# Patient Record
Sex: Female | Born: 1935 | Race: White | Hispanic: No | Smoking: Never smoker
Health system: Southern US, Community
[De-identification: ages and names within clinical notes are randomized; demographics above are authoritative.]

## PROBLEM LIST (undated history)

## (undated) DIAGNOSIS — I5042 Chronic combined systolic (congestive) and diastolic (congestive) heart failure: Secondary | ICD-10-CM

## (undated) DIAGNOSIS — E7800 Pure hypercholesterolemia, unspecified: Secondary | ICD-10-CM

## (undated) DIAGNOSIS — M545 Low back pain, unspecified: Secondary | ICD-10-CM

## (undated) DIAGNOSIS — Z9289 Personal history of other medical treatment: Secondary | ICD-10-CM

## (undated) DIAGNOSIS — Q453 Other congenital malformations of pancreas and pancreatic duct: Secondary | ICD-10-CM

## (undated) DIAGNOSIS — Z8673 Personal history of transient ischemic attack (TIA), and cerebral infarction without residual deficits: Secondary | ICD-10-CM

## (undated) DIAGNOSIS — I2510 Atherosclerotic heart disease of native coronary artery without angina pectoris: Secondary | ICD-10-CM

## (undated) DIAGNOSIS — I10 Essential (primary) hypertension: Secondary | ICD-10-CM

## (undated) DIAGNOSIS — N3946 Mixed incontinence: Secondary | ICD-10-CM

## (undated) DIAGNOSIS — I4891 Unspecified atrial fibrillation: Secondary | ICD-10-CM

## (undated) DIAGNOSIS — I340 Nonrheumatic mitral (valve) insufficiency: Secondary | ICD-10-CM

## (undated) DIAGNOSIS — F411 Generalized anxiety disorder: Secondary | ICD-10-CM

## (undated) DIAGNOSIS — N280 Ischemia and infarction of kidney: Secondary | ICD-10-CM

## (undated) DIAGNOSIS — R040 Epistaxis: Secondary | ICD-10-CM

## (undated) DIAGNOSIS — M810 Age-related osteoporosis without current pathological fracture: Secondary | ICD-10-CM

## (undated) DIAGNOSIS — R413 Other amnesia: Secondary | ICD-10-CM

## (undated) DIAGNOSIS — E119 Type 2 diabetes mellitus without complications: Secondary | ICD-10-CM

## (undated) DIAGNOSIS — K449 Diaphragmatic hernia without obstruction or gangrene: Secondary | ICD-10-CM

## (undated) DIAGNOSIS — D126 Benign neoplasm of colon, unspecified: Secondary | ICD-10-CM

## (undated) DIAGNOSIS — M1990 Unspecified osteoarthritis, unspecified site: Secondary | ICD-10-CM

## (undated) HISTORY — DX: Mixed incontinence: N39.46

## (undated) HISTORY — DX: Other amnesia: R41.3

## (undated) HISTORY — DX: Unspecified osteoarthritis, unspecified site: M19.90

## (undated) HISTORY — DX: Unspecified atrial fibrillation: I48.91

## (undated) HISTORY — DX: Pure hypercholesterolemia, unspecified: E78.00

## (undated) HISTORY — PX: CHOLECYSTECTOMY: SHX55

## (undated) HISTORY — DX: Diaphragmatic hernia without obstruction or gangrene: K44.9

## (undated) HISTORY — DX: Benign neoplasm of colon, unspecified: D12.6

## (undated) HISTORY — DX: Low back pain: M54.5

## (undated) HISTORY — DX: Personal history of other medical treatment: Z92.89

## (undated) HISTORY — PX: OTHER SURGICAL HISTORY: SHX169

## (undated) HISTORY — DX: Type 2 diabetes mellitus without complications: E11.9

## (undated) HISTORY — DX: Ischemia and infarction of kidney: N28.0

## (undated) HISTORY — DX: Generalized anxiety disorder: F41.1

## (undated) HISTORY — DX: Low back pain, unspecified: M54.50

## (undated) HISTORY — DX: Essential (primary) hypertension: I10

## (undated) HISTORY — DX: Age-related osteoporosis without current pathological fracture: M81.0

## (undated) HISTORY — DX: Personal history of transient ischemic attack (TIA), and cerebral infarction without residual deficits: Z86.73

---

## 1998-05-26 ENCOUNTER — Ambulatory Visit (HOSPITAL_COMMUNITY): Admission: RE | Admit: 1998-05-26 | Discharge: 1998-05-26 | Payer: Self-pay | Admitting: Internal Medicine

## 1998-05-26 ENCOUNTER — Encounter: Payer: Self-pay | Admitting: Internal Medicine

## 1999-02-02 ENCOUNTER — Encounter: Payer: Self-pay | Admitting: Internal Medicine

## 1999-02-02 ENCOUNTER — Ambulatory Visit (HOSPITAL_COMMUNITY): Admission: RE | Admit: 1999-02-02 | Discharge: 1999-02-02 | Payer: Self-pay | Admitting: Internal Medicine

## 1999-08-18 ENCOUNTER — Ambulatory Visit: Admission: RE | Admit: 1999-08-18 | Discharge: 1999-08-18 | Payer: Self-pay | Admitting: Internal Medicine

## 2000-02-03 ENCOUNTER — Ambulatory Visit: Admission: RE | Admit: 2000-02-03 | Discharge: 2000-02-03 | Payer: Self-pay | Admitting: Internal Medicine

## 2000-03-17 ENCOUNTER — Ambulatory Visit (HOSPITAL_COMMUNITY): Admission: RE | Admit: 2000-03-17 | Discharge: 2000-03-17 | Payer: Self-pay | Admitting: Internal Medicine

## 2000-04-08 ENCOUNTER — Encounter: Payer: Self-pay | Admitting: Pulmonary Disease

## 2000-04-08 ENCOUNTER — Encounter: Admission: RE | Admit: 2000-04-08 | Discharge: 2000-04-08 | Payer: Self-pay | Admitting: Pulmonary Disease

## 2001-05-11 ENCOUNTER — Encounter: Payer: Self-pay | Admitting: Pulmonary Disease

## 2001-05-11 ENCOUNTER — Ambulatory Visit (HOSPITAL_COMMUNITY): Admission: RE | Admit: 2001-05-11 | Discharge: 2001-05-11 | Payer: Self-pay | Admitting: Pulmonary Disease

## 2002-02-28 ENCOUNTER — Ambulatory Visit (HOSPITAL_COMMUNITY): Admission: RE | Admit: 2002-02-28 | Discharge: 2002-02-28 | Payer: Self-pay | Admitting: Pulmonary Disease

## 2002-02-28 ENCOUNTER — Encounter: Payer: Self-pay | Admitting: Pulmonary Disease

## 2002-03-20 ENCOUNTER — Ambulatory Visit (HOSPITAL_COMMUNITY): Admission: RE | Admit: 2002-03-20 | Discharge: 2002-03-20 | Payer: Self-pay | Admitting: Gastroenterology

## 2002-03-20 ENCOUNTER — Encounter: Payer: Self-pay | Admitting: Gastroenterology

## 2002-11-18 ENCOUNTER — Ambulatory Visit (HOSPITAL_COMMUNITY): Admission: RE | Admit: 2002-11-18 | Discharge: 2002-11-18 | Payer: Self-pay | Admitting: Gastroenterology

## 2003-05-21 ENCOUNTER — Ambulatory Visit (HOSPITAL_COMMUNITY): Admission: RE | Admit: 2003-05-21 | Discharge: 2003-05-21 | Payer: Self-pay | Admitting: Orthopedic Surgery

## 2003-08-11 ENCOUNTER — Inpatient Hospital Stay (HOSPITAL_COMMUNITY): Admission: AD | Admit: 2003-08-11 | Discharge: 2003-08-14 | Payer: Self-pay | Admitting: Internal Medicine

## 2003-10-02 ENCOUNTER — Ambulatory Visit (HOSPITAL_BASED_OUTPATIENT_CLINIC_OR_DEPARTMENT_OTHER): Admission: RE | Admit: 2003-10-02 | Discharge: 2003-10-02 | Payer: Self-pay | Admitting: Family Medicine

## 2003-10-10 ENCOUNTER — Ambulatory Visit: Payer: Self-pay | Admitting: Internal Medicine

## 2003-11-27 ENCOUNTER — Ambulatory Visit: Payer: Self-pay | Admitting: Internal Medicine

## 2003-12-15 ENCOUNTER — Ambulatory Visit: Payer: Self-pay | Admitting: Cardiology

## 2004-01-08 ENCOUNTER — Ambulatory Visit: Payer: Self-pay | Admitting: *Deleted

## 2004-01-23 ENCOUNTER — Ambulatory Visit: Payer: Self-pay | Admitting: Pulmonary Disease

## 2004-02-05 ENCOUNTER — Ambulatory Visit: Payer: Self-pay | Admitting: Cardiology

## 2004-02-20 ENCOUNTER — Ambulatory Visit: Payer: Self-pay | Admitting: Cardiology

## 2004-02-20 ENCOUNTER — Ambulatory Visit: Payer: Self-pay | Admitting: Internal Medicine

## 2004-02-26 ENCOUNTER — Ambulatory Visit: Payer: Self-pay | Admitting: Internal Medicine

## 2004-03-10 ENCOUNTER — Ambulatory Visit: Payer: Self-pay | Admitting: Internal Medicine

## 2004-03-19 ENCOUNTER — Ambulatory Visit: Payer: Self-pay | Admitting: Internal Medicine

## 2004-04-02 ENCOUNTER — Ambulatory Visit: Payer: Self-pay | Admitting: Cardiology

## 2004-04-16 ENCOUNTER — Ambulatory Visit: Payer: Self-pay | Admitting: Internal Medicine

## 2004-05-07 ENCOUNTER — Ambulatory Visit: Payer: Self-pay | Admitting: Internal Medicine

## 2004-06-04 ENCOUNTER — Ambulatory Visit: Payer: Self-pay | Admitting: Cardiology

## 2004-06-25 ENCOUNTER — Ambulatory Visit: Payer: Self-pay | Admitting: Cardiology

## 2004-06-29 ENCOUNTER — Ambulatory Visit: Payer: Self-pay | Admitting: Pulmonary Disease

## 2004-07-09 ENCOUNTER — Ambulatory Visit: Payer: Self-pay | Admitting: Cardiology

## 2004-07-30 ENCOUNTER — Ambulatory Visit: Payer: Self-pay | Admitting: Cardiology

## 2004-08-19 ENCOUNTER — Ambulatory Visit: Payer: Self-pay | Admitting: Internal Medicine

## 2004-08-27 ENCOUNTER — Ambulatory Visit: Payer: Self-pay

## 2004-09-08 ENCOUNTER — Ambulatory Visit: Payer: Self-pay | Admitting: *Deleted

## 2004-09-17 ENCOUNTER — Ambulatory Visit: Payer: Self-pay | Admitting: Internal Medicine

## 2004-09-17 ENCOUNTER — Ambulatory Visit: Payer: Self-pay | Admitting: Cardiology

## 2004-09-30 ENCOUNTER — Ambulatory Visit: Payer: Self-pay | Admitting: Cardiology

## 2004-10-08 ENCOUNTER — Ambulatory Visit: Payer: Self-pay | Admitting: Internal Medicine

## 2004-10-11 ENCOUNTER — Ambulatory Visit: Payer: Self-pay | Admitting: Pulmonary Disease

## 2004-10-21 ENCOUNTER — Ambulatory Visit: Payer: Self-pay | Admitting: Cardiology

## 2004-10-26 ENCOUNTER — Ambulatory Visit: Payer: Self-pay | Admitting: Pulmonary Disease

## 2004-11-04 ENCOUNTER — Ambulatory Visit: Payer: Self-pay | Admitting: Cardiology

## 2004-12-07 ENCOUNTER — Ambulatory Visit: Payer: Self-pay | Admitting: Pulmonary Disease

## 2004-12-22 ENCOUNTER — Ambulatory Visit: Payer: Self-pay | Admitting: Internal Medicine

## 2004-12-30 ENCOUNTER — Ambulatory Visit: Payer: Self-pay | Admitting: Cardiology

## 2005-02-01 ENCOUNTER — Ambulatory Visit: Payer: Self-pay | Admitting: *Deleted

## 2005-03-01 ENCOUNTER — Ambulatory Visit: Payer: Self-pay | Admitting: Cardiovascular Disease

## 2005-03-09 ENCOUNTER — Ambulatory Visit: Payer: Self-pay | Admitting: Pulmonary Disease

## 2005-03-29 ENCOUNTER — Ambulatory Visit: Payer: Self-pay | Admitting: Cardiology

## 2005-04-12 ENCOUNTER — Ambulatory Visit: Payer: Self-pay | Admitting: Cardiology

## 2005-04-26 ENCOUNTER — Ambulatory Visit: Payer: Self-pay | Admitting: Cardiology

## 2005-05-24 ENCOUNTER — Ambulatory Visit: Payer: Self-pay | Admitting: Cardiology

## 2005-06-22 ENCOUNTER — Ambulatory Visit: Payer: Self-pay | Admitting: Pulmonary Disease

## 2005-06-23 ENCOUNTER — Ambulatory Visit: Payer: Self-pay | Admitting: Pulmonary Disease

## 2005-07-21 ENCOUNTER — Ambulatory Visit: Payer: Self-pay | Admitting: Cardiology

## 2005-08-09 ENCOUNTER — Ambulatory Visit: Payer: Self-pay | Admitting: Cardiology

## 2005-08-23 ENCOUNTER — Ambulatory Visit: Payer: Self-pay | Admitting: Cardiology

## 2005-09-29 ENCOUNTER — Ambulatory Visit: Payer: Self-pay | Admitting: Cardiology

## 2005-10-14 ENCOUNTER — Ambulatory Visit: Payer: Self-pay | Admitting: Cardiovascular Disease

## 2005-11-01 ENCOUNTER — Ambulatory Visit: Payer: Self-pay | Admitting: Pulmonary Disease

## 2005-11-21 ENCOUNTER — Ambulatory Visit: Payer: Self-pay | Admitting: Cardiovascular Disease

## 2005-12-12 ENCOUNTER — Ambulatory Visit: Payer: Self-pay | Admitting: Internal Medicine

## 2005-12-28 ENCOUNTER — Ambulatory Visit: Payer: Self-pay | Admitting: *Deleted

## 2006-01-09 ENCOUNTER — Ambulatory Visit: Payer: Self-pay | Admitting: Cardiovascular Disease

## 2006-01-30 ENCOUNTER — Ambulatory Visit: Payer: Self-pay | Admitting: Pulmonary Disease

## 2006-01-30 LAB — CONVERTED CEMR LAB
ALT: 21 units/L (ref 0–40)
AST: 27 units/L (ref 0–37)
Alkaline Phosphatase: 72 units/L (ref 39–117)
BUN: 7 mg/dL (ref 6–23)
Calcium: 9.7 mg/dL (ref 8.4–10.5)
GFR calc non Af Amer: 65 mL/min
Glucose, Bld: 211 mg/dL — ABNORMAL HIGH (ref 70–99)
Hgb A1c MFr Bld: 8.4 % — ABNORMAL HIGH (ref 4.6–6.0)
Potassium: 4.4 meq/L (ref 3.5–5.1)
Total Protein: 6.9 g/dL (ref 6.0–8.3)

## 2006-02-02 ENCOUNTER — Ambulatory Visit: Payer: Self-pay | Admitting: Internal Medicine

## 2006-02-02 ENCOUNTER — Ambulatory Visit: Payer: Self-pay | Admitting: Cardiology

## 2006-02-06 ENCOUNTER — Ambulatory Visit: Payer: Self-pay | Admitting: Internal Medicine

## 2006-02-27 ENCOUNTER — Ambulatory Visit: Payer: Self-pay | Admitting: Internal Medicine

## 2006-03-02 ENCOUNTER — Ambulatory Visit: Payer: Self-pay | Admitting: Pulmonary Disease

## 2006-03-14 ENCOUNTER — Ambulatory Visit: Payer: Self-pay | Admitting: Cardiology

## 2006-03-28 ENCOUNTER — Ambulatory Visit: Payer: Self-pay | Admitting: Cardiology

## 2006-04-25 ENCOUNTER — Ambulatory Visit: Payer: Self-pay | Admitting: *Deleted

## 2006-05-23 ENCOUNTER — Ambulatory Visit: Payer: Self-pay | Admitting: Cardiology

## 2006-06-02 ENCOUNTER — Ambulatory Visit: Payer: Self-pay | Admitting: Internal Medicine

## 2006-06-29 ENCOUNTER — Ambulatory Visit: Payer: Self-pay | Admitting: Cardiology

## 2006-07-19 ENCOUNTER — Ambulatory Visit: Payer: Self-pay | Admitting: Cardiology

## 2006-07-24 ENCOUNTER — Ambulatory Visit: Payer: Self-pay | Admitting: Pulmonary Disease

## 2006-07-24 LAB — CONVERTED CEMR LAB
Creatinine, Ser: 0.6 mg/dL (ref 0.4–1.2)
Hgb A1c MFr Bld: 8.5 % — ABNORMAL HIGH (ref 4.6–6.0)
Potassium: 4.8 meq/L (ref 3.5–5.1)
Sodium: 141 meq/L (ref 135–145)

## 2006-08-14 ENCOUNTER — Ambulatory Visit: Payer: Self-pay | Admitting: Internal Medicine

## 2006-09-04 ENCOUNTER — Ambulatory Visit: Payer: Self-pay | Admitting: Cardiology

## 2006-10-12 ENCOUNTER — Ambulatory Visit: Payer: Self-pay | Admitting: Cardiology

## 2006-10-27 ENCOUNTER — Ambulatory Visit: Payer: Self-pay | Admitting: Cardiology

## 2006-11-14 ENCOUNTER — Ambulatory Visit: Payer: Self-pay | Admitting: Cardiology

## 2006-12-12 ENCOUNTER — Ambulatory Visit: Payer: Self-pay | Admitting: Cardiology

## 2006-12-26 ENCOUNTER — Ambulatory Visit: Payer: Self-pay | Admitting: Internal Medicine

## 2007-01-18 ENCOUNTER — Ambulatory Visit: Payer: Self-pay | Admitting: Internal Medicine

## 2007-01-23 DIAGNOSIS — I10 Essential (primary) hypertension: Secondary | ICD-10-CM

## 2007-01-24 ENCOUNTER — Ambulatory Visit: Payer: Self-pay | Admitting: Pulmonary Disease

## 2007-02-11 LAB — CONVERTED CEMR LAB
AST: 21 units/L (ref 0–37)
Bilirubin, Direct: 0.2 mg/dL (ref 0.0–0.3)
Chloride: 102 meq/L (ref 96–112)
Creatinine, Ser: 0.7 mg/dL (ref 0.4–1.2)
Eosinophils Relative: 1.7 % (ref 0.0–5.0)
Glucose, Bld: 148 mg/dL — ABNORMAL HIGH (ref 70–99)
Hgb A1c MFr Bld: 7.1 % — ABNORMAL HIGH (ref 4.6–6.0)
Lymphocytes Relative: 18.2 % (ref 12.0–46.0)
MCHC: 35.5 g/dL (ref 30.0–36.0)
Monocytes Absolute: 0.6 10*3/uL (ref 0.2–0.7)
Neutrophils Relative %: 69.6 % (ref 43.0–77.0)
Potassium: 3.5 meq/L (ref 3.5–5.1)
Pro B Natriuretic peptide (BNP): 332 pg/mL — ABNORMAL HIGH (ref 0.0–100.0)
RBC: 3.89 M/uL (ref 3.87–5.11)
RDW: 12.8 % (ref 11.5–14.6)
Sodium: 140 meq/L (ref 135–145)
Total Bilirubin: 0.9 mg/dL (ref 0.3–1.2)
Total Protein: 6.8 g/dL (ref 6.0–8.3)

## 2007-02-14 ENCOUNTER — Telehealth: Payer: Self-pay | Admitting: Pulmonary Disease

## 2007-02-20 ENCOUNTER — Telehealth (INDEPENDENT_AMBULATORY_CARE_PROVIDER_SITE_OTHER): Payer: Self-pay | Admitting: *Deleted

## 2007-02-21 ENCOUNTER — Ambulatory Visit: Payer: Self-pay | Admitting: Pulmonary Disease

## 2007-02-21 ENCOUNTER — Encounter: Payer: Self-pay | Admitting: Adult Health

## 2007-02-21 DIAGNOSIS — N3000 Acute cystitis without hematuria: Secondary | ICD-10-CM

## 2007-02-21 LAB — CONVERTED CEMR LAB
Crystals: NEGATIVE
Mucus, UA: NEGATIVE
Nitrite: POSITIVE — AB
Urine Glucose: NEGATIVE mg/dL
Urobilinogen, UA: 0.2 (ref 0.0–1.0)

## 2007-02-27 ENCOUNTER — Ambulatory Visit: Payer: Self-pay | Admitting: Cardiology

## 2007-03-27 ENCOUNTER — Ambulatory Visit: Payer: Self-pay | Admitting: Cardiology

## 2007-04-13 ENCOUNTER — Ambulatory Visit: Payer: Self-pay | Admitting: Cardiology

## 2007-04-19 ENCOUNTER — Ambulatory Visit: Payer: Self-pay | Admitting: Internal Medicine

## 2007-04-23 ENCOUNTER — Telehealth (INDEPENDENT_AMBULATORY_CARE_PROVIDER_SITE_OTHER): Payer: Self-pay | Admitting: *Deleted

## 2007-05-03 ENCOUNTER — Ambulatory Visit: Payer: Self-pay | Admitting: Internal Medicine

## 2007-05-22 ENCOUNTER — Ambulatory Visit: Payer: Self-pay | Admitting: Internal Medicine

## 2007-05-31 ENCOUNTER — Ambulatory Visit: Payer: Self-pay | Admitting: Internal Medicine

## 2007-06-27 ENCOUNTER — Ambulatory Visit: Payer: Self-pay | Admitting: Internal Medicine

## 2007-07-06 ENCOUNTER — Ambulatory Visit: Payer: Self-pay | Admitting: Cardiology

## 2007-07-23 ENCOUNTER — Ambulatory Visit: Payer: Self-pay | Admitting: Cardiology

## 2007-07-24 ENCOUNTER — Ambulatory Visit: Payer: Self-pay | Admitting: Pulmonary Disease

## 2007-07-25 ENCOUNTER — Ambulatory Visit: Payer: Self-pay | Admitting: Pulmonary Disease

## 2007-07-29 DIAGNOSIS — R413 Other amnesia: Secondary | ICD-10-CM

## 2007-07-30 LAB — CONVERTED CEMR LAB
ALT: 20 units/L (ref 0–35)
AST: 24 units/L (ref 0–37)
Basophils Absolute: 0 10*3/uL (ref 0.0–0.1)
Basophils Relative: 0.8 % (ref 0.0–1.0)
Bilirubin, Direct: 0.2 mg/dL (ref 0.0–0.3)
CO2: 31 meq/L (ref 19–32)
Chloride: 107 meq/L (ref 96–112)
Cholesterol: 165 mg/dL (ref 0–200)
LDL Cholesterol: 80 mg/dL (ref 0–99)
Lymphocytes Relative: 27.1 % (ref 12.0–46.0)
MCHC: 34.9 g/dL (ref 30.0–36.0)
Neutrophils Relative %: 58.8 % (ref 43.0–77.0)
RBC: 4.15 M/uL (ref 3.87–5.11)
RDW: 13.6 % (ref 11.5–14.6)
Sodium: 143 meq/L (ref 135–145)
Total Bilirubin: 1.3 mg/dL — ABNORMAL HIGH (ref 0.3–1.2)
Total CHOL/HDL Ratio: 2.8
VLDL: 26 mg/dL (ref 0–40)

## 2007-08-30 ENCOUNTER — Ambulatory Visit: Payer: Self-pay | Admitting: Internal Medicine

## 2007-09-20 ENCOUNTER — Ambulatory Visit: Payer: Self-pay | Admitting: Cardiology

## 2007-10-15 ENCOUNTER — Ambulatory Visit: Payer: Self-pay | Admitting: Internal Medicine

## 2007-11-13 ENCOUNTER — Ambulatory Visit: Payer: Self-pay | Admitting: Internal Medicine

## 2007-12-10 ENCOUNTER — Ambulatory Visit: Payer: Self-pay | Admitting: Pulmonary Disease

## 2007-12-10 ENCOUNTER — Telehealth (INDEPENDENT_AMBULATORY_CARE_PROVIDER_SITE_OTHER): Payer: Self-pay | Admitting: *Deleted

## 2007-12-11 ENCOUNTER — Encounter: Payer: Self-pay | Admitting: Pulmonary Disease

## 2007-12-14 ENCOUNTER — Ambulatory Visit: Payer: Self-pay | Admitting: Internal Medicine

## 2007-12-18 LAB — CONVERTED CEMR LAB
Ketones, ur: NEGATIVE mg/dL
Specific Gravity, Urine: 1.01 (ref 1.000–1.03)
Total Protein, Urine: 100 mg/dL — AB
Urine Glucose: NEGATIVE mg/dL
pH: 6 (ref 5.0–8.0)

## 2008-01-03 ENCOUNTER — Ambulatory Visit: Payer: Self-pay | Admitting: Cardiovascular Disease

## 2008-02-04 ENCOUNTER — Ambulatory Visit: Payer: Self-pay | Admitting: Cardiology

## 2008-02-27 ENCOUNTER — Ambulatory Visit: Payer: Self-pay | Admitting: Cardiology

## 2008-03-18 ENCOUNTER — Ambulatory Visit: Payer: Self-pay | Admitting: Cardiology

## 2008-03-25 ENCOUNTER — Telehealth: Payer: Self-pay | Admitting: Pulmonary Disease

## 2008-04-01 ENCOUNTER — Ambulatory Visit: Payer: Self-pay | Admitting: Cardiovascular Disease

## 2008-04-03 ENCOUNTER — Telehealth (INDEPENDENT_AMBULATORY_CARE_PROVIDER_SITE_OTHER): Payer: Self-pay | Admitting: *Deleted

## 2008-04-03 ENCOUNTER — Ambulatory Visit: Payer: Self-pay | Admitting: Adult Health

## 2008-04-03 DIAGNOSIS — R21 Rash and other nonspecific skin eruption: Secondary | ICD-10-CM

## 2008-04-16 ENCOUNTER — Ambulatory Visit: Payer: Self-pay | Admitting: Pulmonary Disease

## 2008-04-22 ENCOUNTER — Ambulatory Visit: Payer: Self-pay | Admitting: Cardiology

## 2008-04-29 ENCOUNTER — Ambulatory Visit: Payer: Self-pay | Admitting: Cardiology

## 2008-04-29 LAB — CONVERTED CEMR LAB
ALT: 18 units/L (ref 0–35)
Albumin: 3.8 g/dL (ref 3.5–5.2)
BUN: 10 mg/dL (ref 6–23)
Basophils Relative: 0.9 % (ref 0.0–3.0)
Chloride: 107 meq/L (ref 96–112)
Eosinophils Relative: 1.3 % (ref 0.0–5.0)
HCT: 41.7 % (ref 36.0–46.0)
Hgb A1c MFr Bld: 8 % — ABNORMAL HIGH (ref 4.6–6.5)
Lymphs Abs: 1.1 10*3/uL (ref 0.7–4.0)
MCV: 97.1 fL (ref 78.0–100.0)
Monocytes Absolute: 0.5 10*3/uL (ref 0.1–1.0)
Neutro Abs: 3.6 10*3/uL (ref 1.4–7.7)
Platelets: 192 10*3/uL (ref 150.0–400.0)
Potassium: 4.4 meq/L (ref 3.5–5.1)
TSH: 2.4 microintl units/mL (ref 0.35–5.50)
Total Bilirubin: 1.1 mg/dL (ref 0.3–1.2)
Total Protein: 7 g/dL (ref 6.0–8.3)
WBC: 5.3 10*3/uL (ref 4.5–10.5)

## 2008-05-06 ENCOUNTER — Telehealth: Payer: Self-pay | Admitting: Internal Medicine

## 2008-05-09 ENCOUNTER — Encounter: Payer: Self-pay | Admitting: Internal Medicine

## 2008-05-12 ENCOUNTER — Ambulatory Visit: Payer: Self-pay | Admitting: Cardiology

## 2008-05-26 ENCOUNTER — Telehealth (INDEPENDENT_AMBULATORY_CARE_PROVIDER_SITE_OTHER): Payer: Self-pay | Admitting: *Deleted

## 2008-05-27 ENCOUNTER — Ambulatory Visit: Payer: Self-pay | Admitting: Internal Medicine

## 2008-06-10 ENCOUNTER — Encounter: Payer: Self-pay | Admitting: *Deleted

## 2008-06-12 ENCOUNTER — Ambulatory Visit: Payer: Self-pay | Admitting: Internal Medicine

## 2008-07-03 ENCOUNTER — Ambulatory Visit: Payer: Self-pay | Admitting: Internal Medicine

## 2008-07-16 ENCOUNTER — Encounter: Payer: Self-pay | Admitting: *Deleted

## 2008-07-18 ENCOUNTER — Encounter (INDEPENDENT_AMBULATORY_CARE_PROVIDER_SITE_OTHER): Payer: Self-pay | Admitting: Cardiology

## 2008-07-18 ENCOUNTER — Ambulatory Visit: Payer: Self-pay | Admitting: Cardiology

## 2008-07-18 LAB — CONVERTED CEMR LAB
POC INR: 3.8
Prothrombin Time: 23.5 s

## 2008-08-08 ENCOUNTER — Ambulatory Visit: Payer: Self-pay | Admitting: Cardiology

## 2008-08-08 LAB — CONVERTED CEMR LAB: POC INR: 2.2

## 2008-09-05 ENCOUNTER — Telehealth: Payer: Self-pay | Admitting: Adult Health

## 2008-09-05 ENCOUNTER — Ambulatory Visit: Payer: Self-pay | Admitting: Cardiovascular Disease

## 2008-09-05 LAB — CONVERTED CEMR LAB: POC INR: 1.6

## 2008-09-08 ENCOUNTER — Ambulatory Visit: Payer: Self-pay | Admitting: Pulmonary Disease

## 2008-09-08 DIAGNOSIS — K121 Other forms of stomatitis: Secondary | ICD-10-CM | POA: Insufficient documentation

## 2008-09-08 DIAGNOSIS — K1230 Oral mucositis (ulcerative), unspecified: Secondary | ICD-10-CM

## 2008-09-09 ENCOUNTER — Telehealth: Payer: Self-pay | Admitting: Adult Health

## 2008-09-19 ENCOUNTER — Ambulatory Visit: Payer: Self-pay | Admitting: Internal Medicine

## 2008-09-19 LAB — CONVERTED CEMR LAB: POC INR: 2.5

## 2008-10-14 ENCOUNTER — Ambulatory Visit: Payer: Self-pay | Admitting: Pulmonary Disease

## 2008-10-18 LAB — CONVERTED CEMR LAB
AST: 24 units/L (ref 0–37)
Albumin: 4.5 g/dL (ref 3.5–5.2)
Alkaline Phosphatase: 49 units/L (ref 39–117)
Basophils Absolute: 0 10*3/uL (ref 0.0–0.1)
Basophils Relative: 0.6 % (ref 0.0–3.0)
Bilirubin Urine: NEGATIVE
CO2: 30 meq/L (ref 19–32)
Glucose, Bld: 124 mg/dL — ABNORMAL HIGH (ref 70–99)
HCT: 40.7 % (ref 36.0–46.0)
Hemoglobin, Urine: NEGATIVE
Hemoglobin: 14.1 g/dL (ref 12.0–15.0)
Ketones, ur: NEGATIVE mg/dL
Lymphs Abs: 1 10*3/uL (ref 0.7–4.0)
MCHC: 34.5 g/dL (ref 30.0–36.0)
Monocytes Relative: 8.1 % (ref 3.0–12.0)
Neutro Abs: 4 10*3/uL (ref 1.4–7.7)
Potassium: 4.1 meq/L (ref 3.5–5.1)
RBC: 4.24 M/uL (ref 3.87–5.11)
RDW: 13.1 % (ref 11.5–14.6)
Sodium: 138 meq/L (ref 135–145)
TSH: 2.38 microintl units/mL (ref 0.35–5.50)
Total CHOL/HDL Ratio: 2
Total Protein, Urine: NEGATIVE mg/dL
Total Protein: 7.4 g/dL (ref 6.0–8.3)
Urine Glucose: NEGATIVE mg/dL
Urobilinogen, UA: 0.2 (ref 0.0–1.0)
Vit D, 25-Hydroxy: 32 ng/mL (ref 30–89)

## 2008-11-03 ENCOUNTER — Telehealth: Payer: Self-pay | Admitting: Pulmonary Disease

## 2008-11-05 ENCOUNTER — Encounter: Payer: Self-pay | Admitting: *Deleted

## 2008-11-07 ENCOUNTER — Telehealth: Payer: Self-pay | Admitting: Pulmonary Disease

## 2008-11-12 ENCOUNTER — Ambulatory Visit: Payer: Self-pay | Admitting: Cardiology

## 2008-11-12 LAB — CONVERTED CEMR LAB: POC INR: 2

## 2008-12-16 ENCOUNTER — Ambulatory Visit: Payer: Self-pay | Admitting: Cardiovascular Disease

## 2008-12-16 ENCOUNTER — Encounter (INDEPENDENT_AMBULATORY_CARE_PROVIDER_SITE_OTHER): Payer: Self-pay | Admitting: Cardiology

## 2008-12-16 LAB — CONVERTED CEMR LAB: POC INR: 1.6

## 2009-01-05 ENCOUNTER — Telehealth (INDEPENDENT_AMBULATORY_CARE_PROVIDER_SITE_OTHER): Payer: Self-pay | Admitting: *Deleted

## 2009-01-15 ENCOUNTER — Ambulatory Visit: Payer: Self-pay | Admitting: Cardiovascular Disease

## 2009-01-15 LAB — CONVERTED CEMR LAB: POC INR: 2.6

## 2009-01-22 ENCOUNTER — Telehealth: Payer: Self-pay | Admitting: Internal Medicine

## 2009-01-29 ENCOUNTER — Ambulatory Visit: Payer: Self-pay | Admitting: Internal Medicine

## 2009-02-05 ENCOUNTER — Emergency Department (HOSPITAL_COMMUNITY): Admission: EM | Admit: 2009-02-05 | Discharge: 2009-02-05 | Payer: Self-pay | Admitting: Emergency Medicine

## 2009-02-12 ENCOUNTER — Ambulatory Visit: Payer: Self-pay | Admitting: Cardiology

## 2009-02-12 ENCOUNTER — Encounter (INDEPENDENT_AMBULATORY_CARE_PROVIDER_SITE_OTHER): Payer: Self-pay | Admitting: Cardiology

## 2009-03-13 ENCOUNTER — Ambulatory Visit: Payer: Self-pay | Admitting: Cardiology

## 2009-04-13 ENCOUNTER — Ambulatory Visit: Payer: Self-pay | Admitting: Pulmonary Disease

## 2009-04-14 ENCOUNTER — Ambulatory Visit: Payer: Self-pay | Admitting: Internal Medicine

## 2009-04-19 LAB — CONVERTED CEMR LAB
ALT: 16 units/L (ref 0–35)
Alkaline Phosphatase: 66 units/L (ref 39–117)
Basophils Absolute: 0 10*3/uL (ref 0.0–0.1)
Basophils Relative: 0.6 % (ref 0.0–3.0)
Bilirubin, Direct: 0.2 mg/dL (ref 0.0–0.3)
CO2: 30 meq/L (ref 19–32)
Chloride: 102 meq/L (ref 96–112)
Cholesterol: 207 mg/dL — ABNORMAL HIGH (ref 0–200)
Creatinine, Ser: 0.6 mg/dL (ref 0.4–1.2)
Eosinophils Absolute: 0.1 10*3/uL (ref 0.0–0.7)
Hgb A1c MFr Bld: 6.8 % — ABNORMAL HIGH (ref 4.6–6.5)
MCHC: 34.5 g/dL (ref 30.0–36.0)
MCV: 95.8 fL (ref 78.0–100.0)
Monocytes Absolute: 0.4 10*3/uL (ref 0.1–1.0)
Neutro Abs: 4.5 10*3/uL (ref 1.4–7.7)
Neutrophils Relative %: 74.7 % (ref 43.0–77.0)
Potassium: 4.4 meq/L (ref 3.5–5.1)
RBC: 4.1 M/uL (ref 3.87–5.11)
RDW: 14.7 % — ABNORMAL HIGH (ref 11.5–14.6)
Sodium: 141 meq/L (ref 135–145)
Total Bilirubin: 1.2 mg/dL (ref 0.3–1.2)
Total CHOL/HDL Ratio: 2
Total Protein: 6.8 g/dL (ref 6.0–8.3)
VLDL: 20.8 mg/dL (ref 0.0–40.0)

## 2009-04-20 ENCOUNTER — Telehealth (INDEPENDENT_AMBULATORY_CARE_PROVIDER_SITE_OTHER): Payer: Self-pay | Admitting: *Deleted

## 2009-04-24 ENCOUNTER — Ambulatory Visit: Payer: Self-pay | Admitting: Internal Medicine

## 2009-05-13 ENCOUNTER — Ambulatory Visit: Payer: Self-pay | Admitting: Cardiology

## 2009-05-13 LAB — CONVERTED CEMR LAB: POC INR: 2.6

## 2009-05-26 ENCOUNTER — Encounter: Payer: Self-pay | Admitting: Pulmonary Disease

## 2009-06-12 ENCOUNTER — Ambulatory Visit: Payer: Self-pay | Admitting: Cardiovascular Disease

## 2009-06-12 LAB — CONVERTED CEMR LAB: POC INR: 2

## 2009-06-16 ENCOUNTER — Telehealth: Payer: Self-pay | Admitting: Internal Medicine

## 2009-06-18 ENCOUNTER — Telehealth: Payer: Self-pay | Admitting: Internal Medicine

## 2009-06-19 ENCOUNTER — Telehealth: Payer: Self-pay | Admitting: Internal Medicine

## 2009-07-01 ENCOUNTER — Telehealth (INDEPENDENT_AMBULATORY_CARE_PROVIDER_SITE_OTHER): Payer: Self-pay | Admitting: *Deleted

## 2009-07-14 ENCOUNTER — Ambulatory Visit: Payer: Self-pay | Admitting: Internal Medicine

## 2009-07-27 ENCOUNTER — Telehealth: Payer: Self-pay | Admitting: Pulmonary Disease

## 2009-07-28 ENCOUNTER — Ambulatory Visit: Payer: Self-pay | Admitting: Adult Health

## 2009-07-28 LAB — CONVERTED CEMR LAB
Casts: NONE SEEN /lpf
Crystals: NONE SEEN
Squamous Epithelial / LPF: NONE SEEN /lpf

## 2009-08-06 ENCOUNTER — Telehealth (INDEPENDENT_AMBULATORY_CARE_PROVIDER_SITE_OTHER): Payer: Self-pay | Admitting: *Deleted

## 2009-08-06 ENCOUNTER — Ambulatory Visit: Payer: Self-pay | Admitting: Pulmonary Disease

## 2009-08-06 DIAGNOSIS — R630 Anorexia: Secondary | ICD-10-CM

## 2009-08-07 LAB — CONVERTED CEMR LAB
Basophils Absolute: 0 10*3/uL (ref 0.0–0.1)
CO2: 30 meq/L (ref 19–32)
Calcium: 8.6 mg/dL (ref 8.4–10.5)
Creatinine, Ser: 0.7 mg/dL (ref 0.4–1.2)
Eosinophils Absolute: 0.1 10*3/uL (ref 0.0–0.7)
Folate: 17.8 ng/mL
GFR calc non Af Amer: 82.18 mL/min (ref >60)
HCT: 39.3 % (ref 36.0–46.0)
Hemoglobin: 13.4 g/dL (ref 12.0–15.0)
Lymphs Abs: 1.2 10*3/uL (ref 0.7–4.0)
MCHC: 34.2 g/dL (ref 30.0–36.0)
MCV: 97 fL (ref 78.0–100.0)
Monocytes Absolute: 0.5 10*3/uL (ref 0.1–1.0)
Neutro Abs: 3.6 10*3/uL (ref 1.4–7.7)
Platelets: 238 10*3/uL (ref 150.0–400.0)
RDW: 14.5 % (ref 11.5–14.6)
Vitamin B-12: 135 pg/mL — ABNORMAL LOW (ref 211–911)

## 2009-08-13 ENCOUNTER — Ambulatory Visit: Payer: Self-pay | Admitting: Internal Medicine

## 2009-08-13 ENCOUNTER — Ambulatory Visit: Payer: Self-pay | Admitting: Cardiovascular Disease

## 2009-08-13 LAB — CONVERTED CEMR LAB: POC INR: 3.1

## 2009-09-08 ENCOUNTER — Encounter: Payer: Self-pay | Admitting: Pulmonary Disease

## 2009-09-16 ENCOUNTER — Ambulatory Visit: Payer: Self-pay | Admitting: Cardiovascular Disease

## 2009-09-16 LAB — CONVERTED CEMR LAB: POC INR: 3.2

## 2009-09-28 ENCOUNTER — Telehealth (INDEPENDENT_AMBULATORY_CARE_PROVIDER_SITE_OTHER): Payer: Self-pay | Admitting: *Deleted

## 2009-09-30 ENCOUNTER — Ambulatory Visit: Payer: Self-pay | Admitting: Cardiology

## 2009-09-30 LAB — CONVERTED CEMR LAB: POC INR: 3.3

## 2009-10-13 ENCOUNTER — Ambulatory Visit: Payer: Self-pay | Admitting: Pulmonary Disease

## 2009-10-14 ENCOUNTER — Ambulatory Visit: Payer: Self-pay | Admitting: Cardiovascular Disease

## 2009-10-15 LAB — CONVERTED CEMR LAB
BUN: 14 mg/dL (ref 6–23)
CO2: 27 meq/L (ref 19–32)
Chloride: 98 meq/L (ref 96–112)
Hgb A1c MFr Bld: 8.3 % — ABNORMAL HIGH (ref 4.6–6.5)
Potassium: 4.7 meq/L (ref 3.5–5.1)

## 2009-10-23 ENCOUNTER — Telehealth (INDEPENDENT_AMBULATORY_CARE_PROVIDER_SITE_OTHER): Payer: Self-pay | Admitting: *Deleted

## 2009-10-26 ENCOUNTER — Telehealth (INDEPENDENT_AMBULATORY_CARE_PROVIDER_SITE_OTHER): Payer: Self-pay | Admitting: *Deleted

## 2009-11-16 ENCOUNTER — Ambulatory Visit: Payer: Self-pay | Admitting: Internal Medicine

## 2009-11-19 ENCOUNTER — Telehealth (INDEPENDENT_AMBULATORY_CARE_PROVIDER_SITE_OTHER): Payer: Self-pay | Admitting: *Deleted

## 2009-11-27 ENCOUNTER — Ambulatory Visit: Payer: Self-pay | Admitting: Cardiovascular Disease

## 2009-11-27 ENCOUNTER — Inpatient Hospital Stay (HOSPITAL_COMMUNITY): Admission: EM | Admit: 2009-11-27 | Discharge: 2009-11-29 | Payer: Self-pay | Admitting: Emergency Medicine

## 2009-11-28 ENCOUNTER — Encounter (INDEPENDENT_AMBULATORY_CARE_PROVIDER_SITE_OTHER): Payer: Self-pay | Admitting: Neurology

## 2009-11-29 ENCOUNTER — Encounter (INDEPENDENT_AMBULATORY_CARE_PROVIDER_SITE_OTHER): Payer: Self-pay | Admitting: Neurology

## 2009-12-14 ENCOUNTER — Ambulatory Visit: Payer: Self-pay | Admitting: Internal Medicine

## 2009-12-14 LAB — CONVERTED CEMR LAB: POC INR: 3.1

## 2009-12-28 ENCOUNTER — Ambulatory Visit: Payer: Self-pay | Admitting: Cardiology

## 2010-01-12 ENCOUNTER — Telehealth (INDEPENDENT_AMBULATORY_CARE_PROVIDER_SITE_OTHER): Payer: Self-pay | Admitting: *Deleted

## 2010-01-13 LAB — CONVERTED CEMR LAB: POC INR: 2.4

## 2010-02-02 ENCOUNTER — Encounter: Payer: Self-pay | Admitting: Pulmonary Disease

## 2010-02-09 NOTE — Medication Information (Signed)
Summary: ROV.MP  Anticoagulant Therapy  Managed by: Bethena Midget, RN, BSN Referring MD: Dietrich Pates Supervising MD: Excell Seltzer MD, Casimiro Needle Indication 1: Atrial Fibrillation (ICD-427.31) Lab Used: [facility]  Site: [address] INR POC 2.6 INR RANGE 2 - 3  Dietary changes: no    Health status changes: no    Bleeding/hemorrhagic complications: no    Recent/future hospitalizations: no    Any changes in medication regimen? no    Recent/future dental: no  Any missed doses?: no       Is patient compliant with meds? yes       Allergies: 1)  ! Sulfa 2)  ! Lyrica (Pregabalin) 3)  ! Zithromax (Azithromycin) 4)  Sulfamethoxazole (Sulfamethoxazole)  Anticoagulation Management History:      The patient is taking warfarin and comes in today for a routine follow up visit.  Positive risk factors for bleeding include an age of 75 years or older and presence of serious comorbidities.  The bleeding index is 'intermediate risk'.  Positive CHADS2 values include History of CHF, History of HTN, Age > 76 years old, and History of Diabetes.  The start date was 04/01/1998.  Her last INR was 5.9 ratio.  Anticoagulation responsible provider: Excell Seltzer MD, Casimiro Needle.  INR POC: 2.6.  Cuvette Lot#: [id number].  Exp: 02/2010.    Anticoagulation Management Assessment/Plan:      The patient's current anticoagulation dose is Coumadin 5 mg tabs: Take as directed by coumadin clinic..  The target INR is 2.0-3.0.  The next INR is due 02/12/2009.  Anticoagulation instructions were given to patient.  Results were reviewed/authorized by Bethena Midget, RN, BSN.  She was notified by Lew Dawes, PharmD Candidate.         Prior Anticoagulation Instructions: INR 1.6  Take 1.5 tabs today, then increase to 0.5 tab each Monday and Friday and 1 tab on all other days.   Recheck on 01/06/2009 at 12 noon.    Current Anticoagulation Instructions: INR 2.6  Take 1 tablet everyday except 1/2 tablet on Mondays  and Fridays. Recheck in 4 weeks.

## 2010-02-09 NOTE — Medication Information (Signed)
Summary: ccr  Anticoagulant Therapy  Managed by: Weston Brass, PharmD Referring MD: Dietrich Pates PCP: Kriste Basque, MD Supervising MD: Clifton James MD, Cristal Deer Indication 1: Atrial Fibrillation (ICD-427.31) Lab Used: [facility] [place] Site: [address] INR POC 3.2 INR RANGE 2 - 3  Dietary changes: no    Health status changes: no    Bleeding/hemorrhagic complications: no    Recent/future hospitalizations: no    Any changes in medication regimen? no    Recent/future dental: no  Any missed doses?: no       Is patient compliant with meds? yes       Allergies: 1)  ! Sulfa 2)  ! Lyrica (Pregabalin) 3)  ! Zithromax (Azithromycin)  Anticoagulation Management History:      The patient is taking warfarin and comes in today for a routine follow up visit.  Positive risk factors for bleeding include an age of 75 years or older and presence of serious comorbidities.  The bleeding index is 'intermediate risk'.  Positive CHADS2 values include History of CHF, History of HTN, Age > 7 years old, and History of Diabetes.  The start date was 04/01/1998.  Her last INR was 5.9 ratio.  Anticoagulation responsible provider: Clifton James MD, Cristal Deer.  INR POC: 3.2.  Cuvette Lot#: [id number].  Exp: 10/2010.    Anticoagulation Management Assessment/Plan:      The patient's current anticoagulation dose is Coumadin 5 mg tabs: Take as directed by coumadin clinic..  The target INR is 2.0-3.0.  The next INR is due 09/30/2009.  Anticoagulation instructions were given to patient.  Results were reviewed/authorized by Weston Brass, PharmD.  She was notified by Eda Keys.         Prior Anticoagulation Instructions: INR 3.1  Take 1/2 tablet today then resume same dose of 1 tablet every day except 1/2 tablet on Monday.   Current Anticoagulation Instructions: INR 3.2 Hold dose today. Change friday dose 1/2 tablet and keep monday at 1/2 tablet. Continue 1 tablet all other days. We'll check again in 2  weeks.

## 2010-02-09 NOTE — Assessment & Plan Note (Signed)
Summary: 6 months/apc   Primary Care Provider:  Kriste Basque, MD  CC:  6 month ROV & review of mult medical problems....  History of Present Illness: 77 y/o WF here for a follow up visit... she has mult medical problems as noted...    ~  October 14, 2008:  she is c/o LBP and wants XRays... she has seen DrRendall in the past w/ right shoulder surg 3/09... BP has been well controlled, and sugars at home have been in the 80-120 range... no other complaints or concerns...   ~  April 13, 2009:  she reports that she stopped her Lanoxin on her own & doesn't want to restart this med- hx chr AFib on rate control strategy + coumadin & followed by [employer]... denies CP, palpit, change in DOE, edema, etc...  BP is good;  Chol numbers are not as good on Simva40 ?compliance & reminded to take regularly + diet efforts...  DM control looks OK... her CC is her DJD- right shoulder pain w/ eval of back & shoulder as below> she requests second opinion Ortho consult...    Current Problem List:  HYPERTENSION, CHF, & Hx of ATRIAL FIBRILLATION - stable on Rx w/ COUMADIN (followed in clinic), & ATENOLOL 50mg Bid... her BP=128/74 and she feels well- denies HA, fatigue, visual changes, CP, palipit, dizziness, syncope, dyspnea, edema, etc...  ~  4/11:  she reports that she stopped her Lanoxin 0.125 on her own & doesn't want to restart.  HYPERCHOLESTEROLEMIA - on diet + SIMVASTATIN 40mg /d...  ~  FLP 2007 showed TChol 160, TG 143, HDL 55, LDL 76  ~  FLP 7/09 showed TChol 165, TG 129, HDL 60, LDL 80  ~  FLP 10/10 showed TChol 175, TG 68, HDL 80, LDL 82  ~  FLP 4/11 showed TChol 207, TG 104, HDL 84, LDL 111  DM - now on METFORMIN 500mg - 2tabsBid, GLIPZIDE 10mg Bid (not taking Januvia due to $$)... she states doing better on diet and BS at home 80-120 range now...  ~  labs 7/08 showed FBS= 136 and HgA1c=8.5.Marland KitchenMarland Kitchen she never increased the Metformin to 2 Bid...  ~  labs 1/09 showed BS= 148, HgA1c= 7.1.Marland KitchenMarland Kitchen continue meds (1 Bid) + better  diet...  ~  labs 7/09 showed BS= 168, HgA1c= 7.6.Marland KitchenMarland Kitchen incr Metform 2Bid, contin Glip 10Bid & Januv...  ~  she stopped the Januvia due to cost...  ~  labs 4/10 showed BS= 185, Aic= 8.0.Marland KitchenMarland Kitchen reminder to take meds regularly.  ~  labs 10/10 on Metform2Bid+GlipBid showed BS= 124, A1c= 6.7  ~  labs 4/11 showed BS= 162, A1c= 6.8.[name]. rec> better diet, take meds regularly.  HIATAL HERNIA (ICD-553.3) - last EGD by DrStark was 3/03 and showed a HH, reflux...  COLONIC POLYPS (ICD-211.3) - last colonoscopy 3/03 by DrStark showed divertics and 5mm polyp (no path avail).  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 pt reports that there is nothing they can do to help...  ~  she has had several UTI's... then perineal rash & saw GYN= neg PAP & Rx yeast infection- resolved.  DEGENERATIVE JOINT DISEASE (ICD-715.90) BACK PAIN, LUMBAR (ICD-724.2) - XRays showed scoliosis, facet degen arthritis, & osteopenia...  she has used ROBAXIN Prn...  SHOULDER PAIN, RIGHT (ICD-719.41) - prev eval & Rx by DrRendall... s/p right shoulder surg 3/09... XRays showed calcium deposit, rotator cuff prob, & she states not much better w/ TRAMADOL Rx...  OSTEOPOROSIS (ICD-733.00)  ~  labs 10/10 w/ Vit D level = 32... rec> start Vit D OTC 1000 u daily.  MEMORY LOSS (ICD-780.93) - concern for her memory expressed 7/09 OV- tried Aricept but she stopped it- "no different".  ANXIETY (ICD-300.00) - stress in family... 2 y/o granddau w/ seizures...   Allergies: 1)  ! Sulfa 2)  ! Lyrica (Pregabalin) 3)  ! Zithromax (Azithromycin)  Comments:  Nurse/Medical Assistant: The patient's medications and allergies were reviewed with the patient and were updated in the Medication and Allergy Lists.  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 noncontributory  Social History: Reviewed history from 06/11/2008 and no changes required. Tobacco Use - No.  Alcohol Use - no Drug Use - no  Review of Systems      See HPI  The patient denies anorexia, fever, weight loss, weight gain, vision loss, decreased hearing, hoarseness, chest pain, syncope, dyspnea on exertion, peripheral edema, prolonged cough, headaches, hemoptysis, abdominal pain, melena, hematochezia, severe indigestion/heartburn, hematuria, incontinence, muscle weakness, suspicious skin lesions, transient blindness, difficulty walking, depression, unusual weight change, abnormal bleeding, enlarged lymph nodes, and angioedema.    Vital Signs:  Patient profile:   77 year old female Height:      65 inches Weight:      133.50 pounds BMI:     22.30 O2 Sat:      98 % on Room air Temp:     98.1 degrees F oral Pulse rate:   64 / minute BP sitting:   128 / 74  (left arm) Cuff size:   regular  Vitals Entered By: Randell Loop CMA (April 13, 2009 11:01 AM)  O2 Sat at Rest %:  98 O2 Flow:  Room air CC: 6 month ROV & review of mult medical problems... Is Patient Diabetic? Yes Pain Assessment Patient in pain? no      Comments MEDS UPDATED TODAY   Physical Exam  Additional Exam:  WD, WN, 77 y/o WF in NAD...  GENERAL:  Alert & oriented; pleasant & cooperative... HEENT:  [place]/AT, EOM-wnl, PERRLA, EACs-clear, TMs-wnl, NOSE-clear, THROAT-clear &  wnl. NECK:  Supple w/ fairROM; no JVD; normal carotid impulses w/o bruits; no thyromegaly or nodules palpated; no lymphadenopathy. CHEST:  Clear to P & A; without wheezes/ rales/ or rhonchi. HEART:  Iregular Rhythm= AFib; without murmurs/ rubs/ or gallops heard... ABDOMEN:  Soft & nontender; normal bowel sounds; no organomegaly or masses detected. EXT: without deformities, mild arthritic changes; no varicose veins/ venous insuffic/ or edema. NEURO:  CN's intact;  no focal neuro deficits found... DERM:  No lesions noted; no rash etc...    MISC. Report  Procedure date:  04/13/2009  Findings:      DATA REVIEWED:  FLP, CMet, CBC, TSH, A1c... SN   Impression & Recommendations:  Problem # 1:  HYPERTENSION (ICD-401.9) Controlled-  continue Atenolol... Her updated medication list for this problem includes:    Atenolol 50 Mg Tabs (Atenolol) .[name]... Take 1 tablet by mouth twice a day  Orders: TLB-Lipid Panel (80061-LIPID) TLB-BMP (Basic Metabolic Panel-BMET) (80048-METABOL) TLB-Hepatic/Liver Function Pnl (80076-HEPATIC) TLB-CBC Platelet - w/Differential (  85025-CBCD) TLB-TSH (Thyroid Stimulating Hormone) (84443-TSH) TLB-A1C / Hgb A1C (Glycohemoglobin) (83036-A1C)  Problem # 2:  ATRIAL FIBRILLATION, HX OF (ICD-V12.59) Followed by DrTaylor-  continue rate control strategy & Coumadin via CC...  Problem # 3:  HYPERCHOLESTEROLEMIA (ICD-272.0) On diet + Simva40... FLP not as good- reminder to take med regularly & better diet. Her updated medication list for this problem includes:    Simvastatin 40 Mg Tabs (Simvastatin) .[name]... 1 tab daily at bedtime  Problem # 4:  DM (ICD-250.00) Doing satis w/ these meds and diet Rx. Her updated medication list for this problem includes:    Metformin Hcl 500 Mg Tabs (Metformin hcl) .[name]... Take 2  tablets  by mouth two times a day    Glipizide 10 Mg Tabs (Glipizide) .[name]... Take 1 tablet by mouth twice a day  Problem # 5:  DEGENERATIVE JOINT DISEASE  (ICD-715.90) She requests Ortho second opinion... we will refer for eval. Her updated medication list for this problem includes:    Tramadol Hcl 50 Mg Tabs (Tramadol hcl) .[name]... Take 1 tab by mouth up to every 6 h as needed for pain...  Problem # 6:  OTHER MEDICAL PROBLEMS AS NOTED>>>  Complete Medication List: 1)  Coumadin 5 Mg Tabs (Warfarin sodium) .... Take as directed by coumadin clinic. 2)  Atenolol 50 Mg Tabs (Atenolol) .... Take 1 tablet by mouth twice a day 3)  Simvastatin 40 Mg Tabs (Simvastatin) .[name].. 1 tab daily at bedtime 4)  Metformin Hcl 500 Mg Tabs (Metformin hcl) .... Take 2  tablets  by mouth two times a day 5)  Glipizide 10 Mg Tabs (Glipizide) .... Take 1 tablet by mouth twice a day 6)  Tramadol Hcl 50 Mg Tabs (Tramadol hcl) .... Take 1 tab by mouth up to every 6 h as needed for pain.Marland KitchenMarland Kitchen 7)  Robaxin 500 Mg Tabs (Methocarbamol) .... Take 1 tab by mouth up to 4 times daily as needed for muscle spasm... 8)  Caltrate 600+d 600-400 Mg-unit Tabs (Calcium carbonate-vitamin d) .... Take 1 tab by mouth two times a day for bones.Marland KitchenMarland Kitchen 9)  Womens Multivitamin Plus Tabs (Multiple vitamins-minerals) .... T 1 tab by mouth once daily... 10)  Vitamin D 1000 Unit Tabs (Cholecalciferol) .... Take 1 cap by mouth once daily... 11)  Onetouch Test Strp (Glucose blood) .... Use as directed  Other Orders: Orthopedic Referral (Ortho)  Patient Instructions: 1)  Today we updated your med list- see below.... 2)  We also did your follow up FASTING blood work...  please call the "phone tree" in a few days for your lab results.Marland KitchenMarland Kitchen 3)  We will arrange for an orthopedic second opinion regarding your right shoulder... 4)  Call for any problems.Marland KitchenMarland Kitchen 5)  Please schedule a follow-up appointment in 6 months.

## 2010-02-09 NOTE — Medication Information (Signed)
Summary: rov/cb  Anticoagulant Therapy  Managed by: Elaina Pattee, PharmD Referring MD: Dietrich Pates PCP: Kriste Basque, MD Supervising MD: Graciela Husbands MD, Viviann Spare Indication 1: Atrial Fibrillation (ICD-427.31) Lab Used: [facility] [place] Site: [address] INR POC 2.4 INR RANGE 2 - 3  Dietary changes: no    Health status changes: yes       Details: Glucose has improved.  Bleeding/hemorrhagic complications: no    Recent/future hospitalizations: no    Any changes in medication regimen? no    Recent/future dental: no  Any missed doses?: no       Is patient compliant with meds? yes       Allergies: 1)  ! Sulfa 2)  ! Lyrica (Pregabalin) 3)  ! Zithromax (Azithromycin)  Anticoagulation Management History:      The patient is taking warfarin and comes in today for a routine follow up visit.  Positive risk factors for bleeding include an age of 40 years or older and presence of serious comorbidities.  The bleeding index is 'intermediate risk'.  Positive CHADS2 values include History of CHF, History of HTN, Age > 58 years old, and History of Diabetes.  The start date was 04/01/1998.  Her last INR was 5.9 ratio.  Anticoagulation responsible provider: Graciela Husbands MD, Viviann Spare.  INR POC: 2.4.  Cuvette Lot#: [id number].  Exp: 05/2010.    Anticoagulation Management Assessment/Plan:      The patient's current anticoagulation dose is Coumadin 5 mg tabs: Take as directed by coumadin clinic..  The target INR is 2.0-3.0.  The next INR is due 05/13/2009.  Anticoagulation instructions were given to patient.  Results were reviewed/authorized by Elaina Pattee, PharmD.  She was notified by Elaina Pattee, PharmD.         Prior Anticoagulation Instructions: INR 1.4. Take 1.5 tablets today and tomorrow, then take 1 tablet daily except 0.5 tablet on Mon.  Recheck in 10 days.   Current Anticoagulation Instructions: INR 2.4. Take 1 tablet daily except 0.5 tablet on Mondays.

## 2010-02-09 NOTE — Progress Notes (Signed)
Summary: SOB  Phone Note Call from Patient Call back at [facility] Phone [phone]   Caller: Patient Summary of Call: PT BEEN HAVING SOB FOR ABOUT A WEEK Initial call taken by: Judie Grieve,  January 22, 2009 9:25 AM  Follow-up for Phone Call        Spoke with pt.  She heats with wood when she is carrying wood in she gets SOB.  When she is just working around house she is fine.  she states she is not taking lanoxin.  I have asked taht she come in to see Dr Ladona Ridgel on 01/27/09 at 2pm and bring all her medications with her that day. Dennis Bast, RN, BSN  January 22, 2009 11:58 AM

## 2010-02-09 NOTE — Medication Information (Signed)
Summary: ccr  Anticoagulant Therapy  Managed by: Weston Brass, PharmD Referring MD: Dietrich Pates PCP: Kriste Basque, MD Supervising MD: Johney Frame MD, Fayrene Fearing Indication 1: Atrial Fibrillation (ICD-427.31) Lab Used: [facility] [place] Site: [address] INR POC 2.2 INR RANGE 2 - 3  Dietary changes: no    Health status changes: no    Bleeding/hemorrhagic complications: no    Recent/future hospitalizations: no    Any changes in medication regimen? no    Recent/future dental: no  Any missed doses?: no       Is patient compliant with meds? yes       Allergies: 1)  ! Sulfa 2)  ! Lyrica (Pregabalin) 3)  ! Zithromax (Azithromycin)  Anticoagulation Management History:      The patient is taking warfarin and comes in today for a routine follow up visit.  Positive risk factors for bleeding include an age of 23 years or older and presence of serious comorbidities.  The bleeding index is 'intermediate risk'.  Positive CHADS2 values include History of CHF, History of HTN, Age > 14 years old, and History of Diabetes.  The start date was 04/01/1998.  Her last INR was 5.9 ratio.  Anticoagulation responsible provider: Allred MD, Fayrene Fearing.  INR POC: 2.2.  Cuvette Lot#: [id number].  Exp: 11/2010.    Anticoagulation Management Assessment/Plan:      The patient's current anticoagulation dose is Coumadin 5 mg tabs: Take as directed by coumadin clinic..  The target INR is 2.0-3.0.  The next INR is due 12/14/2009.  Anticoagulation instructions were given to patient.  Results were reviewed/authorized by Weston Brass, PharmD.  She was notified by Weston Brass PharmD.         Prior Anticoagulation Instructions: INR 1.9  Today, Wednesday, October 5th, take Coumadin 7.5 mg (1.5 tabs). Then, continue taking Coumadin 5 mg (1 tab) on all days except Coumadin 2.5 mg (0.5 tab) on Mondays and Fridays.  Return to clinic in 3 weeks.   Current Anticoagulation Instructions: INR 2.2  Continue same dose of 1 tablet  every day except 1/2 tablet on Monday and Friday.  Recheck INR in 4 weeks

## 2010-02-09 NOTE — Medication Information (Signed)
Summary: rov.mp  Anticoagulant Therapy  Managed by: Cloyde Reams, RN, BSN Referring MD: Dietrich Pates PCP: Kriste Basque, MD Supervising MD: Juanda Chance MD, Juna Caban Indication 1: Atrial Fibrillation (ICD-427.31) Lab Used: [facility] [place] Site: [address] INR POC 1.4 INR RANGE 2 - 3  Dietary changes: no    Health status changes: no    Bleeding/hemorrhagic complications: no    Recent/future hospitalizations: no    Any changes in medication regimen? no    Recent/future dental: no  Any missed doses?: yes     Details: Missed 1 dosage Sunday.    Is patient compliant with meds? yes       Allergies (verified): 1)  ! Sulfa 2)  ! Lyrica (Pregabalin) 3)  ! Zithromax (Azithromycin) 4)  Sulfamethoxazole (Sulfamethoxazole)  Anticoagulation Management History:      The patient is taking warfarin and comes in today for a routine follow up visit.  Positive risk factors for bleeding include an age of 12 years or older and presence of serious comorbidities.  The bleeding index is 'intermediate risk'.  Positive CHADS2 values include History of CHF, History of HTN, Age > 88 years old, and History of Diabetes.  The start date was 04/01/1998.  Her last INR was 5.9 ratio.  Anticoagulation responsible provider: Juanda Chance MD, Smitty Cords.  INR POC: 1.4.  Cuvette Lot#: [id number].  Exp: 05/2010.    Anticoagulation Management Assessment/Plan:      The patient's current anticoagulation dose is Coumadin 5 mg tabs: Take as directed by coumadin clinic..  The target INR is 2.0-3.0.  The next INR is due 03/27/2009.  Anticoagulation instructions were given to patient.  Results were reviewed/authorized by Cloyde Reams, RN, BSN.  She was notified by Cloyde Reams RN.         Prior Anticoagulation Instructions: INR 2.2  Continue 0.5 tab on each Monday and Friday, 1 tab daily.    Recheck in 4 weeks.    Current Anticoagulation Instructions: INR 1.4  Take 1 tablet today then resume same dosage 1/2 tablet on  Mondays and Fridays and 1 tablet all other days.  Recheck in 2 weeks.

## 2010-02-09 NOTE — Progress Notes (Signed)
Summary: DOES PT NEED TO SEE DR Ladona Ridgel  Phone Note Call from Patient Call back at Home Phone [phone]   Caller: Patient [phone] Reason for Call: Talk to Nurse Summary of Call: DOES PT HAVE TO SEE DR Ladona Ridgel AFTER PROCEDURE-HAS CALLED ABOUT THIS ALREADY NO CALL BACK Initial call taken by: Glynda Jaeger,  June 19, 2009 11:48 AM  Follow-up for Phone Call        pt ok to stop Coumadin 5 days prior to injection per Dr Ladona Ridgel.  Pt aware. Dennis Bast, RN, BSN  June 19, 2009 6:27 PM

## 2010-02-09 NOTE — Progress Notes (Signed)
Summary: kidney referral  Phone Note Call from Patient Call back at Home Phone [phone]   Caller: Patient Call For: nadel Summary of Call: pt requests a referral to kidney specialist.  Initial call taken by: Tivis Ringer, CNA,  September 28, 2009 9:14 AM  Follow-up for Phone Call        Dr. Kriste Basque, pt requesting referral to Dr. Vernie Ammons for left side pain x 2 yrs thats getting "worse everyday."  States pain is 10/10, achy, and feels like "something is going to burst inside."  Each pain episode lasts 3-5 hours and only some relief from laying down.  Pt states Dr. Brynda Greathouse told her she has muscle spasms but she doesn't believe this.  Pls advise if referral ok.  Thanks! Follow-up by: Gweneth Dimitri RN,  September 28, 2009 10:17 AM  Additional Follow-up for Phone Call Additional follow up Details #1::        per SN----ok to refer to urology per pts request----having left flank pain.  order has been placed in emr, Randell Loop CMA  September 28, 2009 3:12 PM   New Problems: FLANK PAIN (ICD-789.09)   Additional Follow-up for Phone Call Additional follow up Details #2::    called and spoke with pt.  pt aware SN ok'd Urology referral and that PCCs will be contacting pt with appt date and time with a Urologist.  Arman Filter LPN  September 28, 2009 3:22 PM   New Problems: FLANK PAIN (ICD-789.09)

## 2010-02-09 NOTE — Medication Information (Signed)
Summary: rov/cb  Anticoagulant Therapy  Managed by: Eda Keys, PharmD Referring MD: Dietrich Pates PCP: Kriste Basque, MD Supervising MD: Jens Som MD, Arlys John Indication 1: Atrial Fibrillation (ICD-427.31) Lab Used: [facility] [place] Site: [address] INR POC 2.6 INR RANGE 2 - 3  Dietary changes: no    Health status changes: no    Bleeding/hemorrhagic complications: no    Recent/future hospitalizations: no    Any changes in medication regimen? no    Recent/future dental: no  Any missed doses?: no       Is patient compliant with meds? yes      Comments: Pending shoulder surgery, Patient will notify clinic when this is scheduled  Allergies: 1)  ! Sulfa 2)  ! Lyrica (Pregabalin) 3)  ! Zithromax (Azithromycin)  Anticoagulation Management History:      The patient is taking warfarin and comes in today for a routine follow up visit.  Positive risk factors for bleeding include an age of 75 years or older and presence of serious comorbidities.  The bleeding index is 'intermediate risk'.  Positive CHADS2 values include History of CHF, History of HTN, Age > 75 years old, and History of Diabetes.  The start date was 04/01/1998.  Her last INR was 5.9 ratio.  Anticoagulation responsible provider: Jens Som MD, Arlys John.  INR POC: 2.6.  Cuvette Lot#: [id number].  Exp: 06/2010.    Anticoagulation Management Assessment/Plan:      The patient's current anticoagulation dose is Coumadin 5 mg tabs: Take as directed by coumadin clinic..  The target INR is 2.0-3.0.  The next INR is due 06/12/2009.  Anticoagulation instructions were given to patient.  Results were reviewed/authorized by Eda Keys, PharmD.  She was notified by [facility].         Prior Anticoagulation Instructions: INR 2.4. Take 1 tablet daily except 0.5 tablet on Mondays.  Current Anticoagulation Instructions: INR- 2.6 Continue taking 1/2 a tablet on Monday and 1 tablet on all other days of each week.Patient  to return in 4 weeks

## 2010-02-09 NOTE — Medication Information (Signed)
Summary: coumadin ck/mt  Anticoagulant Therapy  Managed by: Weston Brass, PharmD Referring MD: Dietrich Pates PCP: Kriste Basque, MD Supervising MD: Clifton James MD, Cristal Deer Indication 1: Atrial Fibrillation (ICD-427.31) Lab Used: [facility] [place] Site: [address] INR POC 3.1 INR RANGE 2 - 3  Dietary changes: no    Health status changes: no    Bleeding/hemorrhagic complications: no    Recent/future hospitalizations: no    Any changes in medication regimen? yes       Details: was on abx for UTI a few weeks ago  Recent/future dental: no  Any missed doses?: yes     Details: may have missed 1 dose a few weeks ago  Is patient compliant with meds? yes       Allergies: 1)  ! Sulfa 2)  ! Lyrica (Pregabalin) 3)  ! Zithromax (Azithromycin)  Anticoagulation Management History:      The patient is taking warfarin and comes in today for a routine follow up visit.  Positive risk factors for bleeding include an age of 75 years or older and presence of serious comorbidities.  The bleeding index is 'intermediate risk'.  Positive CHADS2 values include History of CHF, History of HTN, Age > 24 years old, and History of Diabetes.  The start date was 04/01/1998.  Her last INR was 5.9 ratio.  Anticoagulation responsible provider: Clifton James MD, Cristal Deer.  INR POC: 3.1.  Cuvette Lot#: [id number].  Exp: 10/2010.    Anticoagulation Management Assessment/Plan:      The patient's current anticoagulation dose is Coumadin 5 mg tabs: Take as directed by coumadin clinic..  The target INR is 2.0-3.0.  The next INR is due 09/03/2009.  Anticoagulation instructions were given to patient.  Results were reviewed/authorized by Weston Brass, PharmD.  She was notified by Weston Brass PharmD.         Prior Anticoagulation Instructions: INR 1.5  Take 1.5 tablets today, then resume same dosage 1 tablet daily except 1/2 tablet on Mondays.  Recheck in 2 weeks.    Current Anticoagulation Instructions: INR  3.1  Take 1/2 tablet today then resume same dose of 1 tablet every day except 1/2 tablet on Monday.

## 2010-02-09 NOTE — Medication Information (Signed)
Summary: rov/tm  Anticoagulant Therapy  Managed by: Reina Fuse, PharmD Referring MD: Dietrich Pates PCP: Kriste Basque, MD Supervising MD: Clifton James MD, Cristal Deer Indication 1: Atrial Fibrillation (ICD-427.31) Lab Used: [facility] [place] Site: [address] INR POC 1.9 INR RANGE 2 - 3  Dietary changes: yes       Details: Turnip greens Sunday and Monday.  Health status changes: no    Bleeding/hemorrhagic complications: no    Recent/future hospitalizations: no    Any changes in medication regimen? no    Recent/future dental: no  Any missed doses?: no       Is patient compliant with meds? yes      Comments: Steroid injection in shoulder yesterday. CBGs in 300-400s.  Pt is taking 2.5 mg only two days of the week rather than three.   Current Medications (verified): 1)  Coumadin 5 Mg Tabs (Warfarin Sodium) .... Take As Directed By Coumadin Clinic. 2)  Atenolol 50 Mg  Tabs (Atenolol) .... Take 1 Tablet By Mouth Twice A Day 3)  Simvastatin 40 Mg  Tabs (Simvastatin) .[name].. 1 Tab Daily At Bedtime 4)  Metformin Hcl 500 Mg  Tabs (Metformin Hcl) .... Take 2  Tablets  By Mouth Two Times A Day 5)  Glipizide 10 Mg  Tabs (Glipizide) .... Take 1 Tablet By Mouth Twice A Day 6)  Onetouch Test  Strp (Glucose Blood) .... Use As Directed 7)  Vitamin B-12 1000 Mcg Tabs (Cyanocobalamin) .... Take 1 Tablet By Mouth Once A Day 8)  Flector 1.3 % Ptch (Diclofenac Epolamine) .... Apply Patch To Painful Area Daily As Needed...  Allergies (verified): 1)  ! Sulfa 2)  ! Lyrica (Pregabalin) 3)  ! Zithromax (Azithromycin)  Anticoagulation Management History:      The patient is taking warfarin and comes in today for a routine follow up visit.  Positive risk factors for bleeding include an age of 75 years or older and presence of serious comorbidities.  The bleeding index is 'intermediate risk'.  Positive CHADS2 values include History of CHF, History of HTN, Age > 14 years old, and History of  Diabetes.  The start date was 04/01/1998.  Her last INR was 5.9 ratio.  Anticoagulation responsible provider: Clifton James MD, Cristal Deer.  INR POC: 1.9.  Cuvette Lot#: [id number].  Exp: 11/2010.    Anticoagulation Management Assessment/Plan:      The patient's current anticoagulation dose is Coumadin 5 mg tabs: Take as directed by coumadin clinic..  The target INR is 2.0-3.0.  The next INR is due 11/04/2009.  Anticoagulation instructions were given to patient.  Results were reviewed/authorized by Reina Fuse, PharmD.  She was notified by Reina Fuse PharmD.         Prior Anticoagulation Instructions: INR 3.3 Today take 1/2 pill then change dose to 1 pill everyday except 1/2 pill on Mondays, Wednesdays and Fridays. Recheck in 2 weeks.   Current Anticoagulation Instructions: INR 1.9  Today, Wednesday, October 5th, take Coumadin 7.5 mg (1.5 tabs). Then, continue taking Coumadin 5 mg (1 tab) on all days except Coumadin 2.5 mg (0.5 tab) on Mondays and Fridays.  Return to clinic in 3 weeks.

## 2010-02-09 NOTE — Assessment & Plan Note (Signed)
Summary: 7m reck/klw   Primary Care Provider:  Kriste Basque, MD  CC:  6 month ROV & review of mult medical problems....  History of Present Illness: 77 y/o WF here for a follow up visit... she has mult medical problems as noted...    ~  Oct10:  she is c/o LBP and wants XRays... she has seen DrRendall in the past w/ right shoulder surg 3/09... BP has been well controlled, and sugars at home have been in the 80-120 range... no other complaints or concerns...   ~  April 13, 2009:  she reports that she stopped her Lanoxin on her own & doesn't want to restart this med- hx chr AFib on rate control strategy + coumadin & followed by [employer]... denies CP, palpit, change in DOE, edema, etc...  BP is good;  Chol numbers are not as good on Simva40 ?compliance & reminded to take regularly + diet efforts...  DM control looks OK... her CC is her DJD- right shoulder pain w/ eval of back & shoulder as below> she requests second opinion Ortho consult...   ~  October 13, 2009:  she saw DrNorris for Ortho w/ back & shoulder XRays & rec for PT & shot in shoulder (min help, refuses PT)... notes incr BS w/ shoulder injection & under incr stress (son dx w/ throat cancer)... now c/o pain in left chest wall w/ tender 11th & 12th ribs & costal margin- intol Tramadol, just using Tylenol & we discussed rest/ heat/ rib binder & try Flector patch... she saw DrTaylor for f/u AFib 8/11- stable rate control strategy, no changes made... f/u DM labs today showed BS=322, A1c=8.3 & call to Pharm confirms not taking Metformin or Glipizide regularly!    Current Problem List:  HYPERTENSION, CHF, & Hx of ATRIAL FIBRILLATION - stable on Rx w/ COUMADIN (followed in clinic), & ATENOLOL 50mg Bid... her BP=130/82 and she feels OK- denies HA, fatigue, visual changes, CP, palipit, dizziness, syncope, dyspnea, edema, etc...  ~  4/11:  she reports that she stopped her Lanoxin 0.125 on her own & doesn't want to restart.  ~  8/11:  f/u DrTaylor & stable  on rate control strategy, no changes made.  HYPERCHOLESTEROLEMIA - on diet + SIMVASTATIN 40mg /d...  ~  FLP 2007 showed TChol 160, TG 143, HDL 55, LDL 76  ~  FLP 7/09 showed TChol 165, TG 129, HDL 60, LDL 80  ~  FLP 10/10 showed TChol 175, TG 68, HDL 80, LDL 82  ~  FLP 4/11 showed TChol 207, TG 104, HDL 84, LDL 111  DM - now on METFORMIN 500mg - 2tabsBid, GLIPZIDE 10mg Bid (not taking Januvia due to $$)... she states doing better on diet but BS are up & down...  ~  labs 7/08 showed FBS= 136 and HgA1c=8.5.Marland KitchenMarland Kitchen she never increased the Metformin to 2 Bid...  ~  labs 1/09 showed BS= 148, HgA1c= 7.1.Marland KitchenMarland Kitchen continue meds (1 Bid) + better diet...  ~  labs 7/09 showed BS= 168, HgA1c= 7.6.Marland KitchenMarland Kitchen incr Metform 2Bid, contin Glip 10Bid & Januv...  ~  she stopped the Januvia due to cost...  ~  labs 4/10 showed BS= 185, Aic= 8.0.Marland KitchenMarland Kitchen reminder to take meds regularly.  ~  labs 10/10 on Metform2Bid+GlipBid showed BS= 124, A1c= 6.7  ~  labs 4/11 showed BS= 162, A1c= 6.8.[name]. rec> better diet, take meds regularly.  ~  labs 10/11 showed BS= 322, A1c= 8.3.Marland KitchenMarland Kitchen Pharm confirms not taking meds regularly!  HIATAL HERNIA (ICD-553.3) - last EGD by DrStark  was 3/03 and showed a HH, reflux...  COLONIC POLYPS (ICD-211.3) - last colonoscopy 3/03 by DrStark showed divertics and 5mm polyp (no path avail).  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 pt reports that there is nothing they can do to help...  ~  she has had several UTI's... then perineal rash & saw GYN= neg PAP & Rx yeast infection- resolved.  DEGENERATIVE JOINT DISEASE (ICD-715.90) BACK PAIN, LUMBAR (ICD-724.2) - XRays showed scoliosis, facet degen arthritis, & osteopenia...  she has used ROBAXIN Prn...  SHOULDER PAIN, RIGHT (ICD-719.41) - prev eval & Rx by DrRendall... s/p right shoulder surg 3/09... XRays showed calcium deposit, rotator cuff prob, & intol to Tramadol... try  Flector patch.  OSTEOPOROSIS (ICD-733.00)  ~  labs 10/10 w/ Vit D level = 32... rec> start Vit D OTC 1000 u daily.  MEMORY LOSS (ICD-780.93) - concern for her memory expressed 7/09 OV- tried Aricept but she stopped it- "no different".  ANXIETY (ICD-300.00) - stress in family... 2 y/o granddau w/ seizures...   Allergies (verified): 1)  ! Sulfa 2)  ! Lyrica (Pregabalin) 3)  ! Zithromax (Azithromycin)  Comments:  Nurse/Medical Assistant: The patient's medications and allergies were reviewed with the patient and were updated in the Medication and Allergy Lists.  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 noncontributory  Social History: Reviewed history from 06/11/2008 and no changes required. Tobacco Use - No.  Alcohol Use - no Drug Use - no  Review of Systems      See HPI       The patient complains of weight loss, chest pain, dyspnea on exertion, and muscle weakness.  The patient denies anorexia, fever, weight gain, vision loss, decreased hearing, hoarseness, syncope, peripheral edema, prolonged cough, headaches, hemoptysis, abdominal pain, melena, hematochezia, severe indigestion/heartburn, hematuria, incontinence, suspicious skin lesions, transient blindness, difficulty walking, depression, unusual weight change, abnormal bleeding, enlarged lymph nodes,  and angioedema.    Vital Signs:  Patient profile:   77 year old female Height:      65 inches Weight:      126.13 pounds BMI:     21.06 O2 Sat:      99 % on Room air Temp:     97.7 degrees F oral Pulse rate:   94 / minute BP sitting:   130 / 82  (left arm) Cuff size:   regular  Vitals Entered By: Abigail Miyamoto RN (October 13, 2009 9:55 AM)  O2 Flow:  Room air  Physical Exam  Additional Exam:  WD, WN, 77 y/o WF in NAD...  GENERAL:  Alert & oriented; pleasant & cooperative... HEENT:  [place]/AT, EOM-wnl, PERRLA, EACs-clear, TMs-wnl, NOSE-clear, THROAT-clear & wnl. NECK:  Supple w/ fairROM; no JVD; normal carotid impulses w/o bruits; no thyromegaly or nodules palpated; no lymphadenopathy. CHEST:  Clear to P & A; without wheezes/ rales/ or rhonchi; +tender left 11th-12th ribs & costal margin. HEART:  Iregular Rhythm= AFib; without murmurs/ rubs/ or gallops heard... ABDOMEN:  Soft & nontender; normal bowel sounds; no organomegaly or  masses detected. EXT: without deformities, mild arthritic changes; no varicose veins/ venous insuffic/ or edema. NEURO:  CN's intact;  no focal neuro deficits found... DERM:  No lesions noted; no rash etc...    MISC. Report  Procedure date:  10/13/2009  Findings:      BMP (METABOL)   Sodium               [L]  134 mEq/L                   135-145   Potassium                 4.7 mEq/L                   3.5-5.1   Chloride                  98 mEq/L                    96-112   Carbon Dioxide            27 mEq/L                    19-32   Glucose              [H]  322 mg/dL                   16-10   BUN                       14 mg/dL                    9-60   Creatinine                0.6 mg/dL                   4.5-4.0   Calcium                   10.1 mg/dL                  9.8-11.9   GFR                       97.36 mL/min                >60  Hemoglobin A1C (A1C)   Hemoglobin A1C       [H]  8.3 %                       4.6-6.5   Impression &  Recommendations:  Problem # 1:  CHEST WALL PAIN, ACUTE (ICD-786.52) She is tender on left lat ribs & costal margin... taking Tylenol but intol to Tramadol & refuses Hydrocodone... we discussed topical Rx w/ Rest/ Heat/ Rib binder/ try Flector patch...  Problem # 2:  LOSS OF APPETITE (ICD-783.0) Advised Ensure, sustacal, boost etc...  Problem # 3:  HYPERTENSION (ICD-401.9) Controlled>  same med. Her updated medication list for this problem includes:    Atenolol 50 Mg Tabs (Atenolol) .[name]... Take 1 tablet by mouth twice a day  Orders: TLB-BMP (Basic Metabolic Panel-BMET) (80048-METABOL) TLB-A1C / Hgb A1C (Glycohemoglobin) (83036-A1C)  Problem # 4:  ATRIAL FIBRILLATION, HX OF (ICD-V12.59) Followed by DrTaylor & stable...  Problem # 5:  HYPERCHOLESTEROLEMIA (ICD-272.0) Continue Simva40 & take regularly... Her updated  medication list for this problem includes:    Simvastatin 40 Mg Tabs (Simvastatin) .[name]... 1 tab daily at bedtime  Problem # 6:  DM (ICD-250.00) F/u labs showed BS= 322, A1c=8.3 but not taking her 2 meds regularly>> given "ultimatum"= take meds daily or start insulin. Her updated medication list for this problem includes:    Metformin Hcl 500 Mg Tabs (Metformin hcl) .[name]... Take 2  tablets  by mouth two times a day    Glipizide 10 Mg Tabs (Glipizide) .[name]... Take 1 tablet by mouth twice a day  Problem # 7:  ANXIETY (ICD-300.00) She is under incr stress w/ illnesses in family but she declines Rx...  Problem # 8:  OTHER MEDICAL PROBLEMS AS NOTED>>>  Complete Medication List: 1)  Coumadin 5 Mg Tabs (Warfarin sodium) .... Take as directed by coumadin clinic. 2)  Atenolol 50 Mg Tabs (Atenolol) .... Take 1 tablet by mouth twice a day 3)  Simvastatin 40 Mg Tabs (Simvastatin) .[name].. 1 tab daily at bedtime 4)  Metformin Hcl 500 Mg Tabs (Metformin hcl) .... Take 2  tablets  by mouth two times a day 5)  Glipizide 10 Mg Tabs (Glipizide) .... Take 1 tablet by mouth twice a day 6)   Onetouch Test Strp (Glucose blood) .... Use as directed 7)  Vitamin B-12 1000 Mcg Tabs (Cyanocobalamin) .... Take 1 tablet by mouth once a day 8)  Flector 1.3 % Ptch (Diclofenac epolamine) .... Apply patch to painful area daily as needed...  Other Orders: Flu Vaccine 75yrs + MEDICARE PATIENTS (Z3086) Administration Flu vaccine - MCR (V7846)  Patient Instructions: 1)  Today we updated your med list- see below.... 2)  We decided to try the pain patch for the pain in your side... apply 1 Flector patch daily as needed... 3)  Today we did your follow up blood work... please call the "phone tree" in a few days for your lab results.Marland KitchenMarland Kitchen 4)  We gave you the Flu vaccine today... 5)  Call for any problems.Marland KitchenMarland Kitchen 6)  Please schedule a follow-up appointment in 6 months, sooner as needed... Prescriptions: FLECTOR 1.3 % PTCH (DICLOFENAC EPOLAMINE) apply patch to painful area daily as needed...  #30 x 5   Entered and Authorized by:   Michele Mcalpine MD   Signed by:   Michele Mcalpine MD on 10/13/2009   Method used:   Print then Give to Patient   RxID:   [id number]      Flu Vaccine Consent Questions     Do you have a history of severe allergic reactions to this vaccine? no    Any prior history of allergic reactions to egg and/or gelatin? no    Do you have a sensitivity to the preservative Thimersol? no    Do you have a past history of Guillan-Barre Syndrome? no    Do you currently have an acute febrile illness? no    Have you ever had a severe reaction to latex? no    Vaccine information given and explained to patient? yes    Are you currently pregnant? no    Lot Number:[id number]   Exp Date:07/10/2010   Site Given  Left Deltoid IMlu Abigail Miyamoto RN  October 13, 2009 10:44 AM

## 2010-02-09 NOTE — Progress Notes (Signed)
Summary: Need to stop coumadin  Phone Note Call from Patient Call back at Home Phone [phone]   Caller: Patient Summary of Call: Pt need to stop coumadin due to surgery 06/26/09 want to know if thats okay Initial call taken by: Judie Grieve,  June 16, 2009 2:22 PM  Follow-up for Phone Call        Has Pinched nerve in Rt shoulder. [facility] Spoke with Lawson Fiscal at Dr Ethelene Hal office and she states she needs to be off for 5 days prior to Cervical epidural injection. Flag sent to Dr Tenny Craw and nurse concerning clearance.  Follow-up by: Bethena Midget, RN, BSN,  June 16, 2009 3:55 PM

## 2010-02-09 NOTE — Progress Notes (Signed)
  Phone Note Call from Patient   Caller: Patient Summary of Call: Pt called again today to the coumadin clinic to see about coming off the coumadin before going to the surgical center next Friday 06/26/09 to get a shot in her spine for a pinched nerve.  She was also wondering about whether or not she needs to see her cardiologist prior to this procedure.  I noticed that the patient had already called about this yesterday and Bethena Midget, RN had forwarded it to Dr. Tenny Craw but I don't see a response yet, so I will forward it again. Initial call taken by: Minerva Areola, RN, BSN,  June 18, 2009 8:45 AM

## 2010-02-09 NOTE — Letter (Signed)
Summary: [facility]  [facility]   Imported By: Lester Hampstead 07/08/2009 07:57:01  _____________________________________________________________________  External Attachment:    Type:   Image     Comment:   External Document

## 2010-02-09 NOTE — Letter (Signed)
Summary: [facility]  [facility]   Imported By: Sherian Rein 09/15/2009 14:17:43  _____________________________________________________________________  External Attachment:    Type:   Image     Comment:   External Document

## 2010-02-09 NOTE — Assessment & Plan Note (Signed)
Summary: Acute NP follow up - weakness/fatigue   Primary Provider/Referring Provider:  Kriste Basque, MD  CC:  still tired/no appetite and feels like sleeping all the time.  finished cipro this past Tuesday.  History of Present Illness: 77 yo WF with known history of HTN, Hyperlipdiemia and DM   April 03, 2008--Presents for a work in visit. She has had previous UTI in past. Complains of burning and stinging w/ urination for 1 week. . She has noticed redness and irritation in vaginal area. Used several OTC creams and powder this week without help. She is concerned that she might have poison ivy b/c she worked in [employer] last week. But she has no rash anywhere else. Vaginal area very pruritic, burns and stings esp w/ urination. Does wear femine pad daily b/c of chronic urinary incontinence.   September 08, 2008 --Presents for an acute office visit. Complains of swollen lips , burning when eating or drinking x 7 days , Denies trouble swallowing, dyspnea, fever, new meds. chest pain or rash. Noticed mouth was sore with eating, burning with certain meds over last week. Noticed lips puffy, worse few days ago. Certain foods burn/irritate mouth.     ~  October 14, 2008:  she is c/o LBP and wants XRays... she has seen DrRendall in the past w/ right shoulder surg 3/09... BP has been well controlled, and sugars at home have been in the 80-120 range... no other complaints or concerns...   ~  April 13, 2009:  she reports that she stopped her Lanoxin on her own & doesn't want to restart this med- hx chr AFib on rate control strategy + coumadin & followed by [facility]... denies CP, palpit, change in DOE, edema, etc...  BP is good;  Chol numbers are not as good on Simva40 ?compliance & reminded to take regularly + diet efforts...  DM control looks OK... her CC is her DJD- right shoulder pain w/ eval of back & shoulder as below> she requests second opinion Ortho consult...  July 28, 2009 --Presents for an acute  office visit. Pt c/o urinary frequency, urgency, and lower back pain x 2 weeks. Denies chest pain, dyspnea, orthopnea, hemoptysis, fever, n/v/d, edema, headache, abx, hematuria. Good appetitie. no fevers. Last UTI >1 yr ago.    August 06, 2009 --Returns for follow up and feelings of weakness. She complains of still tired/no appetite, feels like sleeping all the time.  finished cipro this past Tuesday. Seen 10 days ago, for UTI, tx w/ Cipro. UTI symptoms resolved but she still feels tired, low appetitie and fatigued w/ no energy. Her house has no airconditioning, temp outside are >90 for last month. Denies chest pain, dyspnea, orthopnea, hemoptysis, fever, n/v/d, edema, headache,recent travel , hematuria, abd pain, bloody stools,  weight loss. Her weight is up 5 lbs since last visit.   Medications Prior to Update: 1)  Coumadin 5 Mg Tabs (Warfarin Sodium) .... Take As Directed By Coumadin Clinic. 2)  Atenolol 50 Mg  Tabs (Atenolol) .... Take 1 Tablet By Mouth Twice A Day 3)  Simvastatin 40 Mg  Tabs (Simvastatin) .[name].. 1 Tab Daily At Bedtime 4)  Metformin Hcl 500 Mg  Tabs (Metformin Hcl) .... Take 2  Tablets  By Mouth Two Times A Day 5)  Glipizide 10 Mg  Tabs (Glipizide) .... Take 1 Tablet By Mouth Twice A Day 6)  Tramadol Hcl 50 Mg Tabs (Tramadol Hcl) .... Take 1 Tab By Mouth Up To Every 6 H As Needed  For Pain... 7)  Robaxin 500 Mg Tabs (Methocarbamol) .... Take 1 Tab By Mouth Up To 4 Times Daily As Needed For Muscle Spasm... 8)  Onetouch Test  Strp (Glucose Blood) .... Use As Directed 9)  Cipro 250 Mg Tabs (Ciprofloxacin Hcl) .[name].. 1 By Mouth Two Times A Day  Current Medications (verified): 1)  Coumadin 5 Mg Tabs (Warfarin Sodium) .... Take As Directed By Coumadin Clinic. 2)  Atenolol 50 Mg  Tabs (Atenolol) .... Take 1 Tablet By Mouth Twice A Day 3)  Simvastatin 40 Mg  Tabs (Simvastatin) .[name].. 1 Tab Daily At Bedtime 4)  Metformin Hcl 500 Mg  Tabs (Metformin Hcl) .... Take 2  Tablets  By Mouth Two Times  A Day 5)  Glipizide 10 Mg  Tabs (Glipizide) .... Take 1 Tablet By Mouth Twice A Day 6)  Tramadol Hcl 50 Mg Tabs (Tramadol Hcl) .... Take 1 Tab By Mouth Up To Every 6 H As Needed For Pain... 7)  Robaxin 500 Mg Tabs (Methocarbamol) .... Take 1 Tab By Mouth Up To 4 Times Daily As Needed For Muscle Spasm... 8)  Onetouch Test  Strp (Glucose Blood) .... Use As Directed  Allergies (verified): 1)  ! Sulfa 2)  ! Lyrica (Pregabalin) 3)  ! Zithromax (Azithromycin)  Past History:  Past Medical History: Last updated: 04/13/2009  HYPERTENSION (ICD-401.9) CHF (ICD-428.0) ATRIAL FIBRILLATION, HX OF (ICD-V12.59) HYPERCHOLESTEROLEMIA (ICD-272.0) DM (ICD-250.00) ANXIETY (ICD-300.00) DYSURIA (ICD-788.1) SKIN RASH (ICD-782.1) ACUTE CYSTITIS (ICD-595.0) HIATAL HERNIA (ICD-553.3) COLONIC POLYPS (ICD-211.3) CHOLECYSTECTOMY, HX OF (ICD-V45.79) URINARY INCONTINENCE, MIXED (ICD-788.33) DEGENERATIVE JOINT DISEASE (ICD-715.90) BACK PAIN, LUMBAR (ICD-724.2) SHOULDER PAIN, RIGHT (ICD-719.41) OSTEOPOROSIS (ICD-733.00) MEMORY LOSS (ICD-780.93)  Past Surgical History: Last updated: 04/13/2009 Cholecystectomy  Family History: Last updated: 06/11/2008 noncontributory  Social History: Last updated: 06/11/2008 Tobacco Use - No.  Alcohol Use - no Drug Use - no  Risk Factors: Smoking Status: never (06/11/2008)  Review of Systems      See HPI  Vital Signs:  Patient profile:   77 year old female Height:      65 inches Weight:      131 pounds BMI:     21.88 O2 Sat:      98 % on Room air Temp:     98.2 degrees F oral Pulse rate:   82 / minute BP sitting:   122 / 70  (left arm) Cuff size:   regular  Vitals Entered By: Boone Master CNA/MA (August 06, 2009 4:23 PM)  O2 Flow:  Room air CC: still tired/no appetite, feels like sleeping all the time.  finished cipro this past Tuesday Is Patient Diabetic? Yes Comments Medications reviewed with patient Daytime contact number verified with  patient. Boone Master CNA/MA  August 06, 2009 4:22 PM    Physical Exam  Additional Exam:  WD, WN, 77 y/o WF in NAD...  GENERAL:  Alert & oriented; pleasant & cooperative... HEENT:  [place]/AT,    , EACs-clear, TMs-wnl, NOSE-clear, THROAT-clear & wnl., no exudate,   NECK:  Supple w/ fair ROM; no JVD; normal carotid impulses w/o bruits; no thyromegaly or nodules palpated; no lymphadenopathy. CHEST:  Clear to P & A; without wheezes/ rales/ or rhonchi. HEART:  Regular Rhythm; without murmurs/ rubs/ or gallops. ABDOMEN:  Soft & nontender; normal bowel sounds; no organomegaly or masses detected, neg CVA tenderness  Skin clear with no lesions.  EXT: without deformities, mild arthritic changes; no varicose veins/ venous insuffic/ or edema.      Impression & Recommendations:  Problem # 1:  CYSTITIS, ACUTE (ICD-595.0) resolved w/ abx.  The following medications were removed from the medication list:    Cipro 250 Mg Tabs (Ciprofloxacin hcl) .[name]... 1 by mouth two times a day  Orders: Est. Patient Level IV (16109)  Problem # 2:  LOSS OF APPETITE (ICD-783.0) Multiple somatic complaints w/ decreased appetitie, low energy, fatigue, malasie will check labs and follow up accordingly.  hold MVI for now b/c it can cause nausea add dexilant once daily for possible gerd for 10 days-samples given.  Orders: TLB-CBC Platelet - w/Differential (85025-CBCD) TLB-B12 + Folate Pnl ([id number]-B14/NWG) TLB-TSH (Thyroid Stimulating Hormone) (84443-TSH) Est. Patient Level IV (95621)  Complete Medication List: 1)  Coumadin 5 Mg Tabs (Warfarin sodium) .... Take as directed by coumadin clinic. 2)  Atenolol 50 Mg Tabs (Atenolol) .... Take 1 tablet by mouth twice a day 3)  Simvastatin 40 Mg Tabs (Simvastatin) .[name].. 1 tab daily at bedtime 4)  Metformin Hcl 500 Mg Tabs (Metformin hcl) .... Take 2  tablets  by mouth two times a day 5)  Glipizide 10 Mg Tabs (Glipizide) .... Take 1 tablet by mouth twice a day 6)   Tramadol Hcl 50 Mg Tabs (Tramadol hcl) .... Take 1 tab by mouth up to every 6 h as needed for pain.Marland KitchenMarland Kitchen 7)  Robaxin 500 Mg Tabs (Methocarbamol) .... Take 1 tab by mouth up to 4 times daily as needed for muscle spasm... 8)  Onetouch Test Strp (Glucose blood) .... Use as directed  Other Orders: TLB-BMP (Basic Metabolic Panel-BMET) (80048-METABOL) TLB-A1C / Hgb A1C (Glycohemoglobin) (83036-A1C)  Patient Instructions: 1)  Hold multivitamin  2)  Dexilant 60mg  once daily for 10 days 3)  Eat small frequent meals  4)  I will call with lab results.  5)  Please contact office for sooner follow up if symptoms do not improve or worsen  6)  follow up Dr. Kriste Basque as scheduled.

## 2010-02-09 NOTE — Progress Notes (Signed)
Summary: appt  Phone Note Call from Patient Call back at Home Phone [phone]   Caller: Patient Call For: nadel Reason for Call: Talk to Nurse Summary of Call: pt had records from Dr. Sable Feil office sent last Tuesday 04/14/09.  Still hasn't heard from Dr. Ranell Patrick office re: appt.  Can you check on this and call pt back. Initial call taken by: Eugene Gavia,  April 20, 2009 9:16 AM  Follow-up for Phone Call        spoke to [email or web address] office records will be faxed to me today so i can forward them to do norris for review before appt will be made pt aware  Follow-up by: Oneita Jolly,  April 20, 2009 10:10 AM

## 2010-02-09 NOTE — Letter (Signed)
Summary: Handout Printed  Printed Handout:  - Coumadin Instructions-w/out Meds 

## 2010-02-09 NOTE — Progress Notes (Signed)
Summary: elevated sugar  Phone Note Call from Patient Call back at [phone]   Caller: Patient Call For: nadel Summary of Call: pt's sugar elevated (300) she said that she got an injection in her shoulder and her sugar have been elevated every sense. Initial call taken by: Rickard Patience,  July 01, 2009 10:29 AM  Follow-up for Phone Call        pt states she got a cortisone shot in her shoulder on friday 6/17 and blood sugar has been elevated ever since they have been running between 220-300----pls advise  Philipp Deputy [facility]  July 01, 2009 10:44 AM   Additional Follow-up for Phone Call Additional follow up Details #1::        per SN---this will make her sugar go up---per SN  recs  let if wear off---increase water/fluid intake,  diet with no sugars no sweets and low carbs and cont the current meds she has been on.  thanks Randell Loop CMA  July 01, 2009 11:50 AM     Additional Follow-up for Phone Call Additional follow up Details #2::    Spoke with pt's sister and advised her of above recs per SN.  She verbalized understanding and states that she will let the pt know.   Follow-up by: Vernie Murders,  July 01, 2009 11:54 AM

## 2010-02-09 NOTE — Assessment & Plan Note (Signed)
Summary: increase urination/ov in HP/la   Visit Type:  NP Acute visit Primary Provider/Referring Provider:  Kriste Basque, MD  CC:  Pt c/o urinary frequency, urgency, and and lower back pain x 2 weeks.  History of Present Illness: 77 yo WF with known history of HTN, Hyperlipdiemia and DM   April 03, 2008--Presents for a work in visit. She has had previous UTI in past. Complains of burning and stinging w/ urination for 1 week. . She has noticed redness and irritation in vaginal area. Used several OTC creams and powder this week without help. She is concerned that she might have poison ivy b/c she worked in [employer] last week. But she has no rash anywhere else. Vaginal area very pruritic, burns and stings esp w/ urination. Does wear femine pad daily b/c of chronic urinary incontinence.   September 08, 2008 --Presents for an acute office visit. Complains of swollen lips , burning when eating or drinking x 7 days , Denies trouble swallowing, dyspnea, fever, new meds. chest pain or rash. Noticed mouth was sore with eating, burning with certain meds over last week. Noticed lips puffy, worse few days ago. Certain foods burn/irritate mouth.     ~  October 14, 2008:  she is c/o LBP and wants XRays... she has seen DrRendall in the past w/ right shoulder surg 3/09... BP has been well controlled, and sugars at home have been in the 80-120 range... no other complaints or concerns...   ~  April 13, 2009:  she reports that she stopped her Lanoxin on her own & doesn't want to restart this med- hx chr AFib on rate control strategy + coumadin & followed by [facility]... denies CP, palpit, change in DOE, edema, etc...  BP is good;  Chol numbers are not as good on Simva40 ?compliance & reminded to take regularly + diet efforts...  DM control looks OK... her CC is her DJD- right shoulder pain w/ eval of back & shoulder as below> she requests second opinion Ortho consult...  July 28, 2009 --Presents for an acute office  visit. Pt c/o urinary frequency, urgency, and lower back pain x 2 weeks. Denies chest pain, dyspnea, orthopnea, hemoptysis, fever, n/v/d, edema, headache, abx, hematuria. Good appetitie. no fevers. Last UTI >1 yr ago.     Medications Prior to Update: 1)  Coumadin 5 Mg Tabs (Warfarin Sodium) .... Take As Directed By Coumadin Clinic. 2)  Atenolol 50 Mg  Tabs (Atenolol) .... Take 1 Tablet By Mouth Twice A Day 3)  Simvastatin 40 Mg  Tabs (Simvastatin) .[name].. 1 Tab Daily At Bedtime 4)  Metformin Hcl 500 Mg  Tabs (Metformin Hcl) .... Take 2  Tablets  By Mouth Two Times A Day 5)  Glipizide 10 Mg  Tabs (Glipizide) .... Take 1 Tablet By Mouth Twice A Day 6)  Tramadol Hcl 50 Mg Tabs (Tramadol Hcl) .... Take 1 Tab By Mouth Up To Every 6 H As Needed For Pain... 7)  Robaxin 500 Mg Tabs (Methocarbamol) .... Take 1 Tab By Mouth Up To 4 Times Daily As Needed For Muscle Spasm... 8)  Caltrate 600+d 600-400 Mg-Unit Tabs (Calcium Carbonate-Vitamin D) .... Take 1 Tab By Mouth Two Times A Day For Bones.Marland KitchenMarland Kitchen 9)  Womens Multivitamin Plus  Tabs (Multiple Vitamins-Minerals) .... T 1 Tab By Mouth Once Daily... 10)  Vitamin D 1000 Unit Tabs (Cholecalciferol) .... Take 1 Cap By Mouth Once Daily... 11)  Onetouch Test  Strp (Glucose Blood) .... Use As Directed  Current  Medications (verified): 1)  Coumadin 5 Mg Tabs (Warfarin Sodium) .... Take As Directed By Coumadin Clinic. 2)  Atenolol 50 Mg  Tabs (Atenolol) .... Take 1 Tablet By Mouth Twice A Day 3)  Simvastatin 40 Mg  Tabs (Simvastatin) .[name].. 1 Tab Daily At Bedtime 4)  Metformin Hcl 500 Mg  Tabs (Metformin Hcl) .... Take 2  Tablets  By Mouth Two Times A Day 5)  Glipizide 10 Mg  Tabs (Glipizide) .... Take 1 Tablet By Mouth Twice A Day 6)  Tramadol Hcl 50 Mg Tabs (Tramadol Hcl) .... Take 1 Tab By Mouth Up To Every 6 H As Needed For Pain... 7)  Robaxin 500 Mg Tabs (Methocarbamol) .... Take 1 Tab By Mouth Up To 4 Times Daily As Needed For Muscle Spasm... 8)  Onetouch Test  Strp  (Glucose Blood) .... Use As Directed  Allergies (verified): 1)  ! Sulfa 2)  ! Lyrica (Pregabalin) 3)  ! Zithromax (Azithromycin)  Past History:  Past Medical History: Last updated: 04/13/2009  HYPERTENSION (ICD-401.9) CHF (ICD-428.0) ATRIAL FIBRILLATION, HX OF (ICD-V12.59) HYPERCHOLESTEROLEMIA (ICD-272.0) DM (ICD-250.00) ANXIETY (ICD-300.00) DYSURIA (ICD-788.1) SKIN RASH (ICD-782.1) ACUTE CYSTITIS (ICD-595.0) HIATAL HERNIA (ICD-553.3) COLONIC POLYPS (ICD-211.3) CHOLECYSTECTOMY, HX OF (ICD-V45.79) URINARY INCONTINENCE, MIXED (ICD-788.33) DEGENERATIVE JOINT DISEASE (ICD-715.90) BACK PAIN, LUMBAR (ICD-724.2) SHOULDER PAIN, RIGHT (ICD-719.41) OSTEOPOROSIS (ICD-733.00) MEMORY LOSS (ICD-780.93)  Past Surgical History: Last updated: 04/13/2009 Cholecystectomy  Family History: Last updated: 06/11/2008 noncontributory  Social History: Last updated: 06/11/2008 Tobacco Use - No.  Alcohol Use - no Drug Use - no  Risk Factors: Smoking Status: never (06/11/2008)  Review of Systems      See HPI  Vital Signs:  Patient profile:   77 year old female Height:      65 inches Weight:      126.75 pounds BMI:     21.17 O2 Sat:      100 % on Room air Temp:     98.7 degrees F oral Pulse rate:   90 / minute BP sitting:   126 / 88  (left arm) Cuff size:   regular  Vitals Entered By: Zackery Barefoot CMA (July 28, 2009 2:35 PM)  O2 Flow:  Room air CC: Pt c/o urinary frequency, urgency, and lower back pain x 2 weeks Comments Medications reviewed with patient Verified contact number and pharmacy with patient Zackery Barefoot CMA  July 28, 2009 2:37 PM    Physical Exam  Additional Exam:  WD, WN, 77 y/o WF in NAD...  GENERAL:  Alert & oriented; pleasant & cooperative... HEENT:  [place]/AT,    , EACs-clear, TMs-wnl, NOSE-clear, THROAT-clear & wnl., no exudate,   NECK:  Supple w/ fair ROM; no JVD; normal carotid impulses w/o bruits; no thyromegaly or nodules palpated; no  lymphadenopathy. CHEST:  Clear to P & A; without wheezes/ rales/ or rhonchi. HEART:  Regular Rhythm; without murmurs/ rubs/ or gallops. ABDOMEN:  Soft & nontender; normal bowel sounds; no organomegaly or masses detected, neg CVA tenderness  Skin clear with no lesions.  EXT: without deformities, mild arthritic changes; no varicose veins/ venous insuffic/ or edema.  UDIP Large Leukocytes, neg nitrate, neg glucose and bld.      Impression & Recommendations:  Problem # 1:  CYSTITIS, ACUTE (ICD-595.0)  cipro x 7 days urinary hygiene meassures.  Her updated medication list for this problem includes:    Cipro 250 Mg Tabs (Ciprofloxacin hcl) .[name]... 1 by mouth two times a day  Orders: TLB-Udip w/ Micro (81001-URINE) T-Urine Culture (Spectrum Order) (  [id number]) Est. Patient Level III (13086)  Medications Added to Medication List This Visit: 1)  Cipro 250 Mg Tabs (Ciprofloxacin hcl) .[name].. 1 by mouth two times a day  Patient Instructions: 1)  Cipro 250mg  two times a day for 7days 2)  Increase fluids.  3)  Wear  cotton underwear, empty bladder frequently .  4)  Please contact office for sooner follow up if symptoms do not improve or worsen  Prescriptions: CIPRO 250 MG TABS (CIPROFLOXACIN HCL) 1 by mouth two times a day  #14 x 0   Entered and Authorized by:   Rubye Oaks NP   Signed by:   Rubye Oaks NP on 07/28/2009   Method used:   Electronically to        [facility]  [phone]* (retail)       [address]       [place], [place]  [address]       Ph: [phone] or [phone]       Fax: [phone]   RxID:   [id number]

## 2010-02-09 NOTE — Medication Information (Signed)
Summary: rov/ewj  Anticoagulant Therapy  Managed by: Shelby Dubin, PharmD, BCPS, CPP Referring MD: Dietrich Pates PCP: Kriste Basque, MD Supervising MD: Juanda Chance MD, Riannon Mukherjee Indication 1: Atrial Fibrillation (ICD-427.31) Lab Used: [facility] [place] Site: [address] INR POC 2.2 INR RANGE 2 - 3  Dietary changes: no    Health status changes: no    Bleeding/hemorrhagic complications: yes       Details: UTI required hospitalization  Recent/future hospitalizations: yes       Details: see above  Any changes in medication regimen? yes       Details: oxycodone, ondansetron, cefpodoxime for UTI  Recent/future dental: no  Any missed doses?: no       Is patient compliant with meds? yes       Current Medications (verified): 1)  Coumadin 5 Mg Tabs (Warfarin Sodium) .... Take As Directed By Coumadin Clinic. 2)  Atenolol 50 Mg  Tabs (Atenolol) .... Take 1 Tablet By Mouth Twice A Day 3)  Lanoxin 0.125 Mg  Tabs (Digoxin) .[name].. 1 Tab Daily 4)  Simvastatin 40 Mg  Tabs (Simvastatin) .[name].. 1 Tab Daily At Bedtime 5)  Metformin Hcl 500 Mg  Tabs (Metformin Hcl) .... Take 2  Tablets  By Mouth Two Times A Day 6)  Glipizide 10 Mg  Tabs (Glipizide) .... Take 1 Tablet By Mouth Twice A Day 7)  Onetouch Test  Strp (Glucose Blood) .... Use As Directed 8)  Robaxin 500 Mg Tabs (Methocarbamol) .... Take 1 Tab By Mouth Up To 4 Times Daily As Needed For Muscle Spasm... 9)  Cefpodoxime Proxetil 200 Mg Tabs (Cefpodoxime Proxetil) .[name].. 1 By Mouth Two Times A Day For Infection.  Allergies (verified): 1)  ! Sulfa 2)  ! Lyrica (Pregabalin) 3)  ! Zithromax (Azithromycin) 4)  Sulfamethoxazole (Sulfamethoxazole)  Anticoagulation Management History:      The patient is taking warfarin and comes in today for a routine follow up visit.  Positive risk factors for bleeding include an age of 75 years or older and presence of serious comorbidities.  The bleeding index is 'intermediate risk'.  Positive CHADS2 values  include History of CHF, History of HTN, Age > 75 years old, and History of Diabetes.  The start date was 04/01/1998.  Her last INR was 5.9 ratio.  Anticoagulation responsible provider: Juanda Chance MD, Smitty Cords.  INR POC: 2.2.  Cuvette Lot#: [id number].  Exp: 04/2010.    Anticoagulation Management Assessment/Plan:      The patient's current anticoagulation dose is Coumadin 5 mg tabs: Take as directed by coumadin clinic..  The target INR is 2.0-3.0.  The next INR is due 03/12/2009.  Anticoagulation instructions were given to patient.  Results were reviewed/authorized by Shelby Dubin, PharmD, BCPS, CPP.  She was notified by Shelby Dubin PharmD, BCPS, CPP.         Prior Anticoagulation Instructions: INR 2.6  Take 1 tablet everyday except 1/2 tablet on Mondays and Fridays. Recheck in 4 weeks.  Current Anticoagulation Instructions: INR 2.2  Continue 0.5 tab on each Monday and Friday, 1 tab daily.    Recheck in 4 weeks.

## 2010-02-09 NOTE — Progress Notes (Signed)
Summary: prescript for Gabapentin never sent to pharmacy  Phone Note Call from Patient   Caller: Patient Call For: nadel Summary of Call: prescript for gabapentin 100mg   [facility] [address]  Pt states pharmacy still hasn't received rx for gabapentin, she can be reached at [phone].Darletta Moll  October 26, 2009 3:03 PM  Initial call taken by: Rickard Patience,  October 26, 2009 2:04 PM  Follow-up for Phone Call        per phone note, rx was sent on Friday 10/23/2009.   Per pt, pharmacy never received rx.. therefore called rx into pharmacy.  pt aware.  Aundra Millet Reynolds LPN  October 26, 2009 3:14 PM

## 2010-02-09 NOTE — Medication Information (Signed)
Summary: rov/sp  Anticoagulant Therapy  Managed by: Bethena Midget, RN, BSN Referring MD: Dietrich Pates PCP: Kriste Basque, MD Supervising MD: Tenny Craw MD, Gunnar Fusi Indication 1: Atrial Fibrillation (ICD-427.31) Lab Used: [facility] [place] Site: [address] INR POC 3.1 INR RANGE 2 - 3  Dietary changes: no    Health status changes: no    Bleeding/hemorrhagic complications: no    Recent/future hospitalizations: yes       Details: 11/18-11/20 Admitted for questionable TIA/CVA  Any changes in medication regimen? no    Recent/future dental: no  Any missed doses?: no       Is patient compliant with meds? yes       Allergies: 1)  ! Sulfa 2)  ! Lyrica (Pregabalin) 3)  ! Zithromax (Azithromycin)  Anticoagulation Management History:      The patient is taking warfarin and comes in today for a routine follow up visit.  Positive risk factors for bleeding include an age of 75 years or older and presence of serious comorbidities.  The bleeding index is 'intermediate risk'.  Positive CHADS2 values include History of CHF, History of HTN, Age > 29 years old, and History of Diabetes.  The start date was 04/01/1998.  Her last INR was 5.9 ratio.  Anticoagulation responsible Roderic Lammert: Tenny Craw MD, Gunnar Fusi.  INR POC: 3.1.  Cuvette Lot#: [id number].  Exp: 12/2010.    Anticoagulation Management Assessment/Plan:      The patient's current anticoagulation dose is Coumadin 5 mg tabs: Take as directed by coumadin clinic..  The target INR is 2.0-3.0.  The next INR is due 12/28/2009.  Anticoagulation instructions were given to patient.  Results were reviewed/authorized by Bethena Midget, RN, BSN.  She was notified by Bethena Midget, RN, BSN.         Prior Anticoagulation Instructions: INR 2.2  Continue same dose of 1 tablet every day except 1/2 tablet on Monday and Friday.  Recheck INR in 4 weeks   Current Anticoagulation Instructions: INR 3.1 Continue 1pill everyday except 1/2 pill on Mondays and Fridays.  Recheck in 2 weeks.

## 2010-02-09 NOTE — Medication Information (Signed)
Summary: ccr  Anticoagulant Therapy  Managed by: Elaina Pattee, PharmD Referring MD: Dietrich Pates PCP: Kriste Basque, MD Supervising MD: Graciela Husbands MD, Viviann Spare Indication 1: Atrial Fibrillation (ICD-427.31) Lab Used: [facility] [place] Site: [address] INR POC 1.4 INR RANGE 2 - 3  Dietary changes: yes       Details: Ate some greens yesterday.  Health status changes: yes       Details: Has had blood sugars in 60's. Pt is aware of how to treat appropriately.  MD aware.  Bleeding/hemorrhagic complications: no    Recent/future hospitalizations: no    Any changes in medication regimen? no    Recent/future dental: no  Any missed doses?: yes     Details: Missed 1 dose but does not remember when.  Is patient compliant with meds? yes      Comments: Patient does not want to come back in 10 days due to gas prices. Advised of risk, and patient will try to come in.  Allergies: 1)  ! Sulfa 2)  ! Lyrica (Pregabalin) 3)  ! Zithromax (Azithromycin)  Anticoagulation Management History:      The patient is taking warfarin and comes in today for a routine follow up visit.  Positive risk factors for bleeding include an age of 30 years or older and presence of serious comorbidities.  The bleeding index is 'intermediate risk'.  Positive CHADS2 values include History of CHF, History of HTN, Age > 51 years old, and History of Diabetes.  The start date was 04/01/1998.  Her last INR was 5.9 ratio.  Anticoagulation responsible provider: Graciela Husbands MD, Viviann Spare.  INR POC: 1.4.  Cuvette Lot#: [id number].  Exp: 05/2010.    Anticoagulation Management Assessment/Plan:      The patient's current anticoagulation dose is Coumadin 5 mg tabs: Take as directed by coumadin clinic..  The target INR is 2.0-3.0.  The next INR is due 04/24/2009.  Anticoagulation instructions were given to patient.  Results were reviewed/authorized by Elaina Pattee, PharmD.  She was notified by Elaina Pattee, PharmD.         Prior  Anticoagulation Instructions: INR 1.4  Take 1 tablet today then resume same dosage 1/2 tablet on Mondays and Fridays and 1 tablet all other days.  Recheck in 2 weeks.    Current Anticoagulation Instructions: INR 1.4. Take 1.5 tablets today and tomorrow, then take 1 tablet daily except 0.5 tablet on Mon.  Recheck in 10 days.

## 2010-02-09 NOTE — Assessment & Plan Note (Signed)
Summary: per check out/sf   Visit Type:  Follow-up Primary Provider:  Kriste Basque, MD   History of Present Illness: Michelle Herrera returns today for followup of her atrial fibrillation.  She is a 77 yo woman with a longstanding h/o atrial fibrillation, initially with a difficult to control ventricular rate.  We had initially treated her with amiodarone as well as tikosyn but they were ineffective.  She has been relegated to a strategy of rate control but has done fairly well.  Her CHF symptoms are class 2.  No c/p or peripheral edema.  She remains on coumadin. She notes that she has lost weight but this seems to have stabilized.  She has lost her appetite as she lives without air conditioning and does not feel like eating in the heat.  Current Medications (verified): 1)  Coumadin 5 Mg Tabs (Warfarin Sodium) .... Take As Directed By Coumadin Clinic. 2)  Atenolol 50 Mg  Tabs (Atenolol) .... Take 1 Tablet By Mouth Twice A Day 3)  Simvastatin 40 Mg  Tabs (Simvastatin) .[name].. 1 Tab Daily At Bedtime 4)  Metformin Hcl 500 Mg  Tabs (Metformin Hcl) .... Take 2  Tablets  By Mouth Two Times A Day 5)  Glipizide 10 Mg  Tabs (Glipizide) .... Take 1 Tablet By Mouth Twice A Day 6)  Tramadol Hcl 50 Mg Tabs (Tramadol Hcl) .... Take 1 Tab By Mouth Up To Every 6 H As Needed For Pain... 7)  Onetouch Test  Strp (Glucose Blood) .... Use As Directed 8)  Vitamin B-12 1000 Mcg Tabs (Cyanocobalamin) .... Take 1 Tablet By Mouth Once A Day  Allergies: 1)  ! Sulfa 2)  ! Lyrica (Pregabalin) 3)  ! 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    Vital Signs:  Patient profile:   77 year old female Height:      65 inches Weight:      128 pounds BMI:     21.38 Pulse rate:   87 / minute BP sitting:   130 / 80  (left arm)  Vitals Entered By: Laurance Flatten CMA (August 13, 2009 3:17 PM)  Physical Exam  General:  Well developed, well nourished, in no acute distress. Head:  normocephalic and atraumatic Eyes:  PERRLA/EOM intact; conjunctiva and lids normal. Mouth:  Teeth, gums and palate normal. Oral mucosa normal. Neck:  Neck supple, no JVD. No masses, thyromegaly or abnormal cervical nodes. Lungs:  Clear bilaterally to auscultation. No wheezes, rales, or rhonchi. Heart:  IRIR with no murmur.  PMI is not enlarged or laterally displaced. Abdomen:  Bowel sounds positive; abdomen soft and non-tender without masses, organomegaly, or hernias noted. No hepatosplenomegaly. Msk:  Back normal, normal gait. Muscle strength and tone normal. Pulses:  pulses normal in all 4 extremities Extremities:  No clubbing or cyanosis. Neurologic:  Alert and oriented x 3.   EKG  Procedure date:  08/13/2009  Findings:      Atrial fibrillation with a controlled ventricular response rate of: 87.  Impression & Recommendations:  Problem # 1:  ATRIAL FIBRILLATION, HX OF (ICD-V12.59) Her symptoms appear to be well controlled.  She will continue her current meds. Her updated medication list for this problem includes:    Coumadin 5 Mg Tabs (Warfarin sodium) .[name]... Take as directed by coumadin clinic.    Atenolol 50 Mg Tabs (Atenolol) .[name]... Take 1 tablet by mouth twice a day  Problem # 2:  HYPERTENSION  (ICD-401.9) Her blood pressure is well controlled.  I have recommended a low sodium diet. Her updated medication list for this problem includes:    Atenolol 50 Mg Tabs (Atenolol) .[name]... Take 1 tablet by mouth twice a day  Problem # 3:  HYPERCHOLESTEROLEMIA (ICD-272.0) She will continue on with her current meds.  A low fat diet is requested. Her updated medication list for this problem includes:    Simvastatin 40 Mg Tabs (Simvastatin) .[name]... 1 tab daily at bedtime  Patient Instructions: 1)  Your physician recommends that you schedule a follow-up appointment in: 1 year. 2)  Your physician recommends that you continue on your current medications as directed. Please refer to the Current Medication list given to you today.

## 2010-02-09 NOTE — Medication Information (Signed)
Summary: Michelle Herrera  Anticoagulant Therapy  Managed by: Bethena Midget, RN, BSN Referring MD: Dietrich Pates PCP: Kriste Basque, MD Supervising MD: Juanda Chance MD, Bruce Indication 1: Atrial Fibrillation (ICD-427.31) Lab Used: [facility] [place] Site: [address] INR POC 3.3 INR RANGE 2 - 3  Dietary changes: yes       Details: Eating less green veggies  Health status changes: no    Bleeding/hemorrhagic complications: no    Recent/future hospitalizations: no    Any changes in medication regimen? no    Recent/future dental: no  Any missed doses?: no       Is patient compliant with meds? yes       Allergies: 1)  ! Sulfa 2)  ! Lyrica (Pregabalin) 3)  ! Zithromax (Azithromycin)  Anticoagulation Management History:      The patient is taking warfarin and comes in today for a routine follow up visit.  Positive risk factors for bleeding include an age of 75 years or older and presence of serious comorbidities.  The bleeding index is 'intermediate risk'.  Positive CHADS2 values include History of CHF, History of HTN, Age > 11 years old, and History of Diabetes.  The start date was 04/01/1998.  Her last INR was 5.9 ratio.  Anticoagulation responsible provider: Juanda Chance MD, Smitty Cords.  INR POC: 3.3.  Cuvette Lot#: [id number].  Exp: 11/2010.    Anticoagulation Management Assessment/Plan:      The patient's current anticoagulation dose is Coumadin 5 mg tabs: Take as directed by coumadin clinic..  The target INR is 2.0-3.0.  The next INR is due 10/14/2009.  Anticoagulation instructions were given to patient.  Results were reviewed/authorized by Bethena Midget, RN, BSN.  She was notified by Bethena Midget, RN, BSN.         Prior Anticoagulation Instructions: INR 3.2 Hold dose today. Change friday dose 1/2 tablet and keep monday at 1/2 tablet. Continue 1 tablet all other days. We'll check again in 2 weeks.  Current Anticoagulation Instructions: INR 3.3 Today take 1/2 pill then change dose to 1 pill  everyday except 1/2 pill on Mondays, Wednesdays and Fridays. Recheck in 2 weeks.

## 2010-02-09 NOTE — Progress Notes (Signed)
Summary: urinating frequently  Phone Note Call from Patient   Caller: Patient Call For: Axl Rodino Summary of Call: urinating frequently no burning or pain Initial call taken by: Rickard Patience,  July 27, 2009 1:51 PM  Follow-up for Phone Call        called and spoke with pt.  pt c/o urinary frequency.  Pt states this started approx 2 weeks ago.  Pt states she is urinating at least 8 times during the day and an additional 2 to 3 times at night.  Pt states "it feels warm" when she urinates but no actual burning.  Pt denies any pain while urinating, no odor and no blood in urine.  Pt states urine is pale yellow in color and is not cloudy.  Pt states she noticed a "slight back ache" yesterday while at [place] but nothing since then.  Pt denies fever.  Pt's recent BS today was 160.  Will forward message to TP to address.  Aundra Millet Reynolds LPN  July 27, 2009 2:50 PM   Additional Follow-up for Phone Call Additional follow up Details #1::        have ov in [place] tomorrow  will be glad to see  Additional Follow-up by: Rubye Oaks NP,  July 27, 2009 2:51 PM    Additional Follow-up for Phone Call Additional follow up Details #2::    called and spoke with pt and she is agreed to appt in [place] with TP tomorrow at 2:45. Randell Loop [facility]  July 27, 2009 2:57 PM

## 2010-02-09 NOTE — Progress Notes (Signed)
Summary: SLEEP/ DIET ISSUES  Phone Note Call from Patient Call back at Home Phone [phone]   Caller: Patient Call For: [facility]RETT Summary of Call: PT WAS SEEN 7/19. TODAY C/O "FEELING SLEEPY ALL THE TIME X 1 MONTH" SAY SHE JUST WANTS TO SLEEP AFTER BREAKFAST/ LUNCH AND IN THE EARLY EVENING AS WELL. ALSO LOSING WEIGHT/ NO APPETITE. DENIES FEVER. NO N OR V.  Initial call taken by: Tivis Ringer, CNA,  August 06, 2009 1:02 PM  Follow-up for Phone Call        Spoke with pt.  She states that ever since last seen on 7/19 she has had no appetite and extreme fatigue "tired all of the time".  She states that she feels like her UTI has cleared up.  Appt sched with TP for this afternoon at 4:30 pm. Follow-up by: Vernie Murders,  August 06, 2009 2:24 PM

## 2010-02-09 NOTE — Medication Information (Signed)
Summary: rov/kb  Anticoagulant Therapy  Managed by: Weston Brass, PharmD Referring MD: Dietrich Pates PCP: Kriste Basque, MD Supervising MD: Eden Emms MD, Theron Arista Indication 1: Atrial Fibrillation (ICD-427.31) Lab Used: [facility] [place] Site: [address] INR POC 2.0 INR RANGE 2 - 3  Dietary changes: no    Health status changes: no    Bleeding/hemorrhagic complications: no    Recent/future hospitalizations: no    Any changes in medication regimen? no    Recent/future dental: no  Any missed doses?: yes     Details: patient not sure  Is patient compliant with meds? yes       Allergies: 1)  ! Sulfa 2)  ! Lyrica (Pregabalin) 3)  ! Zithromax (Azithromycin)  Anticoagulation Management History:      The patient is taking warfarin and comes in today for a routine follow up visit.  Positive risk factors for bleeding include an age of 75 years or older and presence of serious comorbidities.  The bleeding index is 'intermediate risk'.  Positive CHADS2 values include History of CHF, History of HTN, Age > 37 years old, and History of Diabetes.  The start date was 04/01/1998.  Her last INR was 5.9 ratio.  Anticoagulation responsible provider: Eden Emms MD, Theron Arista.  INR POC: 2.0.  Cuvette Lot#: [id number].  Exp: 08/2010.    Anticoagulation Management Assessment/Plan:      The patient's current anticoagulation dose is Coumadin 5 mg tabs: Take as directed by coumadin clinic..  The target INR is 2.0-3.0.  The next INR is due 07/03/2009.  Anticoagulation instructions were given to patient.  Results were reviewed/authorized by Weston Brass, PharmD.  She was notified by Alcus Dad B Pharm.         Prior Anticoagulation Instructions: INR- 2.6 Continue taking 1/2 a tablet on Monday and 1 tablet on all other days of each week.Patient to return in 4 weeks  Current Anticoagulation Instructions: INR-2.0  Take an extra half a tablet today and resume normal dosing schedule. Return in 3 weeks.

## 2010-02-09 NOTE — Progress Notes (Signed)
Summary: Gabapentin Rx  Phone Note Call from Patient Call back at Home Phone [phone]   Caller: Patient Call For: nadel Summary of Call: wants something called in for her neuropthy to [facility] wants generic Initial call taken by: Lacinda Axon,  October 23, 2009 1:08 PM  Follow-up for Phone Call        called and spoke with pt.  pt states she has noticed her legs "getting weak and giving out on her."  Pt states this just started "all of a sudden" this week.  Pt denies any pain or trauma to her legs.  Pt believes this is d/t neuropathy and requests an rx for this.  Please advise.  Thank you.  Aundra Millet Reynolds LPN  October 23, 2009 1:35 PM  Allergies (verified):  1)  ! Sulfa 2)  ! Lyrica (Pregabalin) 3)  ! Zithromax (Azithromycin)   Additional Follow-up for Phone Call Additional follow up Details #1::        per sn ok to call in gabapentin 100mg  #90 1 by mouth three times a day with 5 refills Additional Follow-up by: Philipp Deputy CMA,  October 23, 2009 4:05 PM    Additional Follow-up for Phone Call Additional follow up Details #2::    Rx sent to [facility].  Pt aware of above recs per SN and aware rx sent.  She verbalized understanding. Follow-up by: Gweneth Dimitri RN,  October 23, 2009 4:10 PM  New/Updated Medications: GABAPENTIN 100 MG CAPS (GABAPENTIN) Take 1 capsule by mouth three times a day Prescriptions: GABAPENTIN 100 MG CAPS (GABAPENTIN) Take 1 capsule by mouth three times a day  #90 x 5   Entered by:   Gweneth Dimitri RN   Authorized by:   Michele Mcalpine MD   Signed by:   Gweneth Dimitri RN on 10/23/2009   Method used:   Electronically to        [facility]  [phone]* (retail)       [address]       [place], [place]  [address]       Ph: [phone] or [phone]       Fax: [phone]   RxID:   [id number]

## 2010-02-09 NOTE — Progress Notes (Signed)
Summary: ear infection  Phone Note Call from Patient   Caller: Patient Call For: nadel Summary of Call: pt think she may have inner ear infection because her balance is off. Initial call taken by: Rickard Patience,  November 19, 2009 1:55 PM  Follow-up for Phone Call        Pt c/o being dizzy x 2 months. She denies ear pain or headache, just dizziness. Please advise. Carron Curie CMA  November 19, 2009 2:12 PM allergies: sulfa, lyrica, zpak  [facility]   SN out of the office until monday---TP please advise. thanks Randell Loop CMA  November 19, 2009 3:00 PM   Additional Follow-up for Phone Call Additional follow up Details #1::        will need ov  could have some sinus congestion could use claritin as needed to see if this helps  could also use antivert otc as needed for dizziness if not improivng will need ov.  Please contact office for sooner follow up if symptoms do not improve or worsen  Additional Follow-up by: Rubye Oaks NP,  November 19, 2009 3:14 PM    Additional Follow-up for Phone Call Additional follow up Details #2::    Spoke with pt and notified of recs per TP.  Pt verbalized understanding.  She states that she will call if not gettin better for ov. Follow-up by: Vernie Murders,  November 19, 2009 3:20 PM

## 2010-02-09 NOTE — Assessment & Plan Note (Signed)
Summary: ROV PER PT CALL C/O SOB   Visit Type:  Follow-up Primary Provider:  Kriste Basque, MD   History of Present Illness: Michelle Herrera returns today for followup of her atrial fibrillation.  She is a 77 yo woman with a longstanding h/o atrial fibrillation, initially with a difficult to control ventricular rate.  We had initially treated her with amiodarone as well as tikosyn but they were ineffective.  She has been relegated to a strategy of rate control but has done fairly well.  Her CHF symptoms are class 2.  No c/p or peripheral edema.  She remains on coumadin.  Current Medications (verified): 1)  Coumadin 5 Mg Tabs (Warfarin Sodium) .... Take As Directed By Coumadin Clinic. 2)  Atenolol 50 Mg  Tabs (Atenolol) .... Take 1 Tablet By Mouth Twice A Day 3)  Lanoxin 0.125 Mg  Tabs (Digoxin) .[name].. 1 Tab Daily 4)  Simvastatin 40 Mg  Tabs (Simvastatin) .[name].. 1 Tab Daily At Bedtime 5)  Metformin Hcl 500 Mg  Tabs (Metformin Hcl) .... Take 2  Tablets  By Mouth Two Times A Day 6)  Glipizide 10 Mg  Tabs (Glipizide) .... Take 1 Tablet By Mouth Twice A Day 7)  Onetouch Test  Strp (Glucose Blood) .... Use As Directed 8)  Robaxin 500 Mg Tabs (Methocarbamol) .... Take 1 Tab By Mouth Up To 4 Times Daily As Needed For Muscle Spasm...  Allergies: 1)  ! Sulfa 2)  ! Lyrica (Pregabalin) 3)  ! Zithromax (Azithromycin) 4)  Sulfamethoxazole (Sulfamethoxazole)  Past History:  Past Medical History: Last updated: 10/14/2008 ATRIAL FIBRILLATION, HX OF (ICD-V12.59) CHF (ICD-428.0) HYPERTENSION (ICD-401.9) HYPERCHOLESTEROLEMIA (ICD-272.0) DM (ICD-250.00) ANXIETY (ICD-300.00) DYSURIA (ICD-788.1) SKIN RASH (ICD-782.1) ACUTE CYSTITIS (ICD-595.0) HIATAL HERNIA (ICD-553.3) COLONIC POLYPS (ICD-211.3) CHOLECYSTECTOMY, HX OF (ICD-V45.79) URINARY INCONTINENCE, MIXED (ICD-788.33) DEGENERATIVE JOINT DISEASE (ICD-715.90) OSTEOPOROSIS (ICD-733.00) MEMORY LOSS (ICD-780.93)    Past Surgical History: Last updated:  10/14/2008 Cholecystectomy  Review of Systems  The patient denies chest pain, syncope, dyspnea on exertion, and peripheral edema.    Vital Signs:  Patient profile:   77 year old female Height:      65 inches Weight:      139 pounds BMI:     23.21 Pulse rate:   81 / minute BP sitting:   130 / 100  (left arm)  Vitals Entered By: Laurance Flatten CMA (January 29, 2009 3:11 PM)  Physical Exam  General:  Well developed, well nourished, in no acute distress. Head:  normocephalic and atraumatic Eyes:  PERRLA/EOM intact; conjunctiva and lids normal. Mouth:  Teeth, gums and palate normal. Oral mucosa normal. Neck:  Neck supple, no JVD. No masses, thyromegaly or abnormal cervical nodes. Lungs:  Clear bilaterally to auscultation. No wheezes, rales, or rhonchi. Heart:  IRIR with no murmur.  PMI is not enlarged or laterally displaced. Abdomen:  Bowel sounds positive; abdomen soft and non-tender without masses, organomegaly, or hernias noted. No hepatosplenomegaly. Msk:  Back normal, normal gait. Muscle strength and tone normal. Pulses:  pulses normal in all 4 extremities Extremities:  No clubbing or cyanosis. Neurologic:  Alert and oriented x 3.   EKG  Procedure date:  01/29/2009  Findings:      Atrial fibrillation with a controlled ventricular response rate of: 81.  Impression & Recommendations:  Problem # 1:  ATRIAL FIBRILLATION, HX OF (ICD-V12.59) Her ventricular rates appear to be well controlled.  Continue current meds. Her updated medication list for this problem includes:    Coumadin 5 Mg Tabs (  Warfarin sodium) .[name]... Take as directed by coumadin clinic.    Atenolol 50 Mg Tabs (Atenolol) .[name]... Take 1 tablet by mouth twice a day  Orders: EKG w/ Interpretation (93000)  Problem # 2:  HYPERTENSION (ICD-401.9) Continue current meds and maintain a low sodium diet. Her updated medication list for this problem includes:    Atenolol 50 Mg Tabs (Atenolol) .[name]... Take 1 tablet by mouth  twice a day  Orders: EKG w/ Interpretation (93000)  Problem # 3:  HYPERCHOLESTEROLEMIA (ICD-272.0) A low fat diet and simvastatin are recommended. Her updated medication list for this problem includes:    Simvastatin 40 Mg Tabs (Simvastatin) .[name]... 1 tab daily at bedtime  Patient Instructions: 1)  Your physician recommends that you schedule a follow-up appointment in: 12 months with Dr Ladona Ridgel

## 2010-02-09 NOTE — Medication Information (Signed)
Summary: rov/kb  Anticoagulant Therapy  Managed by: Cloyde Reams, RN, BSN Referring MD: Dietrich Pates PCP: Kriste Basque, MD Supervising MD: Graciela Husbands MD, Viviann Spare Indication 1: Atrial Fibrillation (ICD-427.31) Lab Used: [facility] [place] Site: [address] INR POC 1.5 INR RANGE 2 - 3  Dietary changes: no    Health status changes: no    Bleeding/hemorrhagic complications: no    Recent/future hospitalizations: no    Any changes in medication regimen? no    Recent/future dental: no  Any missed doses?: yes     Details: Off coumadin 5 days prior to epidural on 07/03/09.  Resumed coumadin on 07/03/09.  Is patient compliant with meds? yes       Allergies: 1)  ! Sulfa 2)  ! Lyrica (Pregabalin) 3)  ! Zithromax (Azithromycin)  Anticoagulation Management History:      The patient is taking warfarin and comes in today for a routine follow up visit.  Positive risk factors for bleeding include an age of 75 years or older and presence of serious comorbidities.  The bleeding index is 'intermediate risk'.  Positive CHADS2 values include History of CHF, History of HTN, Age > 5 years old, and History of Diabetes.  The start date was 04/01/1998.  Her last INR was 5.9 ratio.  Anticoagulation responsible provider: Graciela Husbands MD, Viviann Spare.  INR POC: 1.5.  Cuvette Lot#: [id number].  Exp: 09/2010.    Anticoagulation Management Assessment/Plan:      The patient's current anticoagulation dose is Coumadin 5 mg tabs: Take as directed by coumadin clinic..  The target INR is 2.0-3.0.  The next INR is due 07/28/2009.  Anticoagulation instructions were given to patient.  Results were reviewed/authorized by Cloyde Reams, RN, BSN.  She was notified by Cloyde Reams RN.         Prior Anticoagulation Instructions: INR-2.0  Take an extra half a tablet today and resume normal dosing schedule. Return in 3 weeks.   Current Anticoagulation Instructions: INR 1.5  Take 1.5 tablets today, then resume same dosage 1  tablet daily except 1/2 tablet on Mondays.  Recheck in 2 weeks.

## 2010-02-10 ENCOUNTER — Ambulatory Visit: Admit: 2010-02-10 | Payer: Self-pay

## 2010-02-11 NOTE — Medication Information (Signed)
Summary: rov/tm  Anticoagulant Therapy  Managed by: Louann Sjogren, PharmD Referring MD: Dietrich Pates PCP: Kriste Basque, MD Supervising MD: Patty Sermons MD, Maisie Fus Indication 1: Atrial Fibrillation (ICD-427.31) Lab Used: [facility] [place] Site: [address] INR POC 2.4 INR RANGE 2 - 3  Dietary changes: no    Health status changes: no    Bleeding/hemorrhagic complications: no    Recent/future hospitalizations: yes       Details: Went to hospital 3 weeks ago wtih light stroke- does not know what INR was at the time  Any changes in medication regimen? no    Recent/future dental: no  Any missed doses?: no       Is patient compliant with meds? yes       Allergies: 1)  ! Sulfa 2)  ! Lyrica (Pregabalin) 3)  ! Zithromax (Azithromycin)  Anticoagulation Management History:      The patient is taking warfarin and comes in today for a routine follow up visit.  Positive risk factors for bleeding include an age of 22 years or older and presence of serious comorbidities.  The bleeding index is 'intermediate risk'.  Positive CHADS2 values include History of CHF, History of HTN, Age > 52 years old, and History of Diabetes.  The start date was 04/01/1998.  Her last INR was 5.9 ratio.  Anticoagulation responsible provider: Patty Sermons MD, Maisie Fus.  INR POC: 2.4.  Cuvette Lot#: [id number].  Exp: 12/2010.    Anticoagulation Management Assessment/Plan:      The patient's current anticoagulation dose is Coumadin 5 mg tabs: Take as directed by coumadin clinic..  The target INR is 2.0-3.0.  The next INR is due 02/10/2010.  Anticoagulation instructions were given to patient.  Results were reviewed/authorized by Louann Sjogren, PharmD.  She was notified by Louann Sjogren PharmD.         Prior Anticoagulation Instructions: INR 3.1 Continue 1pill everyday except 1/2 pill on Mondays and Fridays. Recheck in 2 weeks.   Current Anticoagulation Instructions: INR (goal 2-3)  Continue taking 1  tablet everyday except take 1/2 tablet on Mondays and Fridays.  Return to clinic in 4 weeks on Wednesday, February 1st at 10:30AM.

## 2010-02-11 NOTE — Progress Notes (Signed)
Summary: refill-rx sent.Marland KitchenATCx1  Phone Note Call from Patient Call back at Home Phone [phone]   Caller: Patient Call For: nadel Summary of Call: pt needs her glipizide called into [facility] pt she has been out a week Initial call taken by: Lacinda Axon,  January 12, 2010 8:10 AM  Follow-up for Phone Call        refill was sent electronically on 01-05-10. I called [facility] and they did nt receive this refill so I gave a verbal refill authorization x 6 refills.   ATC pt to advise. NA and no voicemail. WCB. Carron Curie CMA  January 12, 2010 9:09 AM  Spoke with pt and advised that her rx was sent to pharm. Follow-up by: Vernie Murders,  January 12, 2010 11:37 AM

## 2010-02-12 ENCOUNTER — Encounter (INDEPENDENT_AMBULATORY_CARE_PROVIDER_SITE_OTHER): Payer: Medicare Other

## 2010-02-12 ENCOUNTER — Encounter: Payer: Self-pay | Admitting: Cardiology

## 2010-02-12 DIAGNOSIS — Z7901 Long term (current) use of anticoagulants: Secondary | ICD-10-CM

## 2010-02-12 DIAGNOSIS — I4891 Unspecified atrial fibrillation: Secondary | ICD-10-CM

## 2010-02-17 NOTE — Medication Information (Signed)
Summary: rov/ewj  Anticoagulant Therapy  Managed by: Bethena Midget, RN, BSN Referring MD: Dietrich Pates PCP: Kriste Basque, MD Supervising MD: Jens Som MD, Arlys John Indication 1: Atrial Fibrillation (ICD-427.31) Lab Used: [facility] [place] Site: [address] INR POC 2.7 INR RANGE 2 - 3  Dietary changes: no    Health status changes: no    Bleeding/hemorrhagic complications: no    Recent/future hospitalizations: no    Any changes in medication regimen? no    Recent/future dental: no  Any missed doses?: no       Is patient compliant with meds? yes       Allergies: 1)  ! Sulfa 2)  ! Lyrica (Pregabalin) 3)  ! Zithromax (Azithromycin)  Anticoagulation Management History:      The patient is taking warfarin and comes in today for a routine follow up visit.  Positive risk factors for bleeding include an age of 75 years or older and presence of serious comorbidities.  The bleeding index is 'intermediate risk'.  Positive CHADS2 values include History of CHF, History of HTN, Age > 27 years old, and History of Diabetes.  The start date was 04/01/1998.  Her last INR was 5.9 ratio.  Anticoagulation responsible provider: Jens Som MD, Arlys John.  INR POC: 2.7.  Cuvette Lot#: [id number].  Exp: 01/2011.    Anticoagulation Management Assessment/Plan:      The patient's current anticoagulation dose is Coumadin 5 mg tabs: Take as directed by coumadin clinic..  The target INR is 2.0-3.0.  The next INR is due 03/12/2010.  Anticoagulation instructions were given to patient.  Results were reviewed/authorized by Bethena Midget, RN, BSN.  She was notified by Bethena Midget, RN, BSN.         Prior Anticoagulation Instructions: INR (goal 2-3)  Continue taking 1 tablet everyday except take 1/2 tablet on Mondays and Fridays.  Return to clinic in 4 weeks on Wednesday, February 1st at 10:30AM.    Current Anticoagulation Instructions: INR 2.7 Continue 1 pill everyday except 1/2 pill on Mondays and Fridays.  Recheck in 4 weeks.

## 2010-03-03 NOTE — Letter (Signed)
Summary: [facility]   Imported By: Sherian Rein 02/22/2010 14:33:28  _____________________________________________________________________  External Attachment:    Type:   Image     Comment:   External Document

## 2010-03-10 ENCOUNTER — Encounter: Payer: Self-pay | Admitting: Internal Medicine

## 2010-03-10 DIAGNOSIS — I4821 Permanent atrial fibrillation: Secondary | ICD-10-CM | POA: Insufficient documentation

## 2010-03-10 DIAGNOSIS — Z7901 Long term (current) use of anticoagulants: Secondary | ICD-10-CM

## 2010-03-10 DIAGNOSIS — I4891 Unspecified atrial fibrillation: Secondary | ICD-10-CM

## 2010-03-15 ENCOUNTER — Encounter (INDEPENDENT_AMBULATORY_CARE_PROVIDER_SITE_OTHER): Payer: Medicare Other

## 2010-03-15 ENCOUNTER — Encounter: Payer: Self-pay | Admitting: Cardiology

## 2010-03-15 DIAGNOSIS — I4891 Unspecified atrial fibrillation: Secondary | ICD-10-CM

## 2010-03-15 DIAGNOSIS — Z79899 Other long term (current) drug therapy: Secondary | ICD-10-CM

## 2010-03-15 LAB — CONVERTED CEMR LAB: POC INR: 2.6

## 2010-03-23 NOTE — Medication Information (Signed)
Summary: rov/ewj  Anticoagulant Therapy  Managed by: Bayard Hugger, PharmD Referring MD: Dietrich Pates PCP: Kriste Basque, MD Supervising MD: Daleen Squibb MD, Maisie Fus Indication 1: Atrial Fibrillation (ICD-427.31) Lab Used: [facility] [place] Site: [address] INR POC 2.6 INR RANGE 2 - 3  Dietary changes: no    Health status changes: no    Bleeding/hemorrhagic complications: no    Recent/future hospitalizations: no    Any changes in medication regimen? no    Recent/future dental: no  Any missed doses?: no       Is patient compliant with meds? yes       Allergies: 1)  ! Sulfa 2)  ! Lyrica (Pregabalin) 3)  ! Zithromax (Azithromycin)  Anticoagulation Management History:      Positive risk factors for bleeding include an age of 75 years or older and presence of serious comorbidities.  The bleeding index is 'intermediate risk'.  Positive CHADS2 values include History of CHF, History of HTN, Age > 60 years old, and History of Diabetes.  The start date was 04/01/1998.  Her last INR was 5.9 ratio.  Anticoagulation responsible provider: Daleen Squibb MD, Maisie Fus.  INR POC: 2.6.  Cuvette Lot#: [id number].  Exp: 01/2011.    Anticoagulation Management Assessment/Plan:      The patient's current anticoagulation dose is Coumadin 5 mg tabs: Take as directed by coumadin clinic..  The target INR is 2.0-3.0.  The next INR is due 04/15/2010.  Anticoagulation instructions were given to patient.  Results were reviewed/authorized by Bayard Hugger, PharmD.         Prior Anticoagulation Instructions: INR 2.7 Continue 1 pill everyday except 1/2 pill on Mondays and Fridays. Recheck in 4 weeks.   Current Anticoagulation Instructions: INR 2.6 The patient is to continue with the same dose of coumadin.  This dosage includes:  1/2 tablet (2.5mg ) on Mon and Fri 1 tablet (5mg ) on the rest of the days Recheck INR on 04/15/10

## 2010-03-24 LAB — GLUCOSE, CAPILLARY
Glucose-Capillary: 101 mg/dL — ABNORMAL HIGH (ref 70–99)
Glucose-Capillary: 129 mg/dL — ABNORMAL HIGH (ref 70–99)
Glucose-Capillary: 131 mg/dL — ABNORMAL HIGH (ref 70–99)
Glucose-Capillary: 132 mg/dL — ABNORMAL HIGH (ref 70–99)
Glucose-Capillary: 132 mg/dL — ABNORMAL HIGH (ref 70–99)
Glucose-Capillary: 133 mg/dL — ABNORMAL HIGH (ref 70–99)
Glucose-Capillary: 143 mg/dL — ABNORMAL HIGH (ref 70–99)
Glucose-Capillary: 153 mg/dL — ABNORMAL HIGH (ref 70–99)
Glucose-Capillary: 175 mg/dL — ABNORMAL HIGH (ref 70–99)

## 2010-03-24 LAB — URINALYSIS, ROUTINE W REFLEX MICROSCOPIC
Bilirubin Urine: NEGATIVE
Hgb urine dipstick: NEGATIVE
Protein, ur: NEGATIVE mg/dL
Urobilinogen, UA: 0.2 mg/dL (ref 0.0–1.0)

## 2010-03-24 LAB — CBC
Hemoglobin: 12.4 g/dL (ref 12.0–15.0)
MCH: 33.2 pg (ref 26.0–34.0)
MCHC: 33.9 g/dL (ref 30.0–36.0)
Platelets: 215 10*3/uL (ref 150–400)

## 2010-03-24 LAB — COMPREHENSIVE METABOLIC PANEL
AST: 22 U/L (ref 0–37)
CO2: 28 mEq/L (ref 19–32)
Calcium: 9.3 mg/dL (ref 8.4–10.5)
Creatinine, Ser: 0.66 mg/dL (ref 0.4–1.2)
GFR calc Af Amer: >60 mL/min (ref >60)
GFR calc non Af Amer: >60 mL/min (ref >60)

## 2010-03-24 LAB — PROTIME-INR
INR: 1.57 — ABNORMAL HIGH (ref 0.00–1.49)
INR: 1.63 — ABNORMAL HIGH (ref 0.00–1.49)
Prothrombin Time: 25.3 seconds — ABNORMAL HIGH (ref 11.6–15.2)

## 2010-03-24 LAB — DIFFERENTIAL
Basophils Relative: 0 % (ref 0–1)
Eosinophils Absolute: 0.1 10*3/uL (ref 0.0–0.7)
Neutrophils Relative %: 76 % (ref 43–77)

## 2010-03-24 LAB — HEMOGLOBIN A1C
Hgb A1c MFr Bld: 7.7 % — ABNORMAL HIGH (ref <5.7)
Mean Plasma Glucose: 174 mg/dL — ABNORMAL HIGH (ref <117)

## 2010-03-24 LAB — CK TOTAL AND CKMB (NOT AT ARMC)
CK, MB: 1.5 ng/mL (ref 0.3–4.0)
Total CK: 54 U/L (ref 7–177)

## 2010-03-24 LAB — LIPID PANEL
HDL: 67 mg/dL (ref >39)
Triglycerides: 71 mg/dL (ref <150)
VLDL: 14 mg/dL (ref 0–40)

## 2010-03-28 LAB — URINE CULTURE: Colony Count: 25000

## 2010-03-28 LAB — CBC
HCT: 37.8 % (ref 36.0–46.0)
Hemoglobin: 12.4 g/dL (ref 12.0–15.0)
MCHC: 33 g/dL (ref 30.0–36.0)
MCV: 98.3 fL (ref 78.0–100.0)
Platelets: 203 10*3/uL (ref 150–400)
RBC: 3.84 MIL/uL — ABNORMAL LOW (ref 3.87–5.11)
RDW: 14.2 % (ref 11.5–15.5)
WBC: 6.6 K/uL (ref 4.0–10.5)

## 2010-03-28 LAB — COMPREHENSIVE METABOLIC PANEL WITH GFR
ALT: 19 U/L (ref 0–35)
Alkaline Phosphatase: 66 U/L (ref 39–117)
CO2: 25 meq/L (ref 19–32)
Chloride: 101 meq/L (ref 96–112)
GFR calc non Af Amer: >60 mL/min (ref >60)
Glucose, Bld: 258 mg/dL — ABNORMAL HIGH (ref 70–99)
Potassium: 3.7 meq/L (ref 3.5–5.1)
Sodium: 136 meq/L (ref 135–145)
Total Bilirubin: 0.7 mg/dL (ref 0.3–1.2)
Total Protein: 6.5 g/dL (ref 6.0–8.3)

## 2010-03-28 LAB — DIFFERENTIAL
Basophils Absolute: 0 K/uL (ref 0.0–0.1)
Basophils Relative: 0 % (ref 0–1)
Eosinophils Absolute: 0.1 10*3/uL (ref 0.0–0.7)
Eosinophils Relative: 1 % (ref 0–5)
Lymphocytes Relative: 11 % — ABNORMAL LOW (ref 12–46)
Lymphs Abs: 0.7 10*3/uL (ref 0.7–4.0)
Monocytes Absolute: 0.5 10*3/uL (ref 0.1–1.0)
Monocytes Relative: 8 % (ref 3–12)
Neutro Abs: 5.3 10*3/uL (ref 1.7–7.7)
Neutrophils Relative %: 80 % — ABNORMAL HIGH (ref 43–77)

## 2010-03-28 LAB — COMPREHENSIVE METABOLIC PANEL
AST: 28 U/L (ref 0–37)
Albumin: 4 g/dL (ref 3.5–5.2)
BUN: 9 mg/dL (ref 6–23)
Calcium: 9.2 mg/dL (ref 8.4–10.5)
Creatinine, Ser: 0.77 mg/dL (ref 0.4–1.2)
GFR calc Af Amer: >60 mL/min (ref >60)

## 2010-03-28 LAB — LIPASE, BLOOD: Lipase: 53 U/L (ref 11–59)

## 2010-03-28 LAB — URINALYSIS, ROUTINE W REFLEX MICROSCOPIC
Bilirubin Urine: NEGATIVE
Glucose, UA: 500 mg/dL — AB
Ketones, ur: NEGATIVE mg/dL
Protein, ur: 100 mg/dL — AB

## 2010-03-28 LAB — URINE MICROSCOPIC-ADD ON

## 2010-03-28 LAB — GLUCOSE, CAPILLARY: Glucose-Capillary: 210 mg/dL — ABNORMAL HIGH (ref 70–99)

## 2010-03-30 ENCOUNTER — Telehealth: Payer: Self-pay | Admitting: Pulmonary Disease

## 2010-03-30 DIAGNOSIS — N3945 Continuous leakage: Secondary | ICD-10-CM

## 2010-03-30 NOTE — Telephone Encounter (Signed)
Called and spoke with pt and she states her bladder has been leaking excessively. Pt went to a gyno several years ago and they told her their was nothing they could do to prevent this and no surgery would help. Pt states she soaks up 6 pads a day and she has to get up 3-4 times a night to use the restroom. Pt states she was prescribed a medication by the gyno years ago but it cost $100 and she does not remember the name of the medication. Pt would like Dr. Jodelle Green recommendations. Please advise thanks Carver Fila, MA

## 2010-03-30 NOTE — Telephone Encounter (Signed)
Called and informed pt we where referring her to urology and order was sent to [facility]. Pt verbalized understanding.  Carver Fila, [place]

## 2010-03-30 NOTE — Telephone Encounter (Signed)
Appt scheduled for pt with Dr. Aldean Ast at [facility] for Monday 04/19/10 at 1:15. Pt is to arrive at 1:00. [address] 2nd floor. Called and spoke with pt and she is aware of appt date, time and location as well as arrival time. Rhonda Cobb

## 2010-03-30 NOTE — Telephone Encounter (Signed)
Refer to urology  If not able to get in soon will need ov to check for uti

## 2010-04-09 ENCOUNTER — Encounter: Payer: Self-pay | Admitting: *Deleted

## 2010-04-14 ENCOUNTER — Ambulatory Visit (INDEPENDENT_AMBULATORY_CARE_PROVIDER_SITE_OTHER): Payer: Medicare Other | Admitting: Pulmonary Disease

## 2010-04-14 ENCOUNTER — Encounter: Payer: Self-pay | Admitting: Pulmonary Disease

## 2010-04-14 ENCOUNTER — Other Ambulatory Visit (INDEPENDENT_AMBULATORY_CARE_PROVIDER_SITE_OTHER): Payer: Medicare Other

## 2010-04-14 DIAGNOSIS — I10 Essential (primary) hypertension: Secondary | ICD-10-CM

## 2010-04-14 DIAGNOSIS — R531 Weakness: Secondary | ICD-10-CM

## 2010-04-14 DIAGNOSIS — E7800 Pure hypercholesterolemia, unspecified: Secondary | ICD-10-CM

## 2010-04-14 DIAGNOSIS — D649 Anemia, unspecified: Secondary | ICD-10-CM

## 2010-04-14 DIAGNOSIS — R5383 Other fatigue: Secondary | ICD-10-CM

## 2010-04-14 DIAGNOSIS — E119 Type 2 diabetes mellitus without complications: Secondary | ICD-10-CM

## 2010-04-14 DIAGNOSIS — R5381 Other malaise: Secondary | ICD-10-CM

## 2010-04-14 LAB — CBC WITH DIFFERENTIAL/PLATELET
Eosinophils Absolute: 0.1 10*3/uL (ref 0.0–0.7)
Eosinophils Relative: 1.6 % (ref 0.0–5.0)
MCHC: 34.4 g/dL (ref 30.0–36.0)
MCV: 95.9 fl (ref 78.0–100.0)
Monocytes Absolute: 0.5 10*3/uL (ref 0.1–1.0)
Neutrophils Relative %: 70.1 % (ref 43.0–77.0)
Platelets: 193 10*3/uL (ref 150.0–400.0)
WBC: 5.3 10*3/uL (ref 4.5–10.5)

## 2010-04-14 LAB — HEPATIC FUNCTION PANEL
ALT: 15 U/L (ref 0–35)
AST: 19 U/L (ref 0–37)
Total Bilirubin: 0.8 mg/dL (ref 0.3–1.2)
Total Protein: 6.5 g/dL (ref 6.0–8.3)

## 2010-04-14 LAB — BASIC METABOLIC PANEL
BUN: 10 mg/dL (ref 6–23)
CO2: 30 mEq/L (ref 19–32)
Chloride: 104 mEq/L (ref 96–112)
Glucose, Bld: 91 mg/dL (ref 70–99)
Potassium: 4.7 mEq/L (ref 3.5–5.1)

## 2010-04-14 LAB — IBC PANEL: Iron: 83 ug/dL (ref 42–145)

## 2010-04-14 MED ORDER — METHOCARBAMOL 500 MG PO TABS: 500.0000 mg | ORAL_TABLET | Freq: Three times a day (TID) | ORAL | Status: AC

## 2010-04-14 MED ORDER — TRAMADOL HCL 50 MG PO TABS: 50.0000 mg | ORAL_TABLET | Freq: Three times a day (TID) | ORAL | Status: DC | PRN

## 2010-04-14 MED ORDER — METFORMIN HCL 500 MG PO TABS: 1000.0000 mg | ORAL_TABLET | Freq: Two times a day (BID) | ORAL | Status: DC

## 2010-04-14 NOTE — Patient Instructions (Signed)
Today we updated your med list...    Continue your current meds the same for now...    We wrote new prescriptions for ROBAXIN for muscle spasm, and TRAMADOL for pain (you may take up to 3 per day of each if necessary).  Today we did your follow up blood work...    We will call you w/ the results when available...  Continue the ENSURE for your weight... Call for any questions... Let's plan a follow up appt in 4 months.Marland KitchenMarland Kitchen

## 2010-04-14 NOTE — Progress Notes (Signed)
Subjective:    Patient ID: Michelle Herrera, female    DOB: 1930-08-18, 77 y.o.   MRN: [id number]  HPI 77 y/o WF here for a follow up visit... she has mult medical problems including:    ~  October 13, 2009:  she saw DrNorris for Ortho w/ back & shoulder XRays & rec for PT & shot in shoulder (min help, refuses PT)... notes incr BS w/ shoulder injection & under incr stress (son dx w/ throat cancer)... now c/o pain in left chest wall w/ tender 11th & 12th ribs & costal margin- intol Tramadol, just using Tylenol & we discussed rest/ heat/ rib binder & try Flector patch... she saw DrTaylor for f/u AFib 8/11- stable rate control strategy, no changes made... f/u DM labs today showed BS=322, A1c=8.3 & call to Pharm confirms not taking Metformin or Glipizide regularly!  ~  April 14, 2010:  2mo ROV & she reports [facility] 11/11 by Neuro for "light stroke" w/ full recovery (SEE BELOW) & continued on her Coumadin by DrWillis & no other changes made; requests refill prescriptions today, & we will check non-fasting blood work...  HYPERTENSION, CHF, & Hx of ATRIAL FIBRILLATION - stable on Rx w/ COUMADIN (followed in clinic), & ATENOLOL 50mg Bid... her BP=136/82 and she feels OK- denies HA, fatigue, visual changes, CP, palipit, dizziness, syncope, dyspnea, edema, etc... ~  4/11:  she reports that she stopped her Lanoxin 0.125 on her own & doesn't want to restart. ~  8/11:  f/u DrTaylor & stable on Coumadin w/ rate control strategy, no changes made. ~  11/11:  Hosp by Neuro w/ TIA> 2DEcho showed mild LVH, diffuse HK w/ EF= 45%, thick pos MV leaflet w/ restrict motion & modMR, dilated LA&RA, severeTR & PAsys=49, can't r/o PFO...  HYPERCHOLESTEROLEMIA - on diet + SIMVASTATIN 40mg /d... ~  FLP 2007 showed TChol 160, TG 143, HDL 55, LDL 76 ~  FLP 7/09 showed TChol 165, TG 129, HDL 60, LDL 80 ~  FLP 10/10 showed TChol 175, TG 68, HDL 80, LDL 82 ~  FLP 4/11 showed TChol 207, TG 104, HDL 84, LDL 111  DM - on METFORMIN  500mg - 2tabsBid, GLIPZIDE 10mg Bid (not taking Januvia due to $$)... Wt up 8# to 134# today...  ~  labs 1/09 showed BS= 148, HgA1c= 7.1.Marland KitchenMarland Kitchen continue meds + better diet... ~  labs 7/09 showed BS= 168, HgA1c= 7.6.Marland KitchenMarland Kitchen incr Metform 2Bid, contin Glip 10Bid & Januv... ~  she stopped the Januvia due to cost... ~  labs 4/10 showed BS= 185, Aic= 8.0.Marland KitchenMarland Kitchen reminder to take meds regularly. ~  labs 10/10 on Metform2Bid+GlipBid showed BS= 124, A1c= 6.7 ~  labs 4/11 showed BS= 162, A1c= 6.8.[name]. rec> better diet, take meds regularly. ~  labs 10/11 showed BS= 322, A1c= 8.3.Marland KitchenMarland Kitchen Pharm confirms not taking meds regularly!!! Discussed w/ pt... ~  Labs 4/12 showed BS= 91, A1c= 7.9.Marland KitchenMarland Kitchen Rec> keep same meds, take regularly, better low carb diet...  HIATAL HERNIA (ICD-553.3) - last EGD by DrStark was 3/03 and showed a HH, reflux...  COLONIC POLYPS (ICD-211.3) - last colonoscopy 3/03 by DrStark showed divertics and 5mm polyp (no path avail).  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 pt reports that there is nothing they can do to help... ~  she has had several UTI's... then perineal rash & saw GYN= neg PAP & Rx yeast infection- resolved.  DEGENERATIVE JOINT DISEASE (ICD-715.90) BACK PAIN, LUMBAR (ICD-724.2) -  XRays showed scoliosis, facet degen arthritis, & osteopenia...  she has used ROBAXIN Prn... ~  4/12:  She reports eval from DrNorris w/ "arthritis all down my backbone" & he rec epid steroid injection, but she reports improved on NEURONTIN 100mg  Tid now...  SHOULDER PAIN, RIGHT (ICD-719.41) - prev eval & Rx by DrRendall... s/p right shoulder surg 3/09... XRays showed calcium deposit, rotator cuff prob, & intol to Tramadol... ~  4/12:  She reports that Ortho wants to try "Rooster comb in my knee"...  OSTEOPOROSIS (ICD-733.00) ~  labs 10/10 w/ Vit D level = 32... rec> start Vit D OTC 1000 u daily.  MEMORY LOSS (ICD-780.93)  - concern for her memory expressed 7/09 OV- tried Aricept but she stopped it- "no different". TIA/ INFARCT - she was [facility] 11/11 by DrWillis w/ left sided weakness, ?left facial droop, & MRI showed 2 sm infarcts on right (frontal & ?pontine);  MRA was neg;  CDopplers were neg;  Deficits all cleared rapidly;  2DEcho was abn- see above;  She was continued on her Coumadin Rx- no changes made...  ANXIETY (ICD-300.00) - stress in family... 2 y/o granddau w/ seizures...   Past Surgical History  Procedure Date  . Cholecystectomy     Outpatient Encounter Prescriptions as of 04/14/2010  Medication Sig Dispense Refill  . atenolol (TENORMIN) 50 MG tablet Take 50 mg by mouth 2 (two) times daily.        [name] glipiZIDE (GLUCOTROL) 10 MG tablet Take 10 mg by mouth 2 (two) times daily before a meal.        . metFORMIN (GLUCOPHAGE) 500 MG tablet Take 500 mg by mouth 2 (two) times daily with a meal.        . simvastatin (ZOCOR) 40 MG tablet Take 40 mg by mouth at bedtime.        . vitamin B-12 (CYANOCOBALAMIN) 1000 MCG tablet Take 1,000 mcg by mouth daily.    NOTE: she will change this to 1000u VitD daily    . warfarin (COUMADIN) 5 MG tablet Take 5 mg by mouth as directed.        . Diclofenac Epolamine (FLECTOR) 1.3 % PTCH Place 1 patch onto the skin daily as needed.        . gabapentin (NEURONTIN) 100 MG capsule Take 100 mg by mouth 3 (three) times daily.                Allergies  Allergen Reactions  . Azithromycin     REACTION: pt states INTOL to ZPak  . Pregabalin     REACTION: pt states INTOL to Lyrica  . Sulfonamide Derivatives     REACTION: SWELLING    Review of Systems        See HPI - all other systems neg except as noted...       The patient complains of weight loss, chest pain, dyspnea on exertion, and muscle weakness.  The patient denies anorexia, fever, weight gain, vision loss, decreased hearing, hoarseness, syncope, peripheral edema, prolonged cough, headaches, hemoptysis, abdominal pain,  melena, hematochezia, severe indigestion/heartburn, hematuria, incontinence, suspicious skin lesions, transient blindness, difficulty walking, depression, unusual weight change, abnormal bleeding, enlarged lymph nodes, and angioedema.     Objective:   Physical Exam      WD, WN, 77 y/o WF in NAD...  GENERAL:  Alert & oriented; pleasant & cooperative... HEENT:  [place]/AT, EOM-wnl, PERRLA, EACs-clear, TMs-wnl, NOSE-clear, THROAT-clear & wnl. NECK:  Supple w/ fairROM; no JVD; normal carotid impulses  w/o bruits; no thyromegaly or nodules palpated; no lymphadenopathy. CHEST:  Clear to P & A; without wheezes/ rales/ or rhonchi; +tender left 11th-12th ribs & costal margin. HEART:  Iregular Rhythm= AFib; without murmurs/ rubs/ or gallops heard... ABDOMEN:  Soft & nontender; normal bowel sounds; no organomegaly or masses detected. EXT: without deformities, mild arthritic changes; no varicose veins/ venous insuffic/ or edema. NEURO:  CN's intact;  no focal neuro deficits found... DERM:  No lesions noted; no rash etc...   Assessment & Plan:   STROKE>  She has had an interval CVA w/ hosp by Neuro 11/11 [facility] records all reviewed);  She was continued on her Coumadin per DrWillis & followed in [facility];  Stable w/o recurrent cerebral ischemic symptoms...  HBP>  Stable on BBlocker Rx, continue same...  AFIB>  She had abn 2DEcho in [facility] 11/11 & needs review by Cards- she sees DrTaylor & will call for follow up appt...  CHOL>  She wasn't fasting for her yearly FLP & reminded about low fat diet & taking med daily...  DM>  Control has been difficult due to lack of compliance w/ med rx- we reviewed again the importance of regular dosing for her control issues/ dose adjustments...  ORTHO>  Followed by DrNorris & she says improved on the Neurontin Rx.Marland KitchenMarland Kitchen

## 2010-04-15 ENCOUNTER — Encounter: Payer: Medicare Other | Admitting: *Deleted

## 2010-04-15 LAB — TSH: TSH: 2.89 u[IU]/mL (ref 0.35–5.50)

## 2010-04-20 ENCOUNTER — Ambulatory Visit (INDEPENDENT_AMBULATORY_CARE_PROVIDER_SITE_OTHER): Payer: Medicare Other | Admitting: *Deleted

## 2010-04-20 DIAGNOSIS — I4891 Unspecified atrial fibrillation: Secondary | ICD-10-CM

## 2010-04-20 DIAGNOSIS — Z7901 Long term (current) use of anticoagulants: Secondary | ICD-10-CM

## 2010-04-20 NOTE — Patient Instructions (Signed)
INR 2.2 Continue taking 1 tablet (5mg ) daily, except take 1/2 tablet (2.5mg ) on Mondays and Fridays. Recheck in 4 weeks.

## 2010-04-21 ENCOUNTER — Encounter: Payer: Self-pay | Admitting: Pulmonary Disease

## 2010-05-04 ENCOUNTER — Other Ambulatory Visit: Payer: Self-pay | Admitting: Internal Medicine

## 2010-05-17 ENCOUNTER — Telehealth: Payer: Self-pay | Admitting: Pulmonary Disease

## 2010-05-17 NOTE — Telephone Encounter (Signed)
ATC NA unable to leave VM WCB 

## 2010-05-18 ENCOUNTER — Telehealth: Payer: Self-pay | Admitting: Pulmonary Disease

## 2010-05-18 ENCOUNTER — Ambulatory Visit (INDEPENDENT_AMBULATORY_CARE_PROVIDER_SITE_OTHER): Payer: Medicare Other | Admitting: *Deleted

## 2010-05-18 DIAGNOSIS — Z7901 Long term (current) use of anticoagulants: Secondary | ICD-10-CM

## 2010-05-18 DIAGNOSIS — I4891 Unspecified atrial fibrillation: Secondary | ICD-10-CM

## 2010-05-18 MED ORDER — PREDNISONE (PAK) 5 MG PO TABS: ORAL_TABLET | ORAL | Status: DC

## 2010-05-18 NOTE — Telephone Encounter (Signed)
Per documentation in the phone note from yesterday, pred dose pak should be for 6 days.

## 2010-05-18 NOTE — Telephone Encounter (Signed)
Called, spoke with Danford Bad with [facility].  She is aware pred dose pak should be for 6 day pack.  She verbalized understanding of this.

## 2010-05-18 NOTE — Telephone Encounter (Signed)
Called and spoke with pt and she is aware per SN to take the pred dosepak.  Pt is aware that this med has been sent to her pharmacy

## 2010-05-18 NOTE — Telephone Encounter (Signed)
Spoke w/ pt and she states Dr. Kriste Basque advised her that she has inflammation in her left hip. Pt states the pain is getting worse day by day. Pt is wanting to know what she can do for the pain or if their is something she can take to help w/ the inflammation. Please advise Dr. Kriste Basque. Thanks  Allergies  Allergen Reactions  . Azithromycin     REACTION: pt states INTOL to ZPak  . Pregabalin     REACTION: pt states INTOL to Lyrica  . Sulfonamide Derivatives     REACTION: SWELLING    Carver Fila, CMA

## 2010-05-18 NOTE — Telephone Encounter (Signed)
Per SN---pred dosepak   5 mg    6 day pack.  This is the best to use.  thanks

## 2010-05-25 NOTE — Assessment & Plan Note (Signed)
[facility]                               LIPID CLINIC NOTE   NAME:Michelle Herrera, Michelle Herrera                    MRN:          [id number]  DATE:10/27/2006                            DOB:          09-08-1930    PHONE CALL:  Was at 4:40 in the afternoon.   Michelle Herrera was seen in the anticoagulation clinic earlier today at  which time she requested medication review for nausea and lack of  appetite.  Her chart was received this afternoon.  I have contacted her  via the telephone.  It appears that there are no really significant  findings that could be associated with lack of appetite, other than  possible elevated digoxin concentration or some component of her  diabetic medication regimen.  I have asked her to continue all of her  medications as-is for now.  To let Dr. Kriste Basque and Nurse Practitioner  Parrett make them aware of her lack of appetite when they see her back  and I will order a digoxin concentration for the same day and time as  her upcoming anticoagulation clinic visit on Tuesday, November 4th.  The  patient understands that if she develops nausea or vomiting, if she sees  halos around lights , or other issues, she will contact us immediately  and seek further followup.  And, she will let us know if there are  questions in the meantime.  The questions that she had currently were  answered during our phone call.      Shelby Dubin, PharmD, BCPS, CPP  Electronically Signed      Rollene Rotunda, MD, [facility]  Electronically Signed   MP/MedQ  DD: 10/27/2006  DT: 10/29/2006  Job #: [id number]   cc:   Rubye Oaks, NP  Lonzo Cloud. Kriste Basque, MD

## 2010-05-25 NOTE — Assessment & Plan Note (Signed)
[facility]                         ELECTROPHYSIOLOGY OFFICE NOTE   NAME:Wynns, JAQUELYNE FIRKUS                    MRN:          [id number]  DATE:05/22/2007                            DOB:          01-Oct-1930    Ms. Rotundo  returns today for follow-up.  She is a very pleasant  woman with a history of atrial fibrillation, hypertension and diabetes  who is on Coumadin for thromboembolic prevention who returns today for  follow-up.  In the last year, she has done well. When I saw her a year  ago she was having problems with dizziness and shortness of breath.  She  notes only one episode of shortness of breath which occurred while she  was walking to her son's house.  Since then she has been stable.  She  denies other symptoms and had no specific complaints today.   PHYSICAL EXAM:  She is a pleasant, 77 year old woman in no acute  distress.  The blood pressure today was 112/76, the pulse 62 and  irregularly irregular, respirations were 18, the weight was 137 pounds.  NECK:  Revealed no jugular venous distention.  LUNGS:  Clear bilaterally to auscultation.  No wheezes, rales or rhonchi  are present.  CARDIOVASCULAR:  Irregular regular rhythm with normal S1 and S2.  EXTREMITIES:  Demonstrated no edema.  ABDOMEN:  Soft, nontender, nondistended.  There was no organomegaly.   MEDICATIONS:  1. Coumadin as directed.  2. Glipizide 10 mg daily.  3. Atenolol 50 a day.  4. Metformin 500 twice a day.  5. Digoxin 0.125 daily.  6. Januvia 100 mg daily.   IMPRESSION:  1. Chronic atrial fibrillation.  2. Hypertension.  3. Diabetes.  4. Dyslipidemia.   DISCUSSION:  Overall Ms. Bergeson is stable and her A fib is well-  controlled.  I will see her back in the office for follow-up of her A  fib in 1-2 years.     Doylene Canning. Ladona Ridgel, MD  Electronically Signed    GWT/MedQ  DD: 05/22/2007  DT: 05/22/2007  Job #: [id number]

## 2010-05-25 NOTE — Assessment & Plan Note (Signed)
[facility]                         ELECTROPHYSIOLOGY OFFICE NOTE   NAME:Michelle Herrera, Michelle Herrera                    MRN:          [id number]  DATE:06/02/2006                            DOB:          02-09-1930    Michelle Herrera returns today for followup of her atrial fibrillation.  She is a very pleasant, elderly woman with chronic atrial fibrillation,  whom I saw most recently back in January.  At that time, she was having  symptomatic palpitations with her atrial fibrillation, particularly with  exertion.  It was my sense at that time that she was having fairly rapid  ventricular response and A-fib, based on my walking her in our office  and watching her heart rate go up to 110 beats per minute quite quickly.  At that time, we recommended that she increase her Atenolol from 50 mg a  day to 50 mg in the morning and 25 mg in the evening.  She returns today  for followup.   The patient notes, in the interim, that she has fewer palpitations,  although they do occasionally occur.  She denies chest pain.  She denies  shortness of breath.  She denies peripheral edema.  She has had no  syncope.   PHYSICAL EXAMINATION:  She is a pleasant, well-appearing, elderly woman,  in no acute distress.  The blood pressure today was 120/74, the pulse  was 75 and irregularly irregular, respirations were 18 and the weight  was 140 pounds.  NECK:  Revealed no jugular venous distention.  There was no thyromegaly.  LUNGS:  Clear bilaterally to auscultation.  There were no wheezes, rales  or rhonchi.  CARDIOVASCULAR EXAM:  Revealed an irregularly irregular rhythm with  normal S1 and S2.  EXTREMITIES:  Demonstrated no cyanosis, clubbing or edema.   The EKG demonstrates atrial fibrillation with nonspecific T-wave  abnormality.   MEDICATIONS INCLUDE:  1. Atenolol 50 mg in the morning and 25 in the evening.  2. Digoxin 0.125 mg daily.  3. Metformin 500 mg twice daily.  4.  Glipizide 10 mg daily.  5. Coumadin as directed.   IMPRESSION:  1. Symptomatic atrial fibrillation.  2. Hypertension.  3. Dyslipidemia.  4. Diabetes.   DISCUSSION:  Michelle Herrera palpitations are improved.  However, she  continues to have them and I have recommended that we up-titrate her  dose of Atenolol to 100 mg in two divided doses daily.  Ultimately, this  may be more than she will tolerate, so we will cut her dose back.  However, I suspect that her palpitations will be much reduced.  I plan  to see her back in the  office in one year, unless she has worsening symptoms, palpitations, or  fatigue or weakness on the extra Atenolol.     Doylene Canning. Ladona Ridgel, MD  Electronically Signed    GWT/MedQ  DD: 06/02/2006  DT: 06/02/2006  Job #: [id number]

## 2010-05-28 NOTE — Discharge Summary (Signed)
NAME:  Michelle Herrera, Michelle Herrera                       ACCOUNT NO.:  [id number]   MEDICAL RECORD NO.:  [id number]                   PATIENT TYPE:  INP   LOCATION:  3707                                 FACILITY:  [facility]   PHYSICIAN:  Pricilla Riffle, M.D.                 DATE OF BIRTH:  09/02/1930   DATE OF ADMISSION:  08/11/2003  DATE OF DISCHARGE:  08/14/2003                                 DISCHARGE SUMMARY   DISCHARGE DIAGNOSIS:  Atrial fibrillation.   PAST MEDICAL HISTORY:  1.  Atrial fibrillation with amiodarone therapy.  2.  Diabetes.  3.  Anticoagulation therapy.  4.  Hyperlipidemia.   HISTORY OF PRESENT ILLNESS:  This is a 77 year old female who is followed by  Dr. Dietrich Pates in our office.  She has a history of atrial fibrillation and  abnormal thyroid levels.  Dr. Tenny Craw had seen the patient and recommended  stopping the amiodarone.  Her atrial fibrillation in the past has run from  83-155 with an average rate of 105 with occasional PVCs.  She had recently  started on Atenolol 25 mg p.o. q.d. to slow her heart rate.  The patient  complained of not having any energy.  No acute distress from the atrial  fibrillation.  It was decided after speaking with the patient by Dr. Tenny Craw,  to discontinue the amiodarone, get a level checked and bring the patient to  the hospital and initiate Tikosyn therapy.  She continued the Atenolol at  37.5 mg p.o. q.d., but she began to feel dizzy with that and dose was  decreased to 25 mg again.   HOSPITAL COURSE:  On August 1, she was admitted to Dr. Dietrich Pates' services  with diagnosis of atrial fibrillation.  She was placed on a diabetic, low  fat diet with a Hep-lock initiated.  EKG was done showing atrial  fibrillation at controlled rate of 82.  A BNP, CBC, magnesium level, PT and  PTT were done.  Coumadin was also continued per pharmacy protocol.  Lipitor  20 mg, calcium 600 mg b.i.d., multivitamin, Atenolol 50 mg, Glipizide 10 mg  and [place] p.r.n.  orders were also done.  An accurate height and weight  were recorded and EKG was also obtained for an accurate QTC interval.  Pharmacy saw the patient and continued 2.5 mg of Coumadin 1/2 tablets every  Monday and Wednesday and 2.5 mg all other days.  PT was ordered for Monday,  Wednesday and Friday mornings.  The patient's vital signs with a temperature  of 98.3, blood pressure consistent between 104-123 systolic and a diastolic  in the 11B.  Heart rate ran from the high 70s to the low 80s.  Respirations  were 16-20 and oxygen levels 96-100% on room air.   The patient's lab work with WBC 5.1, hemoglobin 12.6, hematocrit 39.7 and  platelets 268.  Her PT initially was 23.2 and INR 2.7.  At discharge, PT was  23.9 with an INR of 2.9.  Her sodium ran 139, potassium 4.2, glucose  slightly elevated at 94-139, BUN 11, creatinine 0.7 and magnesium of 2.2.  Tikosyn orders were initiated by Dr. Tenny Craw.  A QT interval was measured by  Dr. Andee Lineman from [phone].  Initially, nursing staff had reported QTC at 0.509  and Tikosyn was held.  After morning dose was reevaluated by Dr. Andee Lineman with  a 12-lead and calculated at these numbers stated above, it was decided to  continue the Tikosyn therapy.  An amiodarone level was obtained prior to  this with a 0.3 level in July 2005, prior to initiating Tikosyn.  The  patient continued to stay in atrial fibrillation and tolerated the Tikosyn.  QTC was in acceptable range.  It was decided to discharge the patient home.  She was seen on August 4, by Dr. Tenny Craw and denied any chest pain with no  shortness of breath.  Heart rate was 70-90s with lungs clear to  auscultation.  Cardiac exam was irregularly, irregular with S1, S2.  Her QTC  at that time averaged 0.51.  It was decided to discharge the patient and  continue rate controlled medications.   FOLLOW UP:  She would follow up with Dr. Tenny Craw in the clinic on September 9,  at 11:15 a.m.   DISCHARGE MEDICATIONS:   Discharged with previous medications along with  Atenolol and Tylenol as tolerated for pain.   ACTIVITY:  As tolerated.   DIET:  Low fat, diabetic diet.   SPECIAL INSTRUCTIONS:  Call for questions or concerns to our office.  She  was scheduled for Coumadin Clinic visit on September 9, at 11 a.m.      Dorian Pod, NP                       Pricilla Riffle, M.D.    MB/MEDQ  D:  09/11/2003  T:  09/13/2003  Job:  [id number]

## 2010-05-28 NOTE — Assessment & Plan Note (Signed)
[facility]                            CARDIOLOGY OFFICE NOTE   NAME:Michelle Herrera, Michelle Herrera                    MRN:          [id number]  DATE:02/02/2006                            DOB:          [birth year]    CARDIOLOGIST:  Dr. Dietrich Pates.   ELECTROPHYSIOLOGIST:  Dr. Lewayne Bunting.   PRIMARY CARE PHYSICIAN:  Dr. Alroy Dust   HISTORY OF PRESENT ILLNESS:  Michelle Herrera is a very pleasant 77-year-  old female patient, followed by Dr. Tenny Craw and Dr. Ladona Ridgel with a history  of permanent atrial fibrillation with variable ventricular response,  status post failed Tikosyn and amiodarone therapy who presents to the  office today for followup on palpitations.  She recently saw Dr. Kriste Basque  who referred her over.  She is noting palpitations with exertion that  she has had for years.  There really has been no change there.  It is  just becoming more and more frustrating for her and she is unable to do  her regular activities of daily living.  Anytime she tries to do  something exertional, she becomes very short of breath with her  palpitations.  She denies any chest pain.  She denies any syncope,  orthopnea or paroxysmal dyspnea.  Denies any lower extremity edema.   MEDICATIONS:  1. Coumadin as directed.  2. Glipizide 10 mg daily.  3. Atenolol 50 mg daily.  4. Simvastatin 40 mg nightly.  5. Metformin 500 mg b.i.d.  6. Digitek 0.125 mg daily.   PHYSICAL EXAMINATION:  She is a well-nourished, well-developed female in  no distress.  Blood pressure is 130/80, pulse 64, weight 148 pounds.  HEENT:  Is unremarkable.  NECK:  No JVD.  CARDIAC:  Normal S1, S2.  Irregularly irregular rhythm.  LUNGS:  Are clear to auscultation bilaterally without wheezing, rhonchi  or rales.  ABDOMEN:  Is soft, nontender.  EXTREMITIES:  Without edema.   Electrocardiogram reveals atrial fibrillation with a ventricular rate of  64.  No significant change since previous tracings.   IMPRESSION:  1. Permanent atrial fibrillation with variable ventricular response.      a.     Symptomatic exertional palpitations.      b.     Failed Tikosyn and amiodarone therapy.  2. Stress Myoview study September 2006 negative for ischemia.  3. Ejection fraction 54% by nuclear study in 2006.  4. Diabetes mellitus.  5. Dyslipidemia.  6. Hypertension.  7. Mitral valve prolapse with moderate mitral regurgitation by      echocardiogram September 2002.   PLAN:  The patient presents to the office today for followup on  exertional palpitations.  These are the same palpitations she has had in  the past with her atrial fibrillation.  As noted above, she has failed  Tikosyn and amiodarone therapy.  At rest her rate is very well  controlled and she actually had a Holter monitor performed by Dr. Ladona Ridgel  back late last year.  This demonstrated  2-1/2 second pauses with the lowest heart rate in the 50s, an average  heart rate of 60 per minute, and a  maximal heart rate of 104.  She is  quite symptomatic with this and it is interfering with her activities of  daily living.  Dr. Ladona Ridgel is in the office this afternoon and I have  recommended that she see him.  Dr. Ladona Ridgel has mentioned the possibility  of AVN ablation and pacemaker in the past.  She is agreeable to see Dr.  Ladona Ridgel this afternoon for further recommendations regarding treatment of  her atrial fibrillation.      Tereso Newcomer, PA-C  Electronically Signed      Luis Abed, MD, [facility]  Electronically Signed   SW/MedQ  DD: 02/02/2006  DT: 02/02/2006  Job #: [id number]   cc:   Lonzo Cloud. Kriste Basque, MD

## 2010-05-28 NOTE — Procedures (Signed)
NAME:  Michelle Herrera, Michelle Herrera             ACCOUNT NO.:  [id number]   MEDICAL RECORD NO.:  [id number]          PATIENT TYPE:  OUT   LOCATION:  SLEEP CENTER                 FACILITY:  [facility]   PHYSICIAN:  Marcelyn Bruins, M.D. [facility] DATE OF BIRTH:  [birth year]   DATE OF ADMISSION:  10/02/2003                              NOCTURNAL POLYSOMNOGRAM   REFERRING PHYSICIAN:  Dr. Dietrich Pates   INDICATION FOR STUDY:  Hypersomnia with sleep apnea.   SLEEP ARCHITECTURE:  The patient had a total sleep time of 363 minutes with  decreased REM and no slow wave sleep.  Sleep onset latency was normal and  REM latency was quite prolonged.  Sleep efficiency was 75%.   IMPRESSION:  1.  Very mild obstructive sleep apnea/hypopnea syndrome with a respiratory      disturbance index of five events per hour and O2 desaturations as low at      87%.  Events were clearly worse in the supine position.  2.  Moderate intermittent snoring noted throughout the study.  3.  Atrial fibrillation noted with a controlled ventricular response.  There      were times when the patient had premature ventricular contractions often      in the form of bigemini.  4.  Very large numbers of leg jerks with significant sleep disruption.      Clinical correlation is suggested.      KC/MEDQ  D:  10/23/2003 15:52:11  T:  10/23/2003 16:25:37  Job:  [id number]

## 2010-05-28 NOTE — Assessment & Plan Note (Signed)
[facility]                         ELECTROPHYSIOLOGY OFFICE NOTE   NAME:Herrera, Michelle ROLLAND                    MRN:          [id number]  DATE:02/02/2006                            DOB:          1930-05-19    Michelle Herrera returns today after a long absence from the EP clinic.  She is a very pleasant 77 year old woman with a history of symptomatic  tachybrady syndrome and chronic atrial fibrillation, having failed  multiple antiarrhythmic drug therapies including amiodarone and Tikosyn.  The patient has hypertension and dyspnea on exertion which is thought to  be multifactorial.  She also has chronic diastolic heart failure.  The  patient states that when she exerts herself she still has problems with  dyspnea.  She is, however, limited as much as anything by problems with  pain in her leg when she walks.  She has chronic arthritis, particularly  in her hip.  She otherwise has no specific complaints.  Associated with  her dyspnea is the sensation that her heart is beating hard and fast.   EXAMINATION:  GENERAL:  She is a pleasant 77 year old woman in no acute  distress.  VITAL SIGNS:  The blood pressure today was 130/80, the pulse was 64 and  irregularly irregular, respirations were 18, the weight was 148 pounds.  NECK:  Revealed 5 cm jugular venous distention.  There was no  thyromegaly.  Trachea was midline.  The carotids were 2+ and symmetric.  LUNGS:  Clear bilaterally to auscultation.  There were no wheezes, rales  or rhonchi.  CARDIOVASCULAR:  Revealed an irregularly irregular rhythm with normal S1  and S2.  EXTREMITIES:  Demonstrated no cyanosis, clubbing or edema.   The EKG demonstrates atrial fibrillation with a controlled ventricular  response.   The patient was walked in our office for approximately 100 yards.  During that time her resting heart rate went from 65 beats per minute up  to 110 beats per minute.  She noted palpitations and  some fatigue with  this.  Of note, this was a pulse rate and her electrical rate may have  been somewhat higher as she has atrial fibrillation and could have had a  significant pulse deficit.  Oxygen saturation did not go below 95%.   IMPRESSION:  1. Chronic atrial fibrillation.  2. Symptomatic tachypalpitations.  3. Dyspnea with exertion.  4. Arthritis.  5. Chronic Coumadin therapy.   DISCUSSION:  [name] symptoms are fairly difficult to treat but  I think we should try to increase her dose of beta blocker slightly and  in this way I have asked that she increase her dose of atenolol from 50  mg daily to 50 mg in the morning and 25 mg in the evening.  Additional  increased doses of drugs may be required.  I will plan to see her back  in the office in  approximately 3 months.  She is instructed to call us if she has a bad  reaction to up-titration of her atenolol.     Doylene Canning. Ladona Ridgel, MD  Electronically Signed    GWT/MedQ  DD: 02/02/2006  DT: 02/02/2006  Job #: [id number]

## 2010-05-28 NOTE — H&P (Signed)
NAME:  Michelle Herrera, Michelle Herrera                       ACCOUNT NO.:  [id number]   MEDICAL RECORD NO.:  [id number]                   PATIENT TYPE:  INP   LOCATION:  3707                                 FACILITY:  [facility]   PHYSICIAN:  Pricilla Riffle, M.D.                 DATE OF BIRTH:  1930/06/02   DATE OF ADMISSION:  08/11/2003  DATE OF DISCHARGE:                                HISTORY & PHYSICAL   IDENTIFYING INFORMATION/JUSTIFICATION FOR ADMISSION AND CARE:  Michelle Herrera  is a 77 year old woman with a history of atrial fibrillation who is being  admitted for Tikosyn loading.   HISTORY OF PRESENT ILLNESS:  The patient has a history of atrial  fibrillation for several years. She has been maintained on Amiodarone  therapy but reverted to atrial fibrillation back in January of this year.  She has not tolerated it well. She has discontinued Amiodarone therapy and  plan is for cardioversion. Note, Amiodarone was less than 0.3.   CURRENT MEDICATIONS:  Coumadin as directed, Lipitor 20 mg q.d., calcium  q.d., multivitamin q.d., Atenolol 25 q.d., and Glipizide 10 q.d.   PAST MEDICAL HISTORY:  1. Atrial fibrillation.  2. Dyslipidemia.  3. Hypertension.  4. Diabetes.   SOCIAL HISTORY:  The patient does not smoke and does not drink.   FAMILY HISTORY:  Noncontributory.   PHYSICAL EXAMINATION:  As per physicians assistant.   IMPRESSION:  Michelle Herrera presents on admission to [facility] for  Tikosyn loading and cardioversion, hopefully. The plan has already been  discussed with the patient prior to admission. Amiodarone was discontinued  and has sufficiently washed out of her system.                                                Pricilla Riffle, M.D.    PVR/MEDQ  D:  08/12/2003  T:  08/13/2003  Job:  [id number]

## 2010-06-15 ENCOUNTER — Ambulatory Visit (INDEPENDENT_AMBULATORY_CARE_PROVIDER_SITE_OTHER): Payer: Medicare Other | Admitting: *Deleted

## 2010-06-15 DIAGNOSIS — I4891 Unspecified atrial fibrillation: Secondary | ICD-10-CM

## 2010-06-15 DIAGNOSIS — Z7901 Long term (current) use of anticoagulants: Secondary | ICD-10-CM

## 2010-06-15 LAB — POCT INR: INR: 3

## 2010-06-30 ENCOUNTER — Encounter: Payer: Self-pay | Admitting: Internal Medicine

## 2010-07-12 ENCOUNTER — Other Ambulatory Visit: Payer: Self-pay | Admitting: Internal Medicine

## 2010-07-12 ENCOUNTER — Telehealth: Payer: Self-pay | Admitting: Pulmonary Disease

## 2010-07-12 MED ORDER — CIPROFLOXACIN HCL 250 MG PO TABS: ORAL_TABLET | ORAL | Status: AC

## 2010-07-12 NOTE — Telephone Encounter (Signed)
Called, spoke with pt.  She c/o burning sensation when urinating x 1 wk.  Denies difficulty urinating, odor, or pain in sides/lower back.  Has increased water intake but no relief.  Requesting SN's recs.  [name].  Allergies verified.  Dr. Kriste Basque, pls advise. Thanks!  Allergies  Allergen Reactions  . Azithromycin     REACTION: pt states INTOL to ZPak  . Pregabalin     REACTION: pt states INTOL to Lyrica  . Sulfonamide Derivatives     REACTION: SWELLING

## 2010-07-12 NOTE — Telephone Encounter (Signed)
Addended by: Gweneth Dimitri D on: 07/12/2010 12:13 PM   Modules accepted: Orders

## 2010-07-12 NOTE — Telephone Encounter (Signed)
ATC # provided to inform pt cipro rx sent but NA and unable to leave message

## 2010-07-12 NOTE — Telephone Encounter (Signed)
Per SN----please call in cipro 250mg    #14   1 po bid until gone.  thanks

## 2010-07-13 ENCOUNTER — Ambulatory Visit (INDEPENDENT_AMBULATORY_CARE_PROVIDER_SITE_OTHER): Payer: Medicare Other | Admitting: *Deleted

## 2010-07-13 DIAGNOSIS — I4891 Unspecified atrial fibrillation: Secondary | ICD-10-CM

## 2010-07-13 DIAGNOSIS — Z7901 Long term (current) use of anticoagulants: Secondary | ICD-10-CM

## 2010-07-13 LAB — POCT INR: INR: 3.7

## 2010-07-30 ENCOUNTER — Ambulatory Visit (INDEPENDENT_AMBULATORY_CARE_PROVIDER_SITE_OTHER): Payer: Medicare Other | Admitting: *Deleted

## 2010-07-30 DIAGNOSIS — I4891 Unspecified atrial fibrillation: Secondary | ICD-10-CM

## 2010-07-30 DIAGNOSIS — Z7901 Long term (current) use of anticoagulants: Secondary | ICD-10-CM

## 2010-07-30 LAB — POCT INR: INR: 2.5

## 2010-08-11 ENCOUNTER — Encounter: Payer: Self-pay | Admitting: Pulmonary Disease

## 2010-08-16 ENCOUNTER — Other Ambulatory Visit (INDEPENDENT_AMBULATORY_CARE_PROVIDER_SITE_OTHER): Payer: Medicare Other

## 2010-08-16 ENCOUNTER — Ambulatory Visit: Payer: Medicare Other | Admitting: Internal Medicine

## 2010-08-16 ENCOUNTER — Encounter: Payer: Self-pay | Admitting: Pulmonary Disease

## 2010-08-16 ENCOUNTER — Ambulatory Visit (INDEPENDENT_AMBULATORY_CARE_PROVIDER_SITE_OTHER): Payer: Medicare Other | Admitting: Pulmonary Disease

## 2010-08-16 DIAGNOSIS — E7800 Pure hypercholesterolemia, unspecified: Secondary | ICD-10-CM

## 2010-08-16 DIAGNOSIS — Z8673 Personal history of transient ischemic attack (TIA), and cerebral infarction without residual deficits: Secondary | ICD-10-CM

## 2010-08-16 DIAGNOSIS — I509 Heart failure, unspecified: Secondary | ICD-10-CM

## 2010-08-16 DIAGNOSIS — K449 Diaphragmatic hernia without obstruction or gangrene: Secondary | ICD-10-CM

## 2010-08-16 DIAGNOSIS — M1990 Unspecified osteoarthritis, unspecified site: Secondary | ICD-10-CM

## 2010-08-16 DIAGNOSIS — N3946 Mixed incontinence: Secondary | ICD-10-CM

## 2010-08-16 DIAGNOSIS — E119 Type 2 diabetes mellitus without complications: Secondary | ICD-10-CM

## 2010-08-16 DIAGNOSIS — D126 Benign neoplasm of colon, unspecified: Secondary | ICD-10-CM

## 2010-08-16 DIAGNOSIS — I10 Essential (primary) hypertension: Secondary | ICD-10-CM

## 2010-08-16 DIAGNOSIS — M810 Age-related osteoporosis without current pathological fracture: Secondary | ICD-10-CM

## 2010-08-16 DIAGNOSIS — I4891 Unspecified atrial fibrillation: Secondary | ICD-10-CM

## 2010-08-16 LAB — HEPATIC FUNCTION PANEL
Albumin: 4.6 g/dL (ref 3.5–5.2)
Total Protein: 7.3 g/dL (ref 6.0–8.3)

## 2010-08-16 LAB — LIPID PANEL
HDL: 89 mg/dL (ref >39.00)
Triglycerides: 96 mg/dL (ref 0.0–149.0)

## 2010-08-16 LAB — BASIC METABOLIC PANEL
CO2: 27 mEq/L (ref 19–32)
Calcium: 9.5 mg/dL (ref 8.4–10.5)
Creatinine, Ser: 0.7 mg/dL (ref 0.4–1.2)
Glucose, Bld: 114 mg/dL — ABNORMAL HIGH (ref 70–99)

## 2010-08-16 NOTE — Progress Notes (Signed)
Subjective:    Patient ID: Michelle Herrera, female    DOB: 22-Dec-1930, 77 y.o.   MRN: [id number]  HPI 77 y/o WF here for a follow up visit... she has mult medical problems including:    ~  October 13, 2009:  she saw DrNorris for Ortho w/ back & shoulder XRays & rec for PT & shot in shoulder (min help, refuses PT)... notes incr BS w/ shoulder injection & under incr stress (son dx w/ throat cancer)... now c/o pain in left chest wall w/ tender 11th & 12th ribs & costal margin- intol Tramadol, just using Tylenol & we discussed rest/ heat/ rib binder & try Flector patch... she saw DrTaylor for f/u AFib 8/11- stable rate control strategy, no changes made... f/u DM labs today showed BS=322, A1c=8.3 & call to Pharm confirms not taking Metformin or Glipizide regularly!  ~  April 14, 2010:  30mo ROV & she reports [facility] 11/11 by Neuro for "light stroke" w/ full recovery (SEE BELOW) & continued on her Coumadin by DrWillis & no other changes made; requests refill prescriptions today, & we will check non-fasting blood work...  ~  August 16, 2010:  13mo ROV & she reports stable overall> BP controlled & she denies CP, palpit, dizzy, etc; Fasting labs today showed FLP looks good on Simva40, & A1c improved to 7.3 on her ...     She had Urology f/u by [facility] 4/12> for eval urinary incont, prev eval showed sm capacity unstable bladder, tried Detrol w/ some improvement; recently tried Enablex w/o much improvement; she states "I've got an itty-bitty bladder & there's not much he can do"...    She has been followed by DrNorris for Ortho> 3/12 note reviewed, right knee arthritis w/ persist pain s/p prev arthroscopies; she has received cortisone shots & recent series of ?synvisc, but it looks like she may need to consider TKR- she wants to hold off as long as possible...          Problem List:   HYPERTENSION, VALVULAR HEART DISEASE, & Hx of ATRIAL FIBRILLATION - stable on Rx w/ COUMADIN (followed in clinic), &  ATENOLOL 50mg Bid... her BP=116/84 and she feels OK- denies HA, fatigue, visual changes, CP, palipit, dizziness, syncope, dyspnea, edema, etc... ~  4/11:  she reports that she stopped her Lanoxin 0.125 on her own & doesn't want to restart. ~  8/11:  f/u DrTaylor & stable on Coumadin w/ rate control strategy, no changes made. ~  11/11:  Hosp by Neuro w/ TIA> 2DEcho showed mild LVH, diffuse HK w/ EF= 45%, thick pos MV leaflet w/ restrict motion & modMR, dilated LA&RA, severeTR & PAsys=49, can't r/o PFO...  HYPERCHOLESTEROLEMIA - on diet + SIMVASTATIN 40mg /d... ~  FLP 2007 showed TChol 160, TG 143, HDL 55, LDL 76 ~  FLP 7/09 showed TChol 165, TG 129, HDL 60, LDL 80 ~  FLP 10/10 showed TChol 175, TG 68, HDL 80, LDL 82 ~  FLP 4/11 showed TChol 207, TG 104, HDL 84, LDL 111 ~  FLP 8/11 on Simva40 showed TChol 196, TG 96, HDL 89, LDL 88  DM - on METFORMIN 500mg - 2tabsBid, GLIPZIDE 10mg Bid (not taking Januvia due to $$)... ~  labs 1/09 showed BS= 148, HgA1c= 7.1.Marland KitchenMarland Kitchen continue meds + better diet... ~  labs 7/09 showed BS= 168, HgA1c= 7.6.Marland KitchenMarland Kitchen incr Metform 2Bid, contin Glip 10Bid & Januv... ~  she stopped the Januvia due to cost... ~  labs 4/10 showed BS= 185, Aic= 8.0.Marland KitchenMarland Kitchen reminder  to take meds regularly. ~  labs 10/10 on Metform2Bid+Glip10Bid showed BS= 124, A1c= 6.7 ~  labs 4/11 showed BS= 162, A1c= 6.8.[name]. rec> better diet, take meds regularly. ~  labs 10/11 showed BS= 322, A1c= 8.3.Marland KitchenMarland Kitchen Pharm confirms not taking meds regularly!!! Discussed w/ pt... ~  Labs 4/12 showed BS= 91, A1c= 7.9.Marland KitchenMarland Kitchen Rec> keep same meds, take regularly, better low carb diet... ~  Labs 8/12 on Metform2Bid+Glip10Bid showed BS= 114, A1c= 7.3.Marland KitchenMarland Kitchen Continue same...  HIATAL HERNIA (ICD-553.3) - last EGD by DrStark was 3/03 and showed a HH, reflux...  COLONIC POLYPS (ICD-211.3) - last colonoscopy 3/03 by DrStark showed divertics and 5mm polyp (no path avail).  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 pt reports that there is nothing they can do to help... ~  she has had several UTI's... then perineal rash & saw GYN= neg PAP & Rx yeast infection- resolved.  DEGENERATIVE JOINT DISEASE (ICD-715.90) - she needs right TKR but wants to put this off as long as poss... ~  4/12:  Ortho eval by DrNorris, hx prev knee arthroscopies, given Cortisone shot, & he plans ?Synvisc series...  BACK PAIN, LUMBAR (ICD-724.2) - XRays showed scoliosis, facet degen arthritis, & osteopenia...  she has used ROBAXIN Prn... ~  4/12:  She reports eval from DrNorris w/ "arthritis all down my backbone" & he rec epid steroid injection, but she reports improved on NEURONTIN 100mg  Tid...  SHOULDER PAIN, RIGHT (ICD-719.41) - prev eval & Rx by DrRendall... s/p right shoulder surg 3/09... XRays showed calcium deposit, rotator cuff prob, & intol to Tramadol...  OSTEOPOROSIS (ICD-733.00) ~  labs 10/10 w/ Vit D level = 32... rec> start Vit D OTC 1000 u daily.  MEMORY LOSS (ICD-780.93) - concern for her memory expressed 7/09 OV- tried Aricept but she stopped it- "no different". TIA/ INFARCT - she was [facility] 11/11 by DrWillis w/ left sided weakness, ?left facial droop, & MRI showed 2 sm infarcts on right (frontal & ?pontine);  MRA was neg;  CDopplers were neg;  Deficits all cleared rapidly;  2DEcho was abn- see above;  She was continued on her Coumadin Rx- no changes made...  ANXIETY (ICD-300.00) - stress in family... 2 y/o granddau w/ seizures...  VITAMIN B12 DEFICIENCY >> on OTC Vit B-12 tabs orally> 1033mcg/d... ~  Labs 7/11 showed Vit V12 level = 135... Started on Vit B12 supplement w/ 1073mcg/d...   Past Surgical History  Procedure Date  . Cholecystectomy     Outpatient Encounter Prescriptions as of 08/16/2010  Medication Sig Dispense Refill  . atenolol (TENORMIN) 50 MG tablet Take 50 mg by mouth 2 (two) times daily.        . Diclofenac Epolamine (FLECTOR) 1.3 %  PTCH Place 1 patch onto the skin daily as needed.        . gabapentin (NEURONTIN) 100 MG capsule Take 100 mg by mouth 3 (three) times daily.        [name] glipiZIDE (GLUCOTROL) 10 MG tablet Take 10 mg by mouth 2 (two) times daily before a meal.        . glucose blood test strip Use as directed       . metFORMIN (GLUCOPHAGE) 500 MG tablet Take 2 tablets (1,000 mg total) by mouth 2 (two) times daily with a meal.  120 tablet  6  . simvastatin (ZOCOR) 40 MG tablet Take 40 mg by mouth at bedtime.        [name]  traMADol (ULTRAM) 50 MG tablet Take 1 tablet (50 mg total) by mouth 3 (three) times daily as needed for pain.  90 tablet  5  . vitamin B-12 (CYANOCOBALAMIN) 1000 MCG tablet Take 1,000 mcg by mouth daily.        [name] warfarin (COUMADIN) 5 MG tablet TAKE AS DIRECTED  30 tablet  3  . DISCONTD: predniSONE, Pak, (STERAPRED) 5 MG TABS Take as directed  1 each  0     Allergies  Allergen Reactions  . Azithromycin     REACTION: pt states INTOL to ZPak  . Pregabalin     REACTION: pt states INTOL to Lyrica  . Sulfonamide Derivatives     REACTION: SWELLING    Review of Systems        See HPI - all other systems neg except as noted... The patient complains of weight loss, chest pain, dyspnea on exertion, and muscle weakness.  The patient denies anorexia, fever, weight gain, vision loss, decreased hearing, hoarseness, syncope, peripheral edema, prolonged cough, headaches, hemoptysis, abdominal pain, melena, hematochezia, severe indigestion/heartburn, hematuria, incontinence, suspicious skin lesions, transient blindness, difficulty walking, depression, unusual weight change, abnormal bleeding, enlarged lymph nodes, and angioedema.     Objective:   Physical Exam      WD, WN, 77 y/o WF in NAD...  GENERAL:  Alert & oriented; pleasant & cooperative... HEENT:  [place]/AT, EOM-wnl, PERRLA, EACs-clear, TMs-wnl, NOSE-clear, THROAT-clear & wnl. NECK:  Supple w/ fairROM; no JVD; normal carotid impulses w/o bruits; no  thyromegaly or nodules palpated; no lymphadenopathy. CHEST:  Clear to P & A; without wheezes/ rales/ or rhonchi; +tender left 11th-12th ribs & costal margin. HEART:  Iregular Rhythm= AFib; without murmurs/ rubs/ or gallops heard... ABDOMEN:  Soft & nontender; normal bowel sounds; no organomegaly or masses detected. EXT: without deformities, mild arthritic changes; no varicose veins/ venous insuffic/ or edema. NEURO:  CN's intact;  no focal neuro deficits found... DERM:  No lesions noted; no rash etc...   Assessment & Plan:   STROKE>  She has had a CVA w/ hosp by Neuro 11/11 [facility] records all reviewed);  She was continued on her Coumadin per DrWillis & followed in [facility];  Stable w/o recurrent cerebral ischemic symptoms...  HBP>  Stable on BBlocker Rx, continue same...  AFIB>  She had abn 2DEcho in [facility] 11/11 & needs review by Cards- she sees DrTaylor & will call for follow up appt...  CHOL>  FLP looks good on the Simva40 + diet rx;  reminded about low fat diet & taking med daily...  DM>  A1c is 7.3 now on same meds (improved)- previously control has been difficult due to lack of compliance w/ med rx- we reviewed again the importance of regular dosing for her control issues/ dose adjustments...  URINARY INCONTINENCE>  Followed by Lysbeth Penner, Urology & she feels that there is nothing they can do to help her; wears pads; offered second opinion & she will consider it...  ORTHO>  Followed by DrNorris & she is s/p cortisone shots in right knee & ?Synvisc series; she says improved on the Neurontin Rx & doesn't want TKR.  Other medical problems as noted... She will continue the Calcium, MVI, Vit D, & Vit B12 regimen.Marland KitchenMarland Kitchen

## 2010-08-16 NOTE — Patient Instructions (Signed)
Today we updated your med list in EPIC...  Continue your current meds the same...  Today we did your follow up fasting blood work...    Please call the PHONE TREE in a few days for your results...    Dial [id number] & when prompted enter your patient number followed by the # symbol...    Your patient number is:  [id number]#  Stay on your low carb diet & increase your exercise program...  Call for any questions...  Let's plan another follow up visit in 4-6 months.Marland KitchenMarland Kitchen

## 2010-08-21 ENCOUNTER — Encounter: Payer: Self-pay | Admitting: Pulmonary Disease

## 2010-08-27 ENCOUNTER — Encounter: Payer: Medicare Other | Admitting: *Deleted

## 2010-09-01 ENCOUNTER — Ambulatory Visit (INDEPENDENT_AMBULATORY_CARE_PROVIDER_SITE_OTHER): Payer: Medicare Other | Admitting: *Deleted

## 2010-09-01 DIAGNOSIS — I4891 Unspecified atrial fibrillation: Secondary | ICD-10-CM

## 2010-09-01 DIAGNOSIS — Z7901 Long term (current) use of anticoagulants: Secondary | ICD-10-CM

## 2010-09-01 LAB — POCT INR: INR: 2.7

## 2010-09-15 ENCOUNTER — Other Ambulatory Visit: Payer: Self-pay | Admitting: Pulmonary Disease

## 2010-09-27 ENCOUNTER — Other Ambulatory Visit: Payer: Self-pay | Admitting: Internal Medicine

## 2010-09-29 ENCOUNTER — Encounter: Payer: Self-pay | Admitting: Internal Medicine

## 2010-09-29 ENCOUNTER — Ambulatory Visit (INDEPENDENT_AMBULATORY_CARE_PROVIDER_SITE_OTHER): Payer: Medicare Other | Admitting: Internal Medicine

## 2010-09-29 ENCOUNTER — Ambulatory Visit (INDEPENDENT_AMBULATORY_CARE_PROVIDER_SITE_OTHER): Payer: Medicare Other | Admitting: *Deleted

## 2010-09-29 VITALS — BP 122/80 | HR 78 | Ht 65.0 in | Wt 136.1 lb

## 2010-09-29 DIAGNOSIS — Z01818 Encounter for other preprocedural examination: Secondary | ICD-10-CM

## 2010-09-29 DIAGNOSIS — Z7901 Long term (current) use of anticoagulants: Secondary | ICD-10-CM

## 2010-09-29 DIAGNOSIS — I10 Essential (primary) hypertension: Secondary | ICD-10-CM

## 2010-09-29 DIAGNOSIS — Z8679 Personal history of other diseases of the circulatory system: Secondary | ICD-10-CM

## 2010-09-29 DIAGNOSIS — I4891 Unspecified atrial fibrillation: Secondary | ICD-10-CM

## 2010-09-29 LAB — POCT INR: INR: 3.2

## 2010-09-29 NOTE — Assessment & Plan Note (Signed)
The patient will need to have her Coumadin stopped 5 days prior to knee surgery and undergo bridging with Lovenox. Our Coumadin clinic we'll arrange this.

## 2010-09-29 NOTE — Assessment & Plan Note (Signed)
Her blood pressure is well controlled. I have encouraged her maintain a low-sodium diet.

## 2010-09-29 NOTE — Assessment & Plan Note (Signed)
Her chronic atrial fibrillation is well controlled. She is minimally at all symptomatic. She will continue with her anti-coagulation. Prior to surgery we'll plan to bridge her with Lovenox.

## 2010-09-29 NOTE — Progress Notes (Signed)
HPI [name] returns today for followup. She is a very pleasant 77 year old woman with a history of chronic atrial fibrillation status post rate control, hypertension, diabetes, and history of TIAs. The patient has fairly severe arthritis and is scheduled for knee surgery. The actual date has not been determined. She denies chest pain, shortness of breath, or peripheral edema. She tries to remain active though she is unable to perform strenuous activity secondary to her painful knee. She has not had syncope. Allergies  Allergen Reactions  . Azithromycin     REACTION: pt states INTOL to ZPak  . Pregabalin     REACTION: pt states INTOL to Lyrica  . Sulfonamide Derivatives     REACTION: SWELLING     Current Outpatient Prescriptions  Medication Sig Dispense Refill  . atenolol (TENORMIN) 50 MG tablet TAKE ONE TABLET BY MOUTH TWICE DAILY  60 tablet  11  . glipiZIDE (GLUCOTROL) 10 MG tablet Take 10 mg by mouth 2 (two) times daily before a meal.        . glucose blood test strip Use as directed       . metFORMIN (GLUCOPHAGE) 500 MG tablet Take 2 tablets (1,000 mg total) by mouth 2 (two) times daily with a meal.  120 tablet  6  . simvastatin (ZOCOR) 40 MG tablet Take 40 mg by mouth at bedtime.        . traMADol (ULTRAM) 50 MG tablet Take 1 tablet (50 mg total) by mouth 3 (three) times daily as needed for pain.  90 tablet  5  . vitamin B-12 (CYANOCOBALAMIN) 1000 MCG tablet Take 1,000 mcg by mouth daily.        [name] warfarin (COUMADIN) 5 MG tablet TAKE AS DIRECTED  30 tablet  3     Past Medical History  Diagnosis Date  . Unspecified essential hypertension   . Congestive heart failure, unspecified   . Personal history of other diseases of circulatory system   . Pure hypercholesterolemia   . Type II or unspecified type diabetes mellitus without mention of complication, not stated as uncontrolled   . Anxiety state, unspecified   . Dysuria   . Rash and other nonspecific skin eruption   .  Acute cystitis   . Diaphragmatic hernia without mention of obstruction or gangrene   . Benign neoplasm of colon   . Other acquired absence of organ   . Mixed incontinence urge and stress (female)(female)   . Osteoarthrosis, unspecified whether generalized or localized, unspecified site   . Lumbago   . Pain in joint, shoulder region   . Osteoporosis, unspecified   . Memory loss     ROS:   All systems reviewed and negative except as noted in the HPI.   Past Surgical History  Procedure Date  . Cholecystectomy      No family history on file.   History   Social History  . Marital Status: Widowed    Spouse Name: N/A    Number of Children: N/A  . Years of Education: N/A   Occupational History  . Not on file.   Social History Main Topics  . Smoking status: Never Smoker   . Smokeless tobacco: Never Used  . Alcohol Use: No  . Drug Use: No  . Sexually Active: Not on file   Other Topics Concern  . Not on file   Social History Narrative   1 sister--in poor health1 brother hx of kidney stones and heart problems  BP 122/80  Pulse 78  Ht 5\' 5"  (1.651 m)  Wt 136 lb 1.9 oz (61.744 kg)  BMI 22.65 kg/m2  Physical Exam:  Well appearing elderly woman, NAD HEENT: Unremarkable Neck:  No JVD, no thyromegally Lymphatics:  No adenopathy Back:  No CVA tenderness Lungs:  Clear with no wheezes, rales, or rhonchi. HEART:  Iregular rate rhythm, no murmurs, no rubs, no clicks Abd:  soft, positive bowel sounds, no organomegally, no rebound, no guarding Ext:  2 plus pulses, no edema, no cyanosis, no clubbing Skin:  No rashes no nodules Neuro:  CN II through XII intact, motor grossly intact  EKG Atrial fibrillation with a controlled ventricular response.  Assess/Plan:

## 2010-09-29 NOTE — Assessment & Plan Note (Signed)
The patient is a satisfactory surgical candidate. She is low risk for major peri-operative palpitations.

## 2010-09-29 NOTE — Patient Instructions (Signed)
Your physician wants you to follow-up in: 12 months with Dr Court Joy will receive a reminder letter in the mail two months in advance. If you don't receive a letter, please call our office to schedule the follow-up appointment.   Okay to proceed with surgery and will need a Lovenox bridge

## 2010-10-27 ENCOUNTER — Encounter: Payer: Medicare Other | Admitting: *Deleted

## 2010-11-02 ENCOUNTER — Telehealth: Payer: Self-pay | Admitting: Pulmonary Disease

## 2010-11-02 MED ORDER — AMOXICILLIN-POT CLAVULANATE 875-125 MG PO TABS: 1.0000 | ORAL_TABLET | Freq: Two times a day (BID) | ORAL | Status: AC

## 2010-11-02 MED ORDER — DIPHENHYD-HYDROCORT-NYSTATIN MT SUSP: OROMUCOSAL | Status: DC

## 2010-11-02 NOTE — Telephone Encounter (Signed)
I spoke with pt and she is aware of SN recs. Pt verbalized understanding and is aware rx's was sent to pharmacy. Nothing further was needed

## 2010-11-02 NOTE — Telephone Encounter (Signed)
I spoke with pt and she c/o sore throat, hoarseness, cough w/ brown mucus, and runny nose x 2 days. Pt states she has just been coughing her head off. Pt states she has tried some otc cough medicine but has had no relief. Pt denies any fever, nausea, vomiting, wheezing, chest tightness, sob. Pt is requesting something be called in for her. Please advise Dr. Kriste Basque, thanks  Allergies  Allergen Reactions  . Azithromycin     REACTION: pt states INTOL to ZPak  . Pregabalin     REACTION: pt states INTOL to Lyrica  . Sulfonamide Derivatives     REACTION: SWELLING     Carver Fila, CMA

## 2010-11-02 NOTE — Telephone Encounter (Signed)
Per SN---ok for augmentin 875mg    #14   1 po bid, mmw   #4oz   1 tsp gargle and swallow four times daily as needed, mucinex otc 2 po bid with plenty of fluids.  thanks

## 2010-11-04 ENCOUNTER — Ambulatory Visit (INDEPENDENT_AMBULATORY_CARE_PROVIDER_SITE_OTHER): Payer: Medicare Other | Admitting: *Deleted

## 2010-11-04 DIAGNOSIS — Z7901 Long term (current) use of anticoagulants: Secondary | ICD-10-CM

## 2010-11-04 DIAGNOSIS — I4891 Unspecified atrial fibrillation: Secondary | ICD-10-CM

## 2010-11-04 LAB — POCT INR: INR: 2.3

## 2010-11-11 HISTORY — PX: OTHER SURGICAL HISTORY: SHX169

## 2010-11-26 ENCOUNTER — Telehealth: Payer: Self-pay | Admitting: Pulmonary Disease

## 2010-11-26 NOTE — Telephone Encounter (Signed)
I spoke with pt and she states Dr. Ranell Patrick gave her oxycodone after her knee surgery. Pt states she is needing a refill on this. I advised pt that SN is not in and to call Dr. Ranell Patrick office for a refill. Pt states she didn't even think to call him. Pt states she will do so and needed nothing further

## 2010-12-03 ENCOUNTER — Ambulatory Visit (INDEPENDENT_AMBULATORY_CARE_PROVIDER_SITE_OTHER): Payer: Medicare Other | Admitting: *Deleted

## 2010-12-03 DIAGNOSIS — I4891 Unspecified atrial fibrillation: Secondary | ICD-10-CM

## 2010-12-03 DIAGNOSIS — Z7901 Long term (current) use of anticoagulants: Secondary | ICD-10-CM

## 2010-12-10 ENCOUNTER — Telehealth: Payer: Self-pay | Admitting: Pulmonary Disease

## 2010-12-10 MED ORDER — CIPROFLOXACIN HCL 250 MG PO TABS: 250.0000 mg | ORAL_TABLET | Freq: Two times a day (BID) | ORAL | Status: DC

## 2010-12-10 NOTE — Telephone Encounter (Signed)
I spoke with pt and is aware of NS recs. Pt also aware rx for cipro was sent to pharmacy

## 2010-12-10 NOTE — Telephone Encounter (Signed)
Per SN---ok for pt to have cipro 250mg   #14  1 po bid until gone, increase fluids.  Will need ov if not better.  cipro has already been sent to the pharmacy.  thanks

## 2010-12-10 NOTE — Telephone Encounter (Signed)
ATC x 2. NA. Unable to leave a msg. WCB.

## 2010-12-10 NOTE — Telephone Encounter (Signed)
Spoke with pt. She states that she has had increased, and painful urination x 1 wk. Denies any back or pelvic pain, fever. She states that at first thought this was caused by methocarbamol she was taking, but now thinks that this is a UTI. Would like something called in. Please advise, thanks! Allergies  Allergen Reactions  . Azithromycin     REACTION: pt states INTOL to ZPak  . Pregabalin     REACTION: pt states INTOL to Lyrica  . Sulfonamide Derivatives     REACTION: SWELLING

## 2010-12-14 ENCOUNTER — Other Ambulatory Visit: Payer: Self-pay | Admitting: Internal Medicine

## 2010-12-17 ENCOUNTER — Ambulatory Visit (INDEPENDENT_AMBULATORY_CARE_PROVIDER_SITE_OTHER): Payer: Medicare Other | Admitting: Pulmonary Disease

## 2010-12-17 ENCOUNTER — Other Ambulatory Visit: Payer: Self-pay | Admitting: Pulmonary Disease

## 2010-12-17 DIAGNOSIS — M810 Age-related osteoporosis without current pathological fracture: Secondary | ICD-10-CM

## 2010-12-17 DIAGNOSIS — E7800 Pure hypercholesterolemia, unspecified: Secondary | ICD-10-CM

## 2010-12-17 DIAGNOSIS — I509 Heart failure, unspecified: Secondary | ICD-10-CM

## 2010-12-17 DIAGNOSIS — E119 Type 2 diabetes mellitus without complications: Secondary | ICD-10-CM

## 2010-12-17 DIAGNOSIS — F411 Generalized anxiety disorder: Secondary | ICD-10-CM

## 2010-12-17 DIAGNOSIS — Z23 Encounter for immunization: Secondary | ICD-10-CM

## 2010-12-17 DIAGNOSIS — R531 Weakness: Secondary | ICD-10-CM

## 2010-12-17 DIAGNOSIS — D126 Benign neoplasm of colon, unspecified: Secondary | ICD-10-CM

## 2010-12-17 DIAGNOSIS — D649 Anemia, unspecified: Secondary | ICD-10-CM

## 2010-12-17 DIAGNOSIS — I10 Essential (primary) hypertension: Secondary | ICD-10-CM

## 2010-12-17 DIAGNOSIS — R300 Dysuria: Secondary | ICD-10-CM

## 2010-12-17 DIAGNOSIS — I4891 Unspecified atrial fibrillation: Secondary | ICD-10-CM

## 2010-12-17 DIAGNOSIS — K449 Diaphragmatic hernia without obstruction or gangrene: Secondary | ICD-10-CM

## 2010-12-17 DIAGNOSIS — Z8673 Personal history of transient ischemic attack (TIA), and cerebral infarction without residual deficits: Secondary | ICD-10-CM

## 2010-12-17 DIAGNOSIS — M1990 Unspecified osteoarthritis, unspecified site: Secondary | ICD-10-CM

## 2010-12-17 NOTE — Patient Instructions (Signed)
Today we updated your med list in our EPIC system...    Continue your current medications the same...  We gave you the combination Tetanus/ Whooping cough vaccine today> it should be good for 10 yrs...  We will arrange for a Urology follow up appt due to your burning & incontinence...  Let's plan a follow up visit in 4 motns time w/ CXR & FASTING blood work around that time.Marland KitchenMarland Kitchen

## 2010-12-17 NOTE — Progress Notes (Signed)
Subjective:    Patient ID: Michelle Herrera, female    DOB: 11-Jun-1930, 77 y.o.   MRN: [id number]  HPI 77 y/o WF here for a follow up visit... she has mult medical problems including:    ~  October 13, 2009:  she saw DrNorris for Ortho w/ back & shoulder XRays & rec for PT & shot in shoulder (min help, refuses PT)... notes incr BS w/ shoulder injection & under incr stress (son dx w/ throat cancer)... now c/o pain in left chest wall w/ tender 11th & 12th ribs & costal margin- intol Tramadol, just using Tylenol & we discussed rest/ heat/ rib binder & try Flector patch... she saw DrTaylor for f/u AFib 8/11- stable rate control strategy, no changes made... f/u DM labs today showed BS=322, A1c=8.3 & call to Pharm confirms not taking Metformin or Glipizide regularly!  ~  April 14, 2010:  70mo ROV & she reports [facility] 11/11 by Neuro for "light stroke" w/ full recovery (SEE BELOW) & continued on her Coumadin by DrWillis & no other changes made; requests refill prescriptions today, & we will check non-fasting blood work...  ~  August 16, 2010:  30mo ROV & she reports stable overall> BP controlled & she denies CP, palpit, dizzy, etc; Fasting labs today showed FLP looks good on Simva40, & A1c improved to 7.3 on her ...     She had Urology f/u by [facility] 4/12> for eval urinary incont, prev eval showed sm capacity unstable bladder, tried Detrol w/ some improvement; recently tried Enablex w/o much improvement; she states "I've got an itty-bitty bladder & there's not much he can do"...    She has been followed by DrNorris for Ortho> 3/12 note reviewed, right knee arthritis w/ persist pain s/p prev arthroscopies; she has received cortisone shots & recent series of ?synvisc, but it looks like she may need to consider TKR- she wants to hold off as long as possible...  ~  December 17, 2010:  30mo ROV & she reports a good interval- had a nice Thanksgiving & planning a great Christmas;  No change in meds> BP controlled  on Aten & she denies CP, palpit, dizzy, syncope, SOB, edema, etc;  Hx PAF & she continues on Coumadin followed by DrTaylor w/ rate control strategy;  FLP remains good on Simva40- tol well;  DM control improved on Metform/ Glipiz/ Diet;  She is c/o persist urinary symptoms w/ burning & leaking- followed by DrKimbrough et al;  She has DJD, LBP, Shoulder pain- treated by DrRendall/ DrNorris w/ knee surg 9/12 as outpt for torn cartilage (sl better post-op but requiring Percocet/ Tramadol)...          Problem List:   HYPERTENSION - on ATENOLOL 50mg  Bid... VALVULAR HEART DISEASE   ATRIAL FIBRILLATION - stable on Rx w/ COUMADIN (followed in clinic) & rate control strategy. ~  4/11:  she reports that she stopped her Lanoxin 0.125 on her own & doesn't want to restart. ~  8/11:  f/u DrTaylor & stable on Coumadin w/ rate control strategy, no changes made. ~  11/11:  Hosp by Neuro w/ TIA> 2DEcho showed mild LVH, diffuse HK w/ EF= 45%, thick pos MV leaflet w/ restrict motion & modMR, dilated LA&RA, severeTR & PAsys=49, can't r/o PFO... ~  8/12:  her BP=116/84 and she feels OK- denies HA, fatigue, visual changes, CP, palipit, dizziness, syncope, dyspnea, edema, etc... ~  12/12:  BP= 126/88 and she remains asymptomatic as above...  HYPERCHOLESTEROLEMIA -  on diet + SIMVASTATIN 40mg /d... ~  FLP 2007 showed TChol 160, TG 143, HDL 55, LDL 76 ~  FLP 7/09 showed TChol 165, TG 129, HDL 60, LDL 80 ~  FLP 10/10 showed TChol 175, TG 68, HDL 80, LDL 82 ~  FLP 4/11 showed TChol 207, TG 104, HDL 84, LDL 111 ~  FLP 11/11 on Simva40 showed TChol 180, TG 71, HDL 67, LDL 99 ~  FLP 8/12 on Simva40 showed TChol 196, TG 96, HDL 89, LDL 88  DM - on METFORMIN 500mg - 2tabsBid, GLIPZIDE 10mg Bid (not taking Januvia due to $$)... ~  labs 1/09 showed BS= 148, HgA1c= 7.1.Marland KitchenMarland Kitchen continue meds + better diet... ~  labs 7/09 showed BS= 168, HgA1c= 7.6.Marland KitchenMarland Kitchen incr Metform 2Bid, contin Glip 10Bid & Januv... ~  she stopped the Januvia due to  cost... ~  labs 4/10 showed BS= 185, Aic= 8.0.Marland KitchenMarland Kitchen reminder to take meds regularly. ~  labs 10/10 on Metform2Bid+Glip10Bid showed BS= 124, A1c= 6.7 ~  labs 4/11 showed BS= 162, A1c= 6.8.[name]. rec> better diet, take meds regularly. ~  labs 10/11 showed BS= 322, A1c= 8.3.Marland KitchenMarland Kitchen Pharm confirms not taking meds regularly!!! Discussed w/ pt... ~  Labs 4/12 showed BS= 91, A1c= 7.9.Marland KitchenMarland Kitchen Rec> keep same meds, take regularly, better low carb diet... ~  Labs 8/12 on Metform2Bid+Glip10Bid showed BS= 114, A1c= 7.3.Marland KitchenMarland Kitchen Continue same...  HIATAL HERNIA (ICD-553.3) - last EGD by DrStark was 3/03 and showed a HH, reflux...  COLONIC POLYPS (ICD-211.3) - last colonoscopy 3/03 by DrStark showed divertics and 5mm polyp (no path avail).  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 pt reports that there is nothing they can do to help... ~  she has had several UTI's... then perineal rash & saw GYN= neg PAP & Rx yeast infection- resolved. ~  She will f/u w/ Urology for persistant symptoms...  DEGENERATIVE JOINT DISEASE (ICD-715.90) - she needs right TKR but wants to put this off as long as poss... ~  4/12:  Ortho eval by DrNorris, hx prev knee arthroscopies, given Cortisone shot, & he plans ?Synvisc series... ~  9/12:  outpt knee surg to repair torn cartilage per DrNorris; preop clearance by DrTaylor & Cou[facility]in [facility]...  BACK PAIN, LUMBAR (ICD-724.2) - XRays showed scoliosis, facet degen arthritis, & osteopenia...  she has used ROBAXIN Prn... ~  4/12:  She reports eval from DrNorris w/ "arthritis all down my backbone" & he rec epid steroid injection, but she reports improved on NEURONTIN 100mg  Tid...  SHOULDER PAIN, RIGHT (ICD-719.41) - prev eval & Rx by DrRendall... s/p right shoulder surg 3/09... XRays showed calcium deposit, rotator cuff prob, & intol to Tramadol...  OSTEOPOROSIS (ICD-733.00) ~  labs 10/10 w/ Vit D level = 32...  rec> start Vit D OTC 1000 u daily.  MEMORY LOSS (ICD-780.93) - concern for her memory expressed 7/09 OV- tried Aricept but she stopped it- "no different". TIA/ INFARCT - she was [facility] 11/11 by DrWillis w/ left sided weakness, ?left facial droop, & MRI showed 2 sm infarcts on right (frontal & ?pontine);  MRA was neg;  CDopplers were neg;  Deficits all cleared rapidly;  2DEcho was abn- see above;  She was continued on her Coumadin Rx- no changes made...  ANXIETY (ICD-300.00) - stress in family... 2 y/o granddau w/ seizures...  VITAMIN B12 DEFICIENCY >> on OTC Vit B-12 tabs orally> 1017mcg/d... ~  Labs 7/11 showed Vit V12 level = 135... Started on Vit  B12 supplement w/ 1068mcg/d...   Past Surgical History  Procedure Date  . Cholecystectomy     Outpatient Encounter Prescriptions as of 12/17/2010  Medication Sig Dispense Refill  . atenolol (TENORMIN) 50 MG tablet TAKE ONE TABLET BY MOUTH TWICE DAILY  60 tablet  11  . ciprofloxacin (CIPRO) 250 MG tablet Take 1 tablet (250 mg total) by mouth 2 (two) times daily.  14 tablet  0  . Diphenhyd-Hydrocort-Nystatin SUSP 1 tsp swish and swallow 4 times a day as needed  4 oz  0  . glipiZIDE (GLUCOTROL) 10 MG tablet Take 10 mg by mouth 2 (two) times daily before a meal.        . glucose blood test strip Use as directed       . metFORMIN (GLUCOPHAGE) 500 MG tablet Take 2 tablets (1,000 mg total) by mouth 2 (two) times daily with a meal.  120 tablet  6  . simvastatin (ZOCOR) 40 MG tablet Take 40 mg by mouth at bedtime.        . traMADol (ULTRAM) 50 MG tablet Take 1 tablet (50 mg total) by mouth 3 (three) times daily as needed for pain.  90 tablet  5  . vitamin B-12 (CYANOCOBALAMIN) 1000 MCG tablet Take 1,000 mcg by mouth daily.        [name] warfarin (COUMADIN) 5 MG tablet TAKE AS DIRECTED  30 tablet  3     Allergies  Allergen Reactions  . Azithromycin     REACTION: pt states INTOL to ZPak  . Pregabalin     REACTION: pt states INTOL to Lyrica  . Sulfonamide  Derivatives     REACTION: SWELLING    Review of Systems        See HPI - all other systems neg except as noted... The patient complains of weight loss, chest pain, dyspnea on exertion, and muscle weakness.  The patient denies anorexia, fever, weight gain, vision loss, decreased hearing, hoarseness, syncope, peripheral edema, prolonged cough, headaches, hemoptysis, abdominal pain, melena, hematochezia, severe indigestion/heartburn, hematuria, incontinence, suspicious skin lesions, transient blindness, difficulty walking, depression, unusual weight change, abnormal bleeding, enlarged lymph nodes, and angioedema.     Objective:   Physical Exam      WD, WN, 77 y/o WF in NAD...  GENERAL:  Alert & oriented; pleasant & cooperative... HEENT:  [place]/AT, EOM-wnl, PERRLA, EACs-clear, TMs-wnl, NOSE-clear, THROAT-clear & wnl. NECK:  Supple w/ fairROM; no JVD; normal carotid impulses w/o bruits; no thyromegaly or nodules palpated; no lymphadenopathy. CHEST:  Clear to P & A; without wheezes/ rales/ or rhonchi; +tender left 11th-12th ribs & costal margin. HEART:  Iregular Rhythm= AFib; without murmurs/ rubs/ or gallops heard... ABDOMEN:  Soft & nontender; normal bowel sounds; no organomegaly or masses detected. EXT: without deformities, mild arthritic changes; no varicose veins/ venous insuffic/ or edema. NEURO:  CN's intact;  no focal neuro deficits found... DERM:  No lesions noted; no rash etc...  RADIOLOGY DATA:  Reviewed in the EPIC EMR & discussed w/ the patient...  LABORATORY DATA:  Reviewed in the EPIC EMR & discussed w/ the patient...   Assessment & Plan:   HBP>  Stable on BBlocker Rx, continue same...  AFIB>  She had abn 2[facility]cho in [facility] 11/11 & needs review by Cards- she sees DrTaylor & he saw her 9/12 w/ surg clearance...  CHOL>  FLP looks good on the Simva40 + diet rx;  reminded about low fat diet & taking med daily...  DM>  A1c is  7.3 now on same meds (improved)- previously control has  been difficult due to lack of compliance w/ med rx- we reviewed again the importance of regular dosing for her control issues/ dose adjustments...  URINARY INCONTINENCE>  Followed by Lysbeth Penner, Urology & she feels that there is nothing they can do to help her; wears pads; offered second opinion & she will consider it...  ORTHO>  Followed by DrNorris & she is s/p cortisone shots in right knee & ?Synvisc series; she had arthroscopic surg for torn cartilage 9/12 she says & she doesn't want TKR.  STROKE>  She has had a CVA w/ hosp by Neuro 11/11 [facility] records all reviewed);  She was continued on her Coumadin per DrWillis & followed in [facility];  Stable w/o recurrent cerebral ischemic symptoms...  Other medical problems as noted... She will continue the Calcium, MVI, Vit D, & Vit B12 regimen...   Patient's Medications  New Prescriptions   No medications on file  Previous Medications   ATENOLOL (TENORMIN) 50 MG TABLET    TAKE ONE TABLET BY MOUTH TWICE DAILY   GLUCOSE BLOOD TEST STRIP    Use as directed    METFORMIN (GLUCOPHAGE) 500 MG TABLET    Take 2 tablets (1,000 mg total) by mouth 2 (two) times daily with a meal.   SIMVASTATIN (ZOCOR) 40 MG TABLET    Take 40 mg by mouth at bedtime.     TRAMADOL (ULTRAM) 50 MG TABLET    Take 1 tablet (50 mg total) by mouth 3 (three) times daily as needed for pain.   VITAMIN B-12 (CYANOCOBALAMIN) 1000 MCG TABLET    Take 1,000 mcg by mouth daily.     WARFARIN (COUMADIN) 5 MG TABLET    TAKE AS DIRECTED  Modified Medications   Modified Medication Previous Medication   GLIPIZIDE (GLUCOTROL) 10 MG TABLET glipiZIDE (GLUCOTROL) 10 MG tablet      TAKE ONE TABLET BY MOUTH TWICE DAILY    Take 10 mg by mouth 2 (two) times daily before a meal.    Discontinued Medications   CIPROFLOXACIN (CIPRO) 250 MG TABLET    Take 1 tablet (250 mg total) by mouth 2 (two) times daily.   DIPHENHYD-HYDROCORT-NYSTATIN SUSP    1 tsp swish and swallow 4 times a day as needed

## 2010-12-22 ENCOUNTER — Other Ambulatory Visit: Payer: Self-pay | Admitting: Pulmonary Disease

## 2010-12-31 ENCOUNTER — Encounter: Payer: Medicare Other | Admitting: *Deleted

## 2011-01-06 ENCOUNTER — Encounter: Payer: Self-pay | Admitting: Pulmonary Disease

## 2011-01-10 ENCOUNTER — Ambulatory Visit (INDEPENDENT_AMBULATORY_CARE_PROVIDER_SITE_OTHER): Payer: Medicare Other | Admitting: *Deleted

## 2011-01-10 DIAGNOSIS — Z7901 Long term (current) use of anticoagulants: Secondary | ICD-10-CM

## 2011-01-10 DIAGNOSIS — I4891 Unspecified atrial fibrillation: Secondary | ICD-10-CM

## 2011-01-31 ENCOUNTER — Encounter (HOSPITAL_BASED_OUTPATIENT_CLINIC_OR_DEPARTMENT_OTHER): Payer: Self-pay | Admitting: Family Medicine

## 2011-01-31 ENCOUNTER — Inpatient Hospital Stay (HOSPITAL_BASED_OUTPATIENT_CLINIC_OR_DEPARTMENT_OTHER)
Admission: EM | Admit: 2011-01-31 | Discharge: 2011-02-02 | DRG: 699 | Disposition: A | Discharge status: Home / Self Care | Payer: Medicare Other | Attending: Internal Medicine | Admitting: Internal Medicine

## 2011-01-31 ENCOUNTER — Emergency Department (INDEPENDENT_AMBULATORY_CARE_PROVIDER_SITE_OTHER): Payer: Medicare Other

## 2011-01-31 ENCOUNTER — Other Ambulatory Visit: Payer: Self-pay

## 2011-01-31 DIAGNOSIS — I4821 Permanent atrial fibrillation: Secondary | ICD-10-CM | POA: Diagnosis present

## 2011-01-31 DIAGNOSIS — D126 Benign neoplasm of colon, unspecified: Secondary | ICD-10-CM

## 2011-01-31 DIAGNOSIS — M810 Age-related osteoporosis without current pathological fracture: Secondary | ICD-10-CM

## 2011-01-31 DIAGNOSIS — M25519 Pain in unspecified shoulder: Secondary | ICD-10-CM

## 2011-01-31 DIAGNOSIS — I4891 Unspecified atrial fibrillation: Secondary | ICD-10-CM

## 2011-01-31 DIAGNOSIS — R413 Other amnesia: Secondary | ICD-10-CM

## 2011-01-31 DIAGNOSIS — E119 Type 2 diabetes mellitus without complications: Secondary | ICD-10-CM

## 2011-01-31 DIAGNOSIS — M545 Low back pain, unspecified: Secondary | ICD-10-CM

## 2011-01-31 DIAGNOSIS — N280 Ischemia and infarction of kidney: Secondary | ICD-10-CM

## 2011-01-31 DIAGNOSIS — Z79899 Other long term (current) drug therapy: Secondary | ICD-10-CM

## 2011-01-31 DIAGNOSIS — Z8673 Personal history of transient ischemic attack (TIA), and cerebral infarction without residual deficits: Secondary | ICD-10-CM

## 2011-01-31 DIAGNOSIS — R531 Weakness: Secondary | ICD-10-CM

## 2011-01-31 DIAGNOSIS — R269 Unspecified abnormalities of gait and mobility: Secondary | ICD-10-CM

## 2011-01-31 DIAGNOSIS — R1012 Left upper quadrant pain: Secondary | ICD-10-CM

## 2011-01-31 DIAGNOSIS — R935 Abnormal findings on diagnostic imaging of other abdominal regions, including retroperitoneum: Secondary | ICD-10-CM

## 2011-01-31 DIAGNOSIS — D649 Anemia, unspecified: Secondary | ICD-10-CM

## 2011-01-31 DIAGNOSIS — R791 Abnormal coagulation profile: Secondary | ICD-10-CM

## 2011-01-31 DIAGNOSIS — K449 Diaphragmatic hernia without obstruction or gangrene: Secondary | ICD-10-CM

## 2011-01-31 DIAGNOSIS — I059 Rheumatic mitral valve disease, unspecified: Secondary | ICD-10-CM | POA: Diagnosis present

## 2011-01-31 DIAGNOSIS — Z7982 Long term (current) use of aspirin: Secondary | ICD-10-CM

## 2011-01-31 DIAGNOSIS — R300 Dysuria: Secondary | ICD-10-CM

## 2011-01-31 DIAGNOSIS — I079 Rheumatic tricuspid valve disease, unspecified: Secondary | ICD-10-CM | POA: Diagnosis present

## 2011-01-31 DIAGNOSIS — M1990 Unspecified osteoarthritis, unspecified site: Secondary | ICD-10-CM

## 2011-01-31 DIAGNOSIS — I428 Other cardiomyopathies: Secondary | ICD-10-CM | POA: Diagnosis present

## 2011-01-31 DIAGNOSIS — N2889 Other specified disorders of kidney and ureter: Principal | ICD-10-CM | POA: Diagnosis present

## 2011-01-31 DIAGNOSIS — F411 Generalized anxiety disorder: Secondary | ICD-10-CM

## 2011-01-31 DIAGNOSIS — Z7901 Long term (current) use of anticoagulants: Secondary | ICD-10-CM

## 2011-01-31 DIAGNOSIS — I509 Heart failure, unspecified: Secondary | ICD-10-CM

## 2011-01-31 DIAGNOSIS — I10 Essential (primary) hypertension: Secondary | ICD-10-CM

## 2011-01-31 DIAGNOSIS — E7800 Pure hypercholesterolemia, unspecified: Secondary | ICD-10-CM

## 2011-01-31 DIAGNOSIS — R109 Unspecified abdominal pain: Secondary | ICD-10-CM | POA: Diagnosis present

## 2011-01-31 DIAGNOSIS — G8929 Other chronic pain: Secondary | ICD-10-CM | POA: Diagnosis present

## 2011-01-31 DIAGNOSIS — N3946 Mixed incontinence: Secondary | ICD-10-CM

## 2011-01-31 LAB — COMPREHENSIVE METABOLIC PANEL
AST: 17 U/L (ref 0–37)
CO2: 27 mEq/L (ref 19–32)
Chloride: 98 mEq/L (ref 96–112)
Creatinine, Ser: 0.6 mg/dL (ref 0.50–1.10)
GFR calc Af Amer: >90 mL/min (ref >90)
GFR calc non Af Amer: 85 mL/min — ABNORMAL LOW (ref >90)
Glucose, Bld: 276 mg/dL — ABNORMAL HIGH (ref 70–99)
Total Bilirubin: 0.4 mg/dL (ref 0.3–1.2)

## 2011-01-31 LAB — DIFFERENTIAL
Basophils Relative: 1 % (ref 0–1)
Eosinophils Absolute: 0.1 10*3/uL (ref 0.0–0.7)
Lymphs Abs: 0.9 10*3/uL (ref 0.7–4.0)
Neutrophils Relative %: 77 % (ref 43–77)

## 2011-01-31 LAB — URINALYSIS, ROUTINE W REFLEX MICROSCOPIC
Glucose, UA: >1000 mg/dL — AB
Hgb urine dipstick: NEGATIVE
Ketones, ur: NEGATIVE mg/dL
Protein, ur: NEGATIVE mg/dL
Urobilinogen, UA: 0.2 mg/dL (ref 0.0–1.0)

## 2011-01-31 LAB — CBC
MCH: 31.4 pg (ref 26.0–34.0)
MCHC: 33.5 g/dL (ref 30.0–36.0)
Platelets: 236 10*3/uL (ref 150–400)
RBC: 3.92 MIL/uL (ref 3.87–5.11)

## 2011-01-31 LAB — LACTIC ACID, PLASMA
Lactic Acid, Venous: 3 mmol/L — ABNORMAL HIGH (ref 0.5–2.2)
Lactic Acid, Venous: 1.9 mmol/L (ref 0.5–2.2)

## 2011-01-31 LAB — URINE MICROSCOPIC-ADD ON

## 2011-01-31 LAB — GLUCOSE, CAPILLARY: Glucose-Capillary: 209 mg/dL — ABNORMAL HIGH (ref 70–99)

## 2011-01-31 LAB — URINE CULTURE
Colony Count: NO GROWTH
Culture: NO GROWTH

## 2011-01-31 MED ORDER — SODIUM CHLORIDE 0.9 % IV SOLN
INTRAVENOUS | Status: AC
  Administered 2011-01-31: 23:00:00 via INTRAVENOUS

## 2011-01-31 MED ORDER — INSULIN ASPART 100 UNIT/ML ~~LOC~~ SOLN
0.0000 [IU] | Freq: Three times a day (TID) | SUBCUTANEOUS | Status: DC
  Administered 2011-02-01 – 2011-02-02 (×5): 3 [IU] via SUBCUTANEOUS
  Filled 2011-01-31: qty 3

## 2011-01-31 MED ORDER — ENOXAPARIN SODIUM 60 MG/0.6ML ~~LOC~~ SOLN
60.0000 mg | Freq: Once | SUBCUTANEOUS | Status: AC
  Administered 2011-01-31: 60 mg via SUBCUTANEOUS
  Filled 2011-01-31: qty 0.6

## 2011-01-31 MED ORDER — IOHEXOL 300 MG/ML  SOLN
100.0000 mL | Freq: Once | INTRAMUSCULAR | Status: AC | PRN
  Administered 2011-01-31: 100 mL via INTRAVENOUS

## 2011-01-31 MED ORDER — ATENOLOL 50 MG PO TABS
50.0000 mg | ORAL_TABLET | Freq: Two times a day (BID) | ORAL | Status: DC
  Administered 2011-01-31 – 2011-02-02 (×4): 50 mg via ORAL
  Filled 2011-01-31 (×5): qty 1

## 2011-01-31 MED ORDER — IOHEXOL 300 MG/ML  SOLN
20.0000 mL | INTRAMUSCULAR | Status: DC | PRN
  Administered 2011-01-31: 20 mL via ORAL

## 2011-01-31 MED ORDER — ACETAMINOPHEN 650 MG RE SUPP: 650.0000 mg | Freq: Four times a day (QID) | RECTAL | Status: DC | PRN

## 2011-01-31 MED ORDER — WARFARIN SODIUM 5 MG PO TABS
5.0000 mg | ORAL_TABLET | ORAL | Status: AC
  Administered 2011-01-31: 5 mg via ORAL
  Filled 2011-01-31: qty 1

## 2011-01-31 MED ORDER — ONDANSETRON HCL 4 MG PO TABS: 4.0000 mg | ORAL_TABLET | Freq: Four times a day (QID) | ORAL | Status: DC | PRN

## 2011-01-31 MED ORDER — ONDANSETRON HCL 4 MG/2ML IJ SOLN
4.0000 mg | Freq: Once | INTRAMUSCULAR | Status: AC
  Administered 2011-01-31: 4 mg via INTRAVENOUS
  Filled 2011-01-31: qty 2

## 2011-01-31 MED ORDER — SODIUM CHLORIDE 0.9 % IV BOLUS (SEPSIS)
1000.0000 mL | Freq: Once | INTRAVENOUS | Status: AC
  Administered 2011-01-31: 1000 mL via INTRAVENOUS

## 2011-01-31 MED ORDER — VITAMIN B-12 1000 MCG PO TABS
1000.0000 ug | ORAL_TABLET | Freq: Every day | ORAL | Status: DC
  Administered 2011-02-01 – 2011-02-02 (×2): 1000 ug via ORAL
  Filled 2011-01-31 (×2): qty 1

## 2011-01-31 MED ORDER — ALUM & MAG HYDROXIDE-SIMETH 200-200-20 MG/5ML PO SUSP
30.0000 mL | Freq: Four times a day (QID) | ORAL | Status: DC | PRN
Start: 2011-01-31 — End: 2011-02-02

## 2011-01-31 MED ORDER — MORPHINE SULFATE 4 MG/ML IJ SOLN
4.0000 mg | Freq: Once | INTRAMUSCULAR | Status: AC
  Administered 2011-01-31: 4 mg via INTRAVENOUS
  Filled 2011-01-31: qty 1

## 2011-01-31 MED ORDER — ZOLPIDEM TARTRATE 5 MG PO TABS: 5.0000 mg | ORAL_TABLET | Freq: Every evening | ORAL | Status: DC | PRN

## 2011-01-31 MED ORDER — SIMVASTATIN 40 MG PO TABS
40.0000 mg | ORAL_TABLET | Freq: Every day | ORAL | Status: DC
  Administered 2011-01-31 – 2011-02-01 (×2): 40 mg via ORAL
  Filled 2011-01-31 (×3): qty 1

## 2011-01-31 MED ORDER — INSULIN ASPART 100 UNIT/ML ~~LOC~~ SOLN
0.0000 [IU] | Freq: Every day | SUBCUTANEOUS | Status: DC
  Administered 2011-01-31: 2 [IU] via SUBCUTANEOUS

## 2011-01-31 MED ORDER — ONDANSETRON HCL 4 MG/2ML IJ SOLN: 4.0000 mg | Freq: Four times a day (QID) | INTRAMUSCULAR | Status: DC | PRN

## 2011-01-31 MED ORDER — DOCUSATE SODIUM 100 MG PO CAPS
100.0000 mg | ORAL_CAPSULE | Freq: Two times a day (BID) | ORAL | Status: DC
  Administered 2011-01-31 – 2011-02-02 (×4): 100 mg via ORAL
  Filled 2011-01-31 (×5): qty 1

## 2011-01-31 MED ORDER — HYDROCODONE-ACETAMINOPHEN 5-325 MG PO TABS
1.0000 | ORAL_TABLET | ORAL | Status: DC | PRN
  Administered 2011-01-31: 1 via ORAL
  Filled 2011-01-31: qty 1

## 2011-01-31 MED ORDER — TRAMADOL HCL 50 MG PO TABS
50.0000 mg | ORAL_TABLET | Freq: Four times a day (QID) | ORAL | Status: DC | PRN
  Filled 2011-01-31: qty 1

## 2011-01-31 MED ORDER — ASPIRIN EC 81 MG PO TBEC
81.0000 mg | DELAYED_RELEASE_TABLET | Freq: Every day | ORAL | Status: DC
  Administered 2011-02-01 – 2011-02-02 (×2): 81 mg via ORAL
  Filled 2011-01-31 (×2): qty 1

## 2011-01-31 MED ORDER — ACETAMINOPHEN 325 MG PO TABS: 650.0000 mg | ORAL_TABLET | Freq: Four times a day (QID) | ORAL | Status: DC | PRN

## 2011-01-31 MED ORDER — GABAPENTIN 100 MG PO CAPS
100.0000 mg | ORAL_CAPSULE | Freq: Three times a day (TID) | ORAL | Status: DC
  Administered 2011-01-31 – 2011-02-02 (×5): 100 mg via ORAL
  Filled 2011-01-31 (×7): qty 1

## 2011-01-31 NOTE — ED Provider Notes (Signed)
  Physical Exam  BP 146/86  Pulse 98  Temp(Src) 98.4 F (36.9 C) (Oral)  Resp 16  SpO2 100%  Physical Exam  ED Course  Procedures  MDM I assumed care of this patient from Dr. Ethelda Chick at 4 PM.  Her right flank pain has resolved. She has no evidence of infection or hematuria in her urine sample. I was called by the radiologist regarding her CT results. There is abnormal perfusion to her  bilateral kidneys consistent with probable embolic disease and infarction.  CT findings discussed with Dr. early. He reviewed the patient's images and states there is no stenosis or occlusion of the renal arteries. He states he has nothing to offer this patient from a surgical standpoint.  It would be unusual for a renal infarction to have a cardiac embolic source.  Will admit to medicine for further workup and provide Lovenox bridging.    Date: 01/31/2011  Rate: 80  Rhythm: atrial fibrillation  QRS Axis: normal  Intervals: normal  ST/T Wave abnormalities: normal  Conduction Disutrbances:none  Narrative Interpretation: Stable T wave inversions inferior leads  Old EKG Reviewed: unchanged  CRITICAL CARE Performed by: Glynn Octave   Total critical care time: 30  Critical care time was exclusive of separately billable procedures and treating other patients.  Critical care was necessary to treat or prevent imminent or life-threatening deterioration.  Critical care was time spent personally by me on the following activities: development of treatment plan with patient and/or surrogate as well as nursing, discussions with consultants, evaluation of patient's response to treatment, examination of patient, obtaining history from patient or surrogate, ordering and performing treatments and interventions, ordering and review of laboratory studies, ordering and review of radiographic studies, pulse oximetry and re-evaluation of patient's condition.   Glynn Octave, MD 01/31/11 [phone]

## 2011-01-31 NOTE — H&P (Signed)
Hospital Admission Note Date: 01/31/2011  Patient name: Michelle Herrera Medical record number: [id number] Date of birth: [birth year] Age: 78 y.o. Gender: female PCP: Michele Mcalpine, MD, MD  Medical Service: [facility] Team 3  Chief Complaint: Flank pain  History of Present Illness: The patient is a 78 year old woman who presented to med center [place] complaining of severe acute onset right flank pain. She has a past medical history significant for atrial fibrillation, complicated by a small stroke/TIA for which she had full recovery and no residual deficits. She has multiple other chronic diseases include diabetes hypercholesterolemia and osteoarthritis with chronic pain. In the emergency department she had a complete workup for flank pain. Her UA was negative and does not suggest a pyelonephritis or urinary tract infection. She had a abdominal CT scan done which suggests that she may have had a renal infarct secondary to probably microemboli. Both kidneys appear to be affected her pain is predominantly on the right this pain is not associated with any other symptoms. She denies any chest pain shortness of breath or recent illness. She has no nausea vomiting or diarrhea. She denies any focal neurological deficits such as extremity weakness slurred speech or facial droop. In general before that she has been doing very well at home she has a good appetite and a high-level functional status. Was also noted in the emergency department she had a subtherapeutic INR. She reports taking her Coumadin regularly and she is followed by lobe our Coumadin clinic.   Meds: Medications Prior to Admission  Medication Dose Route Frequency Provider Last Rate Last Dose  . enoxaparin (LOVENOX) injection 60 mg  60 mg Subcutaneous Once Glynn Octave, MD   60 mg at 01/31/11 1744  . iohexol (OMNIPAQUE) 300 MG/ML solution 100 mL  100 mL Intravenous Once PRN Medication Radiologist, MD   100 mL at 01/31/11 1605  . iohexol  (OMNIPAQUE) 300 MG/ML solution 20 mL  20 mL Oral PRN Medication Radiologist, MD   20 mL at 01/31/11 1500  . morphine 4 MG/ML injection 4 mg  4 mg Intravenous Once Glynn Octave, MD   4 mg at 01/31/11 1622  . ondansetron (ZOFRAN) injection 4 mg  4 mg Intravenous Once Glynn Octave, MD   4 mg at 01/31/11 1622  . sodium chloride 0.9 % bolus 1,000 mL  1,000 mL Intravenous Once Glynn Octave, MD   1,000 mL at 01/31/11 1621   Medications Prior to Admission  Medication Sig Dispense Refill  . gabapentin (NEURONTIN) 100 MG capsule Take 1 tablet by mouth 3 (three) times daily.       . metFORMIN (GLUCOPHAGE) 500 MG tablet Take 1,000 mg by mouth 2 (two) times daily with a meal.      . simvastatin (ZOCOR) 40 MG tablet Take 40 mg by mouth at bedtime.        . traMADol (ULTRAM) 50 MG tablet Take 50 mg by mouth 3 (three) times daily as needed. For pain.      . vitamin B-12 (CYANOCOBALAMIN) 1000 MCG tablet Take 1,000 mcg by mouth daily.          Allergies: Azithromycin; Pregabalin; and Sulfonamide derivatives Past Medical History  Diagnosis Date  . Unspecified essential hypertension   . Congestive heart failure, unspecified   . Personal history of other diseases of circulatory system   . Pure hypercholesterolemia   . Type II or unspecified type diabetes mellitus without mention of complication, not stated as uncontrolled   . Anxiety state, unspecified   .  Dysuria   . Rash and other nonspecific skin eruption   . Acute cystitis   . Diaphragmatic hernia without mention of obstruction or gangrene   . Benign neoplasm of colon   . Other acquired absence of organ   . Mixed incontinence urge and stress (female)(female)   . Osteoarthrosis, unspecified whether generalized or localized, unspecified site   . Lumbago   . Pain in joint, shoulder region   . Osteoporosis, unspecified   . Memory loss    Past Surgical History  Procedure Date  . Cholecystectomy    No family history on file. History    Social History  . Marital Status: Widowed    Spouse Name: N/A    Number of Children: N/A  . Years of Education: N/A   Occupational History  . Not on file.   Social History Main Topics  . Smoking status: Never Smoker   . Smokeless tobacco: Never Used  . Alcohol Use: No  . Drug Use: No  . Sexually Active: Not on file   Other Topics Concern  . Not on file   Social History Narrative   1 sister--in poor health1 brother hx of kidney stones and heart problems    Review of Systems: Pertinent items are noted in HPI.  Physical Exam: Blood pressure 131/90, pulse 83, temperature 98.1 F (36.7 C), temperature source Oral, resp. rate 18, height 5\' 5"  (1.651 m), weight 61.3 kg (135 lb 2.3 oz), SpO2 93.00%. General appearance: pale, elderly woman in NAD, conversant  Eyes: anicteric sclerae, moist conjunctivae; no lid-lag; PERRLA HENT: Atraumatic; oropharynx clear with moist mucous membranes and no mucosal ulcerations; normal hard and soft palate Neck: Trachea midline; FROM, supple, no thyromegaly or lymphadenopathy Lungs: CTA, with normal respiratory effort and no intercostal retractions CV: RRR, no MRGs  Abdomen: Soft, mildly tender; no masses or HSM Extremities: Trace LE peripheral edema or no lymphadenopathy Skin: Normal temperature, turgor and texture; no rash, ulcers or subcutaneous nodules Psych: Appropriate affect, alert and oriented to person, place and time Neuro: No deficits in strength, motion or sensation. CN intact.    Lab results: Basic Metabolic Panel:  Basename 01/31/11 1512  NA 135  K 4.0  CL 98  CO2 27  GLUCOSE 276*  BUN 15  CREATININE 0.60  CALCIUM 9.5  MG --  PHOS --   Liver Function Tests:  Basename 01/31/11 1512  AST 17  ALT 10  ALKPHOS 83  BILITOT 0.4  PROT 6.7  ALBUMIN 3.7   No results found for this basename: LIPASE:2,AMYLASE:2 in the last 72 hours No results found for this basename: AMMONIA:2 in the last 72 hours CBC:  Basename  01/31/11 1512  WBC 7.0  NEUTROABS 5.4  HGB 12.3  HCT 36.7  MCV 93.6  PLT 236   Coagulation:  Basename 01/31/11 1515  LABPROT 21.2*  INR 1.80*    Imaging results:  Ct Abdomen Pelvis W Contrast  01/31/2011  *RADIOLOGY REPORT*  Clinical Data: Left upper quadrant pain.  CT ABDOMEN AND PELVIS WITH CONTRAST  Technique:  Multidetector CT imaging of the abdomen and pelvis was performed following the standard protocol during bolus administration of intravenous contrast.  Contrast: 20mL OMNIPAQUE IOHEXOL 300 MG/ML IV SOLN, OMNIPAQUE IOHEXOL 300 MG/ML IV SOLN  Comparison: 02/05/2009  Findings: No focal abnormalities seen in the liver or spleen. The liver does have a somewhat nodular contour raising the question of cirrhosis.  The stomach, duodenum, pancreas, and adrenal glands are unremarkable.  The gallbladder  is surgically absent.  There is abnormal perfusion to the cortex of both kidneys.  Areas of decreased perfusion on early phase imaging (see image 20 of series 2) in the left kidney persist into the delayed phase. Similar changes are seen in the right kidney with some areas of cortical thinning in the right kidney. There is some fullness of the intrarenal collecting systems bilaterally, right greater than left with evidence of right peripelvic and periureteric edema/inflammation.  No abdominal aortic aneurysm.  No free fluid or lymphadenopathy in the abdomen.  No evidence for bowel obstruction.  Imaging through the pelvis shows no free intraperitoneal fluid. Bladder is unremarkable.  Uterus is unremarkable.  There is no adnexal mass.  Diverticular changes are seen in the left colon without diverticulitis.  The terminal ileum is normal. The appendix is not visualized, but there is no edema or inflammation in the region of the cecum.  Bone windows reveal no worrisome lytic or sclerotic osseous lesions.  IMPRESSION: Segmental areas of abnormal perfusion identified in both kidneys. On the right, the  changes appear more suggestive of pyelonephritis, on the left on the early phase, there is fairly abrupt transition from perfused and perfused cortex, suggesting that these changes related to embolic disease.  Nodular hepatic contour raises a question of cirrhosis.  I personally discussed these results by telephone with Dr. Manus Gunning at 1632 hours on 01/31/2011.  Original Report Authenticated By: ERIC A. MANSELL, M.D.    Other results: EKG: Afib, rate controlled  Assessment & Plan by Problem: Patient Active Hospital Problem List:  Renal infarction (01/31/2011)    POA: Yes   Assessment: Patient likely has renal infarction based on the CT findings although not entirely clear based on imaging. Dr.Early with vascular surgery was called from the ED and he has reviewed the case in detail there are no surgical/endovascular options currently as it seems that her renal arteries are patent. This is likely from microemboli disease. Renal function is normal. She is in atrial fibrillation with subtherapeutic INR likely cardioembolic source.   Plan:  Will expand workup to include ultrasound of her bilateral kidneys. I've also ordered a 2-D echo with contrast to determine if there is a thrombus source.   DM (01/23/2007)    POA: Yes   Assessment: Controlled with oral meds at baseline.    Plan: Adult glycemic control orders placed   HYPERTENSION (01/23/2007)    POA: Yes   Assessment/Plan: I anticipate if this was truly renal infarction the she will have a dramatic increase in her blood pressure in the next 24 hours the effects of renin on the kidneys. We'll monitor this closely and add additional blood pressure meds as needed   Atrial fibrillation (03/10/2010)    POA: Yes   Assessment: Currently she has rate controlled atrial fibrillation.    Plan: Will bridge with Lovenox to get her Coumadin level therapeutic. pharmacy consult.   Subtherapeutic international normalized ratio (INR) (01/31/2011)    POA: Unknown    Assessment: INR 1.8 on admission goal would be 2-3.    Plan: Pharmacy consult for med management   Signed: Anderson Malta 01/31/2011, 9:22 PM

## 2011-01-31 NOTE — Progress Notes (Signed)
ANTICOAGULATION CONSULT NOTE - Initial Consult  Pharmacy Consult for Coumadin Indication: atrial fibrillation/renal infarct/microemboli  Allergies  Allergen Reactions  . Azithromycin     REACTION: pt states INTOL to ZPak  . Pregabalin     REACTION: pt states INTOL to Lyrica  . Sulfonamide Derivatives     REACTION: SWELLING    Patient Measurements: Height: 5\' 5"  (165.1 cm) Weight: 135 lb 2.3 oz (61.3 kg) IBW/kg (Calculated) : 57   Vital Signs: Temp: 98.1 F (36.7 C) (01/21 2025) Temp src: Oral (01/21 2025) BP: 131/90 mmHg (01/21 2025) Pulse Rate: 83  (01/21 2025)  Labs:  Basename 01/31/11 1515 01/31/11 1512  HGB -- 12.3  HCT -- 36.7  PLT -- 236  APTT -- --  LABPROT 21.2* --  INR 1.80* --  HEPARINUNFRC -- --  CREATININE -- 0.60  CKTOTAL -- --  CKMB -- --  TROPONINI -- --   Estimated Creatinine Clearance: 52.2 ml/min (by C-G formula based on Cr of 0.6).  Medical History: Past Medical History  Diagnosis Date  . Unspecified essential hypertension   . Congestive heart failure, unspecified   . Personal history of other diseases of circulatory system   . Pure hypercholesterolemia   . Type II or unspecified type diabetes mellitus without mention of complication, not stated as uncontrolled   . Anxiety state, unspecified   . Dysuria   . Rash and other nonspecific skin eruption   . Acute cystitis   . Diaphragmatic hernia without mention of obstruction or gangrene   . Benign neoplasm of colon   . Other acquired absence of organ   . Mixed incontinence urge and stress (female)(female)   . Osteoarthrosis, unspecified whether generalized or localized, unspecified site   . Lumbago   . Pain in joint, shoulder region   . Osteoporosis, unspecified   . Memory loss     Medications:  Prescriptions prior to admission  Medication Sig Dispense Refill  . atenolol (TENORMIN) 50 MG tablet Take 50 mg by mouth 2 (two) times daily.      [name] gabapentin (NEURONTIN) 100 MG capsule Take  1 tablet by mouth 3 (three) times daily.       [name] glipiZIDE (GLUCOTROL) 10 MG tablet Take 10 mg by mouth 2 (two) times daily before a meal.      . metFORMIN (GLUCOPHAGE) 500 MG tablet Take 1,000 mg by mouth 2 (two) times daily with a meal.      . simvastatin (ZOCOR) 40 MG tablet Take 40 mg by mouth at bedtime.        . traMADol (ULTRAM) 50 MG tablet Take 50 mg by mouth 3 (three) times daily as needed. For pain.      . vitamin B-12 (CYANOCOBALAMIN) 1000 MCG tablet Take 1,000 mcg by mouth daily.        [name] warfarin (COUMADIN) 5 MG tablet Take 2.5-5 mg by mouth daily. Rotates. Takes 1 tablet one day, then 1/2 the next.      [name] DISCONTD: metFORMIN (GLUCOPHAGE) 500 MG tablet Take 2 tablets (1,000 mg total) by mouth 2 (two) times daily with a meal.  120 tablet  6  . DISCONTD: traMADol (ULTRAM) 50 MG tablet Take 1 tablet (50 mg total) by mouth 3 (three) times daily as needed for pain.  90 tablet  5   Scheduled:    . aspirin EC  81 mg Oral Daily  . atenolol  50 mg Oral BID  . docusate sodium  100 mg Oral BID  .  enoxaparin  60 mg Subcutaneous Once  . gabapentin  100 mg Oral TID  . insulin aspart  0-15 Units Subcutaneous TID WC  . insulin aspart  0-5 Units Subcutaneous QHS  .  morphine injection  4 mg Intravenous Once  . ondansetron (ZOFRAN) IV  4 mg Intravenous Once  . simvastatin  40 mg Oral QHS  . sodium chloride  1,000 mL Intravenous Once  . vitamin B-12  1,000 mcg Oral Daily  . warfarin  5 mg Oral NOW    Assessment: 78yo female on Coumadin PTA for Afib, slightly subtherapeutic INR, admitted with likely renal infarct thought to be cardioembolic source, received one dose of Lovenox earlier today, to resume Coumadin.  Home regimen: 5mg  alternating with 2.5mg .  Goal of Therapy:  INR 2-3   Plan:  Will give boosted Coumadin dose of 5mg  now (had been due to take 2.5mg  today per home schedule) and adjust per INR.  Colleen Can PharmD BCPS 01/31/2011,10:53 PM

## 2011-01-31 NOTE — ED Provider Notes (Signed)
History     CSN: [id number]  Arrival date & time 01/31/11  1421   First MD Initiated Contact with Patient 01/31/11 1445      Chief Complaint  Patient presents with  . Flank Pain    (Consider location/radiation/quality/duration/timing/severity/associated sxs/prior treatment) HPI Complains of right-sided abdominal pain onset last night approximately 2 hours after eating. Had recurrence of pain this morning. Pain accompanied by nausea no fever last bowel movement last night normal no urinary symptoms. Nothing makes pain better or worse. No treatment prior to coming here. Pain was moderate to severe this morning. No other associated symptom. No chest pain no shortness of breath. Patient pain-free as I examine her Past Medical History  Diagnosis Date  . Unspecified essential hypertension   . Congestive heart failure, unspecified   . Personal history of other diseases of circulatory system   . Pure hypercholesterolemia   . Type II or unspecified type diabetes mellitus without mention of complication, not stated as uncontrolled   . Anxiety state, unspecified   . Dysuria   . Rash and other nonspecific skin eruption   . Acute cystitis   . Diaphragmatic hernia without mention of obstruction or gangrene   . Benign neoplasm of colon   . Other acquired absence of organ   . Mixed incontinence urge and stress (female)(female)   . Osteoarthrosis, unspecified whether generalized or localized, unspecified site   . Lumbago   . Pain in joint, shoulder region   . Osteoporosis, unspecified   . Memory loss     Past Surgical History  Procedure Date  . Cholecystectomy     No family history on file.  History  Substance Use Topics  . Smoking status: Never Smoker   . Smokeless tobacco: Never Used  . Alcohol Use: No    OB History    Grav Para Term Preterm Abortions TAB SAB Ect Mult Living                  Review of Systems  Constitutional: Negative.   HENT: Negative.   Respiratory:  Negative.   Cardiovascular: Negative.   Gastrointestinal: Positive for abdominal pain.  Musculoskeletal: Negative.   Skin: Negative.   Neurological: Negative.   Hematological: Negative.   Psychiatric/Behavioral: Negative.   All other systems reviewed and are negative.    Allergies  Azithromycin; Pregabalin; and Sulfonamide derivatives  Home Medications   Current Outpatient Rx  Name Route Sig Dispense Refill  . ATENOLOL 50 MG PO TABS  TAKE ONE TABLET BY MOUTH TWICE DAILY 60 tablet 11  . GLIPIZIDE 10 MG PO TABS  TAKE ONE TABLET BY MOUTH TWICE DAILY 60 tablet 5  . GLUCOSE BLOOD VI STRP  Use as directed     . METFORMIN HCL 500 MG PO TABS Oral Take 2 tablets (1,000 mg total) by mouth 2 (two) times daily with a meal. 120 tablet 6  . SIMVASTATIN 40 MG PO TABS Oral Take 40 mg by mouth at bedtime.      . TRAMADOL HCL 50 MG PO TABS Oral Take 1 tablet (50 mg total) by mouth 3 (three) times daily as needed for pain. 90 tablet 5  . VITAMIN B-12 1000 MCG PO TABS Oral Take 1,000 mcg by mouth daily.      . WARFARIN SODIUM 5 MG PO TABS  TAKE AS DIRECTED 30 tablet 3    BP 146/86  Pulse 98  Temp(Src) 98.4 F (36.9 C) (Oral)  Resp 16  SpO2 100%  Physical Exam  Nursing note and vitals reviewed. Constitutional: She appears well-developed and well-nourished.  HENT:  Head: Normocephalic and atraumatic.  Eyes: Conjunctivae are normal. Pupils are equal, round, and reactive to light.  Neck: Neck supple. No tracheal deviation present. No thyromegaly present.  Cardiovascular: Normal rate.   No murmur heard.      Irregularly irregular  Pulmonary/Chest: Effort normal and breath sounds normal.  Abdominal: Soft. Bowel sounds are normal. She exhibits no distension and no mass. There is tenderness. There is no rebound and no guarding.       Tender at right lower quadrant  Musculoskeletal: Normal range of motion. She exhibits no edema and no tenderness.  Neurological: She is alert. Coordination normal.   Skin: Skin is warm and dry. No rash noted.  Psychiatric: She has a normal mood and affect.   Results for orders placed during the hospital encounter of 01/31/11  URINALYSIS, ROUTINE W REFLEX MICROSCOPIC      Component Value Range   Color, Urine YELLOW  YELLOW    APPearance CLEAR  CLEAR    Specific Gravity, Urine 1.024  1.005 - 1.030    pH 5.5  5.0 - 8.0    Glucose, UA >1000 (*) NEGATIVE (mg/dL)   Hgb urine dipstick NEGATIVE  NEGATIVE    Bilirubin Urine NEGATIVE  NEGATIVE    Ketones, ur NEGATIVE  NEGATIVE (mg/dL)   Protein, ur NEGATIVE  NEGATIVE (mg/dL)   Urobilinogen, UA 0.2  0.0 - 1.0 (mg/dL)   Nitrite NEGATIVE  NEGATIVE    Leukocytes, UA SMALL (*) NEGATIVE   LACTIC ACID, PLASMA      Component Value Range   Lactic Acid, Venous 3.0 (*) 0.5 - 2.2 (mmol/L)  COMPREHENSIVE METABOLIC PANEL      Component Value Range   Sodium 135  135 - 145 (mEq/L)   Potassium 4.0  3.5 - 5.1 (mEq/L)   Chloride 98  96 - 112 (mEq/L)   CO2 27  19 - 32 (mEq/L)   Glucose, Bld 276 (*) 70 - 99 (mg/dL)   BUN 15  6 - 23 (mg/dL)   Creatinine, Ser 1.61  0.50 - 1.10 (mg/dL)   Calcium 9.5  8.4 - 09.6 (mg/dL)   Total Protein 6.7  6.0 - 8.3 (g/dL)   Albumin 3.7  3.5 - 5.2 (g/dL)   AST 17  0 - 37 (U/L)   ALT 10  0 - 35 (U/L)   Alkaline Phosphatase 83  39 - 117 (U/L)   Total Bilirubin 0.4  0.3 - 1.2 (mg/dL)   GFR calc non Af Amer 85 (*) >90 (mL/min)   GFR calc Af Amer >90  >90 (mL/min)  CBC      Component Value Range   WBC 7.0  4.0 - 10.5 (K/uL)   RBC 3.92  3.87 - 5.11 (MIL/uL)   Hemoglobin 12.3  12.0 - 15.0 (g/dL)   HCT 04.5  40.9 - 81.1 (%)   MCV 93.6  78.0 - 100.0 (fL)   MCH 31.4  26.0 - 34.0 (pg)   MCHC 33.5  30.0 - 36.0 (g/dL)   RDW 91.4  78.2 - 95.6 (%)   Platelets 236  150 - 400 (K/uL)  DIFFERENTIAL      Component Value Range   Neutrophils Relative 77  43 - 77 (%)   Neutro Abs 5.4  1.7 - 7.7 (K/uL)   Lymphocytes Relative 13  12 - 46 (%)   Lymphs Abs 0.9  0.7 - 4.0 (K/uL)  Monocytes Relative  8  3 - 12 (%)   Monocytes Absolute 0.6  0.1 - 1.0 (K/uL)   Eosinophils Relative 1  0 - 5 (%)   Eosinophils Absolute 0.1  0.0 - 0.7 (K/uL)   Basophils Relative 1  0 - 1 (%)   Basophils Absolute 0.1  0.0 - 0.1 (K/uL)  URINE MICROSCOPIC-ADD ON      Component Value Range   Squamous Epithelial / LPF RARE  RARE    WBC, UA 3-6  <3 (WBC/hpf)   Bacteria, UA RARE  RARE    No results found.   ED Course  Procedures (including critical care time) Patient signed out to Dr. Manus Gunning 350 pm  Labs Reviewed  URINALYSIS, ROUTINE W REFLEX MICROSCOPIC   No results found.   No diagnosis found.    MDM  Dr. Manus Gunning to check on CT scan, lab work and re-evaluate patient Diagnosis #1 abdominal pain #2 hyperglycemia        Doug Sou, MD 01/31/11 [phone]

## 2011-01-31 NOTE — ED Notes (Addendum)
Pt c/o right RLQ pain since last night radiating to flank and right low back pain. Pt denies urinary symptoms. Pt sts she took an enema about 3 hrs ago but "it didn't help".

## 2011-02-01 ENCOUNTER — Inpatient Hospital Stay (HOSPITAL_COMMUNITY): Payer: Medicare Other

## 2011-02-01 DIAGNOSIS — I369 Nonrheumatic tricuspid valve disorder, unspecified: Secondary | ICD-10-CM

## 2011-02-01 LAB — BASIC METABOLIC PANEL
BUN: 10 mg/dL (ref 6–23)
Chloride: 99 mEq/L (ref 96–112)
GFR calc Af Amer: >90 mL/min (ref >90)
Glucose, Bld: 161 mg/dL — ABNORMAL HIGH (ref 70–99)
Potassium: 3.9 mEq/L (ref 3.5–5.1)

## 2011-02-01 LAB — PROTIME-INR: INR: 2.03 — ABNORMAL HIGH (ref 0.00–1.49)

## 2011-02-01 LAB — CBC
HCT: 32.9 % — ABNORMAL LOW (ref 36.0–46.0)
Hemoglobin: 11.2 g/dL — ABNORMAL LOW (ref 12.0–15.0)
MCHC: 34 g/dL (ref 30.0–36.0)
RBC: 3.56 MIL/uL — ABNORMAL LOW (ref 3.87–5.11)

## 2011-02-01 LAB — GLUCOSE, CAPILLARY: Glucose-Capillary: 168 mg/dL — ABNORMAL HIGH (ref 70–99)

## 2011-02-01 MED ORDER — WARFARIN SODIUM 5 MG PO TABS
5.0000 mg | ORAL_TABLET | Freq: Once | ORAL | Status: AC
  Administered 2011-02-01: 5 mg via ORAL
  Filled 2011-02-01: qty 1

## 2011-02-01 NOTE — Progress Notes (Signed)
  Echocardiogram 2D Echocardiogram has been performed.  Mercy Moore 02/01/2011, 12:10 PM

## 2011-02-01 NOTE — Progress Notes (Signed)
Inpatient Diabetes Program Recommendations  AACE/ADA: New Consensus Statement on Inpatient Glycemic Control (2009)  Target Ranges:  Prepandial:   less than 140 mg/dL      Peak postprandial:   less than 180 mg/dL (1-2 hours)      Critically ill patients:  140 - 180 mg/dL   Reason for Visit: History of Diabetes     Inpatient Diabetes Program Recommendations Diet: Add CHO modified medium to diet    

## 2011-02-01 NOTE — Progress Notes (Signed)
ANTICOAGULATION CONSULT NOTE - Follow Up Consult  Pharmacy Consult for Coumadin  Indication: atrial fibrillation/renal infarct/microemboli   Allergies  Allergen Reactions  . Azithromycin     REACTION: pt states INTOL to ZPak  . Pregabalin     REACTION: pt states INTOL to Lyrica  . Sulfonamide Derivatives     REACTION: SWELLING    Patient Measurements: Height: 5\' 5"  (165.1 cm) Weight: 135 lb 2.3 oz (61.3 kg) IBW/kg (Calculated) : 57   Vital Signs: Temp: 97.7 F (36.5 C) (01/22 0523) Temp src: Oral (01/22 0523) BP: 115/76 mmHg (01/22 0702) Pulse Rate: 79  (01/22 0702)  Labs:  Basename 02/01/11 0543 01/31/11 1515 01/31/11 1512  HGB 11.2* -- 12.3  HCT 32.9* -- 36.7  PLT 212 -- 236  APTT -- -- --  LABPROT 23.3* 21.2* --  INR 2.03* 1.80* --  HEPARINUNFRC -- -- --  CREATININE 0.58 -- 0.60  CKTOTAL -- -- --  CKMB -- -- --  TROPONINI -- -- --   Estimated Creatinine Clearance: 52.2 ml/min (by C-G formula based on Cr of 0.58).   Medications:  Scheduled:    . aspirin EC  81 mg Oral Daily  . atenolol  50 mg Oral BID  . docusate sodium  100 mg Oral BID  . enoxaparin  60 mg Subcutaneous Once  . gabapentin  100 mg Oral TID  . insulin aspart  0-15 Units Subcutaneous TID WC  . insulin aspart  0-5 Units Subcutaneous QHS  .  morphine injection  4 mg Intravenous Once  . ondansetron (ZOFRAN) IV  4 mg Intravenous Once  . simvastatin  40 mg Oral QHS  . sodium chloride  1,000 mL Intravenous Once  . vitamin B-12  1,000 mcg Oral Daily  . warfarin  5 mg Oral NOW    Assessment: 78yo female on Coumadin PTA for Afib, slightly subtherapeutic INR, admitted with likely renal infarct thought to be cardioembolic source. INR is 2.03 and up from 01/31/11. Home regimen: 5mg  alternating with 2.5mg .  Goal of Therapy:  INR 2-3   Plan:  -Coumadin 5mg  po today.  Benny Lennert 02/01/2011,10:46 AM

## 2011-02-01 NOTE — Progress Notes (Signed)
Patient ID: Michelle Herrera, female   DOB: September 19, 1930, 78 y.o.   MRN: [id number]  PCP:  Dr. Alroy Dust  The patient is a 78 year old woman who presented to med center [facility] complaining of severe acute onset right flank pain. She has a past medical history significant for atrial fibrillation, complicated by a small stroke/TIA for which she had full recovery and no residual deficits. She has multiple other chronic diseases include diabetes hypercholesterolemia and osteoarthritis with chronic pain. In the emergency department she had a complete workup for flank pain.  Patient likely has renal infarction based on the CT findings although not entirely clear based on imaging. Dr.Early with vascular surgery was called from the ED and he has reviewed the case in detail there are no surgical/endovascular options currently as it seems that her renal arteries are patent. This is likely from microemboli disease. Renal function is normal. She is in atrial fibrillation with subtherapeutic INR likely cardioembolic source.   Subjective: I'm no longer having any pain.  I feel fine.  I used to see Dr. Aldean Ast, but he retired and I have another urologist.  Objective: Weight change:   Intake/Output Summary (Last 24 hours) at 02/01/11 1058 Last data filed at 02/01/11 1015  Gross per 24 hour  Intake    940 ml  Output      3 ml  Net    937 ml   Blood pressure 115/76, pulse 79, temperature 97.7 F (36.5 C), temperature source Oral, resp. rate 18, height 5\' 5"  (1.651 m), weight 61.3 kg (135 lb 2.3 oz), SpO2 95.00%. Filed Vitals:   01/31/11 1745 01/31/11 2025 02/01/11 0523 02/01/11 0702  BP: 142/92 131/90 112/71 115/76  Pulse: 82 83 70 79  Temp:  98.1 F (36.7 C) 97.7 F (36.5 C)   TempSrc:  Oral Oral   Resp:  18 20 18   Height:  5\' 5"  (1.651 m)    Weight:  61.3 kg (135 lb 2.3 oz)    SpO2: 95% 93% 95%     Physical Exam: General: No acute distress Lungs: Clear to auscultation bilaterally without  wheezes or crackles Cardiovascular: Regular rate and rhythm without murmur gallop or rub normal S1 and S2 Abdomen: Nontender, nondistended, soft, bowel sounds positive, no rebound, no ascites, no appreciable mass Extremities: No significant cyanosis, clubbing, or edema bilateral lower extremities  Basic Metabolic Panel:  Lab 02/01/11 1308 01/31/11 1512  NA 134* 135  K 3.9 4.0  CL 99 98  CO2 26 27  GLUCOSE 161* 276*  BUN 10 15  CREATININE 0.58 0.60  CALCIUM 8.6 9.5  MG -- --  PHOS -- --   Liver Function Tests:  Lab 01/31/11 1512  AST 17  ALT 10  ALKPHOS 83  BILITOT 0.4  PROT 6.7  ALBUMIN 3.7   CBC:  Lab 02/01/11 0543 01/31/11 1512  WBC 5.5 7.0  NEUTROABS -- 5.4  HGB 11.2* 12.3  HCT 32.9* 36.7  MCV 92.4 93.6  PLT 212 236   CBG:  Lab 02/01/11 0749 01/31/11 2211  GLUCAP 156* 209*   Hemoglobin A1C:  Lab 01/31/11 2237  HGBA1C 7.4*   Coagulation:  Lab 02/01/11 0543 01/31/11 1515  LABPROT 23.3* 21.2*  INR 2.03* 1.80*   Urinalysis: No infection.  Studies/Results: CT Abd/Pelvis 1/21.   Segmental areas of abnormal perfusion identified in both kidneys. On the right, the changes appear more suggestive of pyelonephritis, on the left on the early phase, there is fairly abrupt transition from perfused  and perfused cortex, suggesting that these changes related to embolic disease.  Nodular hepatic contour raises a question of cirrhosis.  Scheduled Meds:   . aspirin EC  81 mg Oral Daily  . atenolol  50 mg Oral BID  . docusate sodium  100 mg Oral BID  . enoxaparin  60 mg Subcutaneous Once  . gabapentin  100 mg Oral TID  . insulin aspart  0-15 Units Subcutaneous TID WC  . insulin aspart  0-5 Units Subcutaneous QHS  .  morphine injection  4 mg Intravenous Once  . ondansetron (ZOFRAN) IV  4 mg Intravenous Once  . simvastatin  40 mg Oral QHS  . sodium chloride  1,000 mL Intravenous Once  . vitamin B-12  1,000 mcg Oral Daily  . warfarin  5 mg Oral NOW  . warfarin   5 mg Oral ONCE-1800   Continuous Infusions:   . sodium chloride 75 mL/hr at 01/31/11 2236   PRN Meds:.acetaminophen, acetaminophen, alum & mag hydroxide-simeth, HYDROcodone-acetaminophen, iohexol, ondansetron (ZOFRAN) IV, ondansetron, traMADol, zolpidem, DISCONTD: iohexol  Anti-infectives:  Anti-infectives    None      Assessment/Plan: Principal Problem:  *Renal infarction Active Problems:  DM  HYPERTENSION  Atrial fibrillation  Abdominal pain  Subtherapeutic international normalized ratio (INR)   1.  Renal Infarction:  Ultrasound of bilateral kidneys pending.  Patient denies pain and has no difficulty urinating.  She will need close follow up with Urology at D/C    2.  Atrial Fib:  Coumadin was subtheraputic (1.8) now 2.03.  2D Echo pending for source of emboli.  3.  DM:  Not well controlled outpatient.  On sliding scale in house.  4.  Subtheraputic INR:  Resolved with dosing per pharmacy.  5.  Dispo to home today or tomorrow pending results of 2d echo and renal u/s.   LOS: 1 day   Stephani Police 02/01/2011, 10:58 AM [phone]

## 2011-02-01 NOTE — Progress Notes (Signed)
Chart reviewed. Patient interviewed and examined. Discussed with Ms. Michelle Herrera and agree with her notes.  Patient indicates that she was well until Sunday night when she ate a fruit cup which was followed by initially diffuse abdominal pain with some nausea but no vomiting or diarrhea. She has not had a BM for 2 days. Subsequently she started having pain mostly localized in the right flank. This was not associated with dysuria or urinary frequency. Since being admitted to the hospital, her pain had almost resolved until she had the renal ultrasound and now she complains of mild pain in the right flank. This is however much better than on admission. There is no hematuria.  Abdominal pain: Possibly secondary to renal infarction from embolic disease from her A. fib in the context of mildly subtherapeutic INR. INR is now therapeutic. No surgical intervention per discussion with vascular surgery. No clinical features or lab data supporting urinary tract infection. Await renal ultrasound results.  Poorly controlled type 2 diabetes mellitus: We'll need tighter outpatient control.  Atrial fibrillation: With controlled ventricular rate and currently therapeutically anticoagulated. Discontinue Lovenox.  Cardiomyopathy with left ventricle ejection fraction of 40-45 with diffuse hypokinesis of left ventricle. These findings are not new compared to echo from 2011. Outpatient followup with her primary cardiologist.  Hypertension: Controlled  Disposition: Possible discharge home tomorrow.   Travonte Byard 2:53 PM

## 2011-02-02 LAB — BASIC METABOLIC PANEL
CO2: 26 mEq/L (ref 19–32)
Chloride: 103 mEq/L (ref 96–112)
Creatinine, Ser: 0.53 mg/dL (ref 0.50–1.10)
GFR calc Af Amer: >90 mL/min (ref >90)
Potassium: 4.1 mEq/L (ref 3.5–5.1)

## 2011-02-02 LAB — CBC
HCT: 35.1 % — ABNORMAL LOW (ref 36.0–46.0)
MCV: 94.1 fL (ref 78.0–100.0)
Platelets: 215 10*3/uL (ref 150–400)
RBC: 3.73 MIL/uL — ABNORMAL LOW (ref 3.87–5.11)
RDW: 13.6 % (ref 11.5–15.5)
WBC: 6.1 10*3/uL (ref 4.0–10.5)

## 2011-02-02 LAB — GLUCOSE, CAPILLARY
Glucose-Capillary: 157 mg/dL — ABNORMAL HIGH (ref 70–99)
Glucose-Capillary: 182 mg/dL — ABNORMAL HIGH (ref 70–99)

## 2011-02-02 LAB — PROTIME-INR: INR: 2.44 — ABNORMAL HIGH (ref 0.00–1.49)

## 2011-02-02 MED ORDER — ASPIRIN 81 MG PO TBEC: 81.0000 mg | DELAYED_RELEASE_TABLET | Freq: Every day | ORAL | Status: AC

## 2011-02-02 MED ORDER — WARFARIN SODIUM 2.5 MG PO TABS
2.5000 mg | ORAL_TABLET | Freq: Once | ORAL | Status: DC
  Filled 2011-02-02: qty 1

## 2011-02-02 NOTE — Progress Notes (Signed)
Michelle Herrera to be D/C'd Home per MD order.  Discussed with the patient and all questions fully answered.   Michelle Herrera, Michelle Herrera  Home Medication Instructions [id number]   Printed on:02/02/11 1554  Medication Information                    simvastatin (ZOCOR) 40 MG tablet Take 40 mg by mouth at bedtime.             vitamin B-12 (CYANOCOBALAMIN) 1000 MCG tablet Take 1,000 mcg by mouth daily.             gabapentin (NEURONTIN) 100 MG capsule Take 1 tablet by mouth 3 (three) times daily.            atenolol (TENORMIN) 50 MG tablet Take 50 mg by mouth 2 (two) times daily.           glipiZIDE (GLUCOTROL) 10 MG tablet Take 10 mg by mouth 2 (two) times daily before a meal.           warfarin (COUMADIN) 5 MG tablet Take 2.5-5 mg by mouth daily. Rotates. Takes 1 tablet one day, then 1/2 the next.           traMADol (ULTRAM) 50 MG tablet Take 50 mg by mouth 3 (three) times daily as needed. For pain.           metFORMIN (GLUCOPHAGE) 500 MG tablet Take 1,000 mg by mouth 2 (two) times daily with a meal.           aspirin EC 81 MG EC tablet Take 1 tablet (81 mg total) by mouth daily.             VVS, Skin clean, dry and intact without evidence of skin break down, no evidence of skin tears noted. IV catheter discontinued intact. Site without signs and symptoms of complications. Dressing and pressure applied.  An After Visit Summary was printed and given to the patient. Patient escorted via WC, and D/C home via private auto.  Driggers, Rae Roam 02/02/2011 3:54 PM

## 2011-02-02 NOTE — Progress Notes (Signed)
ANTICOAGULATION CONSULT NOTE - Follow Up Consult  Pharmacy Consult for Coumadin  Indication: atrial fibrillation/renal infarct/microemboli   Allergies  Allergen Reactions  . Azithromycin     REACTION: pt states INTOL to ZPak  . Pregabalin     REACTION: pt states INTOL to Lyrica  . Sulfonamide Derivatives     REACTION: SWELLING    Patient Measurements: Height: 5\' 5"  (165.1 cm) Weight: 135 lb 2.3 oz (61.3 kg) IBW/kg (Calculated) : 57   Vital Signs: Temp: 98 F (36.7 C) (01/23 0530) Temp src: Oral (01/22 2141) BP: 154/81 mmHg (01/23 0530) Pulse Rate: 96  (01/23 0530)  Labs:  Basename 02/02/11 0624 02/01/11 0543 01/31/11 1515 01/31/11 1512  HGB 11.9* 11.2* -- --  HCT 35.1* 32.9* -- 36.7  PLT 215 212 -- 236  APTT -- -- -- --  LABPROT 26.9* 23.3* 21.2* --  INR 2.44* 2.03* 1.80* --  HEPARINUNFRC -- -- -- --  CREATININE 0.53 0.58 -- 0.60  CKTOTAL -- -- -- --  CKMB -- -- -- --  TROPONINI -- -- -- --   Estimated Creatinine Clearance: 52.2 ml/min (by C-G formula based on Cr of 0.53).   Medications:  Scheduled:     . aspirin EC  81 mg Oral Daily  . atenolol  50 mg Oral BID  . docusate sodium  100 mg Oral BID  . gabapentin  100 mg Oral TID  . insulin aspart  0-15 Units Subcutaneous TID WC  . insulin aspart  0-5 Units Subcutaneous QHS  . simvastatin  40 mg Oral QHS  . vitamin B-12  1,000 mcg Oral Daily  . warfarin  5 mg Oral ONCE-1800    Assessment: 78yo female on Coumadin PTA for Afib, slightly subtherapeutic INR, admitted with likely renal infarct thought to be cardioembolic source. INR is 2.44 and up from 01/31/11. Home regimen: 5mg  alternating with 2.5mg .  Goal of Therapy:  INR 2-3   Plan:  -Coumadin 2.5mg  po today.  Benny Lennert 02/02/2011,9:33 AM

## 2011-02-02 NOTE — Progress Notes (Signed)
Utilization Review Completed.Michelle Herrera T12/03/2011   

## 2011-02-02 NOTE — Discharge Summary (Signed)
Patient interviewed and examined. Patient indicates that her abdominal pain has resolved. She has had history of intermittent left side pain for a long time which she attributes to "gas". She has used muscle  relaxants in the past. She's not had a BM since admission but is passing flatus. Telemetry shows atrial fibrillation with controlled ventricular rate in the 80s. 2.06 second pause has been recorded on the telemetry records but unable to find this on the monitor at this time. Patient has a cardiomyopathy on repeat echo which does not seem to be any different than prior echo in 2011. Patient is advised to followup with her primary cardiologist regarding further workup or evaluation of the same. Patient lives with her son and is fairly independent although she uses a cane at times. She has been ambulating in the room steadily.    Patient is discharged home in stable condition.   Breeona Waid 1:58 PM

## 2011-02-02 NOTE — Discharge Summary (Signed)
Patient ID: Michelle Herrera MRN: [id number] DOB/AGE: 1930-07-16 78 y.o.  Admit date: 01/31/2011 Discharge date: 02/02/2011  Primary Care Physician:  Michelle Mcalpine, MD, MD Discharge Diagnoses:   Present on Admission:  Principal Problem:  *Bilateral Renal infarction  Active Problems: Cardiomyopathy with an EF of 45%  DM  Hypertension  Atrial fibrillation on chronic coumadin  Abdominal pain  Subtherapeutic international normalized ratio (INR)   Medication List  As of 02/02/2011  1:26 PM   TAKE these medications         aspirin 81 MG EC tablet   Take 1 tablet (81 mg total) by mouth daily.      atenolol 50 MG tablet   Commonly known as: TENORMIN   Take 50 mg by mouth 2 (two) times daily.      gabapentin 100 MG capsule   Commonly known as: NEURONTIN   Take 1 tablet by mouth 3 (three) times daily.      glipiZIDE 10 MG tablet   Commonly known as: GLUCOTROL   Take 10 mg by mouth 2 (two) times daily before a meal.      metFORMIN 500 MG tablet   Commonly known as: GLUCOPHAGE   Take 1,000 mg by mouth 2 (two) times daily with a meal.      simvastatin 40 MG tablet   Commonly known as: ZOCOR   Take 40 mg by mouth at bedtime.      traMADol 50 MG tablet   Commonly known as: ULTRAM   Take 50 mg by mouth 3 (three) times daily as needed. For pain.      vitamin B-12 1000 MCG tablet   Commonly known as: CYANOCOBALAMIN   Take 1,000 mcg by mouth daily.      warfarin 5 MG tablet   Commonly known as: COUMADIN   Take 2.5-5 mg by mouth daily. Rotates. Takes 1 tablet one day, then 1/2 the next.            Brief H and P: From the admission note:  The patient is a 78 year old woman who presented to [facility] complaining of severe acute onset right flank pain. She has a past medical history significant for atrial fibrillation, complicated by a small stroke/TIA for which she had full recovery and no residual deficits. She has multiple other chronic diseases include diabetes  hypercholesterolemia and osteoarthritis with chronic pain. In the emergency department she had a complete Herrera for flank pain. Her UA was negative and does not suggest a pyelonephritis or urinary tract infection. She had a abdominal CT scan done which suggests that she may have had a renal infarct secondary to probably microemboli. Both kidneys appear to be affected her pain is predominantly on the right this pain is not associated with any other symptoms. She denies any chest pain shortness of breath or recent illness. She has no nausea vomiting or diarrhea. She denies any focal neurological deficits such as extremity weakness slurred speech or facial droop. In general before that she has been doing very well at home she has a good appetite and a high-level functional status. Was also noted in the emergency department she had a subtherapeutic INR. She reports taking her Coumadin regularly and she is followed by [facility] our Coumadin clinic.  Assessment at the time of admission:    Patient likely has renal infarction based on the CT findings although not entirely clear based on imaging. Dr.Early with vascular surgery was called from the ED and he has  reviewed the case in detail there are no surgical/endovascular options currently as it seems that her renal arteries are patent. This is likely from microemboli disease. Renal function is normal. She is in atrial fibrillation with subtherapeutic INR likely cardioembolic source.   Plan: Will expand Herrera to include ultrasound of her bilateral kidneys. I've also ordered a 2-D echo with contrast to determine if there is a thrombus source.   Michelle Herrera pain improved overnight.  Her Coumadin was managed by pharmacy and is now therapeutic at 2.44 today.  As the CT done in the emergency department raised the question of bilateral renal infarcts, a followup renal ultrasound was done as well.  Results are detailed below but were consistent with the CT results in that  they represented pyelonephritis versus infarcts. The patient had no increase in there her white blood cell count or temperature, and she did not have evidence of infection in her urine consequently pyelonephritis was ruled out. Evaluation with a 2-D echocardiogram was performed in the search for an embolic source. No emboli were found on 2-D echo however results demonstrated mild LVH, an EF of 45% with hypokinesis, valvular abnormalities, and dilated atria. In reviewing her previous 2-D echo it did not appear to be substantially different consequently we did not call cardiology consult but rather scheduled a followup appointment with her cardiologist as an outpatient.  With regards to her diabetes, Michelle Herrera glucose control over the last 3 months has been less than adequate.  Her hemoglobin A1c is 7.4. We will discharge her on her previous medications including glipizide and metformin and we will request that she see her primary care physician for improved diabetic control.  Today Michelle Herrera is complete. She complains of mild pain in the left lower quadrant of her back that improves with movement.  The pain she was admitted for is now resolved. She is in stable condition and ready for discharge to home.  A followup Coumadin clinic appointment has been scheduled, again, her INR is 2.44 today. Followup with Dr. Alroy Dust and Dr. Sharrell Ku have been scheduled as well.   Physical Exam on Discharge: General: Alert, awake, oriented x3, in no acute distress. HEENT: No bruits, no goiter. Heart: Irregular rate and rhythm, without murmurs, rubs, gallops. Lungs: Clear to auscultation bilaterally. Abdomen: Soft, nontender, nondistended, positive bowel sounds, no CVA tenderness Extremities: No clubbing cyanosis or edema with positive pedal pulses. Neuro: Grossly intact, nonfocal.  Filed Vitals:   02/01/11 0702 02/01/11 1355 02/01/11 2141 02/02/11 0530  BP: 115/76 134/74 126/82 154/81    Pulse: 79 78 77 96  Temp:  98.4 F (36.9 C) 98.1 F (36.7 C) 98 F (36.7 C)  TempSrc:  Oral Oral   Resp: 18 18 18 18   Height:      Weight:      SpO2:  97% 96% 96%     Intake/Output Summary (Last 24 hours) at 02/02/11 1326 Last data filed at 02/02/11 0900  Gross per 24 hour  Intake    958 ml  Output      2 ml  Net    956 ml    Basic Metabolic Panel:  Lab 02/02/11 2841 02/01/11 0543  NA 140 134*  K 4.1 3.9  CL 103 99  CO2 26 26  GLUCOSE 177* 161*  BUN 9 10  CREATININE 0.53 0.58  CALCIUM 9.4 8.6  MG -- --  PHOS -- --   Liver Function Tests:  Lab 01/31/11 1512  AST 17  ALT 10  ALKPHOS 83  BILITOT 0.4  PROT 6.7  ALBUMIN 3.7   CBC:  Lab 02/02/11 0624 02/01/11 0543 01/31/11 1512  WBC 6.1 5.5 --  NEUTROABS -- -- 5.4  HGB 11.9* 11.2* --  HCT 35.1* 32.9* --  MCV 94.1 92.4 --  PLT 215 212 --   CBG:  Lab 02/02/11 1120 02/02/11 0744 02/01/11 2140 02/01/11 1656 02/01/11 1159 02/01/11 0749  GLUCAP 182* 157* 175* 168* 153* 156*   Hemoglobin A1C:  Lab 01/31/11 2237  HGBA1C 7.4*   Coagulation:  Lab 02/02/11 0624 02/01/11 0543 01/31/11 1515  LABPROT 26.9* 23.3* 21.2*  INR 2.44* 2.03* 1.80*   Urinalysis: Results for [facility] (MRN [id number]) as of 02/02/2011 13:38  Ref. Range 01/31/2011 14:41  Color, Urine Latest Range: YELLOW  YELLOW  APPearance Latest Range: CLEAR  CLEAR  Specific Gravity, Urine Latest Range: 1.005-1.030  1.024  pH Latest Range: 5.0-8.0  5.5  Glucose, UA Latest Range: NEGATIVE mg/dL >9629 (A)  Bilirubin Urine Latest Range: NEGATIVE  NEGATIVE  Ketones, ur Latest Range: NEGATIVE mg/dL NEGATIVE  Protein Latest Range: NEGATIVE mg/dL NEGATIVE  Urobilinogen, UA Latest Range: 0.0-1.0 mg/dL 0.2  Nitrite Latest Range: NEGATIVE  NEGATIVE  Leukocytes, UA Latest Range: NEGATIVE  SMALL (A)  WBC, UA Latest Range: <3 WBC/hpf 3-6  Squamous Epithelial / LPF Latest Range: RARE  RARE  Bacteria, UA Latest Range: RARE  RARE   Significant  Diagnostic Studies:   2D Echo Cardiogram:  Study Conclusions  - Left ventricle: Hypokinesis of the septum. The cavity size was normal. Wall thickness was increased in a pattern of mild LVH. Systolic function was mildly to moderately reduced. The estimated ejection fraction was in the range of 40% to 45%. Diffuse hypokinesis. Findings consistent with left ventricular diastolic dysfunction. - Mitral valve: Mild to moderate regurgitation. - Left atrium: The atrium was moderately to severely dilated. - Right ventricle: The cavity size was moderately to severely dilated. Systolic function was moderately to severely reduced. - Right atrium: The atrium was moderately to severely dilated. - Tricuspid valve: Moderate regurgitation. - Pulmonary arteries: PA peak pressure: 45mm Hg (S).   X-ray Chest Pa And Lateral   02/01/2011  *RADIOLOGY REPORT*  Clinical Data: , pain.  CHEST - 2 VIEW  Comparison: None.  Findings: There is marked cardiomegaly.  Pulmonary vascular congestion without frank edema is noted.  No consolidative process, pneumothorax or effusion.  IMPRESSION: Marked cardiomegaly.  No acute finding.   Ct Abdomen Pelvis W Contrast  01/31/2011  *RADIOLOGY REPORT*   IMPRESSION: Segmental areas of abnormal perfusion identified in both kidneys. On the right, the changes appear more suggestive of pyelonephritis, on the left on the early phase, there is fairly abrupt transition from perfused and perfused cortex, suggesting that these changes related to embolic disease.  Nodular hepatic contour raises a question of cirrhosis.    US Renal  02/01/2011  *RADIOLOGY REPORT*  Clinical Data: Right flank pain.  Question of renal infarct. History of hypertension, diabetes.  RENAL/URINARY TRACT ULTRASOUND COMPLETE  Comparison:  CT 01/31/2011  F IMPRESSION:  1.  Small amount of perinephric fluid on the right. 2.  No hydronephrosis. 3.  No focal mass identified.  The lack of focal mass lesions is consistent with  the CT findings of focal perfusion abnormalities, consistent with pyelonephritis verses infarcts.     Disposition and Follow-up:   Discharge Orders    Future Appointments: Provider: Department: Dept Phone: Center:  02/07/2011 9:00 AM Raul Del, RN [facility] [phone] None   02/09/2011 12:00 PM Beatrice Lecher, PA [facility] [phone] [facility]   02/22/2011 11:30 AM Michelle Mcalpine, MD [facility] [phone] None     Future Orders Please Complete By Expires   Diet - low sodium heart healthy      Increase activity slowly      Discharge instructions      Scheduling Instructions:   After Seen by Dr. Waymon Amato     Follow-up Information    Follow up with Lewayne Bunting, MD on 02/09/2011. (Please see Tereso Newcomer, PA at 12:00 Herrera)    Contact information:   [address] [place] [address] [phone]       Follow up with Michelle Mcalpine, MD on 02/22/2011. (See Dr. Kriste Basque at 11:30 am)    Contact information:   [facility] [employer] [address] [place] [address] [place] [address] [phone]       Follow up on 02/07/2011. (Coumadin Clinic on [address] - Blood draw at 9:00 am)           Time spent on Discharge: 40 min.  SignedStephani Police 02/02/2011, 1:26 PM [phone]

## 2011-02-04 ENCOUNTER — Telehealth: Payer: Self-pay | Admitting: Pulmonary Disease

## 2011-02-04 NOTE — Telephone Encounter (Signed)
I spoke with pt and advised her of SN recs. She voiced her understanding and wrote all the directions down. Pt had no questions and needed nothing further

## 2011-02-04 NOTE — Telephone Encounter (Signed)
I spoke with Michelle Herrera and she states she went to the ED on 01/31/11 and was advised she had a blood clot in her kidney. Michelle Herrera was released on wed. Michelle Herrera states she has not had a bowel movement since Sunday. Michelle Herrera states her side is hurting and having a lot gas. I advised Michelle Herrera she could take an OTC medication to help with the constipation but asked I get this to SN for his recs. Please advise Dr. Kriste Basque, thanks  Allergies  Allergen Reactions  . Azithromycin     REACTION: Michelle Herrera states INTOL to ZPak  . Pregabalin     REACTION: Michelle Herrera states INTOL to Lyrica  . Sulfonamide Derivatives     REACTION: SWELLING

## 2011-02-04 NOTE — Telephone Encounter (Signed)
Per SN--  Right now use 1 bottle of mag citrate followed by water, then if no results,   4 dulcolax tablets at bedtime, then if no results use dulocolax supp in the morning.   Use miralax 1 capful in water daily and senokot s 2 at bedtime.  thanks

## 2011-02-05 ENCOUNTER — Emergency Department (HOSPITAL_BASED_OUTPATIENT_CLINIC_OR_DEPARTMENT_OTHER)
Admission: EM | Admit: 2011-02-05 | Discharge: 2011-02-05 | Disposition: A | Discharge status: Home / Self Care | Payer: Medicare Other | Attending: Emergency Medicine | Admitting: Emergency Medicine

## 2011-02-05 ENCOUNTER — Encounter (HOSPITAL_BASED_OUTPATIENT_CLINIC_OR_DEPARTMENT_OTHER): Payer: Self-pay | Admitting: Emergency Medicine

## 2011-02-05 DIAGNOSIS — E119 Type 2 diabetes mellitus without complications: Secondary | ICD-10-CM | POA: Insufficient documentation

## 2011-02-05 DIAGNOSIS — I10 Essential (primary) hypertension: Secondary | ICD-10-CM | POA: Insufficient documentation

## 2011-02-05 DIAGNOSIS — R110 Nausea: Secondary | ICD-10-CM | POA: Insufficient documentation

## 2011-02-05 DIAGNOSIS — I509 Heart failure, unspecified: Secondary | ICD-10-CM | POA: Insufficient documentation

## 2011-02-05 DIAGNOSIS — F411 Generalized anxiety disorder: Secondary | ICD-10-CM | POA: Insufficient documentation

## 2011-02-05 DIAGNOSIS — R109 Unspecified abdominal pain: Secondary | ICD-10-CM

## 2011-02-05 DIAGNOSIS — N2889 Other specified disorders of kidney and ureter: Secondary | ICD-10-CM | POA: Insufficient documentation

## 2011-02-05 DIAGNOSIS — M810 Age-related osteoporosis without current pathological fracture: Secondary | ICD-10-CM | POA: Insufficient documentation

## 2011-02-05 DIAGNOSIS — Z79899 Other long term (current) drug therapy: Secondary | ICD-10-CM | POA: Insufficient documentation

## 2011-02-05 DIAGNOSIS — E7800 Pure hypercholesterolemia, unspecified: Secondary | ICD-10-CM | POA: Insufficient documentation

## 2011-02-05 DIAGNOSIS — N280 Ischemia and infarction of kidney: Secondary | ICD-10-CM

## 2011-02-05 LAB — COMPREHENSIVE METABOLIC PANEL
ALT: 11 U/L (ref 0–35)
AST: 15 U/L (ref 0–37)
Albumin: 3.7 g/dL (ref 3.5–5.2)
Alkaline Phosphatase: 82 U/L (ref 39–117)
BUN: 8 mg/dL (ref 6–23)
Chloride: 94 mEq/L — ABNORMAL LOW (ref 96–112)
Potassium: 3.3 mEq/L — ABNORMAL LOW (ref 3.5–5.1)
Sodium: 133 mEq/L — ABNORMAL LOW (ref 135–145)
Total Bilirubin: 0.9 mg/dL (ref 0.3–1.2)

## 2011-02-05 LAB — DIFFERENTIAL
Basophils Relative: 0 % (ref 0–1)
Eosinophils Absolute: 0 10*3/uL (ref 0.0–0.7)
Eosinophils Relative: 0 % (ref 0–5)
Monocytes Absolute: 0.6 10*3/uL (ref 0.1–1.0)
Monocytes Relative: 7 % (ref 3–12)
Neutrophils Relative %: 87 % — ABNORMAL HIGH (ref 43–77)

## 2011-02-05 LAB — PROTIME-INR: INR: 2.38 — ABNORMAL HIGH (ref 0.00–1.49)

## 2011-02-05 LAB — URINALYSIS, ROUTINE W REFLEX MICROSCOPIC
Bilirubin Urine: NEGATIVE
Ketones, ur: 15 mg/dL — AB
Leukocytes, UA: NEGATIVE
Nitrite: NEGATIVE
Protein, ur: NEGATIVE mg/dL
Urobilinogen, UA: 0.2 mg/dL (ref 0.0–1.0)
pH: 7 (ref 5.0–8.0)

## 2011-02-05 LAB — CBC
HCT: 33.6 % — ABNORMAL LOW (ref 36.0–46.0)
RDW: 12.4 % (ref 11.5–15.5)
WBC: 8.7 10*3/uL (ref 4.0–10.5)

## 2011-02-05 MED ORDER — ONDANSETRON HCL 4 MG/2ML IJ SOLN
INTRAMUSCULAR | Status: AC
  Administered 2011-02-05: 4 mg via INTRAVENOUS
  Filled 2011-02-05: qty 2

## 2011-02-05 MED ORDER — OXYCODONE-ACETAMINOPHEN 5-325 MG PO TABS: 1.0000 | ORAL_TABLET | Freq: Four times a day (QID) | ORAL | Status: AC | PRN

## 2011-02-05 MED ORDER — MORPHINE SULFATE 4 MG/ML IJ SOLN
4.0000 mg | Freq: Once | INTRAMUSCULAR | Status: AC
  Administered 2011-02-05: 4 mg via INTRAVENOUS
  Filled 2011-02-05: qty 1

## 2011-02-05 MED ORDER — SODIUM CHLORIDE 0.9 % IV BOLUS (SEPSIS)
1000.0000 mL | Freq: Once | INTRAVENOUS | Status: AC
  Administered 2011-02-05: 1000 mL via INTRAVENOUS

## 2011-02-05 MED ORDER — ONDANSETRON HCL 4 MG/2ML IJ SOLN
4.0000 mg | Freq: Once | INTRAMUSCULAR | Status: AC
  Administered 2011-02-05: 4 mg via INTRAVENOUS
  Filled 2011-02-05: qty 2

## 2011-02-05 NOTE — ED Notes (Signed)
Pt having right flank pain with right lower abdominal pain x 1 week.  Pt states she is having some nausea but no vomiting.  No diarrhea.  Pt states she was told she has a blood clot in her right kidney and was admitted to [facility] for same last week.

## 2011-02-05 NOTE — ED Notes (Signed)
Labs redrawn at request of lab

## 2011-02-05 NOTE — ED Provider Notes (Signed)
History     CSN: [id number]  Arrival date & time 02/05/11  1102   First MD Initiated Contact with Patient 02/05/11 1121      Chief Complaint  Patient presents with  . Abdominal Pain  . Flank Pain  . Nausea    (Consider location/radiation/quality/duration/timing/severity/associated sxs/prior treatment) HPI Comments: Patient is a 78 year old female who was discharged from the hospital 3 days ago after having bilateral renal infarcts from right throat emboli due to subtherapeutic Coumadin level and A. fib. She was evaluated at the time of her admission by vascular who saw no renal artery occlusion or thrombosis so there was no surgical procedure that can be done for the patient. It was felt that she needed better anticoagulations at that time. She states since going home she has had intermittent episodes of pain but it recurred again last night. It is in the same area that it was prior to her admission which is mostly in her right flank and then mildly in her abdomen. She states she burps a lot but no vomiting. Also for the last one week she has been constipated and has not been able to have a bowel movement. She spoke with her doctor yesterday and was prescribed medication for constipation which she took yesterday. She states that she had a moderate bowel movement this morning which did not seem to affect her pain. When she eats it does not make the pain any worse. She denies any dysuria or change in the urine color. She states the pain in her back is currently an 8/10 it does not radiate. Despite telling the nurse that she had right lower corporate abdominal pain when speaking with her on exam she only complains of right flank pain. She states that since leaving the hospital they did not give her any medication for pain and she feels that she needs something.  Patient is a 78 y.o. female presenting with flank pain. The history is provided by the patient.  Flank Pain This is a recurrent problem.  The current episode started yesterday. The problem occurs constantly. The problem has not changed since onset.Associated symptoms include abdominal pain. Pertinent negatives include no chest pain and no shortness of breath. Exacerbated by: Worsened by laying on her back and pushing on it. The symptoms are relieved by medications. She has tried nothing for the symptoms. The treatment provided no relief.    Past Medical History  Diagnosis Date  . Unspecified essential hypertension   . Congestive heart failure, unspecified   . Personal history of other diseases of circulatory system   . Pure hypercholesterolemia   . Type II or unspecified type diabetes mellitus without mention of complication, not stated as uncontrolled   . Anxiety state, unspecified   . Dysuria   . Rash and other nonspecific skin eruption   . Acute cystitis   . Diaphragmatic hernia without mention of obstruction or gangrene   . Benign neoplasm of colon   . Other acquired absence of organ   . Mixed incontinence urge and stress (female)(female)   . Osteoarthrosis, unspecified whether generalized or localized, unspecified site   . Lumbago   . Pain in joint, shoulder region   . Osteoporosis, unspecified   . Memory loss   . Headache     Past Surgical History  Procedure Date  . Cholecystectomy   . Rt.leg surgery 11/2010    History reviewed. No pertinent family history.  History  Substance Use Topics  . Smoking status: Never  Smoker   . Smokeless tobacco: Never Used  . Alcohol Use: No    OB History    Grav Para Term Preterm Abortions TAB SAB Ect Mult Living                  Review of Systems  Respiratory: Negative for cough, shortness of breath and wheezing.   Cardiovascular: Negative for chest pain, palpitations and leg swelling.  Gastrointestinal: Positive for abdominal pain and constipation. Negative for vomiting and diarrhea.       A lot of burping and mild nausea  Genitourinary: Positive for flank pain.  Negative for dysuria and hematuria.  Neurological: Negative for weakness.  All other systems reviewed and are negative.    Allergies  Azithromycin; Pregabalin; and Sulfonamide derivatives  Home Medications   Current Outpatient Rx  Name Route Sig Dispense Refill  . ASPIRIN 81 MG PO TBEC Oral Take 1 tablet (81 mg total) by mouth daily.    . ATENOLOL 50 MG PO TABS Oral Take 50 mg by mouth 2 (two) times daily.    [name] GABAPENTIN 100 MG PO CAPS Oral Take 1 tablet by mouth 3 (three) times daily.     [name] GLIPIZIDE 10 MG PO TABS Oral Take 10 mg by mouth 2 (two) times daily before a meal.    . METFORMIN HCL 500 MG PO TABS Oral Take 1,000 mg by mouth 2 (two) times daily with a meal.    . SIMVASTATIN 40 MG PO TABS Oral Take 40 mg by mouth at bedtime.      . TAMSULOSIN HCL 0.4 MG PO CAPS Oral Take 0.4 mg by mouth daily.    [name] VITAMIN B-12 1000 MCG PO TABS Oral Take 1,000 mcg by mouth daily.      . WARFARIN SODIUM 5 MG PO TABS Oral Take 2.5-5 mg by mouth daily. Rotates. Takes 1 tablet one day, then 1/2 the next.    [name] TRAMADOL HCL 50 MG PO TABS Oral Take 50 mg by mouth 3 (three) times daily as needed. For pain.      BP 172/95  Pulse 104  Temp(Src) 98.3 F (36.8 C) (Oral)  Resp 20  Ht 5\' 5"  (1.651 m)  Wt 123 lb (55.792 kg)  BMI 20.47 kg/m2  SpO2 98%  Physical Exam  Nursing note and vitals reviewed. Constitutional: She is oriented to person, place, and time. She appears well-developed and well-nourished. No distress.  HENT:  Head: Normocephalic and atraumatic.  Mouth/Throat: Oropharynx is clear and moist.  Eyes: Conjunctivae and EOM are normal. Pupils are equal, round, and reactive to light.  Cardiovascular: Normal rate, normal heart sounds and intact distal pulses.  An irregularly irregular rhythm present. Exam reveals no friction rub.   No murmur heard. Pulmonary/Chest: Effort normal and breath sounds normal. She has no wheezes. She has no rales.  Abdominal: Soft. Bowel sounds are normal.  She exhibits no distension. There is tenderness in the left lower quadrant. There is CVA tenderness. There is no rebound and no guarding. No hernia.       Mild tenderness over the left side of the abdomen. No tenderness to light or deep palpation in the right lower or right upper quadrant. Moderate pain in the right CVA area with no tenderness over the left CVA  Musculoskeletal: Normal range of motion. She exhibits no tenderness.       No edema  Neurological: She is alert and oriented to person, place, and time. No cranial nerve deficit.  Skin: Skin is warm and dry. No rash noted.  Psychiatric: She has a normal mood and affect. Her behavior is normal.    ED Course  Procedures (including critical care time)  Labs Reviewed  URINALYSIS, ROUTINE W REFLEX MICROSCOPIC - Abnormal; Notable for the following:    Glucose, UA >1000 (*)    Hgb urine dipstick SMALL (*)    Ketones, ur 15 (*)    All other components within normal limits  URINE MICROSCOPIC-ADD ON - Abnormal; Notable for the following:    Squamous Epithelial / LPF FEW (*)    Bacteria, UA FEW (*)    All other components within normal limits  PROTIME-INR - Abnormal; Notable for the following:    Prothrombin Time 26.4 (*)    INR 2.38 (*)    All other components within normal limits  CBC - Abnormal; Notable for the following:    RBC 3.73 (*)    Hemoglobin 11.8 (*)    HCT 33.6 (*)    All other components within normal limits  COMPREHENSIVE METABOLIC PANEL - Abnormal; Notable for the following:    Sodium 133 (*)    Potassium 3.3 (*)    Chloride 94 (*)    Glucose, Bld 266 (*)    Creatinine, Ser 0.40 (*)    All other components within normal limits  DIFFERENTIAL - Abnormal; Notable for the following:    Neutrophils Relative 87 (*)    Lymphocytes Relative 6 (*)    Lymphs Abs 0.5 (*)    All other components within normal limits  CBC  DIFFERENTIAL  COMPREHENSIVE METABOLIC PANEL   No results found.   1. Flank pain   2. Renal  infarct       MDM   Patient with recent hospitalization and discharge 3 days ago after having bilateral renal infarcts. This was thought to be from microemboli do to her A. fib and subtherapeutic Coumadin level. She had both CT and ultrasound done of the kidneys which confirmed the infarct but showed no other signs of abnormalities. At the time of hospital discharge she had a normal creatinine and urine without any signs of infection. She states she is doing okay at home with intermittent episodes of pain until last night the pain returned and made it hard for her to sleep because the pain was worse when she laid on her back. She has continued taking her Coumadin and denies any fever, vomiting, diarrhea. She states that over the last week she has been constipated and unable to have a bowel movement most likely from the pain medication she received in the hospital. She used an enema and also took magnesium citrate and states she had a small bowel movement this morning but it did not relieve her pain. Patient is mildly uncomfortable on exam with tachycardia of 104 and blood pressure of 170/95.  There is no point tenderness in her abdomen and just mildly tender on the left side. Feel most likely this is persistent pain after the known infarct. And she is not taking any pain medication. Her Coumadin today is therapeutic with an INR of 2.38. CBC with a stable hemoglobin and normal white blood cell count.  UA is within normal limits. CMP is pending.  CMP is within normal limits. On reevaluation patient is feeling better after pain medication. All of her pain is in the right flank area on her exam without any peritoneal or abdominal findings.  Feel patient's symptoms are most likely related to the  known renal infarcts. Due to no peritoneal findings, low suspicion for mesenteric ischemia and normal urine and creatinine do not feel that patient needs to be reimaged at this time due to the risk of IV contrast.  Discussed with patient that the pain is expected after renal infarcts. She has not been taking any medication for pain despite the tramadol that is on her meds she denies taking it. Will have her continue Dulcolax suppositories to prevent constipation will give her pain medication to use as needed.     2:41 PM Patient is feeling better wants to go home ambulating back and forth to the bathroom. She will return if symptoms worsen and follow up with her doctor on Monday.  Gwyneth Sprout, MD 02/06/11 [phone]

## 2011-02-05 NOTE — ED Notes (Signed)
Patient states that nausea relieved

## 2011-02-07 ENCOUNTER — Encounter: Payer: Medicare Other | Admitting: *Deleted

## 2011-02-09 ENCOUNTER — Encounter: Payer: Self-pay | Admitting: Physician Assistant

## 2011-02-09 ENCOUNTER — Ambulatory Visit (INDEPENDENT_AMBULATORY_CARE_PROVIDER_SITE_OTHER): Payer: Medicare Other | Admitting: Physician Assistant

## 2011-02-09 DIAGNOSIS — I4891 Unspecified atrial fibrillation: Secondary | ICD-10-CM

## 2011-02-09 DIAGNOSIS — I509 Heart failure, unspecified: Secondary | ICD-10-CM

## 2011-02-09 DIAGNOSIS — I10 Essential (primary) hypertension: Secondary | ICD-10-CM

## 2011-02-09 DIAGNOSIS — N2889 Other specified disorders of kidney and ureter: Secondary | ICD-10-CM

## 2011-02-09 DIAGNOSIS — N280 Ischemia and infarction of kidney: Secondary | ICD-10-CM

## 2011-02-09 LAB — PROTIME-INR: Prothrombin Time: 14.8 s — ABNORMAL HIGH (ref 10.2–12.4)

## 2011-02-09 LAB — BASIC METABOLIC PANEL
BUN: 10 mg/dL (ref 6–23)
CO2: 31 mEq/L (ref 19–32)
Calcium: 9.1 mg/dL (ref 8.4–10.5)
Chloride: 99 mEq/L (ref 96–112)
Creatinine, Ser: 0.7 mg/dL (ref 0.4–1.2)
GFR: 87.36 mL/min (ref >60.00)
Glucose, Bld: 133 mg/dL — ABNORMAL HIGH (ref 70–99)
Potassium: 3.3 mEq/L — ABNORMAL LOW (ref 3.5–5.1)
Sodium: 139 mEq/L (ref 135–145)

## 2011-02-09 NOTE — Patient Instructions (Addendum)
Your physician recommends that you return for lab work in: TODAY BMET, PT/INR 401.1, 427.31  Your physician recommends that you schedule a follow-up appointment in: 2-3 MONTHS WITH DR. Ladona Ridgel

## 2011-02-09 NOTE — Progress Notes (Signed)
[address] [place], [place]  [address] Phone: [phone] Fax:  [phone]  Date:  02/09/2011   Name:  Michelle Herrera       DOB:  02-04-1930 MRN:  [id number]  PCP:  Dr. Kriste Basque Primary Cardiologist:  Dr. Dietrich Pates  Primary Electrophysiologist:  Dr. Lewayne Bunting    History of Present Illness: Michelle Herrera is a 78 y.o. female who presents for follow up.  She has a history of permanent atrial fibrillation and congestive heart failure.  She has failed Tikosyn and amiodarone therapy.  Myoview in 2006 was negative for ischemia, EF 54%.  Echocardiogram 01/2011: Septal hypokinesis, mild LVH, EF 40-45%, diast dysfunction, mild-moderate MR, moderate-severe LAE, moderate-severe RVE, moderate-severe decreased RVSF., moderate-severe LAE, moderate TR, PASP 45.  Other history includes diabetes, hypertension hyperlipidemia.  She was last seen by Dr. Ladona Ridgel 9/12.  She was seen for preoperative clearance at that time.  It was felt that her stroke risk was significant enough that she would require bridging Lovenox while off Coumadin.   She was recently admitted 1/21-1/23.  She presented with acute onset right flank pain.  CT suggested renal infarct likely secondary to microemboli.  Of note, she had a sub-therapeutic INR upon admission.  She was seen by vascular surgery.  There were no surgical options.  Echocardiogram was obtained with the results noted above.  Labs: Potassium 3.3, creatinine 0.40, ALT 11, hemoglobin 11.8.  Discharge INR 2.44.  Chest x-ray negative for acute findings.  Renal ultrasound 02/01/11: Small amount of perinephric fluid on the right, no hydronephrosis, no focal mass identified, lack of focal mass lesions consistent with CT findings of focal perfusion abnormalities, consistent with pyelonephritis versus infarct.  Patient had no infectious symptoms.  Doing well.  No further flank pain.  The patient denies chest pain, shortness of breath, syncope, orthopnea,  PND or significant pedal edema.  She has coumadin followed in our office.    Past Medical History  Diagnosis Date  . HTN (hypertension)   . Chronic systolic heart failure   . Pure hypercholesterolemia   . Type II or unspecified type diabetes mellitus without mention of complication, not stated as uncontrolled   . Anxiety state, unspecified   . Diaphragmatic hernia without mention of obstruction or gangrene   . Benign neoplasm of colon   . Mixed incontinence urge and stress (female)(female)   . Osteoarthrosis, unspecified whether generalized or localized, unspecified site   . Lumbago   . Pain in joint, shoulder region   . Osteoporosis, unspecified   . Memory loss   . Renal infarction     01/2011 in setting of low INR  . History of stroke     Current Outpatient Prescriptions  Medication Sig Dispense Refill  . aspirin EC 81 MG EC tablet Take 1 tablet (81 mg total) by mouth daily.      [name] atenolol (TENORMIN) 50 MG tablet Take 50 mg by mouth 2 (two) times daily.      [name] gabapentin (NEURONTIN) 100 MG capsule Take 1 tablet by mouth 3 (three) times daily.       [name] glipiZIDE (GLUCOTROL) 10 MG tablet Take 10 mg by mouth 2 (two) times daily before a meal.      . metFORMIN (GLUCOPHAGE) 500 MG tablet Take 1,000 mg by mouth 2 (two) times daily with a meal.      . simvastatin (ZOCOR) 40 MG tablet Take 40 mg by mouth at bedtime.        [name]  Tamsulosin HCl (FLOMAX) 0.4 MG CAPS Take 0.4 mg by mouth daily.      . vitamin B-12 (CYANOCOBALAMIN) 1000 MCG tablet Take 1,000 mcg by mouth daily.        [name] warfarin (COUMADIN) 5 MG tablet Take 2.5-5 mg by mouth daily. Rotates. Takes 1 tablet one day, then 1/2 the next.      [name] oxyCODONE-acetaminophen (PERCOCET) 5-325 MG per tablet Take 1-2 tablets by mouth every 6 (six) hours as needed for pain.  15 tablet  0    Allergies: Allergies  Allergen Reactions  . Azithromycin     REACTION: pt states INTOL to ZPak  . Pregabalin     REACTION: pt states INTOL to Lyrica  .  Sulfonamide Derivatives     REACTION: SWELLING    History  Substance Use Topics  . Smoking status: Never Smoker   . Smokeless tobacco: Never Used  . Alcohol Use: No     ROS:  Please see the history of present illness.    All other systems reviewed and negative.   PHYSICAL EXAM: VS:  There were no vitals taken for this visit. Well nourished, well developed, in no acute distress HEENT: normal Neck: no JVD Cardiac:  normal S1, S2; RRR; no murmur Lungs:  clear to auscultation bilaterally, no wheezing, rhonchi or rales Abd: soft, nontender, no hepatomegaly Ext: trace bilat edema Skin: warm and dry Neuro:  CNs 2-12 intact, no focal abnormalities noted  EKG:  Atrial fibrillation, low voltage, heart rate 79, normal axis, inferior T-wave inversions, no significant changes compared to prior tracing  ASSESSMENT AND PLAN:

## 2011-02-09 NOTE — Assessment & Plan Note (Signed)
Maintaining good rate control.  She remains on Coumadin and is followed in our clinic.  Given her presentation with a renal infarct in the setting of a subtherapeutic INR, she would likely be a good candidate for one of the novel Anticoagulant agents.  I will review this with Dr. Ladona Ridgel.  Followup with Dr. Ladona Ridgel in 2-3 months.

## 2011-02-09 NOTE — Assessment & Plan Note (Signed)
She has systolic and diastolic dysfunction.  Volume is stable.  Continue current meds.  Follow in 2-3 mos.

## 2011-02-09 NOTE — Assessment & Plan Note (Signed)
BP elevated today.  Usually better.  No changes today.  IF remains elevated, could consider adding ACE in light of her reduced LV function.

## 2011-02-09 NOTE — Assessment & Plan Note (Signed)
Repeat BMET today.  See discussion above.

## 2011-02-10 ENCOUNTER — Other Ambulatory Visit: Payer: Self-pay | Admitting: *Deleted

## 2011-02-10 DIAGNOSIS — I4891 Unspecified atrial fibrillation: Secondary | ICD-10-CM

## 2011-02-10 MED ORDER — RIVAROXABAN 20 MG PO TABS: 20.0000 mg | ORAL_TABLET | Freq: Every day | ORAL | Status: DC

## 2011-02-10 NOTE — Telephone Encounter (Signed)
Agree Spoke with patient by phone and will change her to Xarelto 20 mg QD. Tereso Newcomer, PA-C  5:50 PM 02/10/2011

## 2011-02-10 NOTE — Telephone Encounter (Signed)
D/c coumadin per GT/SW and start xarelto 20 mg daily with cbc check in 4 weeks. Michelle Herrera

## 2011-02-11 ENCOUNTER — Ambulatory Visit: Payer: Self-pay | Admitting: Pharmacist

## 2011-02-11 DIAGNOSIS — I4891 Unspecified atrial fibrillation: Secondary | ICD-10-CM

## 2011-02-11 DIAGNOSIS — Z7901 Long term (current) use of anticoagulants: Secondary | ICD-10-CM

## 2011-02-11 NOTE — Progress Notes (Signed)
The patient's INR came back subtherapeutic.  Given her recent presentation with renal infarct in the setting of sub-therapeutic INR, she either needs Lovenox coverage until her INR is in the therapeutic range or we should switch her to one of the novel anticoagulants.  I discussed her case with Dr. Ladona Ridgel.  He agrees that we can switch her to either Pradaxa or Xarelto.  Her creatinine clearance is okay.  I discussed this with the patient over the telephone.  She is agreeable.  Therefore, she will stop Coumadin.  She will start on Xarelto 20 mg daily.  Samples of the left front desk for her.

## 2011-02-21 ENCOUNTER — Encounter: Payer: Medicare Other | Admitting: *Deleted

## 2011-02-22 ENCOUNTER — Inpatient Hospital Stay: Payer: Medicare Other | Admitting: Pulmonary Disease

## 2011-03-10 ENCOUNTER — Other Ambulatory Visit: Payer: Medicare Other

## 2011-04-12 ENCOUNTER — Other Ambulatory Visit: Payer: Self-pay | Admitting: Pulmonary Disease

## 2011-04-18 ENCOUNTER — Telehealth: Payer: Self-pay | Admitting: Internal Medicine

## 2011-04-18 NOTE — Telephone Encounter (Signed)
Close  

## 2011-04-19 ENCOUNTER — Ambulatory Visit (INDEPENDENT_AMBULATORY_CARE_PROVIDER_SITE_OTHER): Payer: Medicare Other | Admitting: Pulmonary Disease

## 2011-04-19 ENCOUNTER — Other Ambulatory Visit (INDEPENDENT_AMBULATORY_CARE_PROVIDER_SITE_OTHER): Payer: Medicare Other

## 2011-04-19 ENCOUNTER — Encounter: Payer: Self-pay | Admitting: Pulmonary Disease

## 2011-04-19 VITALS — BP 128/60 | HR 82 | Temp 97.1°F | Ht 65.0 in | Wt 129.6 lb

## 2011-04-19 DIAGNOSIS — M545 Low back pain, unspecified: Secondary | ICD-10-CM

## 2011-04-19 DIAGNOSIS — F411 Generalized anxiety disorder: Secondary | ICD-10-CM

## 2011-04-19 DIAGNOSIS — M810 Age-related osteoporosis without current pathological fracture: Secondary | ICD-10-CM

## 2011-04-19 DIAGNOSIS — N2889 Other specified disorders of kidney and ureter: Secondary | ICD-10-CM

## 2011-04-19 DIAGNOSIS — D649 Anemia, unspecified: Secondary | ICD-10-CM

## 2011-04-19 DIAGNOSIS — E7800 Pure hypercholesterolemia, unspecified: Secondary | ICD-10-CM

## 2011-04-19 DIAGNOSIS — R413 Other amnesia: Secondary | ICD-10-CM

## 2011-04-19 DIAGNOSIS — I4891 Unspecified atrial fibrillation: Secondary | ICD-10-CM

## 2011-04-19 DIAGNOSIS — K449 Diaphragmatic hernia without obstruction or gangrene: Secondary | ICD-10-CM

## 2011-04-19 DIAGNOSIS — E119 Type 2 diabetes mellitus without complications: Secondary | ICD-10-CM

## 2011-04-19 DIAGNOSIS — R531 Weakness: Secondary | ICD-10-CM

## 2011-04-19 DIAGNOSIS — D126 Benign neoplasm of colon, unspecified: Secondary | ICD-10-CM

## 2011-04-19 DIAGNOSIS — N280 Ischemia and infarction of kidney: Secondary | ICD-10-CM

## 2011-04-19 DIAGNOSIS — I10 Essential (primary) hypertension: Secondary | ICD-10-CM

## 2011-04-19 DIAGNOSIS — M1990 Unspecified osteoarthritis, unspecified site: Secondary | ICD-10-CM

## 2011-04-19 DIAGNOSIS — I509 Heart failure, unspecified: Secondary | ICD-10-CM

## 2011-04-19 DIAGNOSIS — D539 Nutritional anemia, unspecified: Secondary | ICD-10-CM

## 2011-04-19 DIAGNOSIS — E538 Deficiency of other specified B group vitamins: Secondary | ICD-10-CM

## 2011-04-19 LAB — HEPATIC FUNCTION PANEL
AST: 18 U/L (ref 0–37)
Albumin: 4.2 g/dL (ref 3.5–5.2)
Alkaline Phosphatase: 67 U/L (ref 39–117)
Bilirubin, Direct: 0.2 mg/dL (ref 0.0–0.3)
Total Protein: 6.9 g/dL (ref 6.0–8.3)

## 2011-04-19 LAB — CBC WITH DIFFERENTIAL/PLATELET
Basophils Absolute: 0 10*3/uL (ref 0.0–0.1)
Eosinophils Absolute: 0.1 10*3/uL (ref 0.0–0.7)
HCT: 38.5 % (ref 36.0–46.0)
Hemoglobin: 12.8 g/dL (ref 12.0–15.0)
Lymphocytes Relative: 12.4 % (ref 12.0–46.0)
Lymphs Abs: 0.7 10*3/uL (ref 0.7–4.0)
MCHC: 33.3 g/dL (ref 30.0–36.0)
Monocytes Relative: 6.6 % (ref 3.0–12.0)
Neutro Abs: 4.6 10*3/uL (ref 1.4–7.7)
Platelets: 197 10*3/uL (ref 150.0–400.0)
RDW: 14.9 % — ABNORMAL HIGH (ref 11.5–14.6)

## 2011-04-19 LAB — BASIC METABOLIC PANEL
CO2: 29 mEq/L (ref 19–32)
Glucose, Bld: 208 mg/dL — ABNORMAL HIGH (ref 70–99)
Potassium: 4.6 mEq/L (ref 3.5–5.1)
Sodium: 140 mEq/L (ref 135–145)

## 2011-04-19 LAB — LIPID PANEL: Total CHOL/HDL Ratio: 2

## 2011-04-19 NOTE — Patient Instructions (Addendum)
Today we updated your med list in our EPIC system...    Continue your current medications the same...  Be sure to add a Calcium supplement, Women's formula multivitamin, & Vit D ~1000u daily...    All this to help your bones...  Today we did your FASTING blood work...    We will call you w/ the results when avail...  Call for any questions...  Let's continue our 4 month follow up visits...   (Addendum:  Labs returned w/ poor DM control==> referred to Endocrinology)

## 2011-04-19 NOTE — Progress Notes (Signed)
Subjective:    Patient ID: Michelle Herrera, female    DOB: 08-01-1930, 78 y.o.   MRN: [id number]  HPI 78 y/o WF here for a follow up visit... she has mult medical problems including:    ~  October 13, 2009:  she saw DrNorris for Ortho w/ back & shoulder XRays & rec for PT & shot in shoulder (min help, refuses PT)... notes incr BS w/ shoulder injection & under incr stress (son dx w/ throat cancer)... now c/o pain in left chest wall w/ tender 11th & 12th ribs & costal margin- intol Tramadol, just using Tylenol & we discussed rest/ heat/ rib binder & try Flector patch... she saw DrTaylor for f/u AFib 8/11- stable rate control strategy, no changes made... f/u DM labs today showed BS=322, A1c=8.3 & call to Pharm confirms not taking Metformin or Glipizide regularly!  ~  April 14, 2010:  47mo ROV & she reports [facility] 11/11 by Neuro for "light stroke" w/ full recovery (SEE BELOW) & continued on her Coumadin by DrWillis & no other changes made; requests refill prescriptions today, & we will check non-fasting blood work...  ~  August 16, 2010:  65mo ROV & she reports stable overall> BP controlled & she denies CP, palpit, dizzy, etc; Fasting labs today showed FLP looks good on Simva40, & A1c improved to 7.3 on her ...     She had Urology f/u by [facility] 4/12> for eval urinary incont, prev eval showed sm capacity unstable bladder, tried Detrol w/ some improvement; recently tried Enablex w/o much improvement; she states "I've got an itty-bitty bladder & there's not much he can do"...    She has been followed by DrNorris for Ortho> 3/12 note reviewed, right knee arthritis w/ persist pain s/p prev arthroscopies; she has received cortisone shots & recent series of ?synvisc, but it looks like she may need to consider TKR- she wants to hold off as long as possible...  ~  December 17, 2010:  65mo ROV & she reports a good interval- had a nice Thanksgiving & planning a great Christmas;  No change in meds> BP controlled  on Aten & she denies CP, palpit, dizzy, syncope, SOB, edema, etc;  Hx PAF & she continues on Coumadin followed by DrTaylor w/ rate control strategy;  FLP remains good on Simva40- tol well;  DM control improved on Metform/ Glipiz/ Diet;  She is c/o persist urinary symptoms w/ burning & leaking- followed by DrKimbrough et al;  She has DJD, LBP, Shoulder pain- treated by DrRendall/ DrNorris w/ knee surg 9/12 as outpt for torn cartilage (sl better post-op but requiring Percocet/ Tramadol)...  ~  April 19, 2011:  65mo ROV & post hosp visit> Adm 1/13 by [facility] w/ flank pain and found to have a renal infarct (& sub therapeutic INR); CT Abd revealed abn perfusion to the cortex of both kidneys raising the question of infarcts; UA was neg & cult- no growth; she was seen by VascSurg & no surg needed; her pain was relieved after one shot of MS; she has followed up w/ Urology DrEskridge- hx incontinence & small capacity bladder, notes reviewed, tried Myrbetriq w/o much benenfit she says...    Sugars were not well controlled in the hosp w/ mBS~ 153-209 & A1c=7.4 ==> A1c is now 8.6 & we will refer to Endocrinology...    She saw Cards 1/13 for f/u AFib, HBP, CHF> she was switched from Coumadin to XARELTO (due to the renal infarcts & difficulty  maintaining INR... LABS 4/13:  FLP- fair on Simva40 w/ LDL 112;  Chems= ok x BS=208, A1c=8.6;  CBC- ok;  TSH=3.89;  VitD= 30;  VitB12=905          Problem List:      PROBLEM LIST UPDATED 04/19/11 >>  HYPERTENSION - on ATENOLOL 50mg  Bid... VALVULAR HEART DISEASE   ATRIAL FIBRILLATION - stable on Rx w/ COUMADIN (followed in clinic) & rate control strategy. ~  4/11:  she reports that she stopped her Lanoxin 0.125 on her own & doesn't want to restart. ~  8/11:  f/u DrTaylor & stable on Coumadin w/ rate control strategy, no changes made. ~  11/11:  Hosp by Neuro w/ TIA> 2DEcho showed mild LVH, diffuse HK w/ EF= 45%, thick pos MV leaflet w/ restrict motion & modMR, dilated LA&RA, severeTR  & PAsys=49, can't r/o PFO... ~  8/12:  her BP=116/84 and she feels OK- denies HA, fatigue, visual changes, CP, palipit, dizziness, syncope, dyspnea, edema, etc... ~  12/12:  BP= 126/88 and she remains asymptomatic as above... ~  1/13:  2DEcho showed mild LVH w/ EF= 40-45% & septal HK, Diastolic dysfunction, modMR, severe LAE, modTR, Pasys=45 ~  CXR 1/13 showed marked cardiomegaly, pulm vasc congestion, NAD... ~  4/13:  BP= 128/60 and she is feeling better she says;  BUN=15, Creat=0.8  HYPERCHOLESTEROLEMIA - on diet + SIMVASTATIN 40mg /d... ~  FLP 2007 showed TChol 160, TG 143, HDL 55, LDL 76 ~  FLP 7/09 showed TChol 165, TG 129, HDL 60, LDL 80 ~  FLP 10/10 showed TChol 175, TG 68, HDL 80, LDL 82 ~  FLP 4/11 showed TChol 207, TG 104, HDL 84, LDL 111... rec better diet, continue same med. ~  FLP 11/11 on Simva40 showed TChol 180, TG 71, HDL 67, LDL 99 ~  FLP 8/12 on Simva40 showed TChol 196, TG 96, HDL 89, LDL 88 ~  FLP 4/13 on Simva40 showed TChol 211, TG 77, HDL 85, LDL 112... rec better diet, same med.  DM - on METFORMIN 500mg - 2tabsBid, GLIPZIDE 10mg Bid (not taking Januvia due to $$)... ~  labs 1/09 showed BS= 148, HgA1c= 7.1.Marland KitchenMarland Kitchen continue meds + better diet... ~  labs 7/09 showed BS= 168, HgA1c= 7.6.Marland KitchenMarland Kitchen incr Metform 2Bid, contin Glip 10Bid & Januv... ~  she stopped the Januvia due to cost... ~  labs 4/10 showed BS= 185, Aic= 8.0.Marland KitchenMarland Kitchen reminder to take meds regularly. ~  labs 10/10 on Metform2Bid+Glip10Bid showed BS= 124, A1c= 6.7 ~  labs 4/11 showed BS= 162, A1c= 6.8.[name]. rec> better diet, take meds regularly. ~  labs 10/11 showed BS= 322, A1c= 8.3.Marland KitchenMarland Kitchen Pharm confirms not taking meds regularly!!! Discussed w/ pt... ~  Labs 4/12 showed BS= 91, A1c= 7.9.Marland KitchenMarland Kitchen Rec> keep same meds, take regularly, better low carb diet... ~  Labs 8/12 on Metform2Bid+Glip10Bid showed BS= 114, A1c= 7.3.Marland KitchenMarland Kitchen Continue same... ~  Labs in hosp 1/13 showed BS~ 153-209 & A1c=7.4... NOTE: she has refused other meds due to $$$ ~   Labs 4/13 on Metform2Bid+Glipiz10Bid showed BS= 208, A1c= 8.6... She is referred to Endocrinology  HIATAL HERNIA (ICD-553.3) - last EGD by DrStark was 3/03 and showed a HH, reflux...  COLONIC POLYPS (ICD-211.3) - last colonoscopy 3/03 by DrStark showed divertics and 5mm polyp (no path avail).  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 pt reports that there is nothing they can do to help... ~  she has had several UTI's.Marland KitchenMarland Kitchen  then perineal rash & saw GYN= neg PAP & Rx yeast infection- resolved. ~  She will f/u w/ Urology for persistant symptoms, seen by DrEskridge w/ trial Myrbetriq but not much benefit per pt...  DEGENERATIVE JOINT DISEASE (ICD-715.90) - she needs right TKR but wants to put this off as long as poss... ~  4/12:  Ortho eval by DrNorris, hx prev knee arthroscopies, given Cortisone shot, & he plans ?Synvisc series... ~  9/12:  outpt knee surg to repair torn cartilage per DrNorris; preop clearance by DrTaylor & [facility]in [facility]...  BACK PAIN, LUMBAR (ICD-724.2) - XRays showed scoliosis, facet degen arthritis, & osteopenia...  she has used ROBAXIN Prn... ~  4/12:  She reports eval from DrNorris w/ "arthritis all down my backbone" & he rec epid steroid injection, but she reports improved on NEURONTIN 100mg  Tid... ~  4/13:  She reports that back pain has improved but she still has some leg pain but notes that "it's tolerable"...  SHOULDER PAIN, RIGHT (ICD-719.41) - prev eval & Rx by DrRendall... s/p right shoulder surg 3/09... XRays showed calcium deposit, rotator cuff prob, & intol to Tramadol...  OSTEOPOROSIS (ICD-733.00) ~  labs 10/10 w/ Vit D level = 32... rec> start Vit D OTC 1000 u daily. ~  Labs 4/13 showed Vit D level = 30... rec to incr to 2000u daily...  MEMORY LOSS (ICD-780.93) - concern for her memory expressed 7/09 OV- tried Aricept but she stopped it- "no different". TIA/  INFARCT - she was [facility] 11/11 by DrWillis w/ left sided weakness, ?left facial droop, & MRI showed 2 sm infarcts on right (frontal & ?pontine);  MRA was neg;  CDopplers were neg;  Deficits all cleared rapidly;  2DEcho was abn- see above;  She was continued on her Coumadin Rx- no changes made... ~  4/13:  She was offered memory medications but she declined...  ANXIETY (ICD-300.00) - stress in family... 2 y/o granddau w/ seizures...  VITAMIN B12 DEFICIENCY >> on OTC Vit B-12 tabs orally> 1034mcg/d... ~  Labs 7/11 showed Vit V12 level = 135... Started on Vit B12 supplement w/ 1052mcg/d... ~  Labs 4/13 showed Vit B12 level = 905... Ok top decr to 1/2 tab daily...   Past Surgical History  Procedure Date  . Cholecystectomy   . Rt.leg surgery 11/2010    Outpatient Encounter Prescriptions as of 04/19/2011  Medication Sig Dispense Refill  . aspirin EC 81 MG EC tablet Take 1 tablet (81 mg total) by mouth daily.      [name] atenolol (TENORMIN) 50 MG tablet Take 50 mg by mouth 2 (two) times daily.      [name] gabapentin (NEURONTIN) 100 MG capsule Take 1 tablet by mouth 3 (three) times daily.       [name] glipiZIDE (GLUCOTROL) 10 MG tablet Take 10 mg by mouth 2 (two) times daily before a meal.      . metFORMIN (GLUCOPHAGE) 500 MG tablet TAKE TWO TABLETS BY MOUTH TWICE DAILY WITH MEALS  120 tablet  11  . Rivaroxaban (XARELTO) 20 MG TABS Take 20 mg by mouth daily.  30 tablet  11  . vitamin B-12 (CYANOCOBALAMIN) 1000 MCG tablet Take 1,000 mcg by mouth daily.        . simvastatin (ZOCOR) 40 MG tablet Take 40 mg by mouth at bedtime.        [name] DISCONTD: metFORMIN (GLUCOPHAGE) 500 MG tablet Take 1,000 mg by mouth 2 (two) times daily with a meal.      .  DISCONTD: Tamsulosin HCl (FLOMAX) 0.4 MG CAPS Take 0.4 mg by mouth daily.         Allergies  Allergen Reactions  . Azithromycin     REACTION: pt states INTOL to ZPak  . Pregabalin     REACTION: pt states INTOL to Lyrica  . Sulfonamide Derivatives     REACTION: SWELLING      Review of Systems        See HPI - all other systems neg except as noted... The patient complains of weight loss, chest pain, dyspnea on exertion, and muscle weakness.  The patient denies anorexia, fever, weight gain, vision loss, decreased hearing, hoarseness, syncope, peripheral edema, prolonged cough, headaches, hemoptysis, abdominal pain, melena, hematochezia, severe indigestion/heartburn, hematuria, incontinence, suspicious skin lesions, transient blindness, difficulty walking, depression, unusual weight change, abnormal bleeding, enlarged lymph nodes, and angioedema.     Objective:   Physical Exam      WD, WN, 78 y/o WF in NAD...  GENERAL:  Alert & oriented; pleasant & cooperative... HEENT:  [place]/AT, EOM-wnl, PERRLA, EACs-clear, TMs-wnl, NOSE-clear, THROAT-clear & wnl. NECK:  Supple w/ fairROM; no JVD; normal carotid impulses w/o bruits; no thyromegaly or nodules palpated; no lymphadenopathy. CHEST:  Clear to P & A; without wheezes/ rales/ or rhonchi; +tender left 11th-12th ribs & costal margin. HEART:  Iregular Rhythm= AFib; without murmurs/ rubs/ or gallops heard... ABDOMEN:  Soft & nontender; normal bowel sounds; no organomegaly or masses detected. EXT: without deformities, mild arthritic changes; no varicose veins/ venous insuffic/ or edema. NEURO:  CN's intact;  no focal neuro deficits found... DERM:  No lesions noted; no rash etc...  RADIOLOGY DATA:  Reviewed in the EPIC EMR & discussed w/ the patient...  LABORATORY DATA:  Reviewed in the EPIC EMR & discussed w/ the patient...   Assessment & Plan:   HBP>  Stable on BBlocker Rx, continue same...  AFIB>  She had abn 2DEcho in [facility] 11/11 & 1/13- reviewed by Cards- she sees DrTaylor et al & switched to XARELTO from Coumadin...  CHOL>  FLP looks fair on the Simva40 + diet rx;  reminded about low fat diet & taking med daily...  DM>  A1c is 8.6 now on same meds- previously control has been difficult due to lack of compliance  w/ med rx- we reviewed again the importance of regular dosing for her control issues/ dose adjustments; time to refer to Endocrinology for their help...  RENAL INFARCTION>  Adm 1/13 w/ renal infarcts felt due to micro-emboli & inadeq INR ==> changed to XARELTO... URINARY INCONTINENCE>  Followed by Lysbeth Penner now Eskridge & she feels that there is nothing they can do to help her; wears pads; offered second opinion & she will consider it...  ORTHO>  Followed by DrNorris & she is s/p cortisone shots in right knee & ?Synvisc series; she had arthroscopic surg for torn cartilage 9/12 she says & she doesn't want TKR.  STROKE>  She has had a CVA w/ hosp by Neuro 11/11 [facility] records all reviewed);  She was continued on her Coumadin per DrWillis & followed in [facility];  Stable w/o recurrent cerebral ischemic symptoms...  Other medical problems as noted... She will continue the Calcium, MVI, Vit D, & Vit B12 regimen...   Patient's Medications  New Prescriptions   No medications on file  Previous Medications   ASPIRIN EC 81 MG EC TABLET    Take 1 tablet (81 mg total) by mouth daily.   ATENOLOL (TENORMIN) 50 MG TABLET  Take 50 mg by mouth 2 (two) times daily.   GABAPENTIN (NEURONTIN) 100 MG CAPSULE    Take 1 tablet by mouth 3 (three) times daily.    GLIPIZIDE (GLUCOTROL) 10 MG TABLET    Take 10 mg by mouth 2 (two) times daily before a meal.   METFORMIN (GLUCOPHAGE) 500 MG TABLET    TAKE TWO TABLETS BY MOUTH TWICE DAILY WITH MEALS   RIVAROXABAN (XARELTO) 20 MG TABS    Take 20 mg by mouth daily.   SIMVASTATIN (ZOCOR) 40 MG TABLET    Take 40 mg by mouth at bedtime.     VITAMIN B-12 (CYANOCOBALAMIN) 1000 MCG TABLET    Take 1,000 mcg by mouth daily.    Modified Medications   No medications on file  Discontinued Medications   METFORMIN (GLUCOPHAGE) 500 MG TABLET    Take 1,000 mg by mouth 2 (two) times daily with a meal.   TAMSULOSIN HCL (FLOMAX) 0.4 MG CAPS    Take 0.4 mg by mouth daily.

## 2011-04-20 ENCOUNTER — Ambulatory Visit: Payer: Medicare Other | Admitting: Pulmonary Disease

## 2011-04-20 LAB — TSH: TSH: 3.89 u[IU]/mL (ref 0.35–5.50)

## 2011-04-21 LAB — HEMOGLOBIN A1C: Hgb A1c MFr Bld: 8.6 % — ABNORMAL HIGH (ref 4.6–6.5)

## 2011-04-22 ENCOUNTER — Other Ambulatory Visit: Payer: Self-pay | Admitting: Pulmonary Disease

## 2011-04-22 DIAGNOSIS — E119 Type 2 diabetes mellitus without complications: Secondary | ICD-10-CM

## 2011-05-02 ENCOUNTER — Telehealth: Payer: Self-pay | Admitting: Pulmonary Disease

## 2011-05-02 NOTE — Telephone Encounter (Signed)
Reviewed pt's MAR.  Flomax isn't listed.  Pt was recently seen by SN on 04/19/11 and looks like it was d/c'd then.  ATC pt to get more information regarding this.  ATC- NA. And no option to leave message.  WCB

## 2011-05-03 MED ORDER — TAMSULOSIN HCL 0.4 MG PO CAPS: 0.4000 mg | ORAL_CAPSULE | Freq: Every day | ORAL | Status: DC

## 2011-05-03 NOTE — Telephone Encounter (Signed)
Per SN---ok to send in flomax 4 mg  #30  1 po qhs.  No refills---pt will need to call us with response to med.  Refer to urology if not getting better.  thanks

## 2011-05-03 NOTE — Telephone Encounter (Signed)
Pt aware and rx sent. Roselani Grajeda, CMA  

## 2011-05-03 NOTE — Telephone Encounter (Signed)
Pt returned call.  She c/o her "bladder leaking" during the day and qhs.  Pt reports this has been going on "for a year or so" but has worsened over the last year.  States she has never tried Flomax before but has heard that it can help with bladder issues.  She would like a 30 day rx sent to [facility] on [name] if SN thinks it will help with her symptoms.  Dr. Kriste Basque, pls advise.  Thank you.  Allergies verified  Allergies  Allergen Reactions  . Azithromycin     REACTION: pt states INTOL to ZPak  . Pregabalin     REACTION: pt states INTOL to Lyrica  . Sulfonamide Derivatives     REACTION: SWELLING

## 2011-05-05 ENCOUNTER — Ambulatory Visit (INDEPENDENT_AMBULATORY_CARE_PROVIDER_SITE_OTHER): Payer: Medicare Other | Admitting: Internal Medicine

## 2011-05-05 ENCOUNTER — Encounter: Payer: Self-pay | Admitting: Internal Medicine

## 2011-05-05 VITALS — BP 145/85 | HR 75 | Ht 65.0 in | Wt 129.1 lb

## 2011-05-05 DIAGNOSIS — I10 Essential (primary) hypertension: Secondary | ICD-10-CM

## 2011-05-05 DIAGNOSIS — I4891 Unspecified atrial fibrillation: Secondary | ICD-10-CM

## 2011-05-05 NOTE — Progress Notes (Signed)
HPI Michelle Herrera returns today for followup. She is a very pleasant 78 year old woman with chronic atrial fibrillation, hypertension, and dyslipidemia. In the interim, she has done well. She denies chest pain, shortness of breath, or peripheral edema. She did have some swelling around her right knee and lower leg after her knee surgery several months ago. This is improved. She denies syncope, near syncope, palpitations, or fevers and chills. Allergies  Allergen Reactions  . Azithromycin     REACTION: pt states INTOL to ZPak  . Pregabalin     REACTION: pt states INTOL to Lyrica  . Sulfonamide Derivatives     REACTION: SWELLING     Current Outpatient Prescriptions  Medication Sig Dispense Refill  . aspirin EC 81 MG EC tablet Take 1 tablet (81 mg total) by mouth daily.      [name] atenolol (TENORMIN) 50 MG tablet Take 50 mg by mouth 2 (two) times daily.      [name] gabapentin (NEURONTIN) 100 MG capsule Take 1 tablet by mouth 3 (three) times daily.       [name] glipiZIDE (GLUCOTROL) 10 MG tablet Take 10 mg by mouth 2 (two) times daily before a meal.      . metFORMIN (GLUCOPHAGE) 500 MG tablet TAKE TWO TABLETS BY MOUTH TWICE DAILY WITH MEALS  120 tablet  11  . Rivaroxaban (XARELTO) 20 MG TABS Take 20 mg by mouth daily.  30 tablet  11  . simvastatin (ZOCOR) 40 MG tablet Take 40 mg by mouth at bedtime.        . Tamsulosin HCl (FLOMAX) 0.4 MG CAPS Take 1 capsule (0.4 mg total) by mouth at bedtime.  30 capsule  0  . vitamin B-12 (CYANOCOBALAMIN) 1000 MCG tablet Take 1,000 mcg by mouth daily.           Past Medical History  Diagnosis Date  . HTN (hypertension)   . Chronic systolic heart failure   . Pure hypercholesterolemia   . Type II or unspecified type diabetes mellitus without mention of complication, not stated as uncontrolled   . Anxiety state, unspecified   . Diaphragmatic hernia without mention of obstruction or gangrene   . Benign neoplasm of colon   . Mixed incontinence urge and stress  (female)(female)   . Osteoarthrosis, unspecified whether generalized or localized, unspecified site   . Lumbago   . Pain in joint, shoulder region   . Osteoporosis, unspecified   . Memory loss   . Renal infarction     01/2011 in setting of low INR  . History of stroke   . AF (atrial fibrillation)   . Dyslipidemia     ROS:   All systems reviewed and negative except as noted in the HPI.   Past Surgical History  Procedure Date  . Cholecystectomy   . Rt.leg surgery 11/2010     Family History  Problem Relation Age of Onset  . Stomach cancer Father   . Pneumonia Mother      History   Social History  . Marital Status: Widowed    Spouse Name: N/A    Number of Children: N/A  . Years of Education: N/A   Occupational History  . Not on file.   Social History Main Topics  . Smoking status: Never Smoker   . Smokeless tobacco: Never Used  . Alcohol Use: No  . Drug Use: No  . Sexually Active: No   Other Topics Concern  . Not on file   Social History Narrative  1 sister--in poor health1 brother hx of kidney stones and heart problems     BP 145/85  Pulse 75  Ht 5\' 5"  (1.651 m)  Wt 129 lb 1.9 oz (58.568 kg)  BMI 21.49 kg/m2  Physical Exam:  Well appearing Elderly woman,  NAD HEENT: Unremarkable Neck:  No JVD, no thyromegally Lungs:  Clear with no wheezes, rales, or rhonchi.  HEART:  Regular rate rhythm, no murmurs, no rubs, no clicks Abd:  soft, positive bowel sounds, no organomegally, no rebound, no guarding Ext:  2 plus pulses, no edema, no cyanosis, no clubbing Skin:  No rashes no nodules Neuro:  CN II through XII intact, motor grossly intact  Assess/Plan:

## 2011-05-05 NOTE — Assessment & Plan Note (Signed)
Her symptoms appear to be well controlled and her rate is well controlled. She will continue her current medications.

## 2011-05-05 NOTE — Patient Instructions (Signed)
Your physician wants you to follow-up in: 12 months with Dr. Taylor. You will receive a reminder letter in the mail two months in advance. If you don't receive a letter, please call our office to schedule the follow-up appointment.    

## 2011-05-05 NOTE — Assessment & Plan Note (Signed)
Her blood pressure is minimally elevated today. She will continue her current medications and maintain a low-sodium diet.

## 2011-05-10 ENCOUNTER — Ambulatory Visit: Payer: Medicare Other | Admitting: Endocrinology

## 2011-05-12 ENCOUNTER — Ambulatory Visit: Payer: Medicare Other | Admitting: Endocrinology

## 2011-05-18 ENCOUNTER — Encounter: Payer: Self-pay | Admitting: Endocrinology

## 2011-05-18 ENCOUNTER — Ambulatory Visit (INDEPENDENT_AMBULATORY_CARE_PROVIDER_SITE_OTHER): Payer: Medicare Other | Admitting: Endocrinology

## 2011-05-18 VITALS — BP 122/88 | HR 86 | Temp 97.5°F | Ht 65.0 in | Wt 132.8 lb

## 2011-05-18 DIAGNOSIS — E1142 Type 2 diabetes mellitus with diabetic polyneuropathy: Secondary | ICD-10-CM

## 2011-05-18 DIAGNOSIS — E1149 Type 2 diabetes mellitus with other diabetic neurological complication: Secondary | ICD-10-CM

## 2011-05-18 MED ORDER — PIOGLITAZONE HCL 45 MG PO TABS: 45.0000 mg | ORAL_TABLET | Freq: Every day | ORAL | Status: DC

## 2011-05-18 MED ORDER — GLUCOSE BLOOD VI STRP: 1.0000 | ORAL_STRIP | Freq: Every day | Status: DC

## 2011-05-18 NOTE — Patient Instructions (Addendum)
good diet and exercise habits significanly improve the control of your diabetes.  please let me know if you wish to be referred to a dietician.  high blood sugar is very risky to your health.  you should see an eye doctor every year. controlling your blood pressure and cholesterol drastically reduces the damage diabetes does to your body.  this also applies to quitting smoking.  please discuss these with your doctor.  you should take an aspirin every day, unless you have been advised by a doctor not to. check your blood sugar once a day.  vary the time of day when you check, between before the 3 meals, and at bedtime.  also check if you have symptoms of your blood sugar being too high or too low.  please keep a record of the readings and bring it to your next appointment here.  please call us sooner if your blood sugar goes below 70, or if it stays over 200.  Here is a new meter.  i have sent a prescription to your pharmacy, for strips.  i have also sent a prescription to your pharmacy, to add-on actos.  This is medication takes months to have its full effect.  Come back sooner if your legs swell, as this is a side-effect.  Please come back for a follow-up appointment for 1 month.

## 2011-05-18 NOTE — Progress Notes (Signed)
Subjective:    Patient ID: Michelle Herrera, female    DOB: 1930-10-02, 78 y.o.   MRN: [id number]  HPI pt states 8 years h/o dm. it is complicated by peripheral sensory neuropathy and CVA.  he has never been on insulin.  pt says his diet is good and exercise is limited by med probs.   She has few years of moderate pain of the legs and feet (R>L), an assoc numbness.  Past Medical History  Diagnosis Date  . HTN (hypertension)   . Chronic systolic heart failure   . Pure hypercholesterolemia   . Type II or unspecified type diabetes mellitus without mention of complication, not stated as uncontrolled   . Anxiety state, unspecified   . Diaphragmatic hernia without mention of obstruction or gangrene   . Benign neoplasm of colon   . Mixed incontinence urge and stress (female)(female)   . Osteoarthrosis, unspecified whether generalized or localized, unspecified site   . Lumbago   . Pain in joint, shoulder region   . Osteoporosis, unspecified   . Memory loss   . Renal infarction     01/2011 in setting of low INR  . History of stroke   . AF (atrial fibrillation)   . Dyslipidemia     Past Surgical History  Procedure Date  . Cholecystectomy   . Rt.leg surgery 11/2010    History   Social History  . Marital Status: Widowed    Spouse Name: N/A    Number of Children: N/A  . Years of Education: N/A   Occupational History  . Not on file.   Social History Main Topics  . Smoking status: Never Smoker   . Smokeless tobacco: Never Used  . Alcohol Use: No  . Drug Use: No  . Sexually Active: No   Other Topics Concern  . Not on file   Social History Narrative   1 sister--in poor health1 brother hx of kidney stones and heart problems    Current Outpatient Prescriptions on File Prior to Visit  Medication Sig Dispense Refill  . aspirin EC 81 MG EC tablet Take 1 tablet (81 mg total) by mouth daily.      [name] atenolol (TENORMIN) 50 MG tablet Take 50 mg by mouth 2 (two) times daily.      [name]  gabapentin (NEURONTIN) 100 MG capsule Take 1 tablet by mouth 3 (three) times daily.       [name] glipiZIDE (GLUCOTROL) 10 MG tablet Take 10 mg by mouth 2 (two) times daily before a meal.      . metFORMIN (GLUCOPHAGE) 500 MG tablet TAKE TWO TABLETS BY MOUTH TWICE DAILY WITH MEALS  120 tablet  11  . Rivaroxaban (XARELTO) 20 MG TABS Take 20 mg by mouth daily.  30 tablet  11  . simvastatin (ZOCOR) 40 MG tablet Take 40 mg by mouth at bedtime.        . Tamsulosin HCl (FLOMAX) 0.4 MG CAPS Take 1 capsule (0.4 mg total) by mouth at bedtime.  30 capsule  0  . vitamin B-12 (CYANOCOBALAMIN) 1000 MCG tablet Take 1,000 mcg by mouth daily.          Allergies  Allergen Reactions  . Azithromycin     REACTION: pt states INTOL to ZPak  . Pregabalin     REACTION: pt states INTOL to Lyrica  . Sulfonamide Derivatives     REACTION: SWELLING    Family History  Problem Relation Age of Onset  . Stomach cancer Father   .  Pneumonia Mother   DM: 2 sibs  BP 122/88  Pulse 86  Temp(Src) 97.5 F (36.4 C) (Oral)  Ht 5\' 5"  (1.651 m)  Wt 132 lb 12.8 oz (60.238 kg)  BMI 22.10 kg/m2  SpO2 95%  Review of Systems denies weight loss, blurry vision, headache, chest pain, sob, n/v, cramps, excessive diaphoresis, depression, hypoglycemia, and easy bruising.  She has urinary incontinence, rhinorrhea,  and memory loss.    Objective:   Physical Exam VS: see vs page GEN: no distress HEAD: head: no deformity eyes: no periorbital swelling, no proptosis external nose and ears are normal mouth: no lesion seen NECK: supple, thyroid is not enlarged CHEST WALL: no deformity LUNGS:  Clear to auscultation CV: reg rate and rhythm, no murmur ABD: abdomen is soft, nontender.  no hepatosplenomegaly.  not distended.  no hernia. MUSCULOSKELETAL: muscle bulk and strength are grossly normal.  no obvious joint swelling.  gait is normal and steady EXTEMITIES: no deformity.  no ulcer on the feet.  feet are of normal color, but cool to  touch.  no edema PULSES: dorsalis pedis intact bilat.  no carotid bruit NEURO:  cn 2-12 grossly intact.   readily moves all 4's.  sensation is intact to touch on the feet, but decreased from normal.   SKIN:  Normal texture and temperature.  No rash or suspicious lesion is visible.   NODES:  None palpable at the neck.   PSYCH: alert, oriented x3.  Does not appear anxious nor depressed.  Lab Results  Component Value Date   HGBA1C 8.6* 04/19/2011      Assessment & Plan:  Type 2 DM.  needs increased rx Memory loss.  This complicates the rx of DM H/o cva.  This causes high risk from any hypoglycemia. Leg pain, prob neuropathic.

## 2011-05-31 ENCOUNTER — Telehealth: Payer: Self-pay | Admitting: Pulmonary Disease

## 2011-05-31 MED ORDER — SOLIFENACIN SUCCINATE 5 MG PO TABS: 10.0000 mg | ORAL_TABLET | Freq: Every day | ORAL | Status: DC

## 2011-05-31 MED ORDER — CIPROFLOXACIN HCL 250 MG PO TABS: 250.0000 mg | ORAL_TABLET | Freq: Two times a day (BID) | ORAL | Status: AC

## 2011-05-31 NOTE — Telephone Encounter (Signed)
Called spoke with patient, advised of SN's recs re the cipro and vesicare.  Pt okay with these recs and verbalized her understanding.  appt with SN scheduled for 6.21.13 @ 11am.  Appointment card has been mailed to pt's verified home address.  Scripts sent to verified pharmacy.  Nothing further needed, will sign off.

## 2011-05-31 NOTE — Telephone Encounter (Signed)
Called spoke with patient who reports an increase in urination, frequency/urgency and occasionally "a little burning" x3-4 weeks.  Denies low back pain or actual discomfort when urinating.  Pt is currently taking flomax 0.4mg  prescribed in 4.23.13.  If SN would like a urine sample, pt is able to come today (will be playing Bingo tomorrow in [place]).  [name]. Allergies  Allergen Reactions  . Azithromycin     REACTION: pt states INTOL to ZPak  . Pregabalin     REACTION: pt states INTOL to Lyrica  . Sulfonamide Derivatives     REACTION: SWELLING    Dr Kriste Basque please advise, thanks.

## 2011-05-31 NOTE — Telephone Encounter (Signed)
Per SN--ok to call in cipro 250mg   #14   1 po bid and vesicare 5 mg   1 daily  #30 with 2 refills.  Will need rov with SN in 1 month for recheck.  thanks

## 2011-06-04 ENCOUNTER — Other Ambulatory Visit: Payer: Self-pay | Admitting: Pulmonary Disease

## 2011-06-21 ENCOUNTER — Telehealth: Payer: Self-pay | Admitting: Pulmonary Disease

## 2011-06-21 ENCOUNTER — Telehealth: Payer: Self-pay | Admitting: Endocrinology

## 2011-06-21 NOTE — Telephone Encounter (Signed)
Pt informed of MD's advisement, pt states that she has stopped taking Actos. Appointment moved up to tomorrow per Dr. Everardo All on 06/22/2011 at 10:30am.

## 2011-06-21 NOTE — Telephone Encounter (Signed)
please call patient: Stop actos Please offer ov add-on tomorrow

## 2011-06-21 NOTE — Telephone Encounter (Signed)
Caller: Adalyna/Patient; PCP: Romero Belling; CB#: [phone]; Call regarding on Pioglitazone x 30 days; itching in face, and neck; swelling in legs and feet.  Onset swelling "right after I started taking the pill" and "they keep getting fatter and fatter."   Has not been evaluated for same.  Advised see provider in 72 hours per nursing judgment and Edema, Atraumatic protocol.  Appt. on 06/23/11 @ 1100 w Dr. Everardo All.

## 2011-06-21 NOTE — Telephone Encounter (Signed)
Per SN-Stop Actos; no salt, and elevate legs, support hose, and okay to give Atarax 25mg  #50 1 po q4hours prn itching no refills.    Spoke with patient-in the meantime of speaking with our office she spoke with Dr George Hugh office-OV set up with Dr Everardo All Wednesday morning. Pt will get recs from him; did not want rx sent.

## 2011-06-21 NOTE — Telephone Encounter (Signed)
Spoke with pt. She states that since Dr. Everardo All started her on actos, she has been having swelling in her feet and ankles. She also c/o HA and itching "all over". I advised that she needs to contact Dr. Everardo All for recs since he started her on med. She states that she prefers recs from SN since "He is my doctor". SN, please advise, thanks

## 2011-06-22 ENCOUNTER — Ambulatory Visit: Payer: Medicare Other | Admitting: Endocrinology

## 2011-06-23 ENCOUNTER — Ambulatory Visit: Payer: Medicare Other | Admitting: Endocrinology

## 2011-06-24 ENCOUNTER — Ambulatory Visit (INDEPENDENT_AMBULATORY_CARE_PROVIDER_SITE_OTHER): Payer: Medicare Other | Admitting: Endocrinology

## 2011-06-24 ENCOUNTER — Encounter: Payer: Self-pay | Admitting: Endocrinology

## 2011-06-24 VITALS — BP 148/90 | HR 79 | Temp 98.0°F | Ht 65.0 in | Wt 139.0 lb

## 2011-06-24 DIAGNOSIS — E1149 Type 2 diabetes mellitus with other diabetic neurological complication: Secondary | ICD-10-CM

## 2011-06-24 DIAGNOSIS — E1142 Type 2 diabetes mellitus with diabetic polyneuropathy: Secondary | ICD-10-CM

## 2011-06-24 NOTE — Patient Instructions (Addendum)
check your blood sugar once a day.  vary the time of day when you check, between before the 3 meals, and at bedtime.  also check if you have symptoms of your blood sugar being too high or too low.  please keep a record of the readings and bring it to your next appointment here.  please call us sooner if your blood sugar goes below 70, or if it stays over 200.     Stop actos. Please follow-up your diabetes with dr Kriste Basque. i would be happy to see you back here whenever you want.

## 2011-06-24 NOTE — Progress Notes (Signed)
Subjective:    Patient ID: Michelle Herrera, female    DOB: 02/01/1930, 78 y.o.   MRN: [id number]  HPI pt returns for f/u of type 2 DM (dx'ed 2005; complicated by peripheral sensory neuropathy and CVA). She has 1 week of moderate swelling of the legs, but no assoc sob.  She is not willing to take any other medication instead of the actos Past Medical History  Diagnosis Date  . HTN (hypertension)   . Chronic systolic heart failure   . Pure hypercholesterolemia   . Type II or unspecified type diabetes mellitus without mention of complication, not stated as uncontrolled   . Anxiety state, unspecified   . Diaphragmatic hernia without mention of obstruction or gangrene   . Benign neoplasm of colon   . Mixed incontinence urge and stress (female)(female)   . Osteoarthrosis, unspecified whether generalized or localized, unspecified site   . Lumbago   . Pain in joint, shoulder region   . Osteoporosis, unspecified   . Memory loss   . Renal infarction     01/2011 in setting of low INR  . History of stroke   . AF (atrial fibrillation)   . Dyslipidemia     Past Surgical History  Procedure Date  . Cholecystectomy   . Rt.leg surgery 11/2010    History   Social History  . Marital Status: Widowed    Spouse Name: N/A    Number of Children: N/A  . Years of Education: N/A   Occupational History  . Not on file.   Social History Main Topics  . Smoking status: Never Smoker   . Smokeless tobacco: Never Used  . Alcohol Use: No  . Drug Use: No  . Sexually Active: No   Other Topics Concern  . Not on file   Social History Narrative   1 sister--in poor health1 brother hx of kidney stones and heart problems    Current Outpatient Prescriptions on File Prior to Visit  Medication Sig Dispense Refill  . aspirin EC 81 MG EC tablet Take 1 tablet (81 mg total) by mouth daily.      [name] atenolol (TENORMIN) 50 MG tablet Take 50 mg by mouth 2 (two) times daily.      [name] gabapentin (NEURONTIN) 100 MG  capsule Take 1 tablet by mouth 3 (three) times daily.       [name] glipiZIDE (GLUCOTROL) 10 MG tablet Take 10 mg by mouth 2 (two) times daily before a meal.      . glucose blood (ONE TOUCH ULTRA TEST) test strip 1 each by Other route daily. And lancets 1/day 250.02  100 each  12  . metFORMIN (GLUCOPHAGE) 500 MG tablet TAKE TWO TABLETS BY MOUTH TWICE DAILY WITH MEALS  120 tablet  11  . Rivaroxaban (XARELTO) 20 MG TABS Take 20 mg by mouth daily.  30 tablet  11  . simvastatin (ZOCOR) 40 MG tablet Take 40 mg by mouth at bedtime.        . solifenacin (VESICARE) 5 MG tablet Take 2 tablets (10 mg total) by mouth daily.  30 tablet  2  . Tamsulosin HCl (FLOMAX) 0.4 MG CAPS TAKE ONE CAPSULE BY MOUTH AT BEDTIME  30 capsule  6  . vitamin B-12 (CYANOCOBALAMIN) 1000 MCG tablet Take 1,000 mcg by mouth daily.          Allergies  Allergen Reactions  . Azithromycin     REACTION: pt states INTOL to ZPak  . Pioglitazone  edema  . Pregabalin     REACTION: pt states INTOL to Lyrica  . Sulfonamide Derivatives     REACTION: SWELLING    Family History  Problem Relation Age of Onset  . Stomach cancer Father   . Pneumonia Mother     BP 148/90  Pulse 79  Temp 98 F (36.7 C) (Oral)  Ht 5\' 5"  (1.651 m)  Wt 139 lb (63.05 kg)  BMI 23.13 kg/m2  SpO2 97%   Review of Systems denies hypoglycemia and weight change    Objective:   Physical Exam VITAL SIGNS:  See vs page GENERAL: no distress Ext: 1+ bilat ankle edema.  Lab Results  Component Value Date   HGBA1C 8.6* 04/19/2011      Assessment & Plan:  Edema due to actos, new DM, needs increased rx.  However, she is unwilling to take additional rx. HTN, with possible situational component.

## 2011-06-27 ENCOUNTER — Telehealth: Payer: Self-pay | Admitting: Pulmonary Disease

## 2011-06-27 NOTE — Telephone Encounter (Signed)
Called and spoke with pt and she stated that the actos was  Causing her to have some side effects from this med---mouth dry, feels like it has bumps all over her mouth, her feet are so swollen---has been elevating her feet but she stated that this makes it worse.  Denies any increase of SOB or chest pain.  She has been off of the actos x 1 week but symptoms are not any better.  SN please advise. Thanks  Allergies  Allergen Reactions  . Azithromycin     REACTION: pt states INTOL to ZPak  . Pioglitazone     edema  . Pregabalin     REACTION: pt states INTOL to Lyrica  . Sulfonamide Derivatives     REACTION: SWELLING

## 2011-06-27 NOTE — Telephone Encounter (Signed)
I spoke with pt and is aware of SN recs. He voiced his understanding and had no questions 

## 2011-06-27 NOTE — Telephone Encounter (Signed)
Per SN---no salt in her diet, elevate her feet, wear support hose while up and give it more time off the actos.  thanks

## 2011-07-01 ENCOUNTER — Ambulatory Visit: Payer: Medicare Other | Admitting: Pulmonary Disease

## 2011-07-06 ENCOUNTER — Ambulatory Visit: Payer: Medicare Other | Admitting: Endocrinology

## 2011-07-06 ENCOUNTER — Other Ambulatory Visit: Payer: Self-pay | Admitting: *Deleted

## 2011-07-06 MED ORDER — SOLIFENACIN SUCCINATE 5 MG PO TABS: 5.0000 mg | ORAL_TABLET | Freq: Every day | ORAL | Status: DC

## 2011-07-18 ENCOUNTER — Telehealth: Payer: Self-pay | Admitting: Pulmonary Disease

## 2011-07-18 NOTE — Telephone Encounter (Signed)
ATC PT NA unable to leave VM WCB 

## 2011-07-18 NOTE — Telephone Encounter (Signed)
I spoke with pt and she states she started taking the vesicar on wed and since she has noticed bilateral swelling in her legs, feet and ankles, her throat feels scratchy and she has been having headaches. She states she can't wear the support hose on the right leg due to her neuropathy bc it hurts to bad. She has been keeping her legs elevated when she can. Pt is requesting further recs from SN please advise thanks  Allergies  Allergen Reactions  . Azithromycin     REACTION: pt states INTOL to ZPak  . Pioglitazone     edema  . Pregabalin     REACTION: pt states INTOL to Lyrica  . Sulfonamide Derivatives     REACTION: SWELLING

## 2011-07-18 NOTE — Telephone Encounter (Signed)
Per SN---ok to stop these meds.  No salt in her diet---and keep her legs elevated this week to see if this helps.  Have her keep her appt with SN on 8-9 but call us back next week if she is not better with the swelling.  thanks

## 2011-07-19 NOTE — Telephone Encounter (Signed)
I spoke with pt and is aware of SN recs. She voiced her understanding and needed nothing further 

## 2011-07-19 NOTE — Telephone Encounter (Signed)
Pt returned our call.  Please call her back @ [phone] Michelle Herrera

## 2011-08-10 ENCOUNTER — Telehealth: Payer: Self-pay | Admitting: Pulmonary Disease

## 2011-08-10 ENCOUNTER — Other Ambulatory Visit: Payer: Self-pay | Admitting: Pulmonary Disease

## 2011-08-10 MED ORDER — ONETOUCH ULTRA SYSTEM W/DEVICE KIT: PACK | Status: DC

## 2011-08-10 NOTE — Telephone Encounter (Signed)
According to Dr. Everardo All last note pt to f/u w/ SN regarding her diabetes. So rx has been sent for this and pt aware. Nothing further was needed

## 2011-08-11 ENCOUNTER — Other Ambulatory Visit: Payer: Self-pay | Admitting: *Deleted

## 2011-08-11 MED ORDER — ONETOUCH ULTRA SYSTEM W/DEVICE KIT: PACK | Status: DC

## 2011-08-11 MED ORDER — GLUCOSE BLOOD VI STRP: 1.0000 | ORAL_STRIP | Freq: Every day | Status: DC

## 2011-08-11 MED ORDER — ONETOUCH ULTRASOFT LANCETS MISC: Status: DC

## 2011-08-19 ENCOUNTER — Encounter: Payer: Self-pay | Admitting: Pulmonary Disease

## 2011-08-19 ENCOUNTER — Ambulatory Visit (INDEPENDENT_AMBULATORY_CARE_PROVIDER_SITE_OTHER): Payer: Medicare Other | Admitting: Pulmonary Disease

## 2011-08-19 ENCOUNTER — Other Ambulatory Visit (INDEPENDENT_AMBULATORY_CARE_PROVIDER_SITE_OTHER): Payer: Medicare Other

## 2011-08-19 VITALS — BP 110/82 | HR 107 | Temp 97.9°F | Ht 65.0 in | Wt 131.0 lb

## 2011-08-19 DIAGNOSIS — I4891 Unspecified atrial fibrillation: Secondary | ICD-10-CM

## 2011-08-19 DIAGNOSIS — M810 Age-related osteoporosis without current pathological fracture: Secondary | ICD-10-CM

## 2011-08-19 DIAGNOSIS — E7800 Pure hypercholesterolemia, unspecified: Secondary | ICD-10-CM

## 2011-08-19 DIAGNOSIS — R413 Other amnesia: Secondary | ICD-10-CM

## 2011-08-19 DIAGNOSIS — E1149 Type 2 diabetes mellitus with other diabetic neurological complication: Secondary | ICD-10-CM

## 2011-08-19 DIAGNOSIS — M1990 Unspecified osteoarthritis, unspecified site: Secondary | ICD-10-CM

## 2011-08-19 DIAGNOSIS — F411 Generalized anxiety disorder: Secondary | ICD-10-CM

## 2011-08-19 DIAGNOSIS — K449 Diaphragmatic hernia without obstruction or gangrene: Secondary | ICD-10-CM

## 2011-08-19 DIAGNOSIS — D126 Benign neoplasm of colon, unspecified: Secondary | ICD-10-CM

## 2011-08-19 DIAGNOSIS — E538 Deficiency of other specified B group vitamins: Secondary | ICD-10-CM

## 2011-08-19 DIAGNOSIS — E1142 Type 2 diabetes mellitus with diabetic polyneuropathy: Secondary | ICD-10-CM

## 2011-08-19 DIAGNOSIS — I10 Essential (primary) hypertension: Secondary | ICD-10-CM

## 2011-08-19 DIAGNOSIS — M545 Low back pain: Secondary | ICD-10-CM

## 2011-08-19 DIAGNOSIS — N3946 Mixed incontinence: Secondary | ICD-10-CM

## 2011-08-19 LAB — BASIC METABOLIC PANEL
BUN: 12 mg/dL (ref 6–23)
CO2: 30 mEq/L (ref 19–32)
Chloride: 99 mEq/L (ref 96–112)
Creatinine, Ser: 0.8 mg/dL (ref 0.4–1.2)
Glucose, Bld: 163 mg/dL — ABNORMAL HIGH (ref 70–99)
Potassium: 4.7 mEq/L (ref 3.5–5.1)

## 2011-08-19 LAB — HEMOGLOBIN A1C: Hgb A1c MFr Bld: 7.5 % — ABNORMAL HIGH (ref 4.6–6.5)

## 2011-08-19 NOTE — Progress Notes (Signed)
Subjective:    Patient ID: Michelle Herrera, female    DOB: 08-01-1930, 78 y.o.   MRN: [id number]  HPI 78 y/o WF here for a follow up visit... she has mult medical problems including:    ~  October 13, 2009:  she saw DrNorris for Ortho w/ back & shoulder XRays & rec for PT & shot in shoulder (min help, refuses PT)... notes incr BS w/ shoulder injection & under incr stress (son dx w/ throat cancer)... now c/o pain in left chest wall w/ tender 11th & 12th ribs & costal margin- intol Tramadol, just using Tylenol & we discussed rest/ heat/ rib binder & try Flector patch... she saw DrTaylor for f/u AFib 8/11- stable rate control strategy, no changes made... f/u DM labs today showed BS=322, A1c=8.3 & call to Pharm confirms not taking Metformin or Glipizide regularly!  ~  April 14, 2010:  47mo ROV & she reports [facility] 11/11 by Neuro for "light stroke" w/ full recovery (SEE BELOW) & continued on her Coumadin by DrWillis & no other changes made; requests refill prescriptions today, & we will check non-fasting blood work...  ~  August 16, 2010:  65mo ROV & she reports stable overall> BP controlled & she denies CP, palpit, dizzy, etc; Fasting labs today showed FLP looks good on Simva40, & A1c improved to 7.3 on her ...     She had Urology f/u by [facility] 4/12> for eval urinary incont, prev eval showed sm capacity unstable bladder, tried Detrol w/ some improvement; recently tried Enablex w/o much improvement; she states "I've got an itty-bitty bladder & there's not much he can do"...    She has been followed by DrNorris for Ortho> 3/12 note reviewed, right knee arthritis w/ persist pain s/p prev arthroscopies; she has received cortisone shots & recent series of ?synvisc, but it looks like she may need to consider TKR- she wants to hold off as long as possible...  ~  December 17, 2010:  65mo ROV & she reports a good interval- had a nice Thanksgiving & planning a great Christmas;  No change in meds> BP controlled  on Aten & she denies CP, palpit, dizzy, syncope, SOB, edema, etc;  Hx PAF & she continues on Coumadin followed by DrTaylor w/ rate control strategy;  FLP remains good on Simva40- tol well;  DM control improved on Metform/ Glipiz/ Diet;  She is c/o persist urinary symptoms w/ burning & leaking- followed by DrKimbrough et al;  She has DJD, LBP, Shoulder pain- treated by DrRendall/ DrNorris w/ knee surg 9/12 as outpt for torn cartilage (sl better post-op but requiring Percocet/ Tramadol)...  ~  April 19, 2011:  65mo ROV & post hosp visit> Adm 1/13 by [facility] w/ flank pain and found to have a renal infarct (& sub therapeutic INR); CT Abd revealed abn perfusion to the cortex of both kidneys raising the question of infarcts; UA was neg & cult- no growth; she was seen by VascSurg & no surg needed; her pain was relieved after one shot of MS; she has followed up w/ Urology DrEskridge- hx incontinence & small capacity bladder, notes reviewed, tried Myrbetriq w/o much benenfit she says...    Sugars were not well controlled in the hosp w/ mBS~ 153-209 & A1c=7.4 ==> A1c is now 8.6 & we will refer to Endocrinology...    She saw Cards 1/13 for f/u AFib, HBP, CHF> she was switched from Coumadin to XARELTO (due to the renal infarcts & difficulty  maintaining INR... LABS 4/13:  FLP- fair on Simva40 w/ LDL 112;  Chems= ok x BS=208, A1c=8.6;  CBC- ok;  TSH=3.89;  VitD= 30;  VitB12=905  ~  August 19, 2011:  72mo ROV & [name] says she was stung on her right hand by a "brown wasper"; she has been using cool compresses and Benedryl & is improved, she says;  No other complaints or concerns...     She is followed by DrTaylor for Cards> AFib, valv heart dis, HBP; on Atenolol 50mg Bid & Xarelto 20mg /d; she has occas sharp CP, palpit, SOB, but overall stable...    She is followed by DrEllison w/ DM on Metform500-2Bid & Glucotrol10Bid; last seen 6/13 & pt stopped Actos due to edema; Labs showed BS=163, A1c=7.5 which is sl improved from previous;  rec same meds, better diet... We reviewed prob list, meds, xrays and labs> see below>> LABS 8/13:  Chems- ok x BS=163 A1c=7.5           Problem List:     HYPERTENSION - on ATENOLOL 50mg  Bid... VALVULAR HEART DISEASE   ATRIAL FIBRILLATION - stable on Rx w/ XARELTO 20mg /d & rate control strategy. ~  4/11:  she reports that she stopped her Lanoxin 0.125 on her own & doesn't want to restart. ~  8/11:  f/u DrTaylor & stable on Coumadin w/ rate control strategy, no changes made. ~  11/11:  Hosp by Neuro w/ TIA> 2DEcho showed mild LVH, diffuse HK w/ EF= 45%, thick pos MV leaflet w/ restrict motion & modMR, dilated LA&RA, severeTR & PAsys=49, can't r/o PFO... ~  8/12:  her BP=116/84 and she feels OK- denies HA, fatigue, visual changes, CP, palipit, dizziness, syncope, dyspnea, edema, etc... ~  12/12:  BP= 126/88 and she remains asymptomatic as above... ~  1/13:  2DEcho showed mild LVH w/ EF= 40-45% & septal HK, Diastolic dysfunction, modMR, severe LAE, modTR, Pasys=45 ~  She was switched from Coumadin to [facility] 1/13 due to the renal infarcts & difficulty maintaining INR. ~  CXR 1/13 showed marked cardiomegaly, pulm vasc congestion, NAD... ~  4/13:  BP= 128/60 and she is feeling better she says;  BUN=15, Creat=0.8 ~  8/13:  BP= 110/82 and she denies current CP, palpit, SOB, edema...  HYPERCHOLESTEROLEMIA - on diet + SIMVASTATIN 40mg /d... ~  FLP 2007 showed TChol 160, TG 143, HDL 55, LDL 76 ~  FLP 7/09 showed TChol 165, TG 129, HDL 60, LDL 80 ~  FLP 10/10 showed TChol 175, TG 68, HDL 80, LDL 82 ~  FLP 4/11 showed TChol 207, TG 104, HDL 84, LDL 111... rec better diet, continue same med. ~  FLP 11/11 on Simva40 showed TChol 180, TG 71, HDL 67, LDL 99 ~  FLP 8/12 on Simva40 showed TChol 196, TG 96, HDL 89, LDL 88 ~  FLP 4/13 on Simva40 showed TChol 211, TG 77, HDL 85, LDL 112... rec better diet, same med.  DM - on METFORMIN 500mg - 2tabsBid, GLIPZIDE 10mg Bid (not taking Januvia due to $$)... ~   labs 1/09 showed BS= 148, HgA1c= 7.1.Marland KitchenMarland Kitchen continue meds + better diet... ~  labs 7/09 showed BS= 168, HgA1c= 7.6.Marland KitchenMarland Kitchen incr Metform 2Bid, contin Glip 10Bid & Januv... ~  she stopped the Januvia due to cost... ~  labs 4/10 showed BS= 185, Aic= 8.0.Marland KitchenMarland Kitchen reminder to take meds regularly. ~  labs 10/10 on Metform2Bid+Glip10Bid showed BS= 124, A1c= 6.7 ~  labs 4/11 showed BS= 162, A1c= 6.8.[name]. rec> better diet, take meds regularly. ~  labs 10/11 showed BS= 322, A1c= 8.3.Marland KitchenMarland Kitchen Pharm confirms not taking meds regularly!!! Discussed w/ pt... ~  Labs 4/12 showed BS= 91, A1c= 7.9.Marland KitchenMarland Kitchen Rec> keep same meds, take regularly, better low carb diet... ~  Labs 8/12 on Metform2Bid+Glip10Bid showed BS= 114, A1c= 7.3.Marland KitchenMarland Kitchen Continue same... ~  Labs in hosp 1/13 showed BS~ 153-209 & A1c=7.4... NOTE: she has refused other meds due to $$$ ~  Labs 4/13 on Metform2Bid+Glipiz10Bid showed BS= 208, A1c= 8.6... She is referred to Endocrinology ~  She saw DrEllison but refused Actos, refused Gliptins, etc... ~  Labs 8/13 on Metform2Bid+Glipiz10Bid showed BS= 163, A1c= 7.5.Marland KitchenMarland Kitchen Continue same + better diet...  HIATAL HERNIA (ICD-553.3) - last EGD by DrStark was 3/03 and showed a HH, reflux...  COLONIC POLYPS (ICD-211.3) - last colonoscopy 3/03 by DrStark showed divertics and 5mm polyp (no path avail).  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 pt reports that there is nothing they can do to help... ~  she has had several UTI's... then perineal rash & saw GYN= neg PAP & Rx yeast infection- resolved. ~  She will f/u w/ Urology for persistant symptoms, seen by DrEskridge w/ trial Myrbetriq but not much benefit per pt...  DEGENERATIVE JOINT DISEASE (ICD-715.90) - she needs right TKR but wants to put this off as long as poss... ~  4/12:  Ortho eval by DrNorris, hx prev knee arthroscopies, given Cortisone shot, & he plans ?Synvisc series... ~  9/12:   outpt knee surg to repair torn cartilage per DrNorris; preop clearance by DrTaylor & [facility]...  BACK PAIN, LUMBAR (ICD-724.2) - XRays showed scoliosis, facet degen arthritis, & osteopenia...  she has used ROBAXIN Prn... ~  4/12:  She reports eval from DrNorris w/ "arthritis all down my backbone" & he rec epid steroid injection, but she reports improved on NEURONTIN 100mg  Tid... ~  4/13:  She reports that back pain has improved but she still has some leg pain but notes that "it's tolerable"...  SHOULDER PAIN, RIGHT (ICD-719.41) - prev eval & Rx by DrRendall... s/p right shoulder surg 3/09... XRays showed calcium deposit, rotator cuff prob, & intol to Tramadol...  OSTEOPOROSIS (ICD-733.00) ~  labs 10/10 w/ Vit D level = 32... rec> start Vit D OTC 1000 u daily. ~  Labs 4/13 showed Vit D level = 30... rec to incr to 2000u daily...  MEMORY LOSS (ICD-780.93) - concern for her memory expressed 7/09 OV- tried Aricept but she stopped it- "no different". TIA/ INFARCT - she was [facility] 11/11 by DrWillis w/ left sided weakness, ?left facial droop, & MRI showed 2 sm infarcts on right (frontal & ?pontine);  MRA was neg;  CDopplers were neg;  Deficits all cleared rapidly;  2DEcho was abn- see above;  She was continued on her Coumadin Rx- no changes made... ~  4/13:  She was offered memory medications but she declined...  ANXIETY (ICD-300.00) - stress in family... 2 y/o granddau w/ seizures...  VITAMIN B12 DEFICIENCY >> on OTC Vit B-12 tabs orally> 1015mcg/d... ~  Labs 7/11 showed Vit V12 level = 135... Started on Vit B12 supplement w/ 1024mcg/d... ~  Labs 4/13 showed Vit B12 level = 905... Ok top decr to 1/2 tab daily...   Past Surgical History  Procedure Date  . Cholecystectomy   . Rt.leg surgery 11/2010    Outpatient Encounter Prescriptions as of 08/19/2011  Medication Sig Dispense Refill  . aspirin EC 81 MG EC  tablet Take 1 tablet (81 mg total) by mouth daily.      [name] atenolol (TENORMIN) 50 MG  tablet Take 50 mg by mouth 2 (two) times daily.      . Blood Glucose Monitoring Suppl (ONE TOUCH ULTRA SYSTEM KIT) W/DEVICE KIT ONE TOUCH ULTRA LIGHT MINI,  DX: 250.00  1 each  0  . gabapentin (NEURONTIN) 100 MG capsule TAKE ONE CAPSULE BY MOUTH THREE TIMES DAILY  90 capsule  4  . glipiZIDE (GLUCOTROL) 10 MG tablet Take 10 mg by mouth 2 (two) times daily before a meal.      . glucose blood (ONE TOUCH ULTRA TEST) test strip 1 each by Other route daily. And lancets 1/day 250.02  100 each  12  . Lancets (ONETOUCH ULTRASOFT) lancets Use as instructed,  DX: 250.00  100 each  12  . metFORMIN (GLUCOPHAGE) 500 MG tablet TAKE TWO TABLETS BY MOUTH TWICE DAILY WITH MEALS  120 tablet  11  . Rivaroxaban (XARELTO) 20 MG TABS Take 20 mg by mouth daily.  30 tablet  11  . vitamin B-12 (CYANOCOBALAMIN) 1000 MCG tablet Take 1,000 mcg by mouth daily.        [name] DISCONTD: gabapentin (NEURONTIN) 100 MG capsule Take 1 tablet by mouth 3 (three) times daily.       [name] DISCONTD: simvastatin (ZOCOR) 40 MG tablet Take 40 mg by mouth at bedtime.        [name] DISCONTD: solifenacin (VESICARE) 5 MG tablet Take 1 tablet (5 mg total) by mouth daily.  30 tablet  1  . DISCONTD: Tamsulosin HCl (FLOMAX) 0.4 MG CAPS TAKE ONE CAPSULE BY MOUTH AT BEDTIME  30 capsule  6     Allergies  Allergen Reactions  . Azithromycin     REACTION: pt states INTOL to ZPak  . Flomax (Tamsulosin Hcl)     swelling  . Pioglitazone     edema  . Pregabalin     REACTION: pt states INTOL to Lyrica  . Sulfonamide Derivatives     REACTION: SWELLING    Review of Systems        See HPI - all other systems neg except as noted... The patient complains of weight loss, chest pain, dyspnea on exertion, and muscle weakness.  The patient denies anorexia, fever, weight gain, vision loss, decreased hearing, hoarseness, syncope, peripheral edema, prolonged cough, headaches, hemoptysis, abdominal pain, melena, hematochezia, severe indigestion/heartburn, hematuria,  incontinence, suspicious skin lesions, transient blindness, difficulty walking, depression, unusual weight change, abnormal bleeding, enlarged lymph nodes, and angioedema.     Objective:   Physical Exam      WD, WN, 78 y/o WF in NAD...  GENERAL:  Alert & oriented; pleasant & cooperative... HEENT:  [place]/AT, EOM-wnl, PERRLA, EACs-clear, TMs-wnl, NOSE-clear, THROAT-clear & wnl. NECK:  Supple w/ fairROM; no JVD; normal carotid impulses w/o bruits; no thyromegaly or nodules palpated; no lymphadenopathy. CHEST:  Clear to P & A; without wheezes/ rales/ or rhonchi; +tender left 11th-12th ribs & costal margin. HEART:  Iregular Rhythm= AFib; without murmurs/ rubs/ or gallops heard... ABDOMEN:  Soft & nontender; normal bowel sounds; no organomegaly or masses detected. EXT: without deformities, mild arthritic changes; no varicose veins/ venous insuffic/ or edema. NEURO:  CN's intact;  no focal neuro deficits found... DERM:  No lesions noted; no rash etc...  RADIOLOGY DATA:  Reviewed in the EPIC EMR & discussed w/ the patient...  LABORATORY DATA:  Reviewed in the EPIC EMR & discussed w/ the patient.Marland KitchenMarland Kitchen  Assessment & Plan:   HBP>  Stable on BBlocker Rx, continue same...  AFIB>  She had abn 2DEcho in [facility] 11/11 & 1/13- reviewed by Cards- she sees DrTaylor et al & switched to XARELTO from Coumadin...  CHOL>  FLP looks fair on the Simva40 + diet rx;  reminded about low fat diet & taking med daily...  DM>  A1c is 7.5 now on same meds- previously control has been difficult due to lack of compliance w/ med rx- we reviewed again the importance of regular dosing for her control issues/ dose adjustments; she would not take any additional meds from DrEllison either...  RENAL INFARCTION>  Adm 1/13 w/ renal infarcts felt due to micro-emboli & inadeq INR ==> changed to XARELTO... URINARY INCONTINENCE>  Followed by Lysbeth Penner now Eskridge & she feels that there is nothing they can do to help her; wears pads;  offered second opinion & she will consider it...  ORTHO>  Followed by DrNorris & she is s/p cortisone shots in right knee & ?Synvisc series; she had arthroscopic surg for torn cartilage 9/12 she says & she doesn't want TKR.  STROKE>  She has had a CVA w/ hosp by Neuro 11/11 [facility] records all reviewed);  She was continued on her Coumadin per DrWillis & followed in [facility];  Stable w/o recurrent cerebral ischemic symptoms...  Other medical problems as noted... She will continue the Calcium, MVI, Vit D, & Vit B12 regimen...   Patient's Medications  New Prescriptions   No medications on file  Previous Medications   ASPIRIN EC 81 MG EC TABLET    Take 1 tablet (81 mg total) by mouth daily.   ATENOLOL (TENORMIN) 50 MG TABLET    Take 50 mg by mouth 2 (two) times daily.   BLOOD GLUCOSE MONITORING SUPPL (ONE TOUCH ULTRA SYSTEM KIT) W/DEVICE KIT    ONE TOUCH ULTRA LIGHT MINI,  DX: 250.00   GABAPENTIN (NEURONTIN) 100 MG CAPSULE    TAKE ONE CAPSULE BY MOUTH THREE TIMES DAILY   GLIPIZIDE (GLUCOTROL) 10 MG TABLET    Take 10 mg by mouth 2 (two) times daily before a meal.   GLUCOSE BLOOD (ONE TOUCH ULTRA TEST) TEST STRIP    1 each by Other route daily. And lancets 1/day 250.02   LANCETS (ONETOUCH ULTRASOFT) LANCETS    Use as instructed,  DX: 250.00   METFORMIN (GLUCOPHAGE) 500 MG TABLET    TAKE TWO TABLETS BY MOUTH TWICE DAILY WITH MEALS   RIVAROXABAN (XARELTO) 20 MG TABS    Take 20 mg by mouth daily.   VITAMIN B-12 (CYANOCOBALAMIN) 1000 MCG TABLET    Take 1,000 mcg by mouth daily.    Modified Medications   No medications on file  Discontinued Medications   GABAPENTIN (NEURONTIN) 100 MG CAPSULE    Take 1 tablet by mouth 3 (three) times daily.    SIMVASTATIN (ZOCOR) 40 MG TABLET    Take 40 mg by mouth at bedtime.     SOLIFENACIN (VESICARE) 5 MG TABLET    Take 1 tablet (5 mg total) by mouth daily.   TAMSULOSIN HCL (FLOMAX) 0.4 MG CAPS    TAKE ONE CAPSULE BY MOUTH AT BEDTIME

## 2011-08-19 NOTE — Patient Instructions (Addendum)
Today we updated your med list in our EPIC system...    Continue your current medications the same...  Today we did your follow up diabetic labs...    We will call you w/ the results when avail...  Let's get on track w/ our low carb diabetic diet & exercise program...  Call for any questions...  Let's plan a follow up visit in 6 months.Marland KitchenMarland Kitchen

## 2011-08-22 NOTE — Progress Notes (Signed)
Quick Note:  Pt aware of results. ______ 

## 2011-09-06 ENCOUNTER — Telehealth: Payer: Self-pay | Admitting: Pulmonary Disease

## 2011-09-06 NOTE — Telephone Encounter (Signed)
Called spoke with patient who stated her xarelto copay has increased to over $100 and she cannot afford this.  Pt stated that her pharmacy had mentioned that perhaps she is in the "donut hole" but pt stated she has not received notification of this from [facility].  Advised pt that her xarelto was last filled by Dr Ladona Ridgel and she should contact his office for recs but pt is requesting SN's recs.  Dr Kriste Basque please advise, thanks!  [employer], spoke with Shanda Bumps who reported that pt's xarelto copay has increased from $30 last month to $92 this month.  Stated no PA is needed and again suggested that perhaps pt is now in the "donut hole."

## 2011-09-06 NOTE — Telephone Encounter (Signed)
LMTCB

## 2011-09-07 ENCOUNTER — Telehealth: Payer: Self-pay | Admitting: Internal Medicine

## 2011-09-07 NOTE — Telephone Encounter (Signed)
Spoke with patient and after discussing with Weston Brass, Pharm D will have pt continue with Xarelto 20mg  daily through provided samples and will ask Dr Ladona Ridgel what he wants to do.  If we stop the Xarelto we are suppose to bridge with Lovenox per Kennon Rounds.  The patient is in the doughnut whole  I have let her know he is out next week and we will provide her with enough samples until he returns

## 2011-09-07 NOTE — Telephone Encounter (Signed)
Please return call to patient [phone] regarding medication issues. Price of Xarelto over $100, please call to advise of change

## 2011-09-07 NOTE — Telephone Encounter (Signed)
Follow-up:    Patient called in concerned that she has not received a call back.  Please call back, patient will be away from home at 1:30pm.

## 2011-09-07 NOTE — Telephone Encounter (Signed)
Patient returning nurse call, she can be reached at [phone].  Pt will be in for the rest of the day.  Please return call ASAP as patient is down to her last 2 pills

## 2011-09-08 ENCOUNTER — Telehealth: Payer: Self-pay | Admitting: *Deleted

## 2011-09-08 NOTE — Telephone Encounter (Signed)
Pt aware. Jennifer Castillo, CMA  

## 2011-09-08 NOTE — Telephone Encounter (Signed)
Pt sent here by primary to see if we had xarelto--she is present pt of dr taylor--20 tabs xarelto 20mg  given to pt

## 2011-09-08 NOTE — Telephone Encounter (Signed)
Per SN-----his rec is to check with cardiology and coumadin clinic for this med.  thanks

## 2011-09-23 ENCOUNTER — Telehealth: Payer: Self-pay | Admitting: Internal Medicine

## 2011-09-23 NOTE — Telephone Encounter (Signed)
Plz return call to patient [phone] regarding xarelto samples

## 2011-09-23 NOTE — Telephone Encounter (Signed)
Spoke with pt, aware  Samples will be placed at the front desk for pick up

## 2011-10-18 ENCOUNTER — Other Ambulatory Visit: Payer: Self-pay | Admitting: Internal Medicine

## 2011-10-18 ENCOUNTER — Telehealth: Payer: Self-pay | Admitting: Internal Medicine

## 2011-10-18 NOTE — Telephone Encounter (Signed)
Samples outfront 

## 2011-10-18 NOTE — Telephone Encounter (Signed)
plz return call to pt [phone] regarding Xarelto  Samples.

## 2011-11-10 ENCOUNTER — Telehealth: Payer: Self-pay | Admitting: Internal Medicine

## 2011-11-10 NOTE — Telephone Encounter (Signed)
New problem:    Samples of Xarelto.

## 2011-11-10 NOTE — Telephone Encounter (Signed)
Will place samples out front 

## 2011-12-09 ENCOUNTER — Other Ambulatory Visit: Payer: Self-pay | Admitting: *Deleted

## 2011-12-09 DIAGNOSIS — I4891 Unspecified atrial fibrillation: Secondary | ICD-10-CM

## 2011-12-09 MED ORDER — RIVAROXABAN 20 MG PO TABS: 20.0000 mg | ORAL_TABLET | Freq: Every day | ORAL | Status: DC

## 2011-12-09 NOTE — Telephone Encounter (Signed)
Patient called for Xarelto 20mg  samples Was given 6 boxes (30 tabs)    Metha Kolasa, CMA

## 2011-12-29 ENCOUNTER — Other Ambulatory Visit: Payer: Self-pay | Admitting: Pulmonary Disease

## 2012-01-09 ENCOUNTER — Telehealth: Payer: Self-pay | Admitting: Internal Medicine

## 2012-01-09 NOTE — Telephone Encounter (Signed)
Pt requesting xeralto samples

## 2012-01-09 NOTE — Telephone Encounter (Signed)
Pt calling back again.

## 2012-01-09 NOTE — Telephone Encounter (Signed)
Samples outfront 

## 2012-01-26 ENCOUNTER — Telehealth: Payer: Self-pay | Admitting: Pulmonary Disease

## 2012-01-26 MED ORDER — METHYLPREDNISOLONE 4 MG PO KIT: PACK | ORAL | Status: DC

## 2012-01-26 MED ORDER — LEVOFLOXACIN 500 MG PO TABS: 500.0000 mg | ORAL_TABLET | Freq: Every day | ORAL | Status: DC

## 2012-01-26 NOTE — Telephone Encounter (Signed)
Last OV 08-2011. Pt is c/o sore throat, dry cough, sinus congestion, pressure and drainage x 5 days. Pt states the cough is worse at night and is keeping her up all night. Pt wants something called in. Please advise. Carron Curie, CMA Allergies  Allergen Reactions  . Azithromycin     REACTION: pt states INTOL to ZPak  . Flomax (Tamsulosin Hcl)     swelling  . Pioglitazone     edema  . Pregabalin     REACTION: pt states INTOL to Lyrica  . Sulfonamide Derivatives     REACTION: SWELLING

## 2012-01-26 NOTE — Telephone Encounter (Signed)
Per SN---ok to send in levaquin 500 mg #7  1 daily, medrol dosepak  #1  Take as directed, otc delsym prn, mucinex, nasal saline spray.  Called and spoke with pt and she is aware of SN recs and of the meds that have been sent to the pharmacy.  Pt voiced her understanding and nothing further is needed

## 2012-01-31 ENCOUNTER — Telehealth: Payer: Self-pay | Admitting: Internal Medicine

## 2012-01-31 NOTE — Telephone Encounter (Signed)
Samples outfront 

## 2012-01-31 NOTE — Telephone Encounter (Signed)
Pt needs samples of xarelto

## 2012-02-17 ENCOUNTER — Telehealth: Payer: Self-pay | Admitting: Internal Medicine

## 2012-02-17 NOTE — Telephone Encounter (Signed)
Pt requesting samples of xarelto  [phone]

## 2012-02-17 NOTE — Telephone Encounter (Signed)
Samples outfront 

## 2012-02-20 ENCOUNTER — Ambulatory Visit: Payer: Medicare Other | Admitting: Pulmonary Disease

## 2012-03-14 ENCOUNTER — Telehealth: Payer: Self-pay | Admitting: Internal Medicine

## 2012-03-14 NOTE — Telephone Encounter (Signed)
Pt requesting samples of xaerlto

## 2012-03-15 ENCOUNTER — Ambulatory Visit: Payer: Medicare Other | Admitting: Pulmonary Disease

## 2012-03-19 NOTE — Telephone Encounter (Signed)
Samples outfront 

## 2012-03-20 ENCOUNTER — Encounter: Payer: Self-pay | Admitting: *Deleted

## 2012-03-29 ENCOUNTER — Telehealth: Payer: Self-pay | Admitting: Pulmonary Disease

## 2012-03-29 NOTE — Telephone Encounter (Signed)
I spoke with pt and she stated it is more diarrhea. She is aware of SN recs. She will try this and see if it helps. Nothing further was needed

## 2012-03-29 NOTE — Telephone Encounter (Signed)
Per SN----  Does she have diarrhea or leakage now? rec using sitz bath 2 times daily Use barrier cream  Zinc oxide, desitin cream and depends.  thanks

## 2012-03-29 NOTE — Telephone Encounter (Signed)
Patient states she is severely "gaulded" A few months ago she had bad diarrhea and because of Her weakened bladder when she had the diarrhea she also would leak urine. Now pt c/o bad burning and itching in her perineal area x 3-4 weeks Has tried several different types of OTC creams with no improvement says these keep her "moist" and she needs something to "dry her up" Requesting recs from Dr. Kriste Basque. Please advise, thank you  [facility] on [name]  Last OV: 8.9.13 Next OV: 4.7.14  Allergies  Allergen Reactions  . Azithromycin     REACTION: pt states INTOL to ZPak  . Flomax (Tamsulosin Hcl)     swelling  . Pioglitazone     edema  . Pregabalin     REACTION: pt states INTOL to Lyrica  . Sulfonamide Derivatives     REACTION: SWELLING

## 2012-03-30 ENCOUNTER — Other Ambulatory Visit: Payer: Self-pay | Admitting: Pulmonary Disease

## 2012-04-16 ENCOUNTER — Emergency Department (HOSPITAL_COMMUNITY): Payer: No Typology Code available for payment source

## 2012-04-16 ENCOUNTER — Encounter: Payer: Self-pay | Admitting: Pulmonary Disease

## 2012-04-16 ENCOUNTER — Emergency Department (HOSPITAL_COMMUNITY)
Admission: EM | Admit: 2012-04-16 | Discharge: 2012-04-16 | Disposition: A | Discharge status: Home / Self Care | Payer: No Typology Code available for payment source | Attending: Emergency Medicine | Admitting: Emergency Medicine

## 2012-04-16 ENCOUNTER — Ambulatory Visit (INDEPENDENT_AMBULATORY_CARE_PROVIDER_SITE_OTHER): Payer: Medicare Other | Admitting: Pulmonary Disease

## 2012-04-16 VITALS — BP 130/84 | HR 80 | Temp 98.0°F | Ht 65.0 in | Wt 134.0 lb

## 2012-04-16 DIAGNOSIS — M1990 Unspecified osteoarthritis, unspecified site: Secondary | ICD-10-CM

## 2012-04-16 DIAGNOSIS — I4891 Unspecified atrial fibrillation: Secondary | ICD-10-CM

## 2012-04-16 DIAGNOSIS — Z87448 Personal history of other diseases of urinary system: Secondary | ICD-10-CM | POA: Insufficient documentation

## 2012-04-16 DIAGNOSIS — M810 Age-related osteoporosis without current pathological fracture: Secondary | ICD-10-CM

## 2012-04-16 DIAGNOSIS — F411 Generalized anxiety disorder: Secondary | ICD-10-CM

## 2012-04-16 DIAGNOSIS — N3946 Mixed incontinence: Secondary | ICD-10-CM

## 2012-04-16 DIAGNOSIS — Z8673 Personal history of transient ischemic attack (TIA), and cerebral infarction without residual deficits: Secondary | ICD-10-CM | POA: Insufficient documentation

## 2012-04-16 DIAGNOSIS — Z8639 Personal history of other endocrine, nutritional and metabolic disease: Secondary | ICD-10-CM | POA: Insufficient documentation

## 2012-04-16 DIAGNOSIS — I10 Essential (primary) hypertension: Secondary | ICD-10-CM | POA: Insufficient documentation

## 2012-04-16 DIAGNOSIS — E1149 Type 2 diabetes mellitus with other diabetic neurological complication: Secondary | ICD-10-CM

## 2012-04-16 DIAGNOSIS — E7800 Pure hypercholesterolemia, unspecified: Secondary | ICD-10-CM

## 2012-04-16 DIAGNOSIS — D126 Benign neoplasm of colon, unspecified: Secondary | ICD-10-CM

## 2012-04-16 DIAGNOSIS — E119 Type 2 diabetes mellitus without complications: Secondary | ICD-10-CM | POA: Insufficient documentation

## 2012-04-16 DIAGNOSIS — K449 Diaphragmatic hernia without obstruction or gangrene: Secondary | ICD-10-CM

## 2012-04-16 DIAGNOSIS — Z8719 Personal history of other diseases of the digestive system: Secondary | ICD-10-CM | POA: Insufficient documentation

## 2012-04-16 DIAGNOSIS — Z8739 Personal history of other diseases of the musculoskeletal system and connective tissue: Secondary | ICD-10-CM | POA: Insufficient documentation

## 2012-04-16 DIAGNOSIS — M545 Low back pain: Secondary | ICD-10-CM

## 2012-04-16 DIAGNOSIS — Z79899 Other long term (current) drug therapy: Secondary | ICD-10-CM | POA: Insufficient documentation

## 2012-04-16 DIAGNOSIS — D649 Anemia, unspecified: Secondary | ICD-10-CM

## 2012-04-16 DIAGNOSIS — I509 Heart failure, unspecified: Secondary | ICD-10-CM

## 2012-04-16 DIAGNOSIS — J019 Acute sinusitis, unspecified: Secondary | ICD-10-CM

## 2012-04-16 DIAGNOSIS — Y939 Activity, unspecified: Secondary | ICD-10-CM | POA: Insufficient documentation

## 2012-04-16 DIAGNOSIS — R413 Other amnesia: Secondary | ICD-10-CM

## 2012-04-16 DIAGNOSIS — Z862 Personal history of diseases of the blood and blood-forming organs and certain disorders involving the immune mechanism: Secondary | ICD-10-CM | POA: Insufficient documentation

## 2012-04-16 DIAGNOSIS — R079 Chest pain, unspecified: Secondary | ICD-10-CM

## 2012-04-16 DIAGNOSIS — S298XXA Other specified injuries of thorax, initial encounter: Secondary | ICD-10-CM | POA: Insufficient documentation

## 2012-04-16 DIAGNOSIS — Y92410 Unspecified street and highway as the place of occurrence of the external cause: Secondary | ICD-10-CM | POA: Insufficient documentation

## 2012-04-16 DIAGNOSIS — I5022 Chronic systolic (congestive) heart failure: Secondary | ICD-10-CM | POA: Insufficient documentation

## 2012-04-16 DIAGNOSIS — Z7901 Long term (current) use of anticoagulants: Secondary | ICD-10-CM | POA: Insufficient documentation

## 2012-04-16 MED ORDER — HYDROCODONE-ACETAMINOPHEN 5-325 MG PO TABS: 1.0000 | ORAL_TABLET | ORAL | Status: DC | PRN

## 2012-04-16 MED ORDER — FLUCONAZOLE 100 MG PO TABS: 100.0000 mg | ORAL_TABLET | Freq: Every day | ORAL | Status: DC

## 2012-04-16 MED ORDER — AMOXICILLIN-POT CLAVULANATE 875-125 MG PO TABS: 1.0000 | ORAL_TABLET | Freq: Two times a day (BID) | ORAL | Status: DC

## 2012-04-16 NOTE — ED Notes (Signed)
Pt ambulated in hall and to the BR for toileting--- gait and balance steady, denies dizziness.
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
## 2012-04-16 NOTE — Patient Instructions (Addendum)
Today we updated your med list in our EPIC system...    Continue your current medications the same...  Today we added Augmentin for your Sinuses (take one tab twice daily til gone)...    And Diflucan 100mg /d for your urinary tract - take one tab daily til gone...    It is also helpful to use a PROBIOTIC like ALIGN one daily & use the Activia yogurt...  Remember to use a Saline nasal mist to help keep the nasal passages open & draining (you may spray every 1-2H).Marland KitchenMarland Kitchen  We will arrange for a follow up w/ your Urologist regarding the Urinary incontinence...  Please return to our lab one morning this week for your FASTING blood work...    We will contact you w/ the results when available...   Call for any questions...  Let's plan a follow up visit in 69mo, sooner if needed for problems.Marland KitchenMarland Kitchen

## 2012-04-17 NOTE — Telephone Encounter (Signed)
This encounter was created in error - please disregard.
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
## 2012-04-19 ENCOUNTER — Telehealth: Payer: Self-pay | Admitting: *Deleted

## 2012-04-19 NOTE — Telephone Encounter (Signed)
Pt called requesting samples for Xarelto 20 mg and 6 boxes of Xarelto obtained to be given to pt .Will  Pick them up on Monday 04/23/2012

## 2012-05-02 ENCOUNTER — Ambulatory Visit: Payer: Medicare Other | Admitting: Internal Medicine

## 2012-05-04 ENCOUNTER — Ambulatory Visit (INDEPENDENT_AMBULATORY_CARE_PROVIDER_SITE_OTHER): Payer: Medicare Other | Admitting: Internal Medicine

## 2012-05-04 ENCOUNTER — Encounter: Payer: Self-pay | Admitting: Internal Medicine

## 2012-05-04 VITALS — BP 145/88 | HR 74 | Ht 60.0 in | Wt 132.0 lb

## 2012-05-04 DIAGNOSIS — I10 Essential (primary) hypertension: Secondary | ICD-10-CM

## 2012-05-04 DIAGNOSIS — I4891 Unspecified atrial fibrillation: Secondary | ICD-10-CM

## 2012-05-04 MED ORDER — FUROSEMIDE 20 MG PO TABS: 20.0000 mg | ORAL_TABLET | ORAL | Status: DC | PRN

## 2012-05-04 NOTE — Patient Instructions (Signed)
Your physician wants you to follow-up in: 12 months with Dr Court Joy will receive a reminder letter in the mail two months in advance. If you don't receive a letter, please call our office to schedule the follow-up appointment.    Your physician has recommended you make the following change in your medication:  1) Start Furosemide 20mg  as needed for leg swelling

## 2012-05-05 ENCOUNTER — Encounter: Payer: Self-pay | Admitting: Internal Medicine

## 2012-05-05 NOTE — Progress Notes (Signed)
HPI Michelle Herrera returns today for followup. She is a very pleasant 79 year old woman with hypertension and chronic atrial fibrillation. She also is a history of dyslipidemia, and has been maintained on chronic anticoagulation. In the interim, she has done well. She remains active, and denies chest pain, or shortness of breath. Minimal peripheral edema. She remains active walking on a regular basis. Allergies  Allergen Reactions  . Azithromycin     REACTION: pt states INTOL to ZPak  . Flomax (Tamsulosin Hcl)     swelling  . Pioglitazone     edema  . Pregabalin     REACTION: pt states INTOL to Lyrica  . Sulfonamide Derivatives     REACTION: SWELLING     Current Outpatient Prescriptions  Medication Sig Dispense Refill  . amoxicillin-clavulanate (AUGMENTIN) 875-125 MG per tablet Take 1 tablet by mouth 2 (two) times daily.  14 tablet  0  . atenolol (TENORMIN) 50 MG tablet TAKE ONE TABLET BY MOUTH TWICE DAILY  60 tablet  6  . Blood Glucose Monitoring Suppl (ONE TOUCH ULTRA SYSTEM KIT) W/DEVICE KIT ONE TOUCH ULTRA LIGHT MINI,  DX: 250.00  1 each  0  . fluconazole (DIFLUCAN) 100 MG tablet Take 1 tablet (100 mg total) by mouth daily.  7 tablet  0  . gabapentin (NEURONTIN) 100 MG capsule TAKE ONE CAPSULE BY MOUTH THREE TIMES DAILY  90 capsule  0  . glipiZIDE (GLUCOTROL) 10 MG tablet TAKE ONE TABLET BY MOUTH TWICE DAILY  60 tablet  4  . glucose blood (ONE TOUCH ULTRA TEST) test strip 1 each by Other route daily. And lancets 1/day 250.02  100 each  12  . HYDROcodone-acetaminophen (NORCO/VICODIN) 5-325 MG per tablet Take 1 tablet by mouth every 4 (four) hours as needed for pain.  15 tablet  0  . Lancets (ONETOUCH ULTRASOFT) lancets Use as instructed,  DX: 250.00  100 each  12  . metFORMIN (GLUCOPHAGE) 500 MG tablet TAKE TWO TABLETS BY MOUTH TWICE DAILY WITH MEALS  120 tablet  11  . Rivaroxaban (XARELTO) 20 MG TABS Take 1 tablet (20 mg total) by mouth daily.  30 tablet  0  . vitamin B-12  (CYANOCOBALAMIN) 1000 MCG tablet Take 1,000 mcg by mouth daily.        . furosemide (LASIX) 20 MG tablet Take 1 tablet (20 mg total) by mouth as needed.  30 tablet  3   No current facility-administered medications for this visit.     Past Medical History  Diagnosis Date  . HTN (hypertension)   . Chronic systolic heart failure   . Pure hypercholesterolemia   . Type II or unspecified type diabetes mellitus without mention of complication, not stated as uncontrolled   . Anxiety state, unspecified   . Diaphragmatic hernia without mention of obstruction or gangrene   . Benign neoplasm of colon   . Mixed incontinence urge and stress (female)(female)   . Osteoarthrosis, unspecified whether generalized or localized, unspecified site   . Lumbago   . Pain in joint, shoulder region   . Osteoporosis, unspecified   . Memory loss   . Renal infarction     01/2011 in setting of low INR  . History of stroke   . AF (atrial fibrillation)   . Dyslipidemia     ROS:   All systems reviewed and negative except as noted in the HPI.   Past Surgical History  Procedure Laterality Date  . Cholecystectomy    . Rt.leg surgery  11/2010  .  Cataract surgery/both eyes      01/2012 left eye---02/2012 right eye     Family History  Problem Relation Age of Onset  . Stomach cancer Father   . Pneumonia Mother      History   Social History  . Marital Status: Widowed    Spouse Name: N/A    Number of Children: N/A  . Years of Education: N/A   Occupational History  . Not on file.   Social History Main Topics  . Smoking status: Never Smoker   . Smokeless tobacco: Never Used  . Alcohol Use: No  . Drug Use: No  . Sexually Active: No   Other Topics Concern  . Not on file   Social History Narrative   1 sister--in poor health   1 brother hx of kidney stones and heart problems     BP 145/88  Pulse 74  Ht 5' (1.524 m)  Wt 132 lb (59.875 kg)  BMI 25.78 kg/m2  Physical Exam:  Well appearing  79 year old woman, NAD HEENT: Unremarkable Neck:  7 cm JVD, no thyromegally Back:  No CVA tenderness Lungs:  Clear with no wheezes, rales, or rhonchi. HEART:  Regular rate rhythm, no murmurs, no rubs, no clicks Abd:  soft, positive bowel sounds, no organomegally, no rebound, no guarding Ext:  2 plus pulses, no edema, no cyanosis, no clubbing Skin:  No rashes no nodules Neuro:  CN II through XII intact, motor grossly intact  EKG - atrial fibrillation with a controlled ventricular response and nonspecific ST-T wave abnormality  Assess/Plan:

## 2012-05-05 NOTE — Assessment & Plan Note (Signed)
Her blood pressure is well controlled. I've encouraged the patient to reduce her salt intake, and she will continue her current medical therapy.

## 2012-05-05 NOTE — Assessment & Plan Note (Signed)
Her ventricular rate appears to be well-controlled in atrial fibrillation. I recommended no medical changes today.

## 2012-05-21 ENCOUNTER — Telehealth: Payer: Self-pay | Admitting: Internal Medicine

## 2012-05-21 NOTE — Telephone Encounter (Signed)
New problem   Pt want samples of Xarelto 20mg . Please call pt

## 2012-05-21 NOTE — Telephone Encounter (Signed)
Patient requesting samples of Xarelto 20 mg.  Patient states she falls in the "doughnut" and cannot afford medication.  Xarelto 20 mg 6 boxes placed out front for patient.

## 2012-05-23 ENCOUNTER — Other Ambulatory Visit: Payer: Self-pay | Admitting: Pulmonary Disease

## 2012-05-25 ENCOUNTER — Emergency Department (HOSPITAL_COMMUNITY): Payer: Medicare Other

## 2012-05-25 ENCOUNTER — Inpatient Hospital Stay (HOSPITAL_COMMUNITY)
Admission: EM | Admit: 2012-05-25 | Discharge: 2012-05-29 | DRG: 280 | Disposition: A | Discharge status: Home / Self Care | Payer: Medicare Other | Attending: Family Medicine | Admitting: Family Medicine

## 2012-05-25 ENCOUNTER — Encounter (HOSPITAL_COMMUNITY): Payer: Self-pay

## 2012-05-25 DIAGNOSIS — M810 Age-related osteoporosis without current pathological fracture: Secondary | ICD-10-CM

## 2012-05-25 DIAGNOSIS — E538 Deficiency of other specified B group vitamins: Secondary | ICD-10-CM

## 2012-05-25 DIAGNOSIS — R079 Chest pain, unspecified: Secondary | ICD-10-CM

## 2012-05-25 DIAGNOSIS — Z7901 Long term (current) use of anticoagulants: Secondary | ICD-10-CM

## 2012-05-25 DIAGNOSIS — R413 Other amnesia: Secondary | ICD-10-CM

## 2012-05-25 DIAGNOSIS — I10 Essential (primary) hypertension: Secondary | ICD-10-CM

## 2012-05-25 DIAGNOSIS — R269 Unspecified abnormalities of gait and mobility: Secondary | ICD-10-CM

## 2012-05-25 DIAGNOSIS — K449 Diaphragmatic hernia without obstruction or gangrene: Secondary | ICD-10-CM

## 2012-05-25 DIAGNOSIS — J449 Chronic obstructive pulmonary disease, unspecified: Secondary | ICD-10-CM | POA: Diagnosis present

## 2012-05-25 DIAGNOSIS — E785 Hyperlipidemia, unspecified: Secondary | ICD-10-CM | POA: Diagnosis present

## 2012-05-25 DIAGNOSIS — N3946 Mixed incontinence: Secondary | ICD-10-CM

## 2012-05-25 DIAGNOSIS — I5043 Acute on chronic combined systolic (congestive) and diastolic (congestive) heart failure: Secondary | ICD-10-CM | POA: Diagnosis present

## 2012-05-25 DIAGNOSIS — I509 Heart failure, unspecified: Secondary | ICD-10-CM

## 2012-05-25 DIAGNOSIS — E118 Type 2 diabetes mellitus with unspecified complications: Secondary | ICD-10-CM | POA: Diagnosis present

## 2012-05-25 DIAGNOSIS — IMO0002 Reserved for concepts with insufficient information to code with codable children: Secondary | ICD-10-CM | POA: Diagnosis present

## 2012-05-25 DIAGNOSIS — F411 Generalized anxiety disorder: Secondary | ICD-10-CM

## 2012-05-25 DIAGNOSIS — I2789 Other specified pulmonary heart diseases: Secondary | ICD-10-CM | POA: Diagnosis present

## 2012-05-25 DIAGNOSIS — I4821 Permanent atrial fibrillation: Secondary | ICD-10-CM | POA: Diagnosis present

## 2012-05-25 DIAGNOSIS — R0602 Shortness of breath: Secondary | ICD-10-CM

## 2012-05-25 DIAGNOSIS — D649 Anemia, unspecified: Secondary | ICD-10-CM

## 2012-05-25 DIAGNOSIS — N280 Ischemia and infarction of kidney: Secondary | ICD-10-CM

## 2012-05-25 DIAGNOSIS — M1990 Unspecified osteoarthritis, unspecified site: Secondary | ICD-10-CM

## 2012-05-25 DIAGNOSIS — Z86711 Personal history of pulmonary embolism: Secondary | ICD-10-CM

## 2012-05-25 DIAGNOSIS — M545 Low back pain, unspecified: Secondary | ICD-10-CM

## 2012-05-25 DIAGNOSIS — I4891 Unspecified atrial fibrillation: Secondary | ICD-10-CM

## 2012-05-25 DIAGNOSIS — J4489 Other specified chronic obstructive pulmonary disease: Secondary | ICD-10-CM | POA: Diagnosis present

## 2012-05-25 DIAGNOSIS — Z8673 Personal history of transient ischemic attack (TIA), and cerebral infarction without residual deficits: Secondary | ICD-10-CM

## 2012-05-25 DIAGNOSIS — I214 Non-ST elevation (NSTEMI) myocardial infarction: Principal | ICD-10-CM

## 2012-05-25 DIAGNOSIS — D126 Benign neoplasm of colon, unspecified: Secondary | ICD-10-CM

## 2012-05-25 DIAGNOSIS — E1149 Type 2 diabetes mellitus with other diabetic neurological complication: Secondary | ICD-10-CM

## 2012-05-25 DIAGNOSIS — E7800 Pure hypercholesterolemia, unspecified: Secondary | ICD-10-CM

## 2012-05-25 HISTORY — DX: Atherosclerotic heart disease of native coronary artery without angina pectoris: I25.10

## 2012-05-25 HISTORY — DX: Chronic combined systolic (congestive) and diastolic (congestive) heart failure: I50.42

## 2012-05-25 HISTORY — DX: Nonrheumatic mitral (valve) insufficiency: I34.0

## 2012-05-25 LAB — POCT I-STAT TROPONIN I

## 2012-05-25 LAB — CBC
Hemoglobin: 14.9 g/dL (ref 12.0–15.0)
MCH: 31.4 pg (ref 26.0–34.0)
MCV: 90.9 fL (ref 78.0–100.0)
RBC: 4.74 MIL/uL (ref 3.87–5.11)

## 2012-05-25 LAB — BASIC METABOLIC PANEL
CO2: 27 mEq/L (ref 19–32)
Calcium: 9.8 mg/dL (ref 8.4–10.5)
Chloride: 98 mEq/L (ref 96–112)
Creatinine, Ser: 0.67 mg/dL (ref 0.50–1.10)
Glucose, Bld: 246 mg/dL — ABNORMAL HIGH (ref 70–99)
Sodium: 138 mEq/L (ref 135–145)

## 2012-05-25 LAB — TROPONIN I
Troponin I: <0.3 ng/mL (ref <0.30)
Troponin I: 0.31 ng/mL (ref <0.30)

## 2012-05-25 LAB — CG4 I-STAT (LACTIC ACID): Lactic Acid, Venous: 2.61 mmol/L — ABNORMAL HIGH (ref 0.5–2.2)

## 2012-05-25 LAB — GLUCOSE, CAPILLARY
Glucose-Capillary: 216 mg/dL — ABNORMAL HIGH (ref 70–99)
Glucose-Capillary: 116 mg/dL — ABNORMAL HIGH (ref 70–99)
Glucose-Capillary: 195 mg/dL — ABNORMAL HIGH (ref 70–99)

## 2012-05-25 LAB — PRO B NATRIURETIC PEPTIDE: Pro B Natriuretic peptide (BNP): 1616 pg/mL — ABNORMAL HIGH (ref 0–450)

## 2012-05-25 MED ORDER — PANTOPRAZOLE SODIUM 40 MG PO TBEC
40.0000 mg | DELAYED_RELEASE_TABLET | Freq: Every day | ORAL | Status: DC
  Administered 2012-05-25 – 2012-05-29 (×4): 40 mg via ORAL
  Filled 2012-05-25 (×5): qty 1

## 2012-05-25 MED ORDER — ATENOLOL 50 MG PO TABS
50.0000 mg | ORAL_TABLET | Freq: Two times a day (BID) | ORAL | Status: DC
  Administered 2012-05-25 – 2012-05-29 (×8): 50 mg via ORAL
  Filled 2012-05-25 (×11): qty 1

## 2012-05-25 MED ORDER — GABAPENTIN 100 MG PO CAPS
100.0000 mg | ORAL_CAPSULE | Freq: Three times a day (TID) | ORAL | Status: DC
  Administered 2012-05-25 – 2012-05-29 (×12): 100 mg via ORAL
  Filled 2012-05-25 (×17): qty 1

## 2012-05-25 MED ORDER — FUROSEMIDE 10 MG/ML IJ SOLN
40.0000 mg | Freq: Every day | INTRAMUSCULAR | Status: DC
  Administered 2012-05-25: 40 mg via INTRAVENOUS
  Filled 2012-05-25 (×2): qty 4

## 2012-05-25 MED ORDER — RIVAROXABAN 20 MG PO TABS
20.0000 mg | ORAL_TABLET | Freq: Every evening | ORAL | Status: DC
  Administered 2012-05-25: 20 mg via ORAL
  Filled 2012-05-25 (×2): qty 1

## 2012-05-25 MED ORDER — GLIPIZIDE 10 MG PO TABS
10.0000 mg | ORAL_TABLET | Freq: Two times a day (BID) | ORAL | Status: DC
  Administered 2012-05-25 – 2012-05-26 (×2): 10 mg via ORAL
  Filled 2012-05-25 (×4): qty 1

## 2012-05-25 MED ORDER — ACETAMINOPHEN 325 MG PO TABS
650.0000 mg | ORAL_TABLET | Freq: Four times a day (QID) | ORAL | Status: DC | PRN
  Administered 2012-05-25 – 2012-05-28 (×3): 650 mg via ORAL
  Filled 2012-05-25 (×3): qty 2

## 2012-05-25 MED ORDER — INSULIN ASPART 100 UNIT/ML ~~LOC~~ SOLN
0.0000 [IU] | Freq: Three times a day (TID) | SUBCUTANEOUS | Status: DC
  Administered 2012-05-25 – 2012-05-26 (×3): 5 [IU] via SUBCUTANEOUS
  Administered 2012-05-26: 2 [IU] via SUBCUTANEOUS
  Administered 2012-05-27: 8 [IU] via SUBCUTANEOUS
  Administered 2012-05-27 – 2012-05-28 (×3): 5 [IU] via SUBCUTANEOUS
  Administered 2012-05-29: 3 [IU] via SUBCUTANEOUS

## 2012-05-25 MED ORDER — SODIUM CHLORIDE 0.9 % IJ SOLN
3.0000 mL | Freq: Two times a day (BID) | INTRAMUSCULAR | Status: DC
  Administered 2012-05-25 – 2012-05-27 (×4): 3 mL via INTRAVENOUS

## 2012-05-25 MED ORDER — VITAMIN B-12 1000 MCG PO TABS
1000.0000 ug | ORAL_TABLET | Freq: Every morning | ORAL | Status: DC
  Administered 2012-05-25 – 2012-05-29 (×4): 1000 ug via ORAL
  Filled 2012-05-25 (×5): qty 1

## 2012-05-25 NOTE — H&P (Signed)
[facility] History and Physical  Michelle Herrera ZOX:[id number] DOB: 10-28-1930 DOA: 05/25/2012  Referring physician: Dr. Rulon Abide PCP: Michele Mcalpine, MD  Specialists: Patient sees Dr. Sharrell Ku with cardiology as outpatient  Chief Complaint: Chest discomfort  HPI: Michelle Herrera is a 79 y.o. female  With history of systolic (EF of 40-45%)  and diastolic HF, history of PE, Atrial fibrillation on xarelto, HTN, DM who presented to the ED complaining of chest pain which occurred this morning around 530 AM per patient.  Patient was in bed when this occurred.  The pain is described as an "ache" that was located at lower mid chest near epigastric area.  Reportedly patient also felt her heart racing at that time.  She states that she took an atenolol with no relief.  The pain resolved spontaneously after an hour reportedly.  Currently patient completely denies any chest pain.  She denies any radiation to left arm or neck.    We were consulted for evaluation and recommendations for admission given chest pain with history of comorbidities listed above and below.  While in the ED D-dimer was negative, Troponin was negative, EKG negative for ST elevation or depressions.   Review of Systems: 10 point review of system reviewed and negative unless otherwise mentioned above.  Past Medical History  Diagnosis Date  . HTN (hypertension)   . Chronic systolic heart failure   . Pure hypercholesterolemia   . Type II or unspecified type diabetes mellitus without mention of complication, not stated as uncontrolled   . Anxiety state, unspecified   . Diaphragmatic hernia without mention of obstruction or gangrene   . Benign neoplasm of colon   . Mixed incontinence urge and stress (female)(female)   . Osteoarthrosis, unspecified whether generalized or localized, unspecified site   . Lumbago   . Pain in joint, shoulder region   . Osteoporosis, unspecified   . Memory loss   . Renal infarction     01/2011  in setting of low INR  . History of stroke   . AF (atrial fibrillation)   . Dyslipidemia    Past Surgical History  Procedure Laterality Date  . Cholecystectomy    . Rt.leg surgery  11/2010  . Cataract surgery/both eyes      01/2012 left eye---02/2012 right eye   Social History:  reports that she has never smoked. She has never used smokeless tobacco. She reports that she does not drink alcohol or use illicit drugs.  where does patient live--home, ALF, SNF? and with whom if at home? Lives at home  Can patient participate in ADLs? yes  Allergies  Allergen Reactions  . Azithromycin     REACTION: pt states INTOL to ZPak  . Flomax (Tamsulosin Hcl) Swelling    swelling  . Pioglitazone     edema  . Pregabalin     REACTION: pt states INTOL to Lyrica  . Sulfonamide Derivatives Swelling    REACTION: SWELLING    Family History  Problem Relation Age of Onset  . Stomach cancer Father   . Pneumonia Mother   None other reported by patient  Prior to Admission medications   Medication Sig Start Date End Date Taking? Authorizing Provider  atenolol (TENORMIN) 50 MG tablet Take 50 mg by mouth 2 (two) times daily.   Yes Historical Provider, MD  gabapentin (NEURONTIN) 100 MG capsule Take 100 mg by mouth 3 (three) times daily.   Yes Historical Provider, MD  glipiZIDE (GLUCOTROL) 10 MG tablet Take 10  mg by mouth 2 (two) times daily before a meal.   Yes Historical Provider, MD  metFORMIN (GLUCOPHAGE) 500 MG tablet Take 1,000 mg by mouth 2 (two) times daily with a meal.   Yes Historical Provider, MD  Rivaroxaban (XARELTO) 20 MG TABS Take 20 mg by mouth every evening. 12/09/11  Yes Marinus Maw, MD  vitamin B-12 (CYANOCOBALAMIN) 1000 MCG tablet Take 1,000 mcg by mouth every morning.    Yes Historical Provider, MD   Physical Exam: Filed Vitals:   05/25/12 1610 05/25/12 0716 05/25/12 0841 05/25/12 0954  BP: 186/125 176/101 175/90 180/105  Pulse:    88  Temp:  98.7 F (37.1 C) 97.7 F (36.5 C)  97.6 F (36.4 C)  TempSrc:  Oral Oral Oral  Resp:  18 16   Height:    5\' 5"  (1.651 m)  Weight:    61.236 kg (135 lb)  SpO2:  99% 100% 100%     General:  Pt in NAD, Alert and Awake  Eyes: EOMI, non icteric  ENT: normal exterior appearance  Neck: supple, no goiter  Cardiovascular: Irregular regular, no rubs  Respiratory: decreased breath sounds at baseline, no wheezes  Abdomen: soft, NT, ND  Skin: warm and dry  Musculoskeletal: no cyanosis or clubbing  Psychiatric: mood and affect appropriate  Neurologic: moves all extremities and answers questions appropriately  Labs on Admission:  Basic Metabolic Panel:  Recent Labs Lab 05/25/12 0625  NA 138  K 3.5  CL 98  CO2 27  GLUCOSE 246*  BUN 9  CREATININE 0.67  CALCIUM 9.8   Liver Function Tests: No results found for this basename: AST, ALT, ALKPHOS, BILITOT, PROT, ALBUMIN,  in the last 168 hours No results found for this basename: LIPASE, AMYLASE,  in the last 168 hours No results found for this basename: AMMONIA,  in the last 168 hours CBC:  Recent Labs Lab 05/25/12 0625  WBC 5.6  HGB 14.9  HCT 43.1  MCV 90.9  PLT 218   Cardiac Enzymes: No results found for this basename: CKTOTAL, CKMB, CKMBINDEX, TROPONINI,  in the last 168 hours  BNP (last 3 results)  Recent Labs  05/25/12 0625  PROBNP 1616.0*   CBG: No results found for this basename: GLUCAP,  in the last 168 hours  Radiological Exams on Admission: Dg Chest Port 1 View  05/25/2012   *RADIOLOGY REPORT*  Clinical Data: Chest pain  PORTABLE CHEST - 1 VIEW  Comparison: 04/16/2012  Findings: Cardiomegaly without CHF or pneumonia.  Stable background COPD/emphysema with apical scarring.  Lungs remain hyperinflated. No focal airspace process, edema, or effusion.  Degenerative changes of the spine.  IMPRESSION: Cardiomegaly without CHF or pneumonia  Stable COPD/emphysema with apical scarring   Original Report Authenticated By: Judie Petit. Miles Costain, M.D.    EKG:  Independently reviewed. Atrial fibrillation rate controlled with no ST elevations or depressions  Assessment/Plan Active Problems:   HYPERTENSION   CHF   Atrial fibrillation   Vitamin B 12 deficiency   Type II or unspecified type diabetes mellitus with neurological manifestations, uncontrolled(250.62)   Chest pain   1. Chest pain - Telemetry monitoring - sounds atypical for cardiac in nature.  Will try trial of protonix - Troponins x 3  2. HTN - Not well controlled.  Will continue home regimen and reassess throughout the course of the day.    3. Atrial fibrillation - Pt on B blocker for rate control - Xarelto for anticoagulation  4. Vitamin B 12  -  continue replacement  5. Type II DM - hold metformin - continue glipizide - SSI - diabetic diet with routine cbg monitoring  6. Acute on chronic systolic and diastolic CHF - elevated BNP - Monitor I/O's - fluid restrict - Daily weights - lasix 40 mg IV daily   Code Status:  Full code Family Communication: No family at bedside Disposition Plan:  Pending work up.  If work up negative would consider d/c with further work up as outpatient.  Time spent: > 60 minutes  Penny Pia [facility] Pager [phone]  If 7PM-7AM, please contact night-coverage [email or web address] Password [email or web address] 05/25/2012, 10:00 AM

## 2012-05-25 NOTE — Progress Notes (Signed)
Dr. Cena Benton via phone given update regarding Troponin level 0.31. VSS No acute changes noted with Pt's assessment. MD to ask [facility] to see Pt. Maintain current plan of care.

## 2012-05-25 NOTE — ED Notes (Signed)
Pt c/o of central chest pain that woke her from her sleep. Pt states that the pain "just hurts" denying sharp shooting pain. Pt states that the pain goes to her stomach. No nausea or vomiting or pain in no other areas.

## 2012-05-25 NOTE — Progress Notes (Signed)
BP 110/71  Pulse 89. Maintain current plan of care.

## 2012-05-25 NOTE — ED Notes (Signed)
Gave CG4 Lactic Acid to Dr. Rulon Abide.

## 2012-05-25 NOTE — ED Notes (Signed)
Patient states that she is not hurting but just shortness of breath

## 2012-05-25 NOTE — Progress Notes (Signed)
MD via phone given update regarding elevated BP. Will give po BP medication and recheck BP.

## 2012-05-25 NOTE — Progress Notes (Signed)
BP 149/83 pulse 73. Maintain current plan of care.

## 2012-05-25 NOTE — ED Provider Notes (Signed)
History     CSN: [id number]  Arrival date & time 05/25/12  7829   First MD Initiated Contact with Patient 05/25/12 [phone]      Chief Complaint  Patient presents with  . Chest Pain    (Consider location/radiation/quality/duration/timing/severity/associated sxs/prior treatment) HPI Comments: Patient presents to the emergency department with chief complaint of chest pain and abdominal pain. She states that she was awakened from sleeping approximately 2 hours ago with chest pain that radiated to her abdomen. She states that the pain was moderate to severe. She describes as shooting pain. She denies any active chest pain now. She states that her pain wasn't associated with shortness of breath. She tried taking atenolol for her symptoms with no relief. She denies fevers, chills, nausea , vomiting, diaphoresis, diarrhea, constipation, numbness or tingling of the extremities.  The history is provided by the patient. No language interpreter was used.    Past Medical History  Diagnosis Date  . HTN (hypertension)   . Chronic systolic heart failure   . Pure hypercholesterolemia   . Type II or unspecified type diabetes mellitus without mention of complication, not stated as uncontrolled   . Anxiety state, unspecified   . Diaphragmatic hernia without mention of obstruction or gangrene   . Benign neoplasm of colon   . Mixed incontinence urge and stress (female)(female)   . Osteoarthrosis, unspecified whether generalized or localized, unspecified site   . Lumbago   . Pain in joint, shoulder region   . Osteoporosis, unspecified   . Memory loss   . Renal infarction     01/2011 in setting of low INR  . History of stroke   . AF (atrial fibrillation)   . Dyslipidemia     Past Surgical History  Procedure Laterality Date  . Cholecystectomy    . Rt.leg surgery  11/2010  . Cataract surgery/both eyes      01/2012 left eye---02/2012 right eye    Family History  Problem Relation Age of Onset  .  Stomach cancer Father   . Pneumonia Mother     History  Substance Use Topics  . Smoking status: Never Smoker   . Smokeless tobacco: Never Used  . Alcohol Use: No    OB History   Grav Para Term Preterm Abortions TAB SAB Ect Mult Living                  Review of Systems  All other systems reviewed and are negative.    Allergies  Azithromycin; Flomax; Pioglitazone; Pregabalin; and Sulfonamide derivatives  Home Medications   Current Outpatient Rx  Name  Route  Sig  Dispense  Refill  . amoxicillin-clavulanate (AUGMENTIN) 875-125 MG per tablet   Oral   Take 1 tablet by mouth 2 (two) times daily.   14 tablet   0   . atenolol (TENORMIN) 50 MG tablet      TAKE ONE TABLET BY MOUTH TWICE DAILY   60 tablet   6   . Blood Glucose Monitoring Suppl (ONE TOUCH ULTRA SYSTEM KIT) W/DEVICE KIT      ONE TOUCH ULTRA LIGHT MINI,  DX: 250.00   1 each   0   . fluconazole (DIFLUCAN) 100 MG tablet   Oral   Take 1 tablet (100 mg total) by mouth daily.   7 tablet   0   . furosemide (LASIX) 20 MG tablet   Oral   Take 1 tablet (20 mg total) by mouth as needed.   30  tablet   3   . gabapentin (NEURONTIN) 100 MG capsule      TAKE ONE CAPSULE BY MOUTH THREE TIMES DAILY   90 capsule   3   . glipiZIDE (GLUCOTROL) 10 MG tablet      TAKE ONE TABLET BY MOUTH TWICE DAILY   60 tablet   4   . glucose blood (ONE TOUCH ULTRA TEST) test strip   Other   1 each by Other route daily. And lancets 1/day 250.02   100 each   12   . HYDROcodone-acetaminophen (NORCO/VICODIN) 5-325 MG per tablet   Oral   Take 1 tablet by mouth every 4 (four) hours as needed for pain.   15 tablet   0   . Lancets (ONETOUCH ULTRASOFT) lancets      Use as instructed,  DX: 250.00   100 each   12   . metFORMIN (GLUCOPHAGE) 500 MG tablet      TAKE TWO TABLETS BY MOUTH TWICE DAILY WITH MEALS   120 tablet   11   . Rivaroxaban (XARELTO) 20 MG TABS   Oral   Take 1 tablet (20 mg total) by mouth daily.    30 tablet   0   . vitamin B-12 (CYANOCOBALAMIN) 1000 MCG tablet   Oral   Take 1,000 mcg by mouth daily.             BP 186/125  Pulse 98  Temp(Src) 97.6 F (36.4 C) (Oral)  Resp 27  Ht 5\' 5"  (1.651 m)  Wt 135 lb (61.236 kg)  BMI 22.47 kg/m2  SpO2 100%  Physical Exam  Nursing note and vitals reviewed. Constitutional: She is oriented to person, place, and time. She appears well-developed and well-nourished.  HENT:  Head: Normocephalic and atraumatic.  Eyes: Conjunctivae and EOM are normal. Pupils are equal, round, and reactive to light.  Neck: Normal range of motion. Neck supple.  Cardiovascular: Normal rate.  Exam reveals no gallop and no friction rub.   No murmur heard. Irregularly irregular  Pulmonary/Chest: Effort normal and breath sounds normal. No respiratory distress. She has no wheezes. She has no rales. She exhibits no tenderness.  Abdominal: Soft. Bowel sounds are normal. She exhibits no distension and no mass. There is tenderness. There is no rebound and no guarding.  Midline abdominal tenderness, no right lower quadrant tenderness or pain at McBurney's point, no left lower quadrant tenderness, no fluid wave or signs of peritonitis  Musculoskeletal: Normal range of motion. She exhibits no edema and no tenderness.  Neurological: She is alert and oriented to person, place, and time.  Skin: Skin is warm and dry.  Psychiatric: She has a normal mood and affect. Her behavior is normal. Judgment and thought content normal.    ED Course  Procedures (including critical care time)  Labs Reviewed  CBC  BASIC METABOLIC PANEL  POCT I-STAT TROPONIN I   No results found.  ED ECG REPORT  I personally interpreted this EKG   Date: 05/25/2012   Rate: 104  Rhythm: atrial fibrillation  QRS Axis: right  Intervals:  ST/T Wave abnormalities: normal  Conduction Disutrbances:New PVCs  Narrative Interpretation:   Old EKG Reviewed: changes noted  ED ECG REPORT  I personally  interpreted this EKG   Date: 05/25/2012   Rate: 91  Rhythm: atrial fibrillation  QRS Axis: right  Intervals:   ST/T Wave abnormalities: normal  Conduction Disutrbances:Multiple PVCs  Narrative Interpretation:   Old EKG Reviewed: changes noted, no longer  tachycardic  Results for orders placed during the hospital encounter of 05/25/12  CBC      Result Value Range   WBC 5.6  4.0 - 10.5 K/uL   RBC 4.74  3.87 - 5.11 MIL/uL   Hemoglobin 14.9  12.0 - 15.0 g/dL   HCT 16.1  09.6 - 04.5 %   MCV 90.9  78.0 - 100.0 fL   MCH 31.4  26.0 - 34.0 pg   MCHC 34.6  30.0 - 36.0 g/dL   RDW 40.9  81.1 - 91.4 %   Platelets 218  150 - 400 K/uL  BASIC METABOLIC PANEL      Result Value Range   Sodium 138  135 - 145 mEq/L   Potassium 3.5  3.5 - 5.1 mEq/L   Chloride 98  96 - 112 mEq/L   CO2 27  19 - 32 mEq/L   Glucose, Bld 246 (*) 70 - 99 mg/dL   BUN 9  6 - 23 mg/dL   Creatinine, Ser 7.82  0.50 - 1.10 mg/dL   Calcium 9.8  8.4 - 95.6 mg/dL   GFR calc non Af Amer 81 (*) >90 mL/min   GFR calc Af Amer >90  >90 mL/min  D-DIMER, QUANTITATIVE      Result Value Range   D-Dimer, Quant 0.36  0.00 - 0.48 ug/mL-FEU  PRO B NATRIURETIC PEPTIDE      Result Value Range   Pro B Natriuretic peptide (BNP) 1616.0 (*) 0 - 450 pg/mL  POCT I-STAT TROPONIN I      Result Value Range   Troponin i, poc 0.00  0.00 - 0.08 ng/mL   Comment 3           CG4 I-STAT (LACTIC ACID)      Result Value Range   Lactic Acid, Venous 2.61 (*) 0.5 - 2.2 mmol/L   Dg Chest Port 1 View  05/25/2012   *RADIOLOGY REPORT*  Clinical Data: Chest pain  PORTABLE CHEST - 1 VIEW  Comparison: 04/16/2012  Findings: Cardiomegaly without CHF or pneumonia.  Stable background COPD/emphysema with apical scarring.  Lungs remain hyperinflated. No focal airspace process, edema, or effusion.  Degenerative changes of the spine.  IMPRESSION: Cardiomegaly without CHF or pneumonia  Stable COPD/emphysema with apical scarring   Original Report Authenticated By: Judie Petit.  Shick, M.D.       1. Shortness of breath   2. Chest pain       MDM  Patient with chest pain and abdominal pain. Patient states that the chest pain radiated to the abdomen. She has not actively having any chest pain. Placed on oxygen in the emergency department. I discussed the patient with Dr. Rulon Abide, who is currently bedside see the patient.  8:14 AM Patient seen by and discussed with Dr. Rulon Abide.  D-dimer is negative, PE is unlikely.  Patient has negative troponin, but will likely need to be admitted for ACS rule out.  Additionally, the patient has an elevated BNP, which could indicate new CHF.  Will admit the patient to the hospital for further evaluation.    Discussed with Dr. Cena Benton, from the [facility], who will admit the patient.        Roxy Horseman, PA-C 05/25/12 [phone]

## 2012-05-26 ENCOUNTER — Encounter (HOSPITAL_COMMUNITY): Payer: Self-pay | Admitting: Cardiology

## 2012-05-26 DIAGNOSIS — I214 Non-ST elevation (NSTEMI) myocardial infarction: Principal | ICD-10-CM

## 2012-05-26 LAB — BASIC METABOLIC PANEL
Calcium: 9.5 mg/dL (ref 8.4–10.5)
GFR calc Af Amer: >90 mL/min (ref >90)
GFR calc non Af Amer: 80 mL/min — ABNORMAL LOW (ref >90)
Glucose, Bld: 207 mg/dL — ABNORMAL HIGH (ref 70–99)
Potassium: 3.5 mEq/L (ref 3.5–5.1)
Sodium: 135 mEq/L (ref 135–145)

## 2012-05-26 LAB — CBC
MCH: 30.2 pg (ref 26.0–34.0)
Platelets: 191 10*3/uL (ref 150–400)
RBC: 4.34 MIL/uL (ref 3.87–5.11)
WBC: 5.2 10*3/uL (ref 4.0–10.5)

## 2012-05-26 LAB — TROPONIN I
Troponin I: 0.36 ng/mL (ref <0.30)
Troponin I: 0.38 ng/mL (ref <0.30)
Troponin I: 0.65 ng/mL (ref <0.30)

## 2012-05-26 LAB — APTT: aPTT: 33 seconds (ref 24–37)

## 2012-05-26 LAB — GLUCOSE, CAPILLARY: Glucose-Capillary: 222 mg/dL — ABNORMAL HIGH (ref 70–99)

## 2012-05-26 LAB — PROTIME-INR: INR: 1.77 — ABNORMAL HIGH (ref 0.00–1.49)

## 2012-05-26 MED ORDER — ASPIRIN 81 MG PO CHEW
324.0000 mg | CHEWABLE_TABLET | ORAL | Status: DC
  Filled 2012-05-26: qty 4

## 2012-05-26 MED ORDER — HEPARIN (PORCINE) IN NACL 100-0.45 UNIT/ML-% IJ SOLN
650.0000 [IU]/h | INTRAMUSCULAR | Status: DC
  Administered 2012-05-26: 650 [IU]/h via INTRAVENOUS
  Filled 2012-05-26: qty 250

## 2012-05-26 MED ORDER — ASPIRIN 81 MG PO CHEW
324.0000 mg | CHEWABLE_TABLET | ORAL | Status: AC
  Administered 2012-05-28: 324 mg via ORAL
  Filled 2012-05-26: qty 4

## 2012-05-26 MED ORDER — SODIUM CHLORIDE 0.9 % IV SOLN
1.0000 mL/kg/h | INTRAVENOUS | Status: DC
  Administered 2012-05-28: 1 mL/kg/h via INTRAVENOUS

## 2012-05-26 MED ORDER — SODIUM CHLORIDE 0.9 % IJ SOLN: 3.0000 mL | INTRAMUSCULAR | Status: DC | PRN

## 2012-05-26 MED ORDER — DIAZEPAM 2 MG PO TABS
2.0000 mg | ORAL_TABLET | ORAL | Status: AC
  Administered 2012-05-28: 2 mg via ORAL
  Filled 2012-05-26: qty 1

## 2012-05-26 MED ORDER — ASPIRIN 325 MG PO TABS
325.0000 mg | ORAL_TABLET | Freq: Every day | ORAL | Status: DC
  Administered 2012-05-26 – 2012-05-27 (×2): 325 mg via ORAL
  Filled 2012-05-26 (×3): qty 1

## 2012-05-26 MED ORDER — ATORVASTATIN CALCIUM 80 MG PO TABS
80.0000 mg | ORAL_TABLET | Freq: Every day | ORAL | Status: DC
  Administered 2012-05-26 – 2012-05-28 (×3): 80 mg via ORAL
  Filled 2012-05-26 (×5): qty 1

## 2012-05-26 MED ORDER — SODIUM CHLORIDE 0.9 % IV SOLN: 1.0000 mL/kg/h | INTRAVENOUS | Status: DC

## 2012-05-26 MED ORDER — SODIUM CHLORIDE 0.9 % IV SOLN: 250.0000 mL | INTRAVENOUS | Status: DC | PRN

## 2012-05-26 MED ORDER — SODIUM CHLORIDE 0.9 % IJ SOLN: 3.0000 mL | Freq: Two times a day (BID) | INTRAMUSCULAR | Status: DC
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
## 2012-05-26 NOTE — ED Provider Notes (Signed)
Medical screening examination/treatment/procedure(s) were conducted as a shared visit with non-physician practitioner(s) and myself.  I personally evaluated the patient during the encounter Jones Skene, M.D.  Michelle Herrera is a 79 y.o. female presenting to the ED with chest pain, abdominal pain, shortness of breath. About 2 or hours prior to arrival she woke up with chest pain radiating to her abdomen, it is been moderate to severe and dull. She denies any pain at this time. She's having some shortness of breath now. She took an atenolol and did not help her symptoms. She's not had a productive cough, she's had some lower extremity edema, she denies any fevers, chills, nausea vomiting, diaphoresis, lower abdominal pain, dysuria.  At least 10pt or greater review of systems completed and are negative except where specified in the HPI.  VITAL SIGNS:   Filed Vitals:   05/26/12 0535  BP: 135/83  Pulse: 61  Temp: 98 F (36.7 C)  Resp: 16   CONSTITUTIONAL: Awake, oriented, appears non-toxic HENT: Atraumatic, normocephalic, oral mucosa pink and moist, airway patent. Nares patent without drainage. External ears normal. EYES: Conjunctiva clear, EOMI, PERRLA NECK: Trachea midline, non-tender, supple CARDIOVASCULAR: Normal heart rate, Normal rhythm, No murmurs, rubs, gallops PULMONARY/CHEST: Clear to auscultation, no rhonchi, wheezes, or rales. Symmetrical breath sounds. Non-tender. ABDOMINAL: Non-distended, soft, non-tender - no rebound or guarding.  BS normal. NEUROLOGIC: Non-focal, moving all four extremities, no gross sensory or motor deficits. EXTREMITIES: No clubbing, cyanosis. 1+ lower extremity pitting edema SKIN: Warm, Dry, No erythema, No rash  MDM: Patient presents to the ED with chest pains - patient has history of hypertension, diabetes with an enlarged heart chest x-ray shows enlarged cardiac silhouette as well as COPD type changes. Patient's BNP is elevated, this is greatly  higher than it has been in the past.  Concern for acute coronary syndrome versus heart failure. Patient to be admitted    Jones Skene, MD 05/26/12 [phone]

## 2012-05-26 NOTE — Consult Note (Signed)
HPI: 79 year old female with past medical history of permanent atrial fibrillation, prior CVA, prior renal infarct, diabetes mellitus, hypertension, hyperlipidemia who I met to evaluate for a non-ST elevation myocardial infarction. Patient is followed closely by Dr. Ladona Ridgel. Last echocardiogram in January of 2013 showed an ejection fraction of 40-45%. There was mild to moderate mitral regurgitation. There was moderate to severe biatrial enlargement. The right ventricle was moderate to severely dilated with reduced function. There was moderate tricuspid regurgitation. The patient does have mild dyspnea on exertion but no orthopnea, PND, palpitations or syncope. Occasional mild pedal edema. Yesterday morning at approximately 3 AM she awoke with epigastric pain described as a pressure. There was no radiation. There was associated nausea, dyspnea and diaphoresis. The pain was not pleuritic or positional. It lasted 30 minutes and resolved spontaneously. She has been admitted and troponins are elevated. Cardiology is asked to evaluate.  Medications Prior to Admission  Medication Sig Dispense Refill  . atenolol (TENORMIN) 50 MG tablet Take 50 mg by mouth 2 (two) times daily.      [name] gabapentin (NEURONTIN) 100 MG capsule Take 100 mg by mouth 3 (three) times daily.      [name] glipiZIDE (GLUCOTROL) 10 MG tablet Take 10 mg by mouth 2 (two) times daily before a meal.      . metFORMIN (GLUCOPHAGE) 500 MG tablet Take 1,000 mg by mouth 2 (two) times daily with a meal.      . Rivaroxaban (XARELTO) 20 MG TABS Take 20 mg by mouth every evening.      . vitamin B-12 (CYANOCOBALAMIN) 1000 MCG tablet Take 1,000 mcg by mouth every morning.         Allergies  Allergen Reactions  . Azithromycin     REACTION: pt states INTOL to ZPak  . Flomax (Tamsulosin Hcl) Swelling    swelling  . Pioglitazone     edema  . Pregabalin     REACTION: pt states INTOL to Lyrica  . Sulfonamide Derivatives Swelling    REACTION: SWELLING     Past Medical History  Diagnosis Date  . HTN (hypertension)   . Chronic systolic heart failure   . Pure hypercholesterolemia   . Type II or unspecified type diabetes mellitus without mention of complication, not stated as uncontrolled   . Anxiety state, unspecified   . Diaphragmatic hernia without mention of obstruction or gangrene   . Benign neoplasm of colon   . Mixed incontinence urge and stress (female)(female)   . Osteoarthrosis, unspecified whether generalized or localized, unspecified site   . Lumbago   . Osteoporosis, unspecified   . Memory loss   . Renal infarction     01/2011 in setting of low INR  . History of stroke   . AF (atrial fibrillation)     Past Surgical History  Procedure Laterality Date  . Cholecystectomy    . Rt.leg surgery  11/2010  . Cataract surgery/both eyes      01/2012 left eye---02/2012 right eye    History   Social History  . Marital Status: Widowed    Spouse Name: N/A    Number of Children: 6  . Years of Education: N/A   Occupational History  .     Social History Main Topics  . Smoking status: Never Smoker   . Smokeless tobacco: Never Used  . Alcohol Use: No  . Drug Use: No  . Sexually Active: No   Other Topics Concern  . Not on file   Social History  Narrative   1 sister--in poor health   1 brother hx of kidney stones and heart problems    Family History  Problem Relation Age of Onset  . Stomach cancer Father   . Pneumonia Mother   . CAD Brother     ROS:  no fevers or chills, productive cough, hemoptysis, dysphasia, odynophagia, melena, hematochezia, dysuria, hematuria, rash, seizure activity, orthopnea, PND, pedal edema, claudication. Remaining systems are negative.  Physical Exam:   Blood pressure 135/83, pulse 61, temperature 98 F (36.7 C), temperature source Oral, resp. rate 16, height 5\' 5"  (1.651 m), weight 122 lb 3.2 oz (55.43 kg), SpO2 98.00%.  General:  Well developed/well nourished in NAD Skin  warm/dry Patient not depressed No peripheral clubbing Back-normal HEENT-normal/normal eyelids Neck supple/normal carotid upstroke bilaterally; no bruits; no JVD; no thyromegaly chest - CTA/ normal expansion CV - irregular/normal S1 and S2; no murmurs, rubs or gallops;  PMI nondisplaced Abdomen -NT/ND, no HSM, no mass, + bowel sounds, no bruit 2+ femoral pulses, no bruits Ext-no edema, chords, 1+ DP Neuro-grossly nonfocal  ECG atrial fibrillation with PVCs or aberrantly conducted beats. Lateral infarct. Nonspecific ST changes.  Results for orders placed during the hospital encounter of 05/25/12 (from the past 48 hour(s))  CBC     Status: None   Collection Time    05/25/12  6:25 AM      Result Value Range   WBC 5.6  4.0 - 10.5 K/uL   RBC 4.74  3.87 - 5.11 MIL/uL   Hemoglobin 14.9  12.0 - 15.0 g/dL   HCT 91.4  78.2 - 95.6 %   MCV 90.9  78.0 - 100.0 fL   MCH 31.4  26.0 - 34.0 pg   MCHC 34.6  30.0 - 36.0 g/dL   RDW 21.3  08.6 - 57.8 %   Platelets 218  150 - 400 K/uL  BASIC METABOLIC PANEL     Status: Abnormal   Collection Time    05/25/12  6:25 AM      Result Value Range   Sodium 138  135 - 145 mEq/L   Potassium 3.5  3.5 - 5.1 mEq/L   Chloride 98  96 - 112 mEq/L   CO2 27  19 - 32 mEq/L   Glucose, Bld 246 (*) 70 - 99 mg/dL   BUN 9  6 - 23 mg/dL   Creatinine, Ser 4.69  0.50 - 1.10 mg/dL   Calcium 9.8  8.4 - 62.9 mg/dL   GFR calc non Af Amer 81 (*) >90 mL/min   GFR calc Af Amer >90  >90 mL/min   Comment:            The eGFR has been calculated     using the CKD EPI equation.     This calculation has not been     validated in all clinical     situations.     eGFR's persistently     <90 mL/min signify     possible Chronic Kidney Disease.  D-DIMER, QUANTITATIVE     Status: None   Collection Time    05/25/12  6:25 AM      Result Value Range   D-Dimer, Quant 0.36  0.00 - 0.48 ug/mL-FEU   Comment:            AT THE INHOUSE ESTABLISHED CUTOFF     VALUE OF 0.48 ug/mL FEU,      THIS ASSAY HAS BEEN DOCUMENTED     IN THE LITERATURE  TO HAVE     A SENSITIVITY AND NEGATIVE     PREDICTIVE VALUE OF AT LEAST     98 TO 99%.  THE TEST RESULT     SHOULD BE CORRELATED WITH     AN ASSESSMENT OF THE CLINICAL     PROBABILITY OF DVT / VTE.  PRO B NATRIURETIC PEPTIDE     Status: Abnormal   Collection Time    05/25/12  6:25 AM      Result Value Range   Pro B Natriuretic peptide (BNP) 1616.0 (*) 0 - 450 pg/mL  POCT I-STAT TROPONIN I     Status: None   Collection Time    05/25/12  6:30 AM      Result Value Range   Troponin i, poc 0.00  0.00 - 0.08 ng/mL   Comment 3            Comment: Due to the release kinetics of cTnI,     a negative result within the first hours     of the onset of symptoms does not rule out     myocardial infarction with certainty.     If myocardial infarction is still suspected,     repeat the test at appropriate intervals.  CG4 I-STAT (LACTIC ACID)     Status: Abnormal   Collection Time    05/25/12  6:59 AM      Result Value Range   Lactic Acid, Venous 2.61 (*) 0.5 - 2.2 mmol/L  MAGNESIUM     Status: None   Collection Time    05/25/12 10:45 AM      Result Value Range   Magnesium 1.6  1.5 - 2.5 mg/dL  PHOSPHORUS     Status: None   Collection Time    05/25/12 10:45 AM      Result Value Range   Phosphorus 3.2  2.3 - 4.6 mg/dL  TSH     Status: None   Collection Time    05/25/12 10:45 AM      Result Value Range   TSH 2.820  0.350 - 4.500 uIU/mL  TROPONIN I     Status: None   Collection Time    05/25/12 10:45 AM      Result Value Range   Troponin I <0.30  <0.30 ng/mL   Comment:            Due to the release kinetics of cTnI,     a negative result within the first hours     of the onset of symptoms does not rule out     myocardial infarction with certainty.     If myocardial infarction is still suspected,     repeat the test at appropriate intervals.  GLUCOSE, CAPILLARY     Status: Abnormal   Collection Time    05/25/12 11:43 AM       Result Value Range   Glucose-Capillary 216 (*) 70 - 99 mg/dL  TROPONIN I     Status: Abnormal   Collection Time    05/25/12  4:02 PM      Result Value Range   Troponin I 0.31 (*) <0.30 ng/mL   Comment:            Due to the release kinetics of cTnI,     a negative result within the first hours     of the onset of symptoms does not rule out     myocardial infarction with certainty.     If  myocardial infarction is still suspected,     repeat the test at appropriate intervals.     CRITICAL RESULT CALLED TO, READ BACK BY AND VERIFIED WITH:     J HUFF RN 1641 05/25/12 A NAVARRO  GLUCOSE, CAPILLARY     Status: Abnormal   Collection Time    05/25/12  4:04 PM      Result Value Range   Glucose-Capillary 116 (*) 70 - 99 mg/dL  GLUCOSE, CAPILLARY     Status: Abnormal   Collection Time    05/25/12 10:16 PM      Result Value Range   Glucose-Capillary 195 (*) 70 - 99 mg/dL  TROPONIN I     Status: Abnormal   Collection Time    05/25/12 11:30 PM      Result Value Range   Troponin I 0.65 (*) <0.30 ng/mL   Comment:            Due to the release kinetics of cTnI,     a negative result within the first hours     of the onset of symptoms does not rule out     myocardial infarction with certainty.     If myocardial infarction is still suspected,     repeat the test at appropriate intervals.     CRITICAL VALUE NOTED.  VALUE IS CONSISTENT WITH PREVIOUSLY REPORTED AND CALLED VALUE.  BASIC METABOLIC PANEL     Status: Abnormal   Collection Time    05/26/12  5:12 AM      Result Value Range   Sodium 135  135 - 145 mEq/L   Potassium 3.5  3.5 - 5.1 mEq/L   Chloride 96  96 - 112 mEq/L   CO2 29  19 - 32 mEq/L   Glucose, Bld 207 (*) 70 - 99 mg/dL   BUN 12  6 - 23 mg/dL   Creatinine, Ser 8.11  0.50 - 1.10 mg/dL   Calcium 9.5  8.4 - 91.4 mg/dL   GFR calc non Af Amer 80 (*) >90 mL/min   GFR calc Af Amer >90  >90 mL/min   Comment:            The eGFR has been calculated     using the CKD EPI  equation.     This calculation has not been     validated in all clinical     situations.     eGFR's persistently     <90 mL/min signify     possible Chronic Kidney Disease.  CBC     Status: None   Collection Time    05/26/12  5:12 AM      Result Value Range   WBC 5.2  4.0 - 10.5 K/uL   RBC 4.34  3.87 - 5.11 MIL/uL   Hemoglobin 13.1  12.0 - 15.0 g/dL   HCT 78.2  95.6 - 21.3 %   MCV 90.3  78.0 - 100.0 fL   MCH 30.2  26.0 - 34.0 pg   MCHC 33.4  30.0 - 36.0 g/dL   RDW 08.6  57.8 - 46.9 %   Platelets 191  150 - 400 K/uL  GLUCOSE, CAPILLARY     Status: Abnormal   Collection Time    05/26/12  7:50 AM      Result Value Range   Glucose-Capillary 223 (*) 70 - 99 mg/dL    Dg Chest Port 1 View  05/25/2012   *RADIOLOGY REPORT*  Clinical Data: Chest pain  PORTABLE  CHEST - 1 VIEW  Comparison: 04/16/2012  Findings: Cardiomegaly without CHF or pneumonia.  Stable background COPD/emphysema with apical scarring.  Lungs remain hyperinflated. No focal airspace process, edema, or effusion.  Degenerative changes of the spine.  IMPRESSION: Cardiomegaly without CHF or pneumonia  Stable COPD/emphysema with apical scarring   Original Report Authenticated By: Judie Petit. Miles Costain, M.D.    Assessment/Plan 1 non-ST elevation myocardial infarction-the patient has ruled in with a positive troponin. She will require cardiac catheterization. The risks and benefits were discussed and the patient agrees to proceed. Discontinue xeralto. Treat with aspirin, heparin and statin. Continue beta blocker. 2 permanent atrial fibrillation-continue atenolol for rate control. She has had a prior CVA and renal infarct that was presumed embolic. Continue heparin until catheterization performed. Once her catheterization is complete and she is revascularized as needed we'll need to resume xeralto. 3 diabetes mellitus-follow CBGs. 4 hypertension-continue atenolol. I will hold her diuretic giving upcoming catheterization. Add ACE inhibitor  following catheterization given history of mildly reduced LV function and diabetes mellitus. 5 hyperlipidemia-add statin.  Olga Millers MD 05/26/2012, 8:23 AM

## 2012-05-26 NOTE — Progress Notes (Signed)
Patient had a 2.03 second pause at 19. Patient was asymptomatic and lying in bed. NP, Lynch, was paged and no new orders were placed. RN will continue to monitor HR and rhythm closely.

## 2012-05-26 NOTE — Progress Notes (Signed)
ANTICOAGULATION CONSULT NOTE - Initial Consult  Pharmacy Consult for Heparin Indication: chest pain/ACS  Allergies  Allergen Reactions  . Azithromycin     REACTION: pt states INTOL to ZPak  . Flomax (Tamsulosin Hcl) Swelling    swelling  . Pioglitazone     edema  . Pregabalin     REACTION: pt states INTOL to Lyrica  . Sulfonamide Derivatives Swelling    REACTION: SWELLING    Patient Measurements: Height: 5\' 5"  (165.1 cm) Weight: 122 lb 3.2 oz (55.43 kg) IBW/kg (Calculated) : 57 Heparin Dosing Weight: 55.4kg  Vital Signs: Temp: 98 F (36.7 C) (05/17 0535) Temp src: Oral (05/17 0535) BP: 135/83 mmHg (05/17 0535) Pulse Rate: 61 (05/17 0535)  Labs:  Recent Labs  05/25/12 0625 05/25/12 1045 05/25/12 1602 05/25/12 2330 05/26/12 0512  HGB 14.9  --   --   --  13.1  HCT 43.1  --   --   --  39.2  PLT 218  --   --   --  191  CREATININE 0.67  --   --   --  0.70  TROPONINI  --  <0.30 0.31* 0.65*  --     Estimated Creatinine Clearance: 49.9 ml/min (by C-G formula based on Cr of 0.7).   Medical History: Past Medical History  Diagnosis Date  . HTN (hypertension)   . Chronic systolic heart failure   . Pure hypercholesterolemia   . Type II or unspecified type diabetes mellitus without mention of complication, not stated as uncontrolled   . Anxiety state, unspecified   . Diaphragmatic hernia without mention of obstruction or gangrene   . Benign neoplasm of colon   . Mixed incontinence urge and stress (female)(female)   . Osteoarthrosis, unspecified whether generalized or localized, unspecified site   . Lumbago   . Osteoporosis, unspecified   . Memory loss   . Renal infarction     01/2011 in setting of low INR  . History of stroke   . AF (atrial fibrillation)     Medications:  Scheduled:  . aspirin  325 mg Oral Daily  . atenolol  50 mg Oral BID  . atorvastatin  80 mg Oral q1800  . gabapentin  100 mg Oral TID  . glipiZIDE  10 mg Oral BID AC  . insulin aspart   0-15 Units Subcutaneous TID WC  . pantoprazole  40 mg Oral Daily  . sodium chloride  3 mL Intravenous Q12H  . vitamin B-12  1,000 mcg Oral q morning - 10a   Infusions:   PRN: acetaminophen  Assessment: 81 with Hx of atrial fibrillation, prior CVA, DM, HTN, and hyperlipidemia.  This admission patient has elevated troponin level, with symptoms of CP, SOB, epigastric pain with pressure. Cardiology states patient requires cardiac cath and will start heparin.  After cardiac cath, plan to go back to Xarelto as taken PTA. Goal of Therapy:  Heparin level 0.3-0.7 units/ml Monitor platelets by anticoagulation protocol: Yes   Plan:  Since patient was on Xarelto and received the last dose on 5/16, will begin heparin 24 hours after this last dose --5/17 at 1700. Begin Heparin at 650 units/hour. Check HL 6 hours after starting heparin and then [facility]. Continue to monitor H/H and platelets. Loletta Specter 05/26/2012,9:14 AM

## 2012-05-26 NOTE — Progress Notes (Signed)
Patient had a critical troponin of 0.65. This value resulted at 2330. Night RN was never informed of the critical value resulting. During report day RN instructed the writer to page cardiology with the final troponin result. Writer called Adult nurse cardiology at [phone] and spoke to [place] on call.  Michelle Herrera was not going to address anything at this point since their is no proof of cardiology being consulted  NP, Lynch was made aware of the elevated troponin and no new orders were placed.

## 2012-05-27 LAB — COMPREHENSIVE METABOLIC PANEL
ALT: 8 U/L (ref 0–35)
AST: 14 U/L (ref 0–37)
Albumin: 3.3 g/dL — ABNORMAL LOW (ref 3.5–5.2)
Alkaline Phosphatase: 74 U/L (ref 39–117)
Chloride: 97 mEq/L (ref 96–112)
Potassium: 3.4 mEq/L — ABNORMAL LOW (ref 3.5–5.1)
Total Bilirubin: 1 mg/dL (ref 0.3–1.2)

## 2012-05-27 LAB — CBC
HCT: 38 % (ref 36.0–46.0)
Platelets: 186 10*3/uL (ref 150–400)
RDW: 12.9 % (ref 11.5–15.5)
WBC: 4.9 10*3/uL (ref 4.0–10.5)

## 2012-05-27 LAB — GLUCOSE, CAPILLARY
Glucose-Capillary: 249 mg/dL — ABNORMAL HIGH (ref 70–99)
Glucose-Capillary: 277 mg/dL — ABNORMAL HIGH (ref 70–99)
Glucose-Capillary: 87 mg/dL (ref 70–99)

## 2012-05-27 MED ORDER — CLOPIDOGREL BISULFATE 75 MG PO TABS
75.0000 mg | ORAL_TABLET | Freq: Every day | ORAL | Status: DC
  Administered 2012-05-27 – 2012-05-29 (×3): 75 mg via ORAL
  Filled 2012-05-27 (×4): qty 1

## 2012-05-27 MED ORDER — POTASSIUM CHLORIDE CRYS ER 20 MEQ PO TBCR
40.0000 meq | EXTENDED_RELEASE_TABLET | Freq: Once | ORAL | Status: AC
  Administered 2012-05-27: 40 meq via ORAL
  Filled 2012-05-27: qty 2

## 2012-05-27 MED ORDER — HEPARIN (PORCINE) IN NACL 100-0.45 UNIT/ML-% IJ SOLN
500.0000 [IU]/h | INTRAMUSCULAR | Status: DC
  Filled 2012-05-27: qty 250

## 2012-05-27 NOTE — Progress Notes (Signed)
ANTICOAGULATION CONSULT NOTE - Follow Up Consult  Pharmacy Consult for heparin Indication: chest pain/ACS  Allergies  Allergen Reactions  . Azithromycin     REACTION: pt states INTOL to ZPak  . Flomax (Tamsulosin Hcl) Swelling    swelling  . Pioglitazone     edema  . Pregabalin     REACTION: pt states INTOL to Lyrica  . Sulfonamide Derivatives Swelling    REACTION: SWELLING    Patient Measurements: Height: 5\' 5"  (165.1 cm) Weight: 123 lb 7.3 oz (56 kg) IBW/kg (Calculated) : 57 Heparin Dosing Weight:   Vital Signs: Temp: 98.1 F (36.7 C) (05/18 0417) Temp src: Oral (05/18 0417) BP: 128/74 mmHg (05/18 0417) Pulse Rate: 75 (05/18 0417)  Labs:  Recent Labs  05/25/12 0625  05/26/12 0512 05/26/12 1001 05/26/12 1015 05/26/12 1616 05/26/12 2320  HGB 14.9  --  13.1  --   --   --   --   HCT 43.1  --  39.2  --   --   --   --   PLT 218  --  191  --   --   --   --   APTT  --   --   --  33  --   --   --   LABPROT  --   --   --  20.0*  --   --   --   INR  --   --   --  1.77*  --   --   --   HEPARINUNFRC  --   --   --   --   --   --  >2.00*  CREATININE 0.67  --  0.70  --   --   --   --   TROPONINI  --   < >  --   --  0.38* 0.36* <0.30  < > = values in this interval not displayed.  Estimated Creatinine Clearance: 50.4 ml/min (by C-G formula based on Cr of 0.7).   Medications:  Infusions:  . [START ON 05/28/2012] sodium chloride    . heparin 500 Units/hr (05/27/12 0315)    Assessment: Patient with high heparin level.  RN confirmed level drawn correctly.    Goal of Therapy:  Heparin level 0.3-0.7 units/ml Monitor platelets by anticoagulation protocol: Yes   Plan:  Hold heparin until 0315, then resume at 500 units/hr.  Recheck heparin level at [address] Michelle Herrera 05/27/2012,4:45 AM

## 2012-05-27 NOTE — Progress Notes (Signed)
ANTICOAGULATION CONSULT NOTE - Follow Up Consult  Pharmacy Consult for heparin Indication: chest pain/ACS  Allergies  Allergen Reactions  . Peanut-Containing Drug Products Anaphylaxis and Swelling  . Azithromycin     REACTION: pt states INTOL to ZPak  . Flomax (Tamsulosin Hcl) Swelling    swelling  . Pioglitazone     edema  . Pregabalin     REACTION: pt states INTOL to Lyrica  . Sulfonamide Derivatives Swelling    REACTION: SWELLING    Patient Measurements: Height: 5\' 5"  (165.1 cm) Weight: 123 lb 7.3 oz (56 kg) IBW/kg (Calculated) : 57 Heparin Dosing Weight:   Vital Signs: Temp: 98.1 F (36.7 C) (05/18 0417) Temp src: Oral (05/18 0417) BP: 112/56 mmHg (05/18 0914) Pulse Rate: 77 (05/18 0914)  Labs:  Recent Labs  05/25/12 0625  05/26/12 0512 05/26/12 1001 05/26/12 1015 05/26/12 1616 05/26/12 2320 05/27/12 0517 05/27/12 1150  HGB 14.9  --  13.1  --   --   --   --  13.1  --   HCT 43.1  --  39.2  --   --   --   --  38.0  --   PLT 218  --  191  --   --   --   --  186  --   APTT  --   --   --  33  --   --   --  47*  --   LABPROT  --   --   --  20.0*  --   --   --   --   --   INR  --   --   --  1.77*  --   --   --   --   --   HEPARINUNFRC  --   --   --   --   --   --  >2.00*  --  0.73*  CREATININE 0.67  --  0.70  --   --   --   --  0.72  --   TROPONINI  --   < >  --   --  0.38* 0.36* <0.30  --   --   < > = values in this interval not displayed.  Estimated Creatinine Clearance: 50.4 ml/min (by C-G formula based on Cr of 0.72).   Medications:  Infusions:  . [START ON 05/28/2012] sodium chloride    . heparin 500 Units/hr (05/27/12 0315)    Assessment:  79 yo F with NSTEMI on IV heparin.  Cardiac Cath scheduled for 5/19 am.   Patient's heparin level this afternoon (0.73) remains slightly above goal range on a  heparin gtt rate of 500 units/hr.  This is likely continued effects from the Xarelto for A.Fib (last dose 5/16). CBC and renal function remain wnl.  No  bleeding/complications reported     Goal of Therapy:  Heparin level 0.3-0.7 units/ml Monitor platelets by anticoagulation protocol: Yes   Plan:  1.) Continue Heparin at current rate of 500 units/hr (5 ml/hr).  2.) Repeat heparin level at 8pm tonight to ensure heparin is therapeutic and HL is not effects from xarelto.  3.) f/u plans for cardiac cath and resuming xarelto post cath  Erma Raiche, Loma Messing PharmD Pager #: [phone] 2:20 PM 05/27/2012

## 2012-05-27 NOTE — Progress Notes (Signed)
   Subjective:  Denies CP or dyspnea   Objective:  Filed Vitals:   05/26/12 1320 05/26/12 2129 05/27/12 0417 05/27/12 0424  BP: 121/68 105/56 128/74   Pulse: 70 74 75   Temp: 98.8 F (37.1 C) 97.7 F (36.5 C) 98.1 F (36.7 C)   TempSrc: Oral Oral Oral   Resp: 17 16 16    Height:      Weight:    123 lb 7.3 oz (56 kg)  SpO2: 99% 99% 96%     Intake/Output from previous day:  Intake/Output Summary (Last 24 hours) at 05/27/12 0606 Last data filed at 05/27/12 0150  Gross per 24 hour  Intake 894.71 ml  Output   1050 ml  Net -155.29 ml    Physical Exam: Physical exam: Well-developed well-nourished in no acute distress.  Skin is warm and dry.  HEENT is normal.  Neck is supple. Chest is clear to auscultation with normal expansion.  Cardiovascular exam is irregular Abdominal exam nontender or distended. No masses palpated. Extremities show no edema. neuro grossly intact    Lab Results: Basic Metabolic Panel:  Recent Labs  81/19/14 0625 05/25/12 1045 05/26/12 0512  NA 138  --  135  K 3.5  --  3.5  CL 98  --  96  CO2 27  --  29  GLUCOSE 246*  --  207*  BUN 9  --  12  CREATININE 0.67  --  0.70  CALCIUM 9.8  --  9.5  MG  --  1.6  --   PHOS  --  3.2  --    CBC:  Recent Labs  05/25/12 0625 05/26/12 0512  WBC 5.6 5.2  HGB 14.9 13.1  HCT 43.1 39.2  MCV 90.9 90.3  PLT 218 191   Cardiac Enzymes:  Recent Labs  05/26/12 1015 05/26/12 1616 05/26/12 2320  TROPONINI 0.38* 0.36* <0.30     Assessment/Plan:  1 non-ST elevation myocardial infarction-the patient has ruled in with a positive troponin. She will require cardiac catheterization. The risks and benefits were discussed and the patient agrees to proceed. Treat with aspirin, heparin and statin. Continue beta blocker. Add plavix. 2 permanent atrial fibrillation-continue atenolol for rate control. She has had a prior CVA and renal infarct that was presumed embolic. Continue heparin until catheterization  performed. Once her catheterization is complete and she is revascularized as needed we'll need to resume xeralto.  3 diabetes mellitus-follow CBGs.  4 hypertension-continue atenolol. Continue to hold her diuretic giving upcoming catheterization. Add ACE inhibitor following catheterization given history of mildly reduced LV function and diabetes mellitus.  5 hyperlipidemia-continue statin.   Michelle Herrera 05/27/2012, 6:06 AM
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
## 2012-05-27 NOTE — Progress Notes (Signed)
ANTICOAGULATION CONSULT NOTE - Follow Up Consult  Pharmacy Consult for Heparin Indication: chest pain/ACS  Allergies  Allergen Reactions  . Peanut-Containing Drug Products Anaphylaxis and Swelling  . Azithromycin     REACTION: pt states INTOL to ZPak  . Flomax (Tamsulosin Hcl) Swelling    swelling  . Pioglitazone     edema  . Pregabalin     REACTION: pt states INTOL to Lyrica  . Sulfonamide Derivatives Swelling    REACTION: SWELLING    Patient Measurements: Height: 5\' 5"  (165.1 cm) Weight: 123 lb 7.3 oz (56 kg) IBW/kg (Calculated) : 57 Heparin Dosing Weight:   Vital Signs: Temp: 98.7 F (37.1 C) (05/18 1300) Temp src: Oral (05/18 1300) BP: 134/60 mmHg (05/18 1724) Pulse Rate: 69 (05/18 1724)  Labs:  Recent Labs  05/25/12 0625  05/26/12 0512 05/26/12 1001 05/26/12 1015 05/26/12 1616 05/26/12 2320 05/27/12 0517 05/27/12 1150 05/27/12 1953  HGB 14.9  --  13.1  --   --   --   --  13.1  --   --   HCT 43.1  --  39.2  --   --   --   --  38.0  --   --   PLT 218  --  191  --   --   --   --  186  --   --   APTT  --   --   --  33  --   --   --  47*  --   --   LABPROT  --   --   --  20.0*  --   --   --   --   --   --   INR  --   --   --  1.77*  --   --   --   --   --   --   HEPARINUNFRC  --   --   --   --   --   --  >2.00*  --  0.73* 0.64  CREATININE 0.67  --  0.70  --   --   --   --  0.72  --   --   TROPONINI  --   < >  --   --  0.38* 0.36* <0.30  --   --   --   < > = values in this interval not displayed.  Estimated Creatinine Clearance: 50.4 ml/min (by C-G formula based on Cr of 0.72).   Medications:  Infusions:  . [START ON 05/28/2012] sodium chloride    . heparin 500 Units/hr (05/27/12 0315)    Assessment: Patient with heparin level at goal now.  Will continue at current rate and recheck with am labs Goal of Therapy:  Heparin level 0.3-0.7 units/ml Monitor platelets by anticoagulation protocol: Yes   Plan:   Will continue at current rate and recheck  with am labs  Aleene Davidson Crowford 05/27/2012,9:53 PM

## 2012-05-28 ENCOUNTER — Encounter (HOSPITAL_COMMUNITY)
Admission: EM | Disposition: A | Discharge status: Home / Self Care | Payer: Self-pay | Source: Home / Self Care | Attending: Family Medicine

## 2012-05-28 DIAGNOSIS — E1149 Type 2 diabetes mellitus with other diabetic neurological complication: Secondary | ICD-10-CM

## 2012-05-28 DIAGNOSIS — Z7901 Long term (current) use of anticoagulants: Secondary | ICD-10-CM

## 2012-05-28 DIAGNOSIS — I214 Non-ST elevation (NSTEMI) myocardial infarction: Secondary | ICD-10-CM

## 2012-05-28 DIAGNOSIS — I10 Essential (primary) hypertension: Secondary | ICD-10-CM

## 2012-05-28 DIAGNOSIS — I2510 Atherosclerotic heart disease of native coronary artery without angina pectoris: Secondary | ICD-10-CM

## 2012-05-28 DIAGNOSIS — E7800 Pure hypercholesterolemia, unspecified: Secondary | ICD-10-CM

## 2012-05-28 DIAGNOSIS — Z8673 Personal history of transient ischemic attack (TIA), and cerebral infarction without residual deficits: Secondary | ICD-10-CM

## 2012-05-28 HISTORY — PX: LEFT HEART CATHETERIZATION WITH CORONARY ANGIOGRAM: SHX5451

## 2012-05-28 HISTORY — PX: PERCUTANEOUS CORONARY STENT INTERVENTION (PCI-S): SHX5485

## 2012-05-28 LAB — HEPARIN LEVEL (UNFRACTIONATED): Heparin Unfractionated: 0.58 IU/mL (ref 0.30–0.70)

## 2012-05-28 LAB — CBC
HCT: 37.3 % (ref 36.0–46.0)
Hemoglobin: 12.7 g/dL (ref 12.0–15.0)
MCHC: 34 g/dL (ref 30.0–36.0)
RDW: 13 % (ref 11.5–15.5)
WBC: 5 10*3/uL (ref 4.0–10.5)

## 2012-05-28 LAB — GLUCOSE, CAPILLARY
Glucose-Capillary: 205 mg/dL — ABNORMAL HIGH (ref 70–99)
Glucose-Capillary: 208 mg/dL — ABNORMAL HIGH (ref 70–99)

## 2012-05-28 LAB — BASIC METABOLIC PANEL
Chloride: 100 mEq/L (ref 96–112)
GFR calc Af Amer: >90 mL/min (ref >90)
GFR calc non Af Amer: 82 mL/min — ABNORMAL LOW (ref >90)
Potassium: 4 mEq/L (ref 3.5–5.1)
Sodium: 134 mEq/L — ABNORMAL LOW (ref 135–145)

## 2012-05-28 SURGERY — LEFT HEART CATHETERIZATION WITH CORONARY ANGIOGRAM
Anesthesia: LOCAL

## 2012-05-28 MED ORDER — RIVAROXABAN 20 MG PO TABS
20.0000 mg | ORAL_TABLET | Freq: Every day | ORAL | Status: DC
  Administered 2012-05-28: 20 mg via ORAL
  Filled 2012-05-28 (×2): qty 1

## 2012-05-28 MED ORDER — BIVALIRUDIN 250 MG IV SOLR
INTRAVENOUS | Status: AC
  Filled 2012-05-28: qty 250

## 2012-05-28 MED ORDER — NITROGLYCERIN 1 MG/10 ML FOR IR/CATH LAB
INTRA_ARTERIAL | Status: AC
  Filled 2012-05-28: qty 10

## 2012-05-28 MED ORDER — LIDOCAINE HCL (PF) 1 % IJ SOLN
INTRAMUSCULAR | Status: AC
  Filled 2012-05-28: qty 30

## 2012-05-28 MED ORDER — HEPARIN (PORCINE) IN NACL 2-0.9 UNIT/ML-% IJ SOLN
INTRAMUSCULAR | Status: AC
  Filled 2012-05-28: qty 1000

## 2012-05-28 MED ORDER — SODIUM CHLORIDE 0.9 % IV SOLN: 1.0000 mL/kg/h | INTRAVENOUS | Status: AC

## 2012-05-28 MED ORDER — SODIUM CHLORIDE 0.9 % IJ SOLN: 3.0000 mL | Freq: Two times a day (BID) | INTRAMUSCULAR | Status: DC

## 2012-05-28 MED ORDER — PANTOPRAZOLE SODIUM 40 MG PO TBEC: 40.0000 mg | DELAYED_RELEASE_TABLET | Freq: Every day | ORAL | Status: DC

## 2012-05-28 MED ORDER — ACETAMINOPHEN 325 MG PO TABS
650.0000 mg | ORAL_TABLET | ORAL | Status: DC | PRN
  Administered 2012-05-28 – 2012-05-29 (×2): 650 mg via ORAL
  Filled 2012-05-28 (×2): qty 2

## 2012-05-28 MED ORDER — CLOPIDOGREL BISULFATE 75 MG PO TABS: 75.0000 mg | ORAL_TABLET | Freq: Every day | ORAL | Status: DC

## 2012-05-28 MED ORDER — CLOPIDOGREL BISULFATE 300 MG PO TABS
ORAL_TABLET | ORAL | Status: AC
  Filled 2012-05-28: qty 1

## 2012-05-28 MED ORDER — FENTANYL CITRATE 0.05 MG/ML IJ SOLN
INTRAMUSCULAR | Status: AC
  Filled 2012-05-28: qty 2

## 2012-05-28 MED ORDER — ONDANSETRON HCL 4 MG/2ML IJ SOLN: 4.0000 mg | Freq: Four times a day (QID) | INTRAMUSCULAR | Status: DC | PRN

## 2012-05-28 MED ORDER — SODIUM CHLORIDE 0.9 % IJ SOLN: 3.0000 mL | INTRAMUSCULAR | Status: DC | PRN

## 2012-05-28 MED ORDER — SODIUM CHLORIDE 0.9 % IV SOLN: 250.0000 mL | INTRAVENOUS | Status: DC | PRN

## 2012-05-28 MED ORDER — MIDAZOLAM HCL 2 MG/2ML IJ SOLN
INTRAMUSCULAR | Status: AC
  Filled 2012-05-28: qty 2

## 2012-05-28 NOTE — Interval H&P Note (Signed)
History and Physical Interval Note:  05/28/2012 9:47 AM  Michelle Herrera  has presented today for surgery, with the diagnosis of sob  The various methods of treatment have been discussed with the patient and family. After consideration of risks, benefits and other options for treatment, the patient has consented to  Procedure(s): LEFT HEART CATHETERIZATION WITH CORONARY ANGIOGRAM (N/A) as a surgical intervention .  The patient's history has been reviewed, patient examined, no change in status, stable for surgery.  I have reviewed the patient's chart and labs.  Questions were answered to the patient's satisfaction.     Tonny Bollman

## 2012-05-28 NOTE — Progress Notes (Signed)
Patient reviewed pre-transfer to [facility] for Cardiac Cath. Doing well. Multiple somatic c/o including chronic bilat shoulder pain,  R Knee pain as well as cataract surgery.  Denies CP/SOB/N/V or dark stool when asked Anxious to go home.  Hungry. BP 139/77  Pulse 71  Temp(Src) 97.9 F (36.6 C) (Oral)  Resp 20  Ht 5\' 5"  (1.651 m)  Wt 56.7 kg (125 lb)  BMI 20.8 kg/m2  SpO2 99% Alert pleasant oriented.  EOMI cta b s1 s2 no m/r/g-Telel Rate controlled Afib abd soft, NT, ND  Await Cath data and decide on dispo-either home if returns to [facility] or other procedures needing to be done at [facility] Please inform me if patient stays at [facility] can potenitally follow there in consult if cardiology sees a need.  Pleas Koch, MD [facility] [phone]

## 2012-05-28 NOTE — Progress Notes (Signed)
ANTICOAGULATION CONSULT NOTE - Follow Up Consult  Pharmacy Consult for Heparin Indication: chest pain/ACS  Allergies  Allergen Reactions  . Peanut-Containing Drug Products Anaphylaxis and Swelling  . Azithromycin     REACTION: pt states INTOL to ZPak  . Flomax (Tamsulosin Hcl) Swelling    swelling  . Pioglitazone     edema  . Pregabalin     REACTION: pt states INTOL to Lyrica  . Sulfonamide Derivatives Swelling    REACTION: SWELLING    Patient Measurements: Height: 5\' 5"  (165.1 cm) Weight: 125 lb (56.7 kg) IBW/kg (Calculated) : 57 Heparin Dosing Weight:   Vital Signs: Temp: 97.9 F (36.6 C) (05/19 0657) Temp src: Oral (05/19 0657) BP: 139/77 mmHg (05/19 0657) Pulse Rate: 71 (05/19 0657)  Labs:  Recent Labs  05/26/12 0512 05/26/12 1001 05/26/12 1015 05/26/12 1616  05/26/12 2320 05/27/12 0517 05/27/12 1150 05/27/12 1953 05/28/12 0415  HGB 13.1  --   --   --   --   --  13.1  --   --  12.7  HCT 39.2  --   --   --   --   --  38.0  --   --  37.3  PLT 191  --   --   --   --   --  186  --   --  172  APTT  --  33  --   --   --   --  47*  --   --   --   LABPROT  --  20.0*  --   --   --   --   --   --   --   --   INR  --  1.77*  --   --   --   --   --   --   --   --   HEPARINUNFRC  --   --   --   --   < > >2.00*  --  0.73* 0.64 0.58  CREATININE 0.70  --   --   --   --   --  0.72  --   --  0.66  TROPONINI  --   --  0.38* 0.36*  --  <0.30  --   --   --   --   < > = values in this interval not displayed.  Estimated Creatinine Clearance: 51 ml/min (by C-G formula based on Cr of 0.66).   Medications:  Infusions:  . sodium chloride 1 mL/kg/hr (05/28/12 0346)  . heparin 500 Units/hr (05/27/12 0315)    Assessment:  Heparin level at goal  No reported bleeding  Cath planned for this AM  Goal of Therapy:  Heparin level 0.3-0.7 units/ml Monitor platelets by anticoagulation protocol: Yes   Plan:  Continue current rate of 500 units/hr with cath planned today at  [facility], PharmD, BCPS Pager [phone] 05/28/2012 8:25 AM

## 2012-05-28 NOTE — CV Procedure (Signed)
Cardiac Catheterization Procedure Note  Name: Michelle Herrera MRN: [id number] DOB: 03-16-1930  Procedure: Left Heart Cath, Selective Coronary Angiography, LV angiography,  PTCA/Stent of right coronary artery  Indication: Non-ST elevation infarction. This is a 79 year old woman with history of permanent atrial fibrillation and stroke. She's been maintained on chronic anticoagulation. She developed severe substernal chest pain and ruled in for non-ST elevation MI. She was referred for diagnostic catheterization. She's been treated with Xarelto long-term, but was started on IV heparin in anticipation of cardiac cath.   Diagnostic Procedure Details: The right groin was prepped, draped, and anesthetized with 1% lidocaine. Using the modified Seldinger technique, a 5 French sheath was introduced into the right femoral artery. Standard Judkins catheters were used for selective coronary angiography and left ventriculography. Catheter exchanges were performed over a wire.  The diagnostic procedure was well-tolerated without immediate complications.  PROCEDURAL FINDINGS Hemodynamics: AO 118/51 with a mean of 78 LV 122/5  Coronary angiography: Coronary dominance: right  Left mainstem: Widely patent without obstructive disease, mild calcification.  Left anterior descending (LAD): The LAD is mildly calcified. There is minor irregularity noted. The vessel is widely patent throughout its course. There is no obstructive disease noted. The vessel reaches the left ventricular apex. There is a small first diagonal branch without significant stenosis.  Left circumflex (LCx): The ramus intermedius has 40-50% ostial stenosis. The vessel is patent. It is of medium caliber. The AV groove circumflex has diffuse irregularity without significant stenosis. There is a single obtuse marginal branch without stenosis.  Right coronary artery (RCA): This is a large, dominant vessel. There is a discrete 95% stenosis in the  mid vessel. Otherwise the vessel is patent throughout its course without significant disease. The PDA and PLA branches are patent.  Left ventriculography: Left ventricular systolic function is grossly normal. The estimated left ventricular ejection fraction is 55%. There is mitral regurgitation visualized throughout systole and diastole. I suspect this is related to catheter position.  PCI Procedure Note:  Following the diagnostic procedure, the decision was made to proceed with PCI. Weight-based bivalirudin was given for anticoagulation. The patient had been given 75 mg of Plavix. She was given an additional 300 mg on the table. Once a therapeutic ACT was achieved, a 5 [place] JR 4 guide catheter was inserted.  A pro-water coronary guidewire was used to cross the lesion.  The lesion was predilated with a 2.5 x 12 mm balloon.  The lesion was then stented with a 4.0 x 12 mm MultiLink vision bare-metal stent.  The stent was postdilated with a 4.5 mm noncompliant balloon to 14 atmospheres.  Following PCI, there was 0% residual stenosis and TIMI-3 flow. Final angiography confirmed an excellent result. Femoral hemostasis was achieved with a Perclose device.  The patient tolerated the PCI procedure well. There were no immediate procedural complications.  The patient was transferred to the post catheterization recovery area for further monitoring.  PCI Data: Vessel - right coronary artery/Segment - mid Percent Stenosis (pre)  95 TIMI-flow 3 Stent 4.0 x 12 mm bare-metal Percent Stenosis (post) 0 TIMI-flow (post) 3  Final Conclusions:   1. Minimal nonobstructive disease involving the left main, LAD, and left circumflex 2. Severe focal stenosis of the mid right coronary artery, treated successfully with a bare-metal stent platform 3. Grossly normal left ventricular systolic function 4. At least moderate mitral regurgitation, suspect some degree of catheter induced MR.  Recommendations: Would treat with  Plavix for a total of 30 days. Continue Xarelto  for long-term anticoagulation (will resume today). No ASA because of excessive bleeding risk. Check echo to evaluate for mitral regurgitation.  Tonny Bollman 05/28/2012, 10:53 AM

## 2012-05-28 NOTE — H&P (View-Only) (Signed)
 TELEMETRY: Reviewed telemetry pt in atrial fibrillation with controlled response 65-70: Filed Vitals:   05/27/12 1724 05/27/12 2241 05/28/12 0657 05/28/12 0705  BP: 134/60 131/59 139/77   Pulse: 69 68 71   Temp:  98.2 F (36.8 C) 97.9 F (36.6 C)   TempSrc:  Oral Oral   Resp:  20 20   Height:      Weight:    125 lb (56.7 kg)  SpO2:  98% 99%     Intake/Output Summary (Last 24 hours) at 05/28/12 0730 Last data filed at 05/28/12 0702  Gross per 24 hour  Intake 814.81 ml  Output    650 ml  Net 164.81 ml    SUBJECTIVE Denies any chest pain or SOB. Feels well.  LABS: Basic Metabolic Panel:  Recent Labs  05/25/12 1045  05/27/12 0517 05/28/12 0415  NA  --   < > 134* 134*  K  --   < > 3.4* 4.0  CL  --   < > 97 100  CO2  --   < > 29 26  GLUCOSE  --   < > 233* 227*  BUN  --   < > 16 14  CREATININE  --   < > 0.72 0.66  CALCIUM  --   < > 9.3 9.1  MG 1.6  --   --   --   PHOS 3.2  --   --   --   < > = values in this interval not displayed. Liver Function Tests:  Recent Labs  05/27/12 0517  AST 14  ALT 8  ALKPHOS 74  BILITOT 1.0  PROT 5.9*  ALBUMIN 3.3*   No results found for this basename: LIPASE, AMYLASE,  in the last 72 hours CBC:  Recent Labs  05/27/12 0517 05/28/12 0415  WBC 4.9 5.0  HGB 13.1 12.7  HCT 38.0 37.3  MCV 90.7 91.4  PLT 186 172   Cardiac Enzymes:  Recent Labs  05/26/12 1015 05/26/12 1616 05/26/12 2320  TROPONINI 0.38* 0.36* <0.30   Thyroid Function Tests:  Recent Labs  05/25/12 1045  TSH 2.820    Radiology/Studies:  Dg Chest Port 1 View  05/25/2012   *RADIOLOGY REPORT*  Clinical Data: Chest pain  PORTABLE CHEST - 1 VIEW  Comparison: 04/16/2012  Findings: Cardiomegaly without CHF or pneumonia.  Stable background COPD/emphysema with apical scarring.  Lungs remain hyperinflated. No focal airspace process, edema, or effusion.  Degenerative changes of the spine.  IMPRESSION: Cardiomegaly without CHF or pneumonia  Stable  COPD/emphysema with apical scarring   Original Report Authenticated By: M. Shick, M.D.   Ecg: Atrial fibrillation with ST-T wave abnormality c/w lateral ischemia.  PHYSICAL EXAM General: Well developed, well nourished, in no acute distress. Head: Normal. Missing dentition. Neck: Negative for carotid bruits. JVD not elevated. Lungs: Clear bilaterally to auscultation without wheezes, rales, or rhonchi. Breathing is unlabored. Heart: IRRR S1 S2 without murmurs, rubs, or gallops.  Abdomen: Soft, non-tender, non-distended with normoactive bowel sounds. No hepatomegaly. Msk:  Strength and tone appears normal for age. Extremities: No clubbing, cyanosis or edema.  Distal pedal pulses are 2+ and equal bilaterally. Neuro: Alert and oriented X 3. Moves all extremities spontaneously.   ASSESSMENT AND PLAN: 1. NSTEMI. Lateral ST-T wave changes. Started on ASA and Plavix. Xarelto on hold for cath today. Continue beta blocker. On IV heparin.  2. Chronic atrial fibrillation. Rate well controlled. Will need long term anticoagulation with history of embolic CVA and renal   infarct.  3. DM on SSI. Metformin on hold for cath. 4. HTN controlled.  5. Hyperlipidemia. On statin.  Active Problems:   HYPERTENSION   CHF   Atrial fibrillation   Vitamin B 12 deficiency   Type II or unspecified type diabetes mellitus with neurological manifestations, uncontrolled(250.62)   Chest pain   Acute myocardial infarction, subendocardial infarction, initial episode of care    Signed, Peter Jordan MD,FACC 05/28/2012 7:36 AM    

## 2012-05-28 NOTE — Progress Notes (Signed)
TELEMETRY: Reviewed telemetry pt in atrial fibrillation with controlled response 65-70: Filed Vitals:   05/27/12 1724 05/27/12 2241 05/28/12 0657 05/28/12 0705  BP: 134/60 131/59 139/77   Pulse: 69 68 71   Temp:  98.2 F (36.8 C) 97.9 F (36.6 C)   TempSrc:  Oral Oral   Resp:  20 20   Height:      Weight:    125 lb (56.7 kg)  SpO2:  98% 99%     Intake/Output Summary (Last 24 hours) at 05/28/12 0730 Last data filed at 05/28/12 0702  Gross per 24 hour  Intake 814.81 ml  Output    650 ml  Net 164.81 ml    SUBJECTIVE Denies any chest pain or SOB. Feels well.  LABS: Basic Metabolic Panel:  Recent Labs  16/10/96 1045  05/27/12 0517 05/28/12 0415  NA  --   < > 134* 134*  K  --   < > 3.4* 4.0  CL  --   < > 97 100  CO2  --   < > 29 26  GLUCOSE  --   < > 233* 227*  BUN  --   < > 16 14  CREATININE  --   < > 0.72 0.66  CALCIUM  --   < > 9.3 9.1  MG 1.6  --   --   --   PHOS 3.2  --   --   --   < > = values in this interval not displayed. Liver Function Tests:  Recent Labs  05/27/12 0517  AST 14  ALT 8  ALKPHOS 74  BILITOT 1.0  PROT 5.9*  ALBUMIN 3.3*   No results found for this basename: LIPASE, AMYLASE,  in the last 72 hours CBC:  Recent Labs  05/27/12 0517 05/28/12 0415  WBC 4.9 5.0  HGB 13.1 12.7  HCT 38.0 37.3  MCV 90.7 91.4  PLT 186 172   Cardiac Enzymes:  Recent Labs  05/26/12 1015 05/26/12 1616 05/26/12 2320  TROPONINI 0.38* 0.36* <0.30   Thyroid Function Tests:  Recent Labs  05/25/12 1045  TSH 2.820    Radiology/Studies:  Dg Chest Port 1 View  05/25/2012   *RADIOLOGY REPORT*  Clinical Data: Chest pain  PORTABLE CHEST - 1 VIEW  Comparison: 04/16/2012  Findings: Cardiomegaly without CHF or pneumonia.  Stable background COPD/emphysema with apical scarring.  Lungs remain hyperinflated. No focal airspace process, edema, or effusion.  Degenerative changes of the spine.  IMPRESSION: Cardiomegaly without CHF or pneumonia  Stable  COPD/emphysema with apical scarring   Original Report Authenticated By: Judie Petit. Shick, M.D.   Ecg: Atrial fibrillation with ST-T wave abnormality c/w lateral ischemia.  PHYSICAL EXAM General: Well developed, well nourished, in no acute distress. Head: Normal. Missing dentition. Neck: Negative for carotid bruits. JVD not elevated. Lungs: Clear bilaterally to auscultation without wheezes, rales, or rhonchi. Breathing is unlabored. Heart: IRRR S1 S2 without murmurs, rubs, or gallops.  Abdomen: Soft, non-tender, non-distended with normoactive bowel sounds. No hepatomegaly. Msk:  Strength and tone appears normal for age. Extremities: No clubbing, cyanosis or edema.  Distal pedal pulses are 2+ and equal bilaterally. Neuro: Alert and oriented X 3. Moves all extremities spontaneously.   ASSESSMENT AND PLAN: 1. NSTEMI. Lateral ST-T wave changes. Started on ASA and Plavix. Xarelto on hold for cath today. Continue beta blocker. On IV heparin.  2. Chronic atrial fibrillation. Rate well controlled. Will need long term anticoagulation with history of embolic CVA and renal  infarct.  3. DM on SSI. Metformin on hold for cath. 4. HTN controlled.  5. Hyperlipidemia. On statin.  Active Problems:   HYPERTENSION   CHF   Atrial fibrillation   Vitamin B 12 deficiency   Type II or unspecified type diabetes mellitus with neurological manifestations, uncontrolled(250.62)   Chest pain   Acute myocardial infarction, subendocardial infarction, initial episode of care    Signed, Anup Brigham [place] MD,FACC 05/28/2012 7:36 AM

## 2012-05-29 ENCOUNTER — Encounter (HOSPITAL_COMMUNITY): Payer: Self-pay | Admitting: Nurse Practitioner

## 2012-05-29 ENCOUNTER — Other Ambulatory Visit: Payer: Self-pay | Admitting: Nurse Practitioner

## 2012-05-29 ENCOUNTER — Telehealth: Payer: Self-pay | Admitting: Physician Assistant

## 2012-05-29 DIAGNOSIS — I340 Nonrheumatic mitral (valve) insufficiency: Secondary | ICD-10-CM

## 2012-05-29 LAB — BASIC METABOLIC PANEL
BUN: 10 mg/dL (ref 6–23)
CO2: 27 mEq/L (ref 19–32)
Calcium: 9.6 mg/dL (ref 8.4–10.5)
Creatinine, Ser: 0.67 mg/dL (ref 0.50–1.10)

## 2012-05-29 LAB — CBC
HCT: 37.3 % (ref 36.0–46.0)
MCH: 31.5 pg (ref 26.0–34.0)
MCV: 90.3 fL (ref 78.0–100.0)
Platelets: 159 10*3/uL (ref 150–400)
RBC: 4.13 MIL/uL (ref 3.87–5.11)

## 2012-05-29 LAB — GLUCOSE, CAPILLARY: Glucose-Capillary: 125 mg/dL — ABNORMAL HIGH (ref 70–99)

## 2012-05-29 MED ORDER — METFORMIN HCL 500 MG PO TABS: 1000.0000 mg | ORAL_TABLET | Freq: Two times a day (BID) | ORAL | Status: DC

## 2012-05-29 MED ORDER — ATORVASTATIN CALCIUM 40 MG PO TABS: 40.0000 mg | ORAL_TABLET | Freq: Every day | ORAL | Status: DC

## 2012-05-29 MED ORDER — LOSARTAN POTASSIUM 25 MG PO TABS
25.0000 mg | ORAL_TABLET | Freq: Every day | ORAL | Status: DC
  Administered 2012-05-29: 25 mg via ORAL
  Filled 2012-05-29: qty 1

## 2012-05-29 MED ORDER — LOSARTAN POTASSIUM 25 MG PO TABS: 25.0000 mg | ORAL_TABLET | Freq: Every day | ORAL | Status: DC

## 2012-05-29 MED ORDER — CLOPIDOGREL BISULFATE 75 MG PO TABS: 75.0000 mg | ORAL_TABLET | Freq: Every day | ORAL | Status: DC

## 2012-05-29 MED FILL — Sodium Chloride IV Soln 0.9%: INTRAVENOUS | Qty: 50 | Status: AC

## 2012-05-29 NOTE — Progress Notes (Addendum)
    Subjective:  Feels good. No CP or dyspnea.  Objective:  Vital Signs in the last 24 hours: Temp:  [97.6 F (36.4 C)-97.9 F (36.6 C)] 97.6 F (36.4 C) (05/20 0346) Pulse Rate:  [38-86] 74 (05/20 0346) BP: (150-182)/(64-131) 166/85 mmHg (05/20 0346) SpO2:  [82 %-100 %] 100 % (05/20 0346) Weight:  [58.6 kg (129 lb 3 oz)] 58.6 kg (129 lb 3 oz) (05/20 0001)  Intake/Output from previous day: 05/19 0701 - 05/20 0700 In: 955.9 [P.O.:480; I.V.:475.9] Out: 500 [Urine:500]  Physical Exam: Pt is alert and oriented, pleasant elderly woman in NAD HEENT: normal Neck: JVP - normal Lungs: CTA bilaterally CV: RRR without murmur or gallop Abd: soft, NT, Positive BS, no hepatomegaly Ext: no C/C/E, distal pulses intact and equal, right groin clear Skin: warm/dry no rash   Lab Results:  Recent Labs  05/28/12 0415 05/29/12 0555  WBC 5.0 4.8  HGB 12.7 13.0  PLT 172 159    Recent Labs  05/28/12 0415 05/29/12 0555  NA 134* 140  K 4.0 4.2  CL 100 105  CO2 26 27  GLUCOSE 227* 196*  BUN 14 10  CREATININE 0.66 0.67    Recent Labs  05/26/12 1616 05/26/12 2320  TROPONINI 0.36* <0.30    Cardiac Studies: none  Tele: Sinus rhythm, personally reviewed  Assessment/Plan:  1. NSTEMI - single vessel CAD now s/p PCI of the right coronary artery. Bare metal stent chosen because of focal disease and need for chronic anticoagulation. Recommend plavix 75 mg daily x 30 days, Xarelto 20 mg indefinitely. Consider low-dose ASA when she is done with plavix since she presented with NSTEMI.  2. PAF - maintaining sinus. Continue same Rx.  3. Hyperlipidemia - considering her advanced age, would decrease lipitor to 40 mg daily.   4. HTN - add ARB because of NSTEMI/diabetes. Otherwise continue home meds.  Dispo: home today. Follow-up Dr Ladona Ridgel or PA 1-2 weeks. Check BMET at follow-up with addition of ARB.   Tonny Bollman, M.D. 05/29/2012, 7:34 AM

## 2012-05-29 NOTE — Discharge Summary (Signed)
Patient ID: Michelle Herrera,  MRN: [id number], DOB/AGE: 1930/12/01 79 y.o.  Admit date: 05/25/2012 Discharge date: 05/29/2012  Primary Care Provider: NADEL,SCOTT M Primary Cardiologist: G. Ladona Ridgel, MD  Discharge Diagnoses Principal Problem:   Acute myocardial infarction, subendocardial infarction, initial episode of care  **s/p Cath/PCI this admission with bare-metal stenting of the RCA.  Active Problems:   Atrial fibrillation on chronic Xarelto anticoagulation   History of stroke and renal infarct   Type II or unspecified type diabetes mellitus with neurological manifestations, uncontrolled(250.62)   HYPERCHOLESTEROLEMIA   HYPERTENSION   Vitamin B 12 deficiency   Mitral regurgitation  **felt to be at least moderate by left ventriculography this admission.   Chronic combined systolic and diastolic CHF   Allergies Allergies  Allergen Reactions  . Peanut-Containing Drug Products Anaphylaxis and Swelling  . Azithromycin     REACTION: pt states INTOL to ZPak  . Flomax (Tamsulosin Hcl) Swelling    swelling  . Pioglitazone     edema  . Pregabalin     REACTION: pt states INTOL to Lyrica  . Sulfonamide Derivatives Swelling    REACTION: SWELLING    Procedures  Cardiac Catheterization and Percutaneous Coronary Intervention 5.19.2014  PROCEDURAL FINDINGS Hemodynamics: AO 118/51 with a mean of 78 LV 122/5  Coronary angiography: Coronary dominance: right  Left mainstem: Widely patent without obstructive disease, mild calcification. Left anterior descending (LAD): The LAD is mildly calcified. There is minor irregularity noted. The vessel is widely patent throughout its course. There is no obstructive disease noted. The vessel reaches the left ventricular apex. There is a small first diagonal branch without significant stenosis. Left circumflex (LCx): The ramus intermedius has 40-50% ostial stenosis. The vessel is patent. It is of medium caliber. The AV groove circumflex has  diffuse irregularity without significant stenosis. There is a single obtuse marginal branch without stenosis. Right coronary artery (RCA): This is a large, dominant vessel. There is a discrete 95% stenosis in the mid vessel. Otherwise the vessel is patent throughout its course without significant disease. The PDA and PLA branches are patent.   **The RCA was successfully stented using a 4.0 x 12 mm Vision bare-metal stent**  Left ventriculography: Left ventricular systolic function is grossly normal. The estimated left ventricular ejection fraction is 55%. There is mitral regurgitation visualized throughout systole and diastole. I suspect this is related to catheter position. _____________  History of Present Illness  79 year old female with a history of chronic rate controlled atrial fibrillation on chronic anticoagulation with Xarelto, previously complicated by stroke and renal infarct, as well as a history of HTN, HL, an ddiabetes.  She was in her usual state of health until 3 AM on 05/25/2012, when she awoke suddenly with epigastric pain and pressure associated with nausea, dyspnea, and diaphoresis.  Discomfort resolved within 30 mins.  She presented to the [facility] ED where ECG showed non-specific ST changes.  Initial troponin was normal and patient was admitted by internal medicine for further evaluation.  Hospital Course  Following admission, patient ruled in for NSTEMI, eventually peaking her troponin at 0.65.  She was seen by cardiology and it was recommended that she undergo cardiac catheterization.  In preparation for this, her Xarelto was placed on hold and she was bridged with heparin therapy over the weekend.  She had no further chest pain and underwent diagnostic catheterization on 5/19, revealing a 95% stenosis in the mid RCA.  This was felt to be the culprit vessel and was successfully  stented using a 4.0 x 12 mm Vision bare-metal stent.  Left ventriculography showed normal LV  function with at least moderate MR.  Follow-up echo was recommended and has been arranged for in the outpatient setting.  Post-PCI, Ms. Kunzman has had no further chest discomfort and has been ambulating without difficulty.  Xarelto has been resumed and she will be discharged home today in good condition.  She will continue on Plavix 75mg  daily x 30 days in addition to Xarelto.  She will not be receiving ASA while on both Plavix and Xarelto, however, we will consider using ASA 81mg  daily after she completes 30 days of Plavix.  She has been hypertensive and we have initiated ARB therapy at discharge.  She will have a BMET evaluated @ office follow-up.  Discharge Vitals Blood pressure 170/58, pulse 82, temperature 97.7 F (36.5 C), temperature source Oral, resp. rate 20, height 5\' 5"  (1.651 m), weight 129 lb 3 oz (58.6 kg), SpO2 100.00%.  Filed Weights   05/27/12 0424 05/28/12 0705 05/29/12 0001  Weight: 123 lb 7.3 oz (56 kg) 125 lb (56.7 kg) 129 lb 3 oz (58.6 kg)   Labs  CBC  Recent Labs  05/28/12 0415 05/29/12 0555  WBC 5.0 4.8  HGB 12.7 13.0  HCT 37.3 37.3  MCV 91.4 90.3  PLT 172 159   Basic Metabolic Panel  Recent Labs  05/28/12 0415 05/29/12 0555  NA 134* 140  K 4.0 4.2  CL 100 105  CO2 26 27  GLUCOSE 227* 196*  BUN 14 10  CREATININE 0.66 0.67  CALCIUM 9.1 9.6   Liver Function Tests  Recent Labs  05/27/12 0517  AST 14  ALT 8  ALKPHOS 74  BILITOT 1.0  PROT 5.9*  ALBUMIN 3.3*   Cardiac Enzymes  Recent Labs  05/26/12 1015 05/26/12 1616 05/26/12 2320  TROPONINI 0.38* 0.36* <0.30   Thyroid Function Tests Lab Results  Component Value Date   TSH 2.820 05/25/2012    Disposition  Pt is being discharged home today in good condition.  Follow-up Plans & Appointments      Follow-up Information   Follow up with Tereso Newcomer, PA-C On 06/15/2012. (8:50 AM)    Contact information:   [address] [place] [address] [phone]         Follow up with [facility] On 06/15/2012. (10:30 AM - for Echocardiogram)    Contact information:   [address] [place] [address] [phone]      Follow up with Michele Mcalpine, MD On 10/18/2012. (9:30 AM)    Contact information:   [address] [place] [address] [phone]     Discharge Medications    Medication List    TAKE these medications       atenolol 50 MG tablet  Commonly known as:  TENORMIN  Take 50 mg by mouth 2 (two) times daily.     atorvastatin 40 MG tablet  Commonly known as:  LIPITOR  Take 1 tablet (40 mg total) by mouth daily at 6 PM.     clopidogrel 75 MG tablet  Commonly known as:  PLAVIX  Take 1 tablet (75 mg total) by mouth daily with breakfast.     gabapentin 100 MG capsule  Commonly known as:  NEURONTIN  Take 100 mg by mouth 3 (three) times daily.     glipiZIDE 10 MG tablet  Commonly known as:  GLUCOTROL  Take 10 mg by mouth  2 (two) times daily before a meal.     losartan 25 MG tablet  Commonly known as:  COZAAR  Take 1 tablet (25 mg total) by mouth daily.     metFORMIN 500 MG tablet  Commonly known as:  GLUCOPHAGE  Take 2 tablets (1,000 mg total) by mouth 2 (two) times daily with a meal.     Rivaroxaban 20 MG Tabs  Commonly known as:  XARELTO  Take 20 mg by mouth every evening.     vitamin B-12 1000 MCG tablet  Commonly known as:  CYANOCOBALAMIN  Take 1,000 mcg by mouth every morning.      Outstanding Labs/Studies  F/u bmet @ f/u appt.  Echo scheduled for 6/6 to re-eval mitral regurgitation.  Duration of Discharge Encounter   Greater than 30 minutes including physician time.  Signed, Nicolasa Ducking NP 05/29/2012, 9:01 AM

## 2012-05-29 NOTE — Progress Notes (Signed)
CARDIAC REHAB PHASE I   PRE:  Rate/Rhythm: 86 Afib  BP:  Supine:   Sitting: 170/58  Standing:    SaO2:   MODE:  Ambulation: 450 ft   POST:  Rate/Rhythm: 101 Afib  BP:  Supine:   Sitting: 168/88  Standing:   SaO2:  [id number] Assisted X 1 to ambulate. Pt limps on right knee due to injury, she needs knee surgery. She was able to walk 450 feet without c/o of cp or SOB. Pt back to recliner after walk with call light in reach. Completed MI education with pt. I had a difficult time keeping pt focused on education. Pt was able to return information with teach back.She declines Outpt. CRP, not interested.  Melina Copa RN 05/29/2012 9:25 AM

## 2012-05-29 NOTE — Telephone Encounter (Signed)
New problem    Pt has 14 day TCM w/ Scott on 06/15/12.

## 2012-05-30 NOTE — Telephone Encounter (Signed)
No answer or voice mail

## 2012-05-30 NOTE — Telephone Encounter (Signed)
Attempted to call pt.  No answer or VM.

## 2012-05-31 NOTE — Telephone Encounter (Signed)
Patient contacted regarding discharge from hospital  on 05/29/12.  Patient understands to follow up with provider Tereso Newcomer, PA-C on 6/6 at 8:50 at [facility]. Patient understands discharge instructions? Yes Patient understands medications and regiment? Yes Patient understands to bring all medications to this visit? Yes  Patient states she is feeling well and denies chest pain and SOB

## 2012-06-15 ENCOUNTER — Encounter: Payer: Self-pay | Admitting: Physician Assistant

## 2012-06-15 ENCOUNTER — Ambulatory Visit (INDEPENDENT_AMBULATORY_CARE_PROVIDER_SITE_OTHER): Payer: Medicare Other | Admitting: Physician Assistant

## 2012-06-15 ENCOUNTER — Telehealth: Payer: Self-pay | Admitting: *Deleted

## 2012-06-15 ENCOUNTER — Ambulatory Visit (HOSPITAL_COMMUNITY): Payer: Medicare Other | Attending: Pulmonary Disease | Admitting: Radiology

## 2012-06-15 VITALS — BP 162/100 | HR 72 | Ht 65.0 in | Wt 129.8 lb

## 2012-06-15 DIAGNOSIS — I252 Old myocardial infarction: Secondary | ICD-10-CM | POA: Insufficient documentation

## 2012-06-15 DIAGNOSIS — I10 Essential (primary) hypertension: Secondary | ICD-10-CM | POA: Insufficient documentation

## 2012-06-15 DIAGNOSIS — E785 Hyperlipidemia, unspecified: Secondary | ICD-10-CM | POA: Insufficient documentation

## 2012-06-15 DIAGNOSIS — I4891 Unspecified atrial fibrillation: Secondary | ICD-10-CM

## 2012-06-15 DIAGNOSIS — I059 Rheumatic mitral valve disease, unspecified: Secondary | ICD-10-CM | POA: Insufficient documentation

## 2012-06-15 DIAGNOSIS — I2510 Atherosclerotic heart disease of native coronary artery without angina pectoris: Secondary | ICD-10-CM

## 2012-06-15 DIAGNOSIS — I340 Nonrheumatic mitral (valve) insufficiency: Secondary | ICD-10-CM

## 2012-06-15 DIAGNOSIS — E119 Type 2 diabetes mellitus without complications: Secondary | ICD-10-CM | POA: Insufficient documentation

## 2012-06-15 DIAGNOSIS — I5042 Chronic combined systolic (congestive) and diastolic (congestive) heart failure: Secondary | ICD-10-CM

## 2012-06-15 LAB — BASIC METABOLIC PANEL
CO2: 26 mEq/L (ref 19–32)
Calcium: 9.7 mg/dL (ref 8.4–10.5)
Glucose, Bld: 201 mg/dL — ABNORMAL HIGH (ref 70–99)
Sodium: 140 mEq/L (ref 135–145)

## 2012-06-15 MED ORDER — FUROSEMIDE 20 MG PO TABS: 20.0000 mg | ORAL_TABLET | Freq: Every day | ORAL | Status: DC

## 2012-06-15 NOTE — Progress Notes (Signed)
[address] [place], [place]  [address] Phone: [phone] Fax:  [phone]  Date:  06/15/2012   ID:  Michelle Herrera, DOB 08-08-1930, MRN [id number]  PCP:  Michele Mcalpine, MD  Cardiologist:  Dr. Dietrich Pates    Electrophysiologist:  Dr. Lewayne Bunting    History of Present Illness: Michelle Herrera is a 79 y.o. female who returns for f/u after a recent admission to the hospital for a NSTEMI.  She has a hx of permanent AFib, combined systolic and diastolic CHF, DM2, HTN, HL. She has failed Tikosyn and amiodarone therapy. Myoview in 2006 was negative for ischemia, EF 54%. Echocardiogram 01/2011: Septal hypokinesis, mild LVH, EF 40-45%, diast dysfunction, mild-moderate MR, moderate-severe LAE, moderate-severe RVE, moderate-severe decreased RVSF., moderate-severe LAE, moderate TR, PASP 45.  She was admitted 01/2011 with acute renal infarct likely secondary to microemboli. Renal ultrasound 02/01/11: Small amount of perinephric fluid on the right, no hydronephrosis, no focal mass identified, lack of focal mass lesions consistent with CT findings of focal perfusion abnormalities, consistent with pyelonephritis versus infarct.    She was admitted 5/16-5/20 after presenting with epigastric pain associated with nausea, dyspnea and diaphoresis. Cardiac markers were abnormal ruling her in for a non-STEMI. Xarelto was placed on hold. LHC 05/28/12: Ostial ramus intermedius 40-50%, mid RCA 95%, EF 55%. PCI: Vision BMS to the mid RCA. There was moderate MR on left ventriculogram. Outpatient echocardiogram was recommended. Patient was maintained on Xarelto and Plavix. Final recommendation at discharge was to continue Plavix for 30 days. She would likely remain on low-dose aspirin in addition to Xarelto thereafter. Losartan was added to her medical regimen.  Since d/c, she is doing well.  Notes LE edema.  She does have dyspnea with more extreme activities.  No chest pain, orthopnea, PND, syncope.      Labs (5/14):  K 4.2, Cr 0.67, ALT 8, Hgb 13, TSH 2.820  Wt Readings from Last 3 Encounters:  06/15/12 129 lb 12.8 oz (58.877 kg)  05/29/12 129 lb 3 oz (58.6 kg)  05/29/12 129 lb 3 oz (58.6 kg)     Past Medical History  Diagnosis Date  . HTN (hypertension)   . Chronic combined systolic and diastolic CHF (congestive heart failure)     a. 01/2011 Echo: EF 40-45%, diff HK, diast dysfxn, mild to mod MR, mod to sev dil LA/RV/RA, mod to sev reduced RV fxn, mod TR, PASP .  [name] Pure hypercholesterolemia   . Type II or unspecified type diabetes mellitus without mention of complication, not stated as uncontrolled   . Anxiety state, unspecified   . Diaphragmatic hernia without mention of obstruction or gangrene   . Benign neoplasm of colon   . Mixed incontinence urge and stress (female)(female)   . Osteoarthrosis, unspecified whether generalized or localized, unspecified site   . Lumbago   . Osteoporosis, unspecified   . Memory loss   . Renal infarction     01/2011 in setting of low INR  . History of stroke   . AF (atrial fibrillation)     a. Chronic Xarelto  . CAD (coronary artery disease)     a. 05/2012 NSTEMI/Cath/PCI: LM nl, LAD min irregs, LCX min irregs, RI 40-50 ost, OM1 nl, RCA dom, 33m (4.0x12 Vision BMS), PD/PL nl, EF 55%, at least mod MR.  . Mitral regurgitation     a. mild to mod by echo 01/2011;  b. felt to be at least mod by LV gram 05/2012.  Current Outpatient Prescriptions  Medication Sig Dispense Refill  . atenolol (TENORMIN) 50 MG tablet Take 50 mg by mouth 2 (two) times daily.      [name] atorvastatin (LIPITOR) 40 MG tablet Take 1 tablet (40 mg total) by mouth daily at 6 PM.  30 tablet  6  . clopidogrel (PLAVIX) 75 MG tablet Take 1 tablet (75 mg total) by mouth daily with breakfast.  30 tablet  0  . gabapentin (NEURONTIN) 100 MG capsule Take 100 mg by mouth 3 (three) times daily.      [name] glipiZIDE (GLUCOTROL) 10 MG tablet Take 10 mg by mouth 2 (two) times daily before a  meal.      . losartan (COZAAR) 25 MG tablet Take 1 tablet (25 mg total) by mouth daily.  30 tablet  6  . metFORMIN (GLUCOPHAGE) 500 MG tablet Take 2 tablets (1,000 mg total) by mouth 2 (two) times daily with a meal.      . Rivaroxaban (XARELTO) 20 MG TABS Take 20 mg by mouth every evening.      . vitamin B-12 (CYANOCOBALAMIN) 1000 MCG tablet Take 1,000 mcg by mouth every morning.        No current facility-administered medications for this visit.    Allergies:    Allergies  Allergen Reactions  . Peanut-Containing Drug Products Anaphylaxis and Swelling  . Azithromycin     REACTION: pt states INTOL to ZPak  . Flomax (Tamsulosin Hcl) Swelling    swelling  . Pioglitazone     edema  . Pregabalin     REACTION: pt states INTOL to Lyrica  . Sulfonamide Derivatives Swelling    REACTION: SWELLING    Social History:  The patient  reports that she has never smoked. She has never used smokeless tobacco. She reports that she does not drink alcohol or use illicit drugs.   ROS:  Please see the history of present illness.      All other systems reviewed and negative.   PHYSICAL EXAM: VS:  BP 162/100  Pulse 72  Ht 5\' 5"  (1.651 m)  Wt 129 lb 12.8 oz (58.877 kg)  BMI 21.6 kg/m2 Well nourished, well developed, in no acute distress HEENT: normal Neck: no JVD Cardiac:  normal S1, S2; RRR; no murmur Lungs:  clear to auscultation bilaterally, no wheezing, rhonchi or rales Abd: soft, nontender, no hepatomegaly Ext: no edema; right groin without hematoma or bruit  Skin: warm and dry Neuro:  CNs 2-12 intact, no focal abnormalities noted  EKG:  AFib, HR 72, inf ST abnormality, no change from prior     ASSESSMENT AND PLAN:  1. CAD: Doing well. No angina. She had a bare-metal stent placed. She requires long-term Xarelto therapy. Unfortunately, she has been taking aspirin in addition to Plavix and Xarelto. I have asked her to hold her aspirin for now. She will complete 30 days of Plavix. She will  then resume aspirin 81 mg daily in addition to her Xarelto. She is not interested in cardiac rehabilitation. She will continue to walk on her own. Continue statin. 2. Hypertension: Uncontrolled. I will add low-dose Lasix as noted below. 3. Chronic Combined Systolic and Diastolic CHF: She has chronic LE edema. I will add low-dose Lasix 20 mg daily. Check a basic metabolic panel today and repeat in one week. 4. Hyperlipidemia: Continue statin. 5. Atrial Fibrillation: Rate controlled. Continue Xarelto. 6. Mitral Regurgitation: Repeat echo planned for today. 7. Disposition: Followup with me in 3-4 weeks.  Signed, Lorin Picket  Alben Spittle, PA-C  06/15/2012 9:06 AM

## 2012-06-15 NOTE — Telephone Encounter (Signed)
s/w pt's daughter-in-law, pt left today after her appt w/Scott W. but forgot to have her echo done at 10:30. I tried to catch the pt before she left but she was already gone. daughter-in-law said she will call the son's cell phone, tell them about echo

## 2012-06-15 NOTE — Patient Instructions (Addendum)
START LASIX 20 MG DAILY  LAB TODAY; BMET  REPEAT BMET IN 1 WEEK 06/22/12  HOLD ASPIRIN   TAKE PLAVIX FOR A TOTAL OF 30 DAYS ; ONCE DONE WITH THE PLAVIX YOU WILL RE-START THE ASPIRIN 81 MG DAILY  YOU HAVE BEEN GIVEN SAMPLES OF XARELTO  PLEASE FOLLOW UP WITH SCOTT 07/20/12 @ 12/10

## 2012-06-15 NOTE — Progress Notes (Signed)
Echocardiogram performed.  

## 2012-06-18 ENCOUNTER — Telehealth: Payer: Self-pay | Admitting: *Deleted

## 2012-06-18 NOTE — Telephone Encounter (Signed)
s/w family member who said Michelle Herrera was at work gets home after 5 pm. I explained I had test results in but no answer at pt's home #. I will call pt's back later and if still no answer then I will call her son after 5 pm today

## 2012-06-18 NOTE — Telephone Encounter (Signed)
Message copied by Tarri Fuller on Mon Jun 18, 2012 10:37 AM ------      Message from: [place], [place] T      Created: Sat Jun 16, 2012  4:02 PM       K+ and creatinine ok.      F/u with PCP for diabetes management (sugar 201)      Continue with current treatment plan.      Tereso Newcomer, PA-C        06/16/2012 4:01 PM ------

## 2012-06-18 NOTE — Telephone Encounter (Signed)
no answer, will try again later 

## 2012-06-18 NOTE — Telephone Encounter (Signed)
pt notified about her lab results and also reminded pt to finish a full 30 dys of plavix and when done then start asa. pt said ok. pt said cath site burn feeling/hurt for a little bit earlier but better now, advised to call office if happens again; pt verbalized understanding to all instructions today

## 2012-06-21 ENCOUNTER — Other Ambulatory Visit: Payer: Self-pay | Admitting: Pulmonary Disease

## 2012-07-18 ENCOUNTER — Telehealth: Payer: Self-pay | Admitting: Internal Medicine

## 2012-07-18 NOTE — Telephone Encounter (Signed)
New Problem:    Patient called in wanting to know if there were any samples of Rivaroxaban (XARELTO) 20 MG TABS available for her to have.  Please call back.

## 2012-07-18 NOTE — Telephone Encounter (Signed)
Pt. Needs samples of Xarelto 20 mg. A 20 sample pills placed at the front desk. Pt aware.

## 2012-07-20 ENCOUNTER — Ambulatory Visit (INDEPENDENT_AMBULATORY_CARE_PROVIDER_SITE_OTHER): Payer: Medicare Other | Admitting: Physician Assistant

## 2012-07-20 ENCOUNTER — Encounter: Payer: Self-pay | Admitting: Physician Assistant

## 2012-07-20 ENCOUNTER — Ambulatory Visit: Payer: Medicare Other | Admitting: Physician Assistant

## 2012-07-20 VITALS — BP 136/80 | HR 72 | Ht 65.0 in | Wt 126.0 lb

## 2012-07-20 DIAGNOSIS — I10 Essential (primary) hypertension: Secondary | ICD-10-CM

## 2012-07-20 DIAGNOSIS — I5042 Chronic combined systolic (congestive) and diastolic (congestive) heart failure: Secondary | ICD-10-CM

## 2012-07-20 DIAGNOSIS — I4891 Unspecified atrial fibrillation: Secondary | ICD-10-CM

## 2012-07-20 DIAGNOSIS — I2510 Atherosclerotic heart disease of native coronary artery without angina pectoris: Secondary | ICD-10-CM

## 2012-07-20 LAB — BASIC METABOLIC PANEL
BUN: 14 mg/dL (ref 6–23)
CO2: 29 mEq/L (ref 19–32)
Chloride: 104 mEq/L (ref 96–112)
Creatinine, Ser: 1 mg/dL (ref 0.4–1.2)

## 2012-07-20 NOTE — Patient Instructions (Signed)
Schedule follow up with Dr. Lewayne Bunting in 3 months.

## 2012-07-20 NOTE — Progress Notes (Signed)
[address] [place], [place]  [address] Phone: [phone] Fax:  [phone]  Date:  07/20/2012   ID:  Michelle SALMONS, DOB 1930-05-22, MRN [id number]  PCP:  Michele Mcalpine, MD  Cardiologist:  Dr. Lewayne Bunting    History of Present Illness: Michelle Herrera is a 79 y.o. female who returns for f/u.  She has a hx of permanent AFib, combined systolic and diastolic CHF, DM2, HTN, HL. She has failed Tikosyn and amiodarone therapy. Myoview in 2006 was negative for ischemia, EF 54%. Echocardiogram 01/2011: Septal hypokinesis, mild LVH, EF 40-45%, diast dysfunction, mild-moderate MR, moderate-severe LAE, moderate-severe RVE, moderate-severe decreased RVSF, moderate-severe LAE, moderate TR, PASP 45.  She was admitted 01/2011 with acute renal infarct likely secondary to microemboli. Renal ultrasound 02/01/11: Small amount of perinephric fluid on the right, no hydronephrosis, no focal mass identified, lack of focal mass lesions consistent with CT findings of focal perfusion abnormalities, consistent with pyelonephritis versus infarct.    Admitted 05/2011 for a non-STEMI.  LHC 05/28/12: oRI 40-50%, mid RCA 95%, EF 55%. PCI: Vision BMS to the mid RCA. There was moderate MR on left ventriculogram. Outpatient echocardiogram was recommended. Echo 06/15/12:  EF 55%, mild MR, severe BAE, mod TR, PASP 35.  She was to remain on Plavix and Xarelto for 30 days, then stay on ASA and Xarelto.  I saw her 06/15/12 in f/u.  Lasix was added due to volume overload and elevated BP.  Since last seen, she is doing well.  No CP.  No significant dyspnea.  No syncope.  No orthopnea, PND.  LE edema is improved.    Labs (5/14):  K 4.2, Cr 0.67, ALT 8, Hgb 13, TSH 2.820 Labs (6/14):  K 4.5, Cr 0.7  Wt Readings from Last 3 Encounters:  07/20/12 126 lb (57.153 kg)  06/15/12 129 lb 12.8 oz (58.877 kg)  05/29/12 129 lb 3 oz (58.6 kg)     Past Medical History  Diagnosis Date  . HTN (hypertension)   . Pure  hypercholesterolemia   . Type II or unspecified type diabetes mellitus without mention of complication, not stated as uncontrolled   . Anxiety state, unspecified   . Diaphragmatic hernia without mention of obstruction or gangrene   . Benign neoplasm of colon   . Mixed incontinence urge and stress (female)(female)   . Osteoarthrosis, unspecified whether generalized or localized, unspecified site   . Lumbago   . Osteoporosis, unspecified   . Memory loss   . Renal infarction     01/2011 in setting of low INR  . History of stroke   . AF (atrial fibrillation)     a. Chronic Xarelto  . CAD (coronary artery disease)     a. 05/2012 NSTEMI/Cath/PCI: LM nl, LAD min irregs, LCX min irregs, RI 40-50 ost, OM1 nl, RCA dom, 43m (4.0x12 Vision BMS), PD/PL nl, EF 55%, at least mod MR.  . Mitral regurgitation     a. mild to mod by echo 01/2011;  b. felt to be at least mod by LV gram 05/2012.  [name] Chronic combined systolic and diastolic CHF (congestive heart failure)     a. 01/2011 Echo: EF 40-45%, diff HK, diast dysfxn, mild to mod MR, mod to sev dil LA/RV/RA, mod to sev reduced RV fxn, mod TR, PASP .;  b.  Echo 06/15/12:  EF 55%, mild MR, severe BAE, mod TR, PASP 35    Current Outpatient Prescriptions  Medication Sig Dispense Refill  .  aspirin 81 MG tablet Take 81 mg by mouth daily.      [name] atenolol (TENORMIN) 50 MG tablet Take 50 mg by mouth 2 (two) times daily.      [name] atorvastatin (LIPITOR) 40 MG tablet Take 1 tablet (40 mg total) by mouth daily at 6 PM.  30 tablet  6  . furosemide (LASIX) 20 MG tablet Take 1 tablet (20 mg total) by mouth daily.  30 tablet  11  . gabapentin (NEURONTIN) 100 MG capsule Take 100 mg by mouth 3 (three) times daily.      [name] glipiZIDE (GLUCOTROL) 10 MG tablet Take 10 mg by mouth 2 (two) times daily before a meal.      . losartan (COZAAR) 25 MG tablet Take 1 tablet (25 mg total) by mouth daily.  30 tablet  6  . metFORMIN (GLUCOPHAGE) 500 MG tablet Take 2 tablets (1,000 mg total)  by mouth 2 (two) times daily with a meal.      . Rivaroxaban (XARELTO) 20 MG TABS Take 20 mg by mouth every evening.      . vitamin B-12 (CYANOCOBALAMIN) 1000 MCG tablet Take 1,000 mcg by mouth every morning.       . metFORMIN (GLUCOPHAGE) 500 MG tablet TAKE TWO TABLETS BY MOUTH TWICE DAILY WITH MEALS  120 tablet  6   No current facility-administered medications for this visit.    Allergies:    Allergies  Allergen Reactions  . Peanut-Containing Drug Products Anaphylaxis and Swelling  . Azithromycin     REACTION: pt states INTOL to ZPak  . Flomax (Tamsulosin Hcl) Swelling    swelling  . Pioglitazone     edema  . Pregabalin     REACTION: pt states INTOL to Lyrica  . Sulfonamide Derivatives Swelling    REACTION: SWELLING    Social History:  The patient  reports that she has never smoked. She has never used smokeless tobacco. She reports that she does not drink alcohol or use illicit drugs.   ROS:  Please see the history of present illness.      All other systems reviewed and negative.   PHYSICAL EXAM: VS:  BP 136/80  Pulse 72  Ht 5\' 5"  (1.651 m)  Wt 126 lb (57.153 kg)  BMI 20.97 kg/m2  SpO2 97% Well nourished, well developed, in no acute distress HEENT: normal Neck: no JVD Cardiac:  normal S1, S2; RRR; no murmur Lungs:  clear to auscultation bilaterally, no wheezing, rhonchi or rales Abd: soft, nontender, no hepatomegaly Ext: no edema  Skin: warm and dry Neuro:  CNs 2-12 intact, no focal abnormalities noted  ASSESSMENT AND PLAN:  1. CAD: Doing well. No angina. She had a bare-metal stent placed. She requires long-term Xarelto therapy. She is now on ASA 81 and Xarelto. Continue statin. 2. Hypertension: Controlled.  3. Chronic Combined Systolic and Diastolic CHF:  Volume improved. Check f/u BMET today.  4. Hyperlipidemia: Continue statin. 5. Atrial Fibrillation: Rate controlled. Continue Xarelto. 6. Mitral Regurgitation:  Mild by repeat echo.  Continue current  Rx. 7. Disposition: Followup with Dr. Lewayne Bunting in 3 mos.  Signed, Tereso Newcomer, PA-C  07/20/2012 1:48 PM

## 2012-07-24 ENCOUNTER — Telehealth: Payer: Self-pay | Admitting: Pulmonary Disease

## 2012-07-24 NOTE — Telephone Encounter (Signed)
I spoke with the pt and she is c/o having a rash in her groin area. Pt states it is red and has small red bumps as well. Pt states the rash does itch and if she scratches it will bleed. Pt states she feels this may be due to her issue with bladder leakage. She states she has issues with staying dry and will break out in a rash sometimes. Pt has some left over cipro from prev rx and wanted to know if she should take them. I advised that an abx may not be what she needs and to wait until we get SN recs. Please advise. Carron Curie, CMA Allergies  Allergen Reactions  . Peanut-Containing Drug Products Anaphylaxis and Swelling  . Azithromycin     REACTION: pt states INTOL to ZPak  . Flomax (Tamsulosin Hcl) Swelling    swelling  . Pioglitazone     edema  . Pregabalin     REACTION: pt states INTOL to Lyrica  . Sulfonamide Derivatives Swelling    REACTION: SWELLING

## 2012-07-24 NOTE — Telephone Encounter (Signed)
Per SN---  Ok to take the cipro that she has and we will refer her to Dermatology to check.   She will need to try and not scratch this area and try to keep the area clean and dry.  Order has been placed for the pt to see derm and pt is aware and she stated that there is nothing that the derm can do and she would rather not go to see them.  Order has been taken out per pts request.   Nothing further is needed.

## 2012-08-01 ENCOUNTER — Other Ambulatory Visit: Payer: Self-pay | Admitting: Pulmonary Disease

## 2012-08-01 MED ORDER — GABAPENTIN 100 MG PO CAPS: 100.0000 mg | ORAL_CAPSULE | Freq: Three times a day (TID) | ORAL | Status: DC

## 2012-08-01 NOTE — Telephone Encounter (Signed)
Received faxed refill request from [name]  Med last refilled 5.14.14 #90 w/ 3 refills Last ov 4.7.14 w/ SN  Rx sent pharmacy

## 2012-08-13 ENCOUNTER — Telehealth: Payer: Self-pay | Admitting: Internal Medicine

## 2012-08-13 NOTE — Telephone Encounter (Signed)
New problem   Pt wants samples of xarelto

## 2012-08-14 NOTE — Telephone Encounter (Signed)
Patient aware samples out front

## 2012-08-14 NOTE — Telephone Encounter (Signed)
Follow Up     Pt calling following up on XARELTO sample request.

## 2012-08-21 ENCOUNTER — Telehealth: Payer: Self-pay | Admitting: Pulmonary Disease

## 2012-08-21 DIAGNOSIS — R21 Rash and other nonspecific skin eruption: Secondary | ICD-10-CM

## 2012-08-21 NOTE — Telephone Encounter (Signed)
Per SN: she needs to see her urologists or gynecologists. She needs to have an evaluation to see what it is going on. No meds can be called in by SN to help this.  I called pt and she will see who she can get in with. Nothing further was needed

## 2012-08-21 NOTE — Telephone Encounter (Signed)
I spoke with pt. She stated she has "rash" on her private part. She stated her bladder leaks and can't keep herself dry. She stated when she wipes then these bumps will bust and will have blood on her pants/Pad. She stated this has been going on x several months. Pt called back in July and SN recommended a referral to a dermatologists but she refused. She stated what would they do for her. She stated if SN still feels like this is where she needs to go then she would but needs a referral. She stated last year when this happened he gave her PCN and it healed. Please advise SN thanks  Allergies  Allergen Reactions  . Peanut-Containing Drug Products Anaphylaxis and Swelling  . Azithromycin     REACTION: pt states INTOL to ZPak  . Flomax (Tamsulosin Hcl) Swelling    swelling  . Pioglitazone     edema  . Pregabalin     REACTION: pt states INTOL to Lyrica  . Sulfonamide Derivatives Swelling    REACTION: SWELLING

## 2012-08-21 NOTE — Telephone Encounter (Signed)
I have placed referral.  ATC PT NA line rang over 10 times and no VM. WCB

## 2012-08-22 ENCOUNTER — Telehealth: Payer: Self-pay | Admitting: Pulmonary Disease

## 2012-08-22 NOTE — Telephone Encounter (Signed)
Pt aware.

## 2012-08-22 NOTE — Telephone Encounter (Signed)
Per 8.12.14 referral note: Referral Notes     Note    [facility] obgyn asked for records to be sent on this pt and they will call and make the appt pt is aware Michelle Herrera   ATC pt, line rang >10 times with no answer. WCB.

## 2012-08-23 NOTE — Telephone Encounter (Signed)
Pt called back again- she has not heard fro anyone at [facility] / gyn. Pt says "she just wiped and is burning and itching", "very miserable- please help her" and call back asap. Hazel Sams

## 2012-08-23 NOTE — Telephone Encounter (Signed)
I called and spoke with pt. Advised her we have sent referral over to [facility]. She was giving the # to call to see how soon she can get in. Nothing further needed

## 2012-09-03 ENCOUNTER — Telehealth: Payer: Self-pay | Admitting: Internal Medicine

## 2012-09-03 NOTE — Telephone Encounter (Signed)
Samples outfront 

## 2012-09-03 NOTE — Telephone Encounter (Signed)
New Prob  Pt is wanting some samples of Xarelto. She said she has 4 left and will be going out of town soon.

## 2012-09-21 ENCOUNTER — Telehealth: Payer: Self-pay | Admitting: Internal Medicine

## 2012-09-21 NOTE — Telephone Encounter (Signed)
Need samples of Xarelto. °

## 2012-09-24 NOTE — Telephone Encounter (Signed)
Will place samples out front.  Husbands aware

## 2012-10-12 ENCOUNTER — Encounter: Payer: Self-pay | Admitting: *Deleted

## 2012-10-12 NOTE — Progress Notes (Signed)
Patient ID: Michelle Herrera, female   DOB: 1930/03/20, 79 y.o.   MRN: [id number] Patient walked into office requesting Xarelto samples. Samples given #30.

## 2012-10-18 ENCOUNTER — Encounter: Payer: Self-pay | Admitting: Pulmonary Disease

## 2012-10-18 ENCOUNTER — Other Ambulatory Visit (INDEPENDENT_AMBULATORY_CARE_PROVIDER_SITE_OTHER): Payer: Medicare Other

## 2012-10-18 ENCOUNTER — Ambulatory Visit (INDEPENDENT_AMBULATORY_CARE_PROVIDER_SITE_OTHER): Payer: Medicare Other | Admitting: Pulmonary Disease

## 2012-10-18 VITALS — BP 134/80 | HR 77 | Temp 98.7°F | Ht 65.0 in | Wt 130.0 lb

## 2012-10-18 DIAGNOSIS — E7800 Pure hypercholesterolemia, unspecified: Secondary | ICD-10-CM

## 2012-10-18 DIAGNOSIS — M545 Low back pain, unspecified: Secondary | ICD-10-CM

## 2012-10-18 DIAGNOSIS — I214 Non-ST elevation (NSTEMI) myocardial infarction: Secondary | ICD-10-CM

## 2012-10-18 DIAGNOSIS — I5042 Chronic combined systolic (congestive) and diastolic (congestive) heart failure: Secondary | ICD-10-CM

## 2012-10-18 DIAGNOSIS — F411 Generalized anxiety disorder: Secondary | ICD-10-CM

## 2012-10-18 DIAGNOSIS — Z7901 Long term (current) use of anticoagulants: Secondary | ICD-10-CM

## 2012-10-18 DIAGNOSIS — I4891 Unspecified atrial fibrillation: Secondary | ICD-10-CM

## 2012-10-18 DIAGNOSIS — I509 Heart failure, unspecified: Secondary | ICD-10-CM

## 2012-10-18 DIAGNOSIS — Z23 Encounter for immunization: Secondary | ICD-10-CM

## 2012-10-18 DIAGNOSIS — N3946 Mixed incontinence: Secondary | ICD-10-CM

## 2012-10-18 DIAGNOSIS — I10 Essential (primary) hypertension: Secondary | ICD-10-CM

## 2012-10-18 DIAGNOSIS — R269 Unspecified abnormalities of gait and mobility: Secondary | ICD-10-CM

## 2012-10-18 DIAGNOSIS — E1149 Type 2 diabetes mellitus with other diabetic neurological complication: Secondary | ICD-10-CM

## 2012-10-18 DIAGNOSIS — K449 Diaphragmatic hernia without obstruction or gangrene: Secondary | ICD-10-CM

## 2012-10-18 DIAGNOSIS — M1990 Unspecified osteoarthritis, unspecified site: Secondary | ICD-10-CM

## 2012-10-18 DIAGNOSIS — D126 Benign neoplasm of colon, unspecified: Secondary | ICD-10-CM

## 2012-10-18 DIAGNOSIS — M810 Age-related osteoporosis without current pathological fracture: Secondary | ICD-10-CM

## 2012-10-18 LAB — LIPID PANEL
Cholesterol: 138 mg/dL (ref 0–200)
LDL Cholesterol: 61 mg/dL (ref 0–99)
Total CHOL/HDL Ratio: 2

## 2012-10-18 LAB — BASIC METABOLIC PANEL
Chloride: 102 mEq/L (ref 96–112)
Potassium: 4.1 mEq/L (ref 3.5–5.1)
Sodium: 142 mEq/L (ref 135–145)

## 2012-10-18 LAB — HEMOGLOBIN A1C: Hgb A1c MFr Bld: 8.8 % — ABNORMAL HIGH (ref 4.6–6.5)

## 2012-10-18 NOTE — Patient Instructions (Signed)
Today we updated your med list in our EPIC system...    Continue your current medications the same...  Today we did your follow up FASTING blood work...    We will contact you w/ the results when available...   Call for any questions...  Let's plan a follow up visit in 6mo, sooner if needed for problems...   

## 2012-10-18 NOTE — Progress Notes (Signed)
Subjective:    Patient ID: Michelle Herrera, female    DOB: 01-06-1931, 79 y.o.   MRN: [id number]  HPI 79 y/o WF here for a follow up visit... she has mult medical problems including:    ~  April 19, 2011:  76mo ROV & post hosp visit> Adm 1/13 by [facility] w/ flank pain and found to have a renal infarct (& sub therapeutic INR); CT Abd revealed abn perfusion to the cortex of both kidneys raising the question of infarcts; UA was neg & cult- no growth; she was seen by VascSurg & no surg needed; her pain was relieved after one shot of MS; she has followed up w/ Urology DrEskridge- hx incontinence & small capacity bladder, notes reviewed, tried Myrbetriq w/o much benenfit she says...    Sugars were not well controlled in the hosp w/ mBS~ 153-209 & A1c=7.4 ==> A1c is now 8.6 & we will refer to Endocrinology...    She saw [facility] 1/13 for f/u AFib, HBP, CHF> she was switched from Coumadin to XARELTO (due to the renal infarcts & difficulty maintaining INR... LABS 4/13:  FLP- fair on Simva40 w/ LDL 112;  Chems= ok x BS=208, A1c=8.6;  CBC- ok;  TSH=3.89;  VitD= 30;  VitB12=905   ~  August 19, 2011:  76mo ROV & Michelle Herrera says she was stung on her right hand by a "brown wasper"; she has been using cool compresses and Benedryl & is improved, she says;  No other complaints or concerns...     She is followed by DrTaylor for Cards> AFib, valv heart dis, HBP; on Atenolol 50mg Bid & Xarelto 20mg /d; she has occas sharp CP, palpit, SOB, but overall stable...    She is followed by DrEllison w/ DM on Metform500-2Bid & Glucotrol10Bid; last seen 6/13 & pt stopped Actos due to edema; Labs showed BS=163, A1c=7.5 which is sl improved from previous; rec same meds, better diet... We reviewed prob list, meds, xrays and labs> see below>> LABS 8/13:  Chems- ok x BS=163 A1c=7.5   ~  April 16, 2012:  55mo ROV & Michelle Herrera is c/o "bad sinus" w/ congestion, drainage, dizziness; we discussed Rx w/ Augmentin 875mg Bid, Mucinex 600mg -2Bid, +Fluids and nasal  saline mist every 1-2H;  She is also c/o vulvar burning & irritation related to her urinary incont & she needs f/u w/ Urology; she will take Diflucan100mg /d along w/ Align & Activia yogurt...  We reviewed the following medical problems during today's office visit >>     HBP> on Aten50Bid; BP= 130/84 & she denies CP/ SOB/ edema, notes occas palpit...    Cardiomyopathy, Valvular Dis> she restricts sodium, not on a diuretic; relatively asymptomatic, no overt CHF & chemistries are ok...    AFib> on Xarelto20 (samples from Cards); followed by DrTaylor but last seen 4/13 & stable- she will sched f/u ROV soon...    Chol> ?on Simva40?; weight is stable 134#, BMI=22; FLP 4/13 showed TChol 211, TG 77, HDL 85, LDL 112    DM w/ neuropathy> on Metform500-2Bid, Glipiz10Bid; followed by DrEllison & last seen 6/13- Actos stopped due to edema; she claims that BS higher when her knee is hurting; Labs 4/13 showed BS=208, A1c=8.6 (needs Endocrine f/u)...    GI- HH, colon polyps, s/p GB> stable she says, not on regular PPI dosing, last colon was 2003 by DrStark w/ 5mm polyp & divertics- she is due for GI f/u appt...    Urinary incont> followed by DrEskridge, small bladder capacity, known cystocele, trial of  Myrtetriq was unsuccessful...    DJD, LBP, right shoulder pain> on Neurontin100Tid; Ortho evals from DrNorris- prev knee surg for torn cartilage, back discomfort is improved she says...    Osteoporosis, Vit D defic> Vit D level 2013 = 30 and she was asked to incr her OTC Vit D supplement to 2000u daily...    Memory Loss> Hx 2 sm infarcts on prev MRI, on Xarelto from Cards; she declines Aricept etc...    Anxiety> mod stress in the family, she declines anxiolytic meds...    B12 defic> on OTC oral B12 supplement 1000u daily... We reviewed prob list, meds, xrays and labs> see below for updates >>  LABS 4/14:  Pending, she never returned for these labs...   ~  October 18, 2012:  24mo ROV & Michelle Herrera is here for a routine f/u,  notes her memory is slipping & we discussed starting Aricept5 but she declines;  We reviewed the following medical problems during today's office visit >>     HBP> on Aten50Bid, Losar25,  & Lasix20prn; BP= 134/80 & she denies CP/ SOB/ edema, notes occas palpit...    CAD, Cardiomyopathy (sys & diast CHF), Valvular Dis w/ MR> on ASA81, Xarelto, statin, etc; Adm 5/14 by [facility] w/ NSTEMI, Cath=> stent to 95%RCA; followed by DrTaylor...    AFib> on Xarelto20 (samples from Cards); followed by DrTaylor & cardiology notes are reviewed...    Chol> on Lip40 since 5/14; weight is stable 130#, BMI=22; FLP 10/14 shows TChol 138, TG 63, HDL 65, LDL 61    DM w/ neuropathy> on Metform500-2Bid, Glipiz10Bid; followed by DrEllison & last seen 6/13- Actos stopped due to edema; she claims that BS higher when her knee is hurting; Labs 10/14 shows BS=181, A1c=8.8 & we decided to incr Glipiz10-2Bid...    GI- HH, colon polyps, s/p GB> stable she says, not on regular PPI dosing, last colon was 2003 by DrStark w/ 5mm polyp & divertics; denies abd pain, n/v, d/c, blood seen...    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    DJD, LBP, right shoulder pain> on Neurontin100Tid; Ortho evals from DrNorris- prev knee surg for torn cartilage, back discomfort is improved she says...    Osteoporosis, Vit D defic> Vit D level 2013 = 30 and she was asked to incr her OTC Vit D supplement to 2000u daily...    Memory Loss> Hx 2 sm infarcts on prev MRI, on ASA/ Xarelto from Cards; she declines Aricept etc...    Anxiety> mod stress in the family, she declines anxiolytic meds...    B12 defic> on OTC oral B12 supplement 1000u daily... We reviewed prob list, meds, xrays and labs> see below for updates >> OK Flu shot today.... LABS 10/14:  FLP- looks good on Lip40;  Chems- ok x BS=181, A1c=8.8.Marland KitchenMarland Kitchen           Problem List:     HYPERTENSION - on  ATENOLOL 50mg  Bid... VALVULAR HEART DISEASE   ATRIAL FIBRILLATION - stable on Rx w/ XARELTO 20mg /d & rate control strategy. ~  4/11:  she reports that she stopped her Lanoxin 0.125 on her own & doesn't want to restart. ~  8/11:  f/u DrTaylor & stable on Coumadin w/ rate control strategy, no changes made. ~  11/11:  Hosp by Neuro w/ TIA> 2DEcho showed mild LVH, diffuse HK w/ EF= 45%, thick pos MV leaflet w/ restrict motion & modMR, dilated LA&RA, severeTR &  PAsys=49, can't r/o PFO... ~  8/12:  her BP=116/84 and she feels OK- denies HA, fatigue, visual changes, CP, palipit, dizziness, syncope, dyspnea, edema, etc... ~  12/12:  BP= 126/88 and she remains asymptomatic as above... ~  1/13:  2DEcho showed mild LVH w/ EF= 40-45% & diffuse HK, Diastolic dysfunction, modMR, severe LAE, modTR, Pasys=45 ~  She was switched from Coumadin to [facility] 1/13 due to the renal infarcts & difficulty maintaining INR. ~  CXR 1/13 showed marked cardiomegaly, pulm vasc congestion, NAD... ~  4/13:  BP= 128/60 and she is feeling better she says;  BUN=15, Creat=0.8; She had f/u DrTaylor> noted to be doing well w/o symptoms- continue same meds & low Na. ~  EKG 4/13 showed AFib, rate 77, right ward axis & NSSTTWA... ~  8/13:  BP= 110/82 and she denies current CP, palpit, SOB, edema... ~  4/14: on Aten50Bid; BP= 130/84 & she denies CP/ SOB/ edema, notes occas palpit. ~  5/14:  Adm by Cards> NSTEMI, Cath w/ LAD minor irreg only, LCirc w/ ostial 40-50%, mid RCA 95%, EF 55%. PCI: Vision BMS to the mid RCA. There was moderate MR on left ventriculogram;  ~  CXR 5/14 showed cardiomeg, underlying COPD w/ apical scarring, DJD in spine, NAD...  ~  EKG 6/14 showed AFib, rate72, NSSTTWA...  ~  2DEcho 6/14 showed norm LV size & funct w/ EF=55%, mild MR, severe RA&LA dil, modTR,   HYPERCHOLESTEROLEMIA - on diet + SIMVASTATIN 40mg /d... ~  FLP 2007 showed TChol 160, TG 143, HDL 55, LDL 76 ~  FLP 7/09 showed TChol 165, TG 129, HDL 60, LDL  80 ~  FLP 10/10 showed TChol 175, TG 68, HDL 80, LDL 82 ~  FLP 4/11 showed TChol 207, TG 104, HDL 84, LDL 111... rec better diet, continue same med. ~  FLP 11/11 on Simva40 showed TChol 180, TG 71, HDL 67, LDL 99 ~  FLP 8/12 on Simva40 showed TChol 196, TG 96, HDL 89, LDL 88 ~  FLP 4/13 on Simva40 showed TChol 211, TG 77, HDL 85, LDL 112... rec better diet, same med. ~  FLP 10/14 on Lip40 showed TChol 138, TG 63, HDL 65, LDL 61   DM - on METFORMIN 500mg - 2tabsBid, GLIPZIDE 10mg Bid (not taking Januvia due to $$)... ~  labs 1/09 showed BS= 148, HgA1c= 7.1.Marland KitchenMarland Kitchen continue meds + better diet... ~  labs 7/09 showed BS= 168, HgA1c= 7.6.Marland KitchenMarland Kitchen incr Metform 2Bid, contin Glip 10Bid & Januv... ~  she stopped the Januvia due to cost... ~  labs 4/10 showed BS= 185, Aic= 8.0.Marland KitchenMarland Kitchen reminder to take meds regularly. ~  labs 10/10 on Metform2Bid+Glip10Bid showed BS= 124, A1c= 6.7 ~  labs 4/11 showed BS= 162, A1c= 6.8.[name]. rec> better diet, take meds regularly. ~  labs 10/11 showed BS= 322, A1c= 8.3.Marland KitchenMarland Kitchen Pharm confirms not taking meds regularly!!! Discussed w/ pt... ~  Labs 4/12 showed BS= 91, A1c= 7.9.Marland KitchenMarland Kitchen Rec> keep same meds, take regularly, better low carb diet... ~  Labs 8/12 on Metform2Bid+Glip10Bid showed BS= 114, A1c= 7.3.Marland KitchenMarland Kitchen Continue same... ~  Labs in hosp 1/13 showed BS~ 153-209 & A1c=7.4... NOTE: she has refused other meds due to $$$ ~  Labs 4/13 on Metform2Bid+Glipiz10Bid showed BS= 208, A1c= 8.6... She is referred to Endocrinology ~  She saw DrEllison but refused Actos, refused Gliptins, etc... ~  Labs 8/13 on Metform2Bid+Glipiz10Bid showed BS= 163, A1c= 7.5.Marland KitchenMarland Kitchen Continue same + better diet... ~  Labs 10/14 on Metform2Bid+Glipiz10Bid showed BS= 181, A1c= 8.8.Marland KitchenMarland Kitchen rec  better diet & incr Glipiz10-2Bid...  HIATAL HERNIA (ICD-553.3) - last EGD by DrStark was 3/03 and showed a HH, reflux...  COLONIC POLYPS (ICD-211.3) - last colonoscopy 3/03 by DrStark showed divertics and 5mm polyp (no path avail).  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 pt reports that there is nothing they can do to help... ~  she has had several UTI's... then perineal rash & saw GYN= neg PAP & Rx yeast infection- resolved. ~  She will f/u w/ Urology for persistant symptoms, seen by DrEskridge w/ trial Myrbetriq but not much benefit per pt...  DEGENERATIVE JOINT DISEASE (ICD-715.90) - she needs right TKR but wants to put this off as long as poss... ~  4/12:  Ortho eval by DrNorris, hx prev knee arthroscopies, given Cortisone shot, & he plans ?Synvisc series... ~  9/12:  outpt knee surg to repair torn cartilage per DrNorris; preop clearance by DrTaylor & [facility]...  BACK PAIN, LUMBAR (ICD-724.2) - XRays showed scoliosis, facet degen arthritis, & osteopenia...  she has used ROBAXIN Prn... ~  4/12:  She reports eval from DrNorris w/ "arthritis all down my backbone" & he rec epid steroid injection, but she reports improved on NEURONTIN 100mg  Tid... ~  4/13:  She reports that back pain has improved but she still has some leg pain but notes that "it's tolerable"...  SHOULDER PAIN, RIGHT (ICD-719.41) - prev eval & Rx by DrRendall... s/p right shoulder surg 3/09... XRays showed calcium deposit, rotator cuff prob, & intol to Tramadol... ~  She tells me she wants to f/u w/ DrWhitfield for Ortho...  OSTEOPOROSIS (ICD-733.00) ~  labs 10/10 w/ Vit D level = 32... rec> start Vit D OTC 1000 u daily. ~  Labs 4/13 showed Vit D level = 30... rec to incr to 2000u daily...  MEMORY LOSS (ICD-780.93) - concern for her memory expressed 7/09 OV- tried Aricept but she stopped it- "no different". TIA/ INFARCT - she was [facility] 11/11 by DrWillis w/ left sided weakness, ?left facial droop, & MRI showed 2 sm infarcts on right (frontal & ?pontine);  MRA was neg;  CDopplers were neg;  Deficits all cleared rapidly;  2DEcho was abn- see above;  She was continued on her  Coumadin Rx- no changes made... ~  4/13:  She was offered memory medications but she declined...  ANXIETY (ICD-300.00) - stress in family... 2 y/o granddau w/ seizures...  VITAMIN B12 DEFICIENCY >> on OTC Vit B-12 tabs orally> 1072mcg/d... ~  Labs 7/11 showed Vit V12 level = 135... Started on Vit B12 supplement w/ 1071mcg/d... ~  Labs 4/13 showed Vit B12 level = 905... Ok top decr to 1/2 tab daily...   Past Surgical History  Procedure Laterality Date  . Cholecystectomy    . Rt.leg surgery  11/2010  . Cataract surgery/both eyes      01/2012 left eye---02/2012 right eye    Outpatient Encounter Prescriptions as of 10/18/2012  Medication Sig Dispense Refill  . aspirin 81 MG tablet Take 81 mg by mouth daily.      [name] atenolol (TENORMIN) 50 MG tablet Take 50 mg by mouth 2 (two) times daily.      [name] atorvastatin (LIPITOR) 40 MG tablet Take 1 tablet (40 mg total) by mouth daily at 6 PM.  30 tablet  6  . furosemide (LASIX) 20 MG tablet Take 1 tablet (20 mg total) by mouth daily.  30 tablet  11  .  gabapentin (NEURONTIN) 100 MG capsule Take 1 capsule (100 mg total) by mouth 3 (three) times daily.  90 capsule  5  . glipiZIDE (GLUCOTROL) 10 MG tablet Take 10 mg by mouth 2 (two) times daily before a meal.      . losartan (COZAAR) 25 MG tablet Take 1 tablet (25 mg total) by mouth daily.  30 tablet  6  . metFORMIN (GLUCOPHAGE) 500 MG tablet TAKE TWO TABLETS BY MOUTH TWICE DAILY WITH MEALS  120 tablet  6  . Rivaroxaban (XARELTO) 20 MG TABS Take 20 mg by mouth every evening.      . vitamin B-12 (CYANOCOBALAMIN) 1000 MCG tablet Take 1,000 mcg by mouth every morning.       . [DISCONTINUED] metFORMIN (GLUCOPHAGE) 500 MG tablet Take 2 tablets (1,000 mg total) by mouth 2 (two) times daily with a meal.       No facility-administered encounter medications on file as of 10/18/2012.     Allergies  Allergen Reactions  . Peanut-Containing Drug Products Anaphylaxis and Swelling  . Azithromycin     REACTION: pt  states INTOL to ZPak  . Flomax [Tamsulosin Hcl] Swelling    swelling  . Pioglitazone     edema  . Pregabalin     REACTION: pt states INTOL to Lyrica  . Sulfonamide Derivatives Swelling    REACTION: SWELLING    Review of Systems        See HPI - all other systems neg except as noted... The patient complains of weight loss, chest pain, dyspnea on exertion, and muscle weakness.  The patient denies anorexia, fever, weight gain, vision loss, decreased hearing, hoarseness, syncope, peripheral edema, prolonged cough, headaches, hemoptysis, abdominal pain, melena, hematochezia, severe indigestion/heartburn, hematuria, incontinence, suspicious skin lesions, transient blindness, difficulty walking, depression, unusual weight change, abnormal bleeding, enlarged lymph nodes, and angioedema.     Objective:   Physical Exam      WD, WN, 79 y/o WF in NAD...  GENERAL:  Alert & oriented; pleasant & cooperative... HEENT:  [place]/AT, EOM-wnl, PERRLA, EACs-clear, TMs-wnl, NOSE-clear, THROAT-clear & wnl. NECK:  Supple w/ fairROM; no JVD; normal carotid impulses w/o bruits; no thyromegaly or nodules palpated; no lymphadenopathy. CHEST:  Clear to P & A; without wheezes/ rales/ or rhonchi; +tender left 11th-12th ribs & costal margin. HEART:  Iregular Rhythm= AFib; without murmurs/ rubs/ or gallops heard... ABDOMEN:  Soft & nontender; normal bowel sounds; no organomegaly or masses detected. EXT: without deformities, mild arthritic changes; no varicose veins/ venous insuffic/ or edema. NEURO:  CN's intact;  no focal neuro deficits found... DERM:  No lesions noted; no rash etc...  RADIOLOGY DATA:  Reviewed in the EPIC EMR & discussed w/ the patient...  LABORATORY DATA:  Reviewed in the EPIC EMR & discussed w/ the patient...   Assessment & Plan:    HBP>  Stable on BBlocker, low dose ARB, Lasix prn; continue same...  CAD>  [facility] 5/14 for NSTEMI=> stent to RCA, meds adjusted 7 followed by DrTaylor...  AFIB>   She had abn 2DEcho in [facility] 11/11 & 1/13- reviewed by Cards- she sees DrTaylor et al & on XARELTO...  CHOL>  FLP looked fair on the Simva40 + diet rx; switched to Lip40 during the 5/14 [facility] for NSTEMI...  DM>  A1c is 8.8 now on same meds- on max Metform & refuses to take $$ meds, therefore we will incr Glipiz10 to 2Bid & stress diet rx...  RENAL INFARCTION>  Adm 1/13 w/ renal infarcts felt  due to micro-emboli & inadeq INR ==> changed to XARELTO... URINARY INCONTINENCE>  Followed by Lysbeth Penner now Eskridge & she feels that there is nothing they can do to help her; wears pads; offered second opinion & she will consider it...  ORTHO>  Followed by DrNorris & she is s/p cortisone shots in right knee & ?Synvisc series; she had arthroscopic surg for torn cartilage 9/12 she says & she doesn't want TKR.  STROKE>  She has had a CVA w/ hosp by Neuro 11/11 [facility] records all reviewed);  She was continued on her Coumadin per DrWillis & followed in [facility];  Stable w/o recurrent cerebral ischemic symptoms...  Other medical problems as noted... She will continue the Calcium, MVI, Vit D, & Vit B12 regimen...   Patient's Medications  New Prescriptions   No medications on file  Previous Medications   ASPIRIN 81 MG TABLET    Take 81 mg by mouth daily.   ATENOLOL (TENORMIN) 50 MG TABLET    Take 50 mg by mouth 2 (two) times daily.   ATORVASTATIN (LIPITOR) 40 MG TABLET    Take 1 tablet (40 mg total) by mouth daily at 6 PM.   FUROSEMIDE (LASIX) 20 MG TABLET    Take 1 tablet (20 mg total) by mouth daily.   GABAPENTIN (NEURONTIN) 100 MG CAPSULE    Take 1 capsule (100 mg total) by mouth 3 (three) times daily.   GLIPIZIDE (GLUCOTROL) 10 MG TABLET    Take 10 mg by mouth 2 (two) times daily before a meal.   LOSARTAN (COZAAR) 25 MG TABLET    Take 1 tablet (25 mg total) by mouth daily.   METFORMIN (GLUCOPHAGE) 500 MG TABLET    TAKE TWO TABLETS BY MOUTH TWICE DAILY WITH MEALS   RIVAROXABAN (XARELTO) 20 MG TABS    Take 20  mg by mouth every evening.   VITAMIN B-12 (CYANOCOBALAMIN) 1000 MCG TABLET    Take 1,000 mcg by mouth every morning.   Modified Medications   No medications on file  Discontinued Medications   METFORMIN (GLUCOPHAGE) 500 MG TABLET    Take 2 tablets (1,000 mg total) by mouth 2 (two) times daily with a meal.

## 2012-10-22 ENCOUNTER — Other Ambulatory Visit: Payer: Self-pay | Admitting: Pulmonary Disease

## 2012-10-22 MED ORDER — GLIPIZIDE 10 MG PO TABS: ORAL_TABLET | ORAL | Status: DC

## 2012-11-07 ENCOUNTER — Telehealth: Payer: Self-pay

## 2012-11-07 NOTE — Telephone Encounter (Signed)
samples

## 2012-11-10 ENCOUNTER — Other Ambulatory Visit: Payer: Self-pay | Admitting: Internal Medicine

## 2012-11-27 ENCOUNTER — Emergency Department (HOSPITAL_COMMUNITY)
Admission: EM | Admit: 2012-11-27 | Discharge: 2012-11-27 | Disposition: A | Discharge status: Home / Self Care | Payer: Medicare Other | Attending: Emergency Medicine | Admitting: Emergency Medicine

## 2012-11-27 ENCOUNTER — Encounter (HOSPITAL_COMMUNITY): Payer: Self-pay | Admitting: Emergency Medicine

## 2012-11-27 DIAGNOSIS — Z87448 Personal history of other diseases of urinary system: Secondary | ICD-10-CM | POA: Insufficient documentation

## 2012-11-27 DIAGNOSIS — Z7982 Long term (current) use of aspirin: Secondary | ICD-10-CM | POA: Insufficient documentation

## 2012-11-27 DIAGNOSIS — M1990 Unspecified osteoarthritis, unspecified site: Secondary | ICD-10-CM | POA: Insufficient documentation

## 2012-11-27 DIAGNOSIS — Z8673 Personal history of transient ischemic attack (TIA), and cerebral infarction without residual deficits: Secondary | ICD-10-CM | POA: Insufficient documentation

## 2012-11-27 DIAGNOSIS — Z8719 Personal history of other diseases of the digestive system: Secondary | ICD-10-CM | POA: Insufficient documentation

## 2012-11-27 DIAGNOSIS — F411 Generalized anxiety disorder: Secondary | ICD-10-CM | POA: Insufficient documentation

## 2012-11-27 DIAGNOSIS — Z79899 Other long term (current) drug therapy: Secondary | ICD-10-CM | POA: Insufficient documentation

## 2012-11-27 DIAGNOSIS — I252 Old myocardial infarction: Secondary | ICD-10-CM | POA: Insufficient documentation

## 2012-11-27 DIAGNOSIS — E7800 Pure hypercholesterolemia, unspecified: Secondary | ICD-10-CM | POA: Insufficient documentation

## 2012-11-27 DIAGNOSIS — I4891 Unspecified atrial fibrillation: Secondary | ICD-10-CM | POA: Insufficient documentation

## 2012-11-27 DIAGNOSIS — E119 Type 2 diabetes mellitus without complications: Secondary | ICD-10-CM | POA: Insufficient documentation

## 2012-11-27 DIAGNOSIS — I5042 Chronic combined systolic (congestive) and diastolic (congestive) heart failure: Secondary | ICD-10-CM | POA: Insufficient documentation

## 2012-11-27 DIAGNOSIS — I10 Essential (primary) hypertension: Secondary | ICD-10-CM | POA: Insufficient documentation

## 2012-11-27 DIAGNOSIS — Z7901 Long term (current) use of anticoagulants: Secondary | ICD-10-CM | POA: Insufficient documentation

## 2012-11-27 DIAGNOSIS — R040 Epistaxis: Secondary | ICD-10-CM | POA: Insufficient documentation

## 2012-11-27 DIAGNOSIS — I2510 Atherosclerotic heart disease of native coronary artery without angina pectoris: Secondary | ICD-10-CM | POA: Insufficient documentation

## 2012-11-27 LAB — CBC
HCT: 33.7 % — ABNORMAL LOW (ref 36.0–46.0)
Hemoglobin: 11.7 g/dL — ABNORMAL LOW (ref 12.0–15.0)
MCH: 32.6 pg (ref 26.0–34.0)
MCHC: 34.7 g/dL (ref 30.0–36.0)
MCV: 93.9 fL (ref 78.0–100.0)
Platelets: 203 10*3/uL (ref 150–400)
RBC: 3.59 MIL/uL — ABNORMAL LOW (ref 3.87–5.11)
RDW: 13.9 % (ref 11.5–15.5)
WBC: 5.4 10*3/uL (ref 4.0–10.5)

## 2012-11-27 LAB — PROTIME-INR
INR: 1.7 — ABNORMAL HIGH (ref 0.00–1.49)
Prothrombin Time: 19.5 seconds — ABNORMAL HIGH (ref 11.6–15.2)

## 2012-11-27 NOTE — ED Provider Notes (Signed)
CSN: [id number]     Arrival date & time 11/27/12  1447 History   First MD Initiated Contact with Patient 11/27/12 1550     Chief Complaint  Patient presents with  . Epistaxis   (Consider location/radiation/quality/duration/timing/severity/associated sxs/prior Treatment) The history is provided by the patient, medical records and a relative. No language interpreter was used.    Michelle Herrera is a 79 y.o. female  with a hx of a-fib on Xarelto, HTN, DM type 2, CAD presents to the Emergency Department complaining of acute, persistent, epistaxis onset 1 hour PTA. Pt has attempted to hold pressure without relief from the bleeding.  She denies known trauma to the nose.  She for epistaxis returns after she blows her nose. Nothing makes it better and blowing her nose makes it worse.  Pt denies fever, chills, headache, neck pain, chest pain, shortness of breath, abdominal pain, nausea, vomiting, diarrhea weakness, dizziness, syncope.   Past Medical History  Diagnosis Date  . HTN (hypertension)   . Pure hypercholesterolemia   . Type II or unspecified type diabetes mellitus without mention of complication, not stated as uncontrolled   . Anxiety state, unspecified   . Diaphragmatic hernia without mention of obstruction or gangrene   . Benign neoplasm of colon   . Mixed incontinence urge and stress (female)(female)   . Osteoarthrosis, unspecified whether generalized or localized, unspecified site   . Lumbago   . Osteoporosis, unspecified   . Memory loss   . Renal infarction     01/2011 in setting of low INR  . History of stroke   . AF (atrial fibrillation)     a. Chronic Xarelto  . CAD (coronary artery disease)     a. 05/2012 NSTEMI/Cath/PCI: LM nl, LAD min irregs, LCX min irregs, RI 40-50 ost, OM1 nl, RCA dom, 11m (4.0x12 Vision BMS), PD/PL nl, EF 55%, at least mod MR.  . Mitral regurgitation     a. mild to mod by echo 01/2011;  b. felt to be at least mod by LV gram 05/2012.  [name] Chronic combined  systolic and diastolic CHF (congestive heart failure)     a. 01/2011 Echo: EF 40-45%, diff HK, diast dysfxn, mild to mod MR, mod to sev dil LA/RV/RA, mod to sev reduced RV fxn, mod TR, PASP .;  b.  Echo 06/15/12:  EF 55%, mild MR, severe BAE, mod TR, PASP 35   Past Surgical History  Procedure Laterality Date  . Cholecystectomy    . Rt.leg surgery  11/2010  . Cataract surgery/both eyes      01/2012 left eye---02/2012 right eye   Family History  Problem Relation Age of Onset  . Stomach cancer Father   . Pneumonia Mother   . CAD Brother    History  Substance Use Topics  . Smoking status: Never Smoker   . Smokeless tobacco: Never Used  . Alcohol Use: No   OB History   Grav Para Term Preterm Abortions TAB SAB Ect Mult Living                 Review of Systems  Constitutional: Negative for fever, diaphoresis, appetite change, fatigue and unexpected weight change.  HENT: Positive for nosebleeds. Negative for mouth sores.   Eyes: Negative for visual disturbance.  Respiratory: Negative for cough, chest tightness, shortness of breath and wheezing.   Cardiovascular: Negative for chest pain.  Gastrointestinal: Negative for nausea, vomiting, abdominal pain, diarrhea and constipation.  Endocrine: Negative for polydipsia, polyphagia and polyuria.  Genitourinary: Negative for dysuria, urgency, frequency and hematuria.  Musculoskeletal: Negative for back pain and neck stiffness.  Skin: Negative for rash.  Allergic/Immunologic: Negative for immunocompromised state.  Neurological: Negative for syncope, light-headedness and headaches.  Hematological: Does not bruise/bleed easily.  Psychiatric/Behavioral: Negative for sleep disturbance. The patient is not nervous/anxious.     Allergies  Peanut-containing drug products; Azithromycin; Flomax; Pioglitazone; Pregabalin; and Sulfonamide derivatives  Home Medications   Current Outpatient Rx  Name  Route  Sig  Dispense  Refill  . acetaminophen  (TYLENOL) 500 MG tablet   Oral   Take 500 mg by mouth every 6 (six) hours as needed.         [name] aspirin 81 MG tablet   Oral   Take 81 mg by mouth daily.         [name] atenolol (TENORMIN) 50 MG tablet   Oral   Take 50 mg by mouth 2 (two) times daily.         [name] atorvastatin (LIPITOR) 40 MG tablet   Oral   Take 1 tablet (40 mg total) by mouth daily at 6 PM.   30 tablet   6   . gabapentin (NEURONTIN) 100 MG capsule   Oral   Take 1 capsule (100 mg total) by mouth 3 (three) times daily.   90 capsule   5   . glipiZIDE (GLUCOTROL) 10 MG tablet      Take 2 tablets by mouth two times daily   120 tablet   6   . metFORMIN (GLUCOPHAGE) 500 MG tablet      TAKE TWO TABLETS BY MOUTH TWICE DAILY WITH MEALS   120 tablet   6   . metFORMIN (GLUCOPHAGE) 500 MG tablet   Oral   Take 500 mg by mouth 2 (two) times daily with a meal.         . Rivaroxaban (XARELTO) 20 MG TABS   Oral   Take 20 mg by mouth every evening.         . vitamin B-12 (CYANOCOBALAMIN) 1000 MCG tablet   Oral   Take 1,000 mcg by mouth every morning.           BP 157/94  Pulse 98  Temp(Src) 98 F (36.7 C) (Oral)  Resp 16  SpO2 99% Physical Exam  Nursing note and vitals reviewed. Constitutional: She is oriented to person, place, and time. She appears well-developed and well-nourished. No distress.  Awake, alert, nontoxic appearance  HENT:  Head: Normocephalic and atraumatic.  Right Ear: Tympanic membrane, external ear and ear canal normal.  Left Ear: Tympanic membrane, external ear and ear canal normal.  Nose: No mucosal edema or rhinorrhea. Epistaxis is observed. Right sinus exhibits no maxillary sinus tenderness and no frontal sinus tenderness. Left sinus exhibits no maxillary sinus tenderness and no frontal sinus tenderness.  Mouth/Throat: Uvula is midline, oropharynx is clear and moist and mucous membranes are normal. Mucous membranes are not pale and not cyanotic. No oropharyngeal exudate,  posterior oropharyngeal edema, posterior oropharyngeal erythema or tonsillar abscesses.  Small abrasion to the anterior septum of the right nare  Eyes: Conjunctivae are normal. Pupils are equal, round, and reactive to light. No scleral icterus.  Neck: Normal range of motion and full passive range of motion without pain. Neck supple.  Cardiovascular: Normal rate, regular rhythm, normal heart sounds and intact distal pulses.   No murmur heard. Pulmonary/Chest: Effort normal and breath sounds normal. No stridor. No respiratory distress. She has no  wheezes.  Abdominal: Soft. Bowel sounds are normal. She exhibits no mass. There is no tenderness. There is no rebound and no guarding.  Musculoskeletal: Normal range of motion. She exhibits no edema.  Lymphadenopathy:    She has no cervical adenopathy.  Neurological: She is alert and oriented to person, place, and time.  Speech is clear and goal oriented Moves extremities without ataxia  Skin: Skin is warm and dry. No rash noted. She is not diaphoretic.  Psychiatric: She has a normal mood and affect.    ED Course  Cauterization Date/Time: 11/27/2012 4:40 PM Performed by: Dierdre Forth Authorized by: Dierdre Forth Consent: Verbal consent obtained. Risks and benefits: risks, benefits and alternatives were discussed Consent given by: patient Patient understanding: patient states understanding of the procedure being performed Patient consent: the patient's understanding of the procedure matches consent given Procedure consent: procedure consent matches procedure scheduled Relevant documents: relevant documents present and verified Test results: test results available and properly labeled Site marked: the operative site was marked Required items: required blood products, implants, devices, and special equipment available Patient identity confirmed: verbally with patient and arm band Time out: Immediately prior to procedure a "time  out" was called to verify the correct patient, procedure, equipment, support staff and site/side marked as required. Preparation: Patient was prepped and draped in the usual sterile fashion. Local anesthesia used: no Patient sedated: no Patient tolerance: Patient tolerated the procedure well with no immediate complications. Comments: Cauterization of visible anterior nose bleed in the right nare without complication.   (including critical care time) Labs Review Labs Reviewed  CBC - Abnormal; Notable for the following:    RBC 3.59 (*)    Hemoglobin 11.7 (*)    HCT 33.7 (*)    All other components within normal limits  PROTIME-INR - Abnormal; Notable for the following:    Prothrombin Time 19.5 (*)    INR 1.70 (*)    All other components within normal limits   Imaging Review No results found.  EKG Interpretation   None       MDM   1. Anterior epistaxis   2. Anticoagulant long-term use      Michelle Herrera presents with epistaxis on long-term use of Xarelto.  Patient denies trauma on exam and abrasion is seen to the anterior septum of the right near. Cauterization with silver nitrate and further pressure. Epistaxis resolved without complication. PT/INR not significantly elevated at this time. Request patient followup with primary care tomorrow for reevaluation of Xarelto management and reevaluation of her nose.  Patient without significant hypertension to cause nosebleeds.  It has been determined that no acute conditions requiring further emergency intervention are present at this time. The patient/guardian have been advised of the diagnosis and plan. We have discussed signs and symptoms that warrant return to the ED, such as changes or worsening in symptoms.   Vital signs are stable at discharge.   BP 157/94  Pulse 98  Temp(Src) 98 F (36.7 C) (Oral)  Resp 16  SpO2 99%  Patient/guardian has voiced understanding and agreed to follow-up with the PCP or  specialist.  Patient discussed with and seen by Dr. Juleen China who agrees with the plan to discharge.     Dahlia Client Niang [place], PA-C 11/27/12 1736

## 2012-11-27 NOTE — ED Notes (Signed)
Pt brought to ed with c/o nose bleed x 1 hour. Pt is on blood thinners.

## 2012-11-28 ENCOUNTER — Telehealth: Payer: Self-pay | Admitting: Pulmonary Disease

## 2012-11-28 DIAGNOSIS — R040 Epistaxis: Secondary | ICD-10-CM

## 2012-11-28 DIAGNOSIS — I4891 Unspecified atrial fibrillation: Secondary | ICD-10-CM

## 2012-11-28 NOTE — ED Provider Notes (Signed)
Medical screening examination/treatment/procedure(s) were conducted as a shared visit with non-physician practitioner(s) and myself.  I personally evaluated the patient during the encounter.  EKG Interpretation   None      79 year old female with epistaxis on xarleto. Stopped with silver nitrate cautery. No continued bleeding. No dizziness, lightheadedness or shortness of breath. Return precautions discussed.  Raeford Razor, MD 11/28/12 1534

## 2012-11-28 NOTE — Telephone Encounter (Signed)
ED note printed with phone note. Please advise SN thanks

## 2012-11-29 NOTE — Telephone Encounter (Signed)
Per SN--  Pt will need to see ENT ASAP.  Order has been placed.  Pt will need to see Dr. Ladona Ridgel for afib on xarelto.  Order has been placed.   i attempted to call pt but no answer and no machine.

## 2012-11-30 NOTE — Telephone Encounter (Signed)
Pt advised. Jennifer Castillo, CMA  

## 2012-12-03 ENCOUNTER — Other Ambulatory Visit: Payer: Self-pay | Admitting: Otolaryngology

## 2012-12-03 DIAGNOSIS — R590 Localized enlarged lymph nodes: Secondary | ICD-10-CM

## 2012-12-04 ENCOUNTER — Ambulatory Visit (INDEPENDENT_AMBULATORY_CARE_PROVIDER_SITE_OTHER): Payer: Medicare Other | Admitting: Internal Medicine

## 2012-12-04 ENCOUNTER — Encounter: Payer: Self-pay | Admitting: Internal Medicine

## 2012-12-04 VITALS — BP 160/90 | HR 92 | Wt 126.0 lb

## 2012-12-04 DIAGNOSIS — I4891 Unspecified atrial fibrillation: Secondary | ICD-10-CM

## 2012-12-04 DIAGNOSIS — I10 Essential (primary) hypertension: Secondary | ICD-10-CM

## 2012-12-04 DIAGNOSIS — R040 Epistaxis: Secondary | ICD-10-CM

## 2012-12-04 MED ORDER — RIVAROXABAN 15 MG PO TABS: 15.0000 mg | ORAL_TABLET | Freq: Every day | ORAL | Status: DC

## 2012-12-04 NOTE — Assessment & Plan Note (Signed)
Her blood pressure is elevated today. I've asked the patient to reduce her salt intake. She will have her medications adjusted,  If her blood pressure remains elevated.

## 2012-12-04 NOTE — Assessment & Plan Note (Signed)
With the patient's nosebleed, I've recommended that she hold her baby aspirin, and reduce her dose of Xarelto to 15 mg daily.

## 2012-12-04 NOTE — Patient Instructions (Signed)
Your physician wants you to follow-up in: 6 months with Dr Court Joy will receive a reminder letter in the mail two months in advance. If you don't receive a letter, please call our office to schedule the follow-up appointment.   Your physician has recommended you make the following change in your medication:  1) Stop Aspirin 2) Decrease Xarelto to 15mg  daily

## 2012-12-04 NOTE — Progress Notes (Signed)
HPI Michelle Herrera returns today for followup. She is a pleasant 79 yo woman with a h/o atrial fibrillation, mild coronary artery disease, pulmonary hypertension, biventricular heart failure, who is seen in the emergency room several days ago with a severe nosebleed, requiring electrocautery. She is not change medications. She denies chest pain or shortness of breath. She notes one episode where she tripped and fell. No syncope. Allergies  Allergen Reactions  . Peanut-Containing Drug Products Anaphylaxis and Swelling  . Azithromycin     REACTION: pt states INTOL to ZPak  . Flomax [Tamsulosin Hcl] Swelling    swelling  . Pioglitazone     edema  . Pregabalin     REACTION: pt states INTOL to Lyrica  . Sulfonamide Derivatives Swelling    REACTION: SWELLING     Current Outpatient Prescriptions  Medication Sig Dispense Refill  . acetaminophen (TYLENOL) 500 MG tablet Take 500 mg by mouth every 6 (six) hours as needed.      [name] atenolol (TENORMIN) 50 MG tablet Take 50 mg by mouth 2 (two) times daily.      [name] atorvastatin (LIPITOR) 40 MG tablet Take 1 tablet (40 mg total) by mouth daily at 6 PM.  30 tablet  6  . gabapentin (NEURONTIN) 100 MG capsule Take 1 capsule (100 mg total) by mouth 3 (three) times daily.  90 capsule  5  . glipiZIDE (GLUCOTROL) 10 MG tablet Take 2 tablets by mouth two times daily  120 tablet  6  . metFORMIN (GLUCOPHAGE) 500 MG tablet TAKE TWO TABLETS BY MOUTH TWICE DAILY WITH MEALS  120 tablet  6  . metFORMIN (GLUCOPHAGE) 500 MG tablet Take 500 mg by mouth 2 (two) times daily with a meal.      . vitamin B-12 (CYANOCOBALAMIN) 1000 MCG tablet Take 1,000 mcg by mouth every morning.       . Rivaroxaban (XARELTO) 15 MG TABS tablet Take 1 tablet (15 mg total) by mouth daily.  30 tablet  11   No current facility-administered medications for this visit.     Past Medical History  Diagnosis Date  . HTN (hypertension)   . Pure hypercholesterolemia   . Type II or  unspecified type diabetes mellitus without mention of complication, not stated as uncontrolled   . Anxiety state, unspecified   . Diaphragmatic hernia without mention of obstruction or gangrene   . Benign neoplasm of colon   . Mixed incontinence urge and stress (female)(female)   . Osteoarthrosis, unspecified whether generalized or localized, unspecified site   . Lumbago   . Osteoporosis, unspecified   . Memory loss   . Renal infarction     01/2011 in setting of low INR  . History of stroke   . AF (atrial fibrillation)     a. Chronic Xarelto  . CAD (coronary artery disease)     a. 05/2012 NSTEMI/Cath/PCI: LM nl, LAD min irregs, LCX min irregs, RI 40-50 ost, OM1 nl, RCA dom, 10m (4.0x12 Vision BMS), PD/PL nl, EF 55%, at least mod MR.  . Mitral regurgitation     a. mild to mod by echo 01/2011;  b. felt to be at least mod by LV gram 05/2012.  [name] Chronic combined systolic and diastolic CHF (congestive heart failure)     a. 01/2011 Echo: EF 40-45%, diff HK, diast dysfxn, mild to mod MR, mod to sev dil LA/RV/RA, mod to sev reduced RV fxn, mod TR, PASP .;  b.  Echo 06/15/12:  EF 55%, mild MR, severe BAE, mod TR, PASP 35    ROS:   All systems reviewed and negative except as noted in the HPI.   Past Surgical History  Procedure Laterality Date  . Cholecystectomy    . Rt.leg surgery  11/2010  . Cataract surgery/both eyes      01/2012 left eye---02/2012 right eye     Family History  Problem Relation Age of Onset  . Stomach cancer Father   . Pneumonia Mother   . CAD Brother      History   Social History  . Marital Status: Widowed    Spouse Name: N/A    Number of Children: 6  . Years of Education: N/A   Occupational History  .     Social History Main Topics  . Smoking status: Never Smoker   . Smokeless tobacco: Never Used  . Alcohol Use: No  . Drug Use: No  . Sexual Activity: No   Other Topics Concern  . Not on file   Social History Narrative   1 sister--in poor health     1 brother hx of kidney stones and heart problems     BP 160/90  Pulse 92  Wt 126 lb (57.153 kg)  Physical Exam:  Well appearing elderly woman, NAD HEENT: Unremarkable Neck:  7 cm JVD, no thyromegally Back:  No CVA tenderness Lungs:  Clear with no wheezes, rales, or rhonchi. HEART:  Regular rate rhythm, no murmurs, no rubs, no clicks Abd:  soft, positive bowel sounds, no organomegally, no rebound, no guarding Ext:  2 plus pulses, trace peripheral edema, no cyanosis, no clubbing Skin:  No rashes no nodules Neuro:  CN II through XII intact, motor grossly intact   Assess/Plan:

## 2012-12-04 NOTE — Assessment & Plan Note (Signed)
her ventricular rate appears to be well-controlled. I've recommended she decrease her dose of Xarelto to 15 mg daily.

## 2012-12-05 ENCOUNTER — Ambulatory Visit
Admission: RE | Admit: 2012-12-05 | Discharge: 2012-12-05 | Disposition: A | Discharge status: Home / Self Care | Payer: Medicare Other | Source: Ambulatory Visit | Attending: Otolaryngology | Admitting: Otolaryngology

## 2012-12-05 DIAGNOSIS — R590 Localized enlarged lymph nodes: Secondary | ICD-10-CM

## 2012-12-05 MED ORDER — IOHEXOL 300 MG/ML  SOLN
75.0000 mL | Freq: Once | INTRAMUSCULAR | Status: AC | PRN
  Administered 2012-12-05: 75 mL via INTRAVENOUS

## 2013-01-14 ENCOUNTER — Telehealth: Payer: Self-pay | Admitting: Pulmonary Disease

## 2013-01-14 NOTE — Telephone Encounter (Signed)
Per TP patient sees Dr. Lovena Le with cardiology. She needs to f/u with them about these symptoms. Pt advised. [place], CMA

## 2013-01-14 NOTE — Telephone Encounter (Signed)
Called and spoke with pt and she stated that when she lays down to sleep at night she stated that her heart is racing and she feels SOB.  This has been going on x 2-3 weeks.  She stated that this is staying with her at night and sometimes she can sleep but sometimes she cant.  She stated that sometimes she will have the heart racing and SOB during the day but it does not seem to be that bad.  Pt is requesting recs from SN.  Please advise. Thanks  Allergies  Allergen Reactions  . Peanut-Containing Drug Products Anaphylaxis and Swelling  . Azithromycin     REACTION: pt states INTOL to ZPak  . Flomax [Tamsulosin Hcl] Swelling    swelling  . Pioglitazone     edema  . Pregabalin     REACTION: pt states INTOL to Lyrica  . Sulfonamide Derivatives Swelling    REACTION: SWELLING     Current Outpatient Prescriptions on File Prior to Visit  Medication Sig Dispense Refill  . acetaminophen (TYLENOL) 500 MG tablet Take 500 mg by mouth every 6 (six) hours as needed.      [name] atenolol (TENORMIN) 50 MG tablet Take 50 mg by mouth 2 (two) times daily.      [name] atorvastatin (LIPITOR) 40 MG tablet Take 1 tablet (40 mg total) by mouth daily at 6 PM.  30 tablet  6  . gabapentin (NEURONTIN) 100 MG capsule Take 1 capsule (100 mg total) by mouth 3 (three) times daily.  90 capsule  5  . glipiZIDE (GLUCOTROL) 10 MG tablet Take 2 tablets by mouth two times daily  120 tablet  6  . metFORMIN (GLUCOPHAGE) 500 MG tablet TAKE TWO TABLETS BY MOUTH TWICE DAILY WITH MEALS  120 tablet  6  . metFORMIN (GLUCOPHAGE) 500 MG tablet Take 500 mg by mouth 2 (two) times daily with a meal.      . Rivaroxaban (XARELTO) 15 MG TABS tablet Take 1 tablet (15 mg total) by mouth daily.  30 tablet  11  . vitamin B-12 (CYANOCOBALAMIN) 1000 MCG tablet Take 1,000 mcg by mouth every morning.        No current facility-administered medications on file prior to visit.

## 2013-01-15 ENCOUNTER — Telehealth: Payer: Self-pay | Admitting: Internal Medicine

## 2013-01-15 NOTE — Telephone Encounter (Signed)
New Message  Pt called states that she has had recent problems with breathing and her heart.She says that she is experiencing very fast heartbeats//Made appt with Scott weaver on 01/22/2013. Pt requests a call back to discuss.

## 2013-01-16 NOTE — Telephone Encounter (Signed)
Had few spells with her heart last weeka nd feeling good now.  She si going to keep her follow up as scheduled

## 2013-01-20 ENCOUNTER — Observation Stay (HOSPITAL_COMMUNITY)
Admission: EM | Admit: 2013-01-20 | Discharge: 2013-01-22 | Disposition: A | Discharge status: Home / Self Care | Payer: Medicare Other | Attending: Family Medicine | Admitting: Family Medicine

## 2013-01-20 ENCOUNTER — Emergency Department (HOSPITAL_COMMUNITY): Payer: Medicare Other

## 2013-01-20 ENCOUNTER — Encounter (HOSPITAL_COMMUNITY): Payer: Self-pay | Admitting: Emergency Medicine

## 2013-01-20 DIAGNOSIS — I2510 Atherosclerotic heart disease of native coronary artery without angina pectoris: Secondary | ICD-10-CM | POA: Insufficient documentation

## 2013-01-20 DIAGNOSIS — E538 Deficiency of other specified B group vitamins: Secondary | ICD-10-CM

## 2013-01-20 DIAGNOSIS — E785 Hyperlipidemia, unspecified: Secondary | ICD-10-CM | POA: Insufficient documentation

## 2013-01-20 DIAGNOSIS — Z79899 Other long term (current) drug therapy: Secondary | ICD-10-CM | POA: Insufficient documentation

## 2013-01-20 DIAGNOSIS — K449 Diaphragmatic hernia without obstruction or gangrene: Secondary | ICD-10-CM

## 2013-01-20 DIAGNOSIS — R079 Chest pain, unspecified: Secondary | ICD-10-CM

## 2013-01-20 DIAGNOSIS — E7800 Pure hypercholesterolemia, unspecified: Secondary | ICD-10-CM

## 2013-01-20 DIAGNOSIS — Z8673 Personal history of transient ischemic attack (TIA), and cerebral infarction without residual deficits: Secondary | ICD-10-CM

## 2013-01-20 DIAGNOSIS — M1990 Unspecified osteoarthritis, unspecified site: Secondary | ICD-10-CM

## 2013-01-20 DIAGNOSIS — I4891 Unspecified atrial fibrillation: Secondary | ICD-10-CM

## 2013-01-20 DIAGNOSIS — I10 Essential (primary) hypertension: Secondary | ICD-10-CM

## 2013-01-20 DIAGNOSIS — R7309 Other abnormal glucose: Secondary | ICD-10-CM

## 2013-01-20 DIAGNOSIS — D126 Benign neoplasm of colon, unspecified: Secondary | ICD-10-CM

## 2013-01-20 DIAGNOSIS — I509 Heart failure, unspecified: Principal | ICD-10-CM

## 2013-01-20 DIAGNOSIS — R0602 Shortness of breath: Secondary | ICD-10-CM | POA: Insufficient documentation

## 2013-01-20 DIAGNOSIS — R29818 Other symptoms and signs involving the nervous system: Secondary | ICD-10-CM | POA: Insufficient documentation

## 2013-01-20 DIAGNOSIS — N3946 Mixed incontinence: Secondary | ICD-10-CM

## 2013-01-20 DIAGNOSIS — R269 Unspecified abnormalities of gait and mobility: Secondary | ICD-10-CM

## 2013-01-20 DIAGNOSIS — R739 Hyperglycemia, unspecified: Secondary | ICD-10-CM

## 2013-01-20 DIAGNOSIS — F411 Generalized anxiety disorder: Secondary | ICD-10-CM

## 2013-01-20 DIAGNOSIS — I4821 Permanent atrial fibrillation: Secondary | ICD-10-CM | POA: Diagnosis present

## 2013-01-20 DIAGNOSIS — R002 Palpitations: Secondary | ICD-10-CM | POA: Insufficient documentation

## 2013-01-20 DIAGNOSIS — E875 Hyperkalemia: Secondary | ICD-10-CM

## 2013-01-20 DIAGNOSIS — M810 Age-related osteoporosis without current pathological fracture: Secondary | ICD-10-CM

## 2013-01-20 DIAGNOSIS — N280 Ischemia and infarction of kidney: Secondary | ICD-10-CM

## 2013-01-20 DIAGNOSIS — I5042 Chronic combined systolic (congestive) and diastolic (congestive) heart failure: Secondary | ICD-10-CM

## 2013-01-20 DIAGNOSIS — M25519 Pain in unspecified shoulder: Secondary | ICD-10-CM

## 2013-01-20 DIAGNOSIS — I252 Old myocardial infarction: Secondary | ICD-10-CM | POA: Insufficient documentation

## 2013-01-20 DIAGNOSIS — M545 Low back pain, unspecified: Secondary | ICD-10-CM

## 2013-01-20 DIAGNOSIS — R413 Other amnesia: Secondary | ICD-10-CM

## 2013-01-20 DIAGNOSIS — E876 Hypokalemia: Secondary | ICD-10-CM

## 2013-01-20 DIAGNOSIS — R040 Epistaxis: Secondary | ICD-10-CM

## 2013-01-20 DIAGNOSIS — E1149 Type 2 diabetes mellitus with other diabetic neurological complication: Secondary | ICD-10-CM

## 2013-01-20 DIAGNOSIS — I214 Non-ST elevation (NSTEMI) myocardial infarction: Secondary | ICD-10-CM

## 2013-01-20 DIAGNOSIS — Z7901 Long term (current) use of anticoagulants: Secondary | ICD-10-CM

## 2013-01-20 DIAGNOSIS — D649 Anemia, unspecified: Secondary | ICD-10-CM

## 2013-01-20 DIAGNOSIS — I059 Rheumatic mitral valve disease, unspecified: Secondary | ICD-10-CM | POA: Insufficient documentation

## 2013-01-20 DIAGNOSIS — I340 Nonrheumatic mitral (valve) insufficiency: Secondary | ICD-10-CM

## 2013-01-20 LAB — POCT I-STAT, CHEM 8
BUN: 8 mg/dL (ref 6–23)
Calcium, Ion: 1.09 mmol/L — ABNORMAL LOW (ref 1.13–1.30)
Chloride: 97 mEq/L (ref 96–112)
Creatinine, Ser: 0.7 mg/dL (ref 0.50–1.10)
Glucose, Bld: 326 mg/dL — ABNORMAL HIGH (ref 70–99)
HEMATOCRIT: 39 % (ref 36.0–46.0)
Hemoglobin: 13.3 g/dL (ref 12.0–15.0)
Potassium: 3.2 mEq/L — ABNORMAL LOW (ref 3.7–5.3)
SODIUM: 139 meq/L (ref 137–147)
TCO2: 25 mmol/L (ref 0–100)

## 2013-01-20 LAB — HEMOGLOBIN A1C
HEMOGLOBIN A1C: 9.7 % — AB (ref <5.7)
Mean Plasma Glucose: 232 mg/dL — ABNORMAL HIGH (ref <117)

## 2013-01-20 LAB — GLUCOSE, CAPILLARY
Glucose-Capillary: 266 mg/dL — ABNORMAL HIGH (ref 70–99)
Glucose-Capillary: 244 mg/dL — ABNORMAL HIGH (ref 70–99)
Glucose-Capillary: 108 mg/dL — ABNORMAL HIGH (ref 70–99)
Glucose-Capillary: 260 mg/dL — ABNORMAL HIGH (ref 70–99)

## 2013-01-20 LAB — CBC WITH DIFFERENTIAL/PLATELET
BASOS PCT: 1 % (ref 0–1)
Basophils Absolute: 0 10*3/uL (ref 0.0–0.1)
EOS ABS: 0.1 10*3/uL (ref 0.0–0.7)
EOS PCT: 1 % (ref 0–5)
HCT: 35.4 % — ABNORMAL LOW (ref 36.0–46.0)
Hemoglobin: 12.2 g/dL (ref 12.0–15.0)
LYMPHS ABS: 1.1 10*3/uL (ref 0.7–4.0)
Lymphocytes Relative: 17 % (ref 12–46)
MCH: 31.2 pg (ref 26.0–34.0)
MCHC: 34.5 g/dL (ref 30.0–36.0)
MCV: 90.5 fL (ref 78.0–100.0)
Monocytes Absolute: 0.6 10*3/uL (ref 0.1–1.0)
Monocytes Relative: 10 % (ref 3–12)
NEUTROS PCT: 71 % (ref 43–77)
Neutro Abs: 4.5 10*3/uL (ref 1.7–7.7)
PLATELETS: 216 10*3/uL (ref 150–400)
RBC: 3.91 MIL/uL (ref 3.87–5.11)
RDW: 13.7 % (ref 11.5–15.5)
WBC: 6.3 10*3/uL (ref 4.0–10.5)

## 2013-01-20 LAB — PRO B NATRIURETIC PEPTIDE: PRO B NATRI PEPTIDE: 8203 pg/mL — AB (ref 0–450)

## 2013-01-20 LAB — TSH: TSH: 4.813 u[IU]/mL — AB (ref 0.350–4.500)

## 2013-01-20 LAB — TROPONIN I

## 2013-01-20 MED ORDER — ACETAMINOPHEN 325 MG PO TABS
650.0000 mg | ORAL_TABLET | ORAL | Status: DC | PRN
  Administered 2013-01-21 (×2): 650 mg via ORAL
  Filled 2013-01-20 (×2): qty 2

## 2013-01-20 MED ORDER — INSULIN ASPART 100 UNIT/ML ~~LOC~~ SOLN
0.0000 [IU] | Freq: Three times a day (TID) | SUBCUTANEOUS | Status: DC
  Administered 2013-01-20: 5 [IU] via SUBCUTANEOUS
  Administered 2013-01-20: 3 [IU] via SUBCUTANEOUS
  Administered 2013-01-21: 08:00:00 1 [IU] via SUBCUTANEOUS
  Administered 2013-01-21: 3 [IU] via SUBCUTANEOUS
  Administered 2013-01-21: 12:00:00 5 [IU] via SUBCUTANEOUS
  Administered 2013-01-22: 3 [IU] via SUBCUTANEOUS
  Administered 2013-01-22: 2 [IU] via SUBCUTANEOUS

## 2013-01-20 MED ORDER — INSULIN ASPART 100 UNIT/ML ~~LOC~~ SOLN
0.0000 [IU] | Freq: Every day | SUBCUTANEOUS | Status: DC
  Administered 2013-01-20: 3 [IU] via SUBCUTANEOUS
  Administered 2013-01-21: 2 [IU] via SUBCUTANEOUS

## 2013-01-20 MED ORDER — SODIUM CHLORIDE 0.9 % IJ SOLN: 3.0000 mL | INTRAMUSCULAR | Status: DC | PRN

## 2013-01-20 MED ORDER — RIVAROXABAN 15 MG PO TABS
15.0000 mg | ORAL_TABLET | Freq: Every day | ORAL | Status: DC
  Administered 2013-01-20 – 2013-01-21 (×2): 15 mg via ORAL
  Filled 2013-01-20 (×4): qty 1

## 2013-01-20 MED ORDER — GLIPIZIDE 10 MG PO TABS
10.0000 mg | ORAL_TABLET | Freq: Two times a day (BID) | ORAL | Status: DC
  Administered 2013-01-20 – 2013-01-22 (×5): 10 mg via ORAL
  Filled 2013-01-20 (×9): qty 1

## 2013-01-20 MED ORDER — DILTIAZEM HCL 25 MG/5ML IV SOLN
10.0000 mg | Freq: Once | INTRAVENOUS | Status: AC
  Administered 2013-01-20: 10 mg via INTRAVENOUS
  Filled 2013-01-20: qty 5

## 2013-01-20 MED ORDER — SODIUM CHLORIDE 0.9 % IJ SOLN
3.0000 mL | Freq: Two times a day (BID) | INTRAMUSCULAR | Status: DC
  Administered 2013-01-20 – 2013-01-22 (×5): 3 mL via INTRAVENOUS

## 2013-01-20 MED ORDER — GABAPENTIN 100 MG PO CAPS
100.0000 mg | ORAL_CAPSULE | Freq: Three times a day (TID) | ORAL | Status: DC
  Administered 2013-01-20 – 2013-01-22 (×7): 100 mg via ORAL
  Filled 2013-01-20 (×12): qty 1

## 2013-01-20 MED ORDER — FUROSEMIDE 10 MG/ML IJ SOLN
40.0000 mg | INTRAMUSCULAR | Status: AC
  Administered 2013-01-20: 40 mg via INTRAVENOUS
  Filled 2013-01-20: qty 4

## 2013-01-20 MED ORDER — ATORVASTATIN CALCIUM 40 MG PO TABS
40.0000 mg | ORAL_TABLET | Freq: Every day | ORAL | Status: DC
  Administered 2013-01-20 – 2013-01-21 (×2): 40 mg via ORAL
  Filled 2013-01-20 (×4): qty 1

## 2013-01-20 MED ORDER — VITAMIN B-12 1000 MCG PO TABS
1000.0000 ug | ORAL_TABLET | Freq: Every morning | ORAL | Status: DC
  Administered 2013-01-20 – 2013-01-22 (×3): 1000 ug via ORAL
  Filled 2013-01-20 (×4): qty 1

## 2013-01-20 MED ORDER — SODIUM CHLORIDE 0.9 % IV SOLN: 250.0000 mL | INTRAVENOUS | Status: DC | PRN

## 2013-01-20 MED ORDER — ATENOLOL 50 MG PO TABS
50.0000 mg | ORAL_TABLET | Freq: Two times a day (BID) | ORAL | Status: DC
  Administered 2013-01-20 – 2013-01-22 (×5): 50 mg via ORAL
  Filled 2013-01-20 (×8): qty 1

## 2013-01-20 MED ORDER — ONDANSETRON HCL 4 MG/2ML IJ SOLN: 4.0000 mg | Freq: Three times a day (TID) | INTRAMUSCULAR | Status: AC | PRN

## 2013-01-20 MED ORDER — SODIUM CHLORIDE 0.9 % IV SOLN
Freq: Once | INTRAVENOUS | Status: AC
  Administered 2013-01-20: 02:00:00 via INTRAVENOUS

## 2013-01-20 MED ORDER — DILTIAZEM HCL 30 MG PO TABS
30.0000 mg | ORAL_TABLET | Freq: Four times a day (QID) | ORAL | Status: DC
  Administered 2013-01-20 – 2013-01-21 (×5): 30 mg via ORAL
  Filled 2013-01-20 (×10): qty 1

## 2013-01-20 MED ORDER — POTASSIUM CHLORIDE CRYS ER 20 MEQ PO TBCR
40.0000 meq | EXTENDED_RELEASE_TABLET | Freq: Once | ORAL | Status: AC
  Administered 2013-01-20: 40 meq via ORAL
  Filled 2013-01-20: qty 2

## 2013-01-20 MED ORDER — ONDANSETRON HCL 4 MG/2ML IJ SOLN: 4.0000 mg | Freq: Four times a day (QID) | INTRAMUSCULAR | Status: DC | PRN

## 2013-01-20 NOTE — H&P (Addendum)
[facility] History and Physical  Michelle Herrera WVP:[id number] DOB: 09/11/1930 DOA: 01/20/2013  Referring physician:  EDP PCP: Michelle Space, MD  Specialists:   Chief Complaint:  Palpitations and WorseningSOB  HPI: Michelle Herrera is a 80 y.o. female with a history of Chronic Atrial Fibrillation Combined systolic and Diastolic CHF with last 2D ECHO on 06/2102 and DM2 who presents to the ED with complaints of worsening SOB over the past 4 days with Orthopnea and DOE.  She denies chest pain.  She does report havinn palpitations for the past 2 weeks, and feels at times as if her heart was pounding out of her chest.  This occurs especially at night when she lays down.   In the Ed she was found to have a BNP = 8203.0 and a chest X-Ray revealing Mild Bibasilar Opacities.  She was administered IV Cardiazem X 1 dose and given IV Lasix X 1 dose and referred for medical admission.     Review of Systems: The patient denies anorexia, fever, chills, headaches, weight loss, vision loss, diplopia, dizziness, decreased hearing, rhinitis, hoarseness, chest pain, syncope, dyspnea on exertion, peripheral edema, balance deficits, cough, hemoptysis, abdominal pain, nausea, vomiting, diarrhea, constipation, hematemesis, melena, hematochezia, severe indigestion/heartburn, dysuria, hematuria, incontinence, muscle weakness, suspicious skin lesions, transient blindness, difficulty walking, depression, unusual weight change, abnormal bleeding, enlarged lymph nodes, angioedema, and breast masses.    Past Medical History  Diagnosis Date  . HTN (hypertension)   . Pure hypercholesterolemia   . Type II or unspecified type diabetes mellitus without mention of complication, not stated as uncontrolled   . Anxiety state, unspecified   . Diaphragmatic hernia without mention of obstruction or gangrene   . Benign neoplasm of colon   . Mixed incontinence urge and stress (female)(female)   . Osteoarthrosis, unspecified  whether generalized or localized, unspecified site   . Lumbago   . Osteoporosis, unspecified   . Memory loss   . Renal infarction     01/2011 in setting of low INR  . History of stroke   . AF (atrial fibrillation)     a. Chronic Xarelto  . CAD (coronary artery disease)     a. 05/2012 NSTEMI/Cath/PCI: LM nl, LAD min irregs, LCX min irregs, RI 40-50 ost, OM1 nl, RCA dom, 28m (4.0x12 Vision BMS), PD/PL nl, EF 55%, at least mod MR.  . Mitral regurgitation     a. mild to mod by echo 01/2011;  b. felt to be at least mod by LV gram 05/2012.  [name] Chronic combined systolic and diastolic CHF (congestive heart failure)     a. 01/2011 Echo: EF 40-45%, diff HK, diast dysfxn, mild to mod MR, mod to sev dil LA/RV/RA, mod to sev reduced RV fxn, mod TR, PASP 61mmHg.;  b.  Echo 06/15/12:  EF 55%, mild MR, severe BAE, mod TR, PASP 35    Past Surgical History  Procedure Laterality Date  . Cholecystectomy    . Rt.leg surgery  11/2010  . Cataract surgery/both eyes      01/2012 left eye---02/2012 right eye    Prior to Admission medications   Medication Sig Start Date End Date Taking? Authorizing Provider  acetaminophen (TYLENOL) 500 MG tablet Take 500 mg by mouth every 6 (six) hours as needed.   Yes Historical Provider, MD  atenolol (TENORMIN) 50 MG tablet Take 50 mg by mouth 2 (two) times daily.   Yes Historical Provider, MD  atorvastatin (LIPITOR) 40 MG tablet Take 1 tablet (40 mg  total) by mouth daily at 6 PM. 05/29/12  Yes Rogelia Mire, NP  gabapentin (NEURONTIN) 100 MG capsule Take 1 capsule (100 mg total) by mouth 3 (three) times daily. 08/01/12  Yes Michelle Space, MD  glipiZIDE (GLUCOTROL) 10 MG tablet Take 2 tablets by mouth two times daily 10/22/12  Yes Michelle Space, MD  metFORMIN (GLUCOPHAGE) 500 MG tablet Take 1,000 mg by mouth 2 (two) times daily with a meal.    Yes Historical Provider, MD  Rivaroxaban (XARELTO) 15 MG TABS tablet Take 15 mg by mouth at bedtime.   Yes Historical Provider, MD   vitamin B-12 (CYANOCOBALAMIN) 1000 MCG tablet Take 1,000 mcg by mouth every morning.    Yes Historical Provider, MD    Allergies  Allergen Reactions  . Peanut-Containing Drug Products Anaphylaxis and Swelling  . Azithromycin     REACTION: pt states INTOL to ZPak  . Flomax [Tamsulosin Hcl] Swelling    swelling  . Pioglitazone     edema  . Pregabalin     REACTION: pt states INTOL to Lyrica  . Sulfonamide Derivatives Swelling    REACTION: SWELLING    Social History:  reports that she has never smoked. She has never used smokeless tobacco. She reports that she does not drink alcohol or use illicit drugs.     Family History  Problem Relation Age of Onset  . Stomach cancer Father   . Pneumonia Mother   . CAD Brother       Physical Exam:  GEN:  Pleasant  Elderly thin  80 y.o. Caucasian female  examined  and in no acute distress; cooperative with exam Filed Vitals:   01/20/13 0345 01/20/13 0357 01/20/13 0400 01/20/13 0444  BP: 171/94 131/106 168/96 167/94  Pulse: 90 82 86 83  Temp:    97.1 F (36.2 C)  TempSrc:    Oral  Resp: 17 30 26 20   Height:    5\' 5"  (1.651 m)  Weight:    60.374 kg (133 lb 1.6 oz)  SpO2: 100% 95% 99% 100%   Blood pressure 167/94, pulse 83, temperature 97.1 F (36.2 C), temperature source Oral, resp. rate 20, height 5\' 5"  (1.651 m), weight 60.374 kg (133 lb 1.6 oz), SpO2 100.00%. PSYCH: She is alert and oriented x4; does not appear anxious does not appear depressed; affect is normal HEENT: Normocephalic and Atraumatic, Mucous membranes pink; PERRLA; EOM intact; Fundi:  Benign;  No scleral icterus, Nares: Patent, Oropharynx: Clear, Edentulous or Fair Dentition, Neck:  FROM, no cervical lymphadenopathy nor thyromegaly or carotid bruit; no JVD; Breasts:: Not examined CHEST WALL: No tenderness CHEST: Normal respiration, clear to auscultation bilaterally HEART:  Irregular rate and rhythm; no murmurs rubs or gallops BACK: No kyphosis or scoliosis; no CVA  tenderness ABDOMEN: Positive Bowel Sounds, soft non-tender; no masses, no organomegaly Rectal Exam: Not done EXTREMITIES: No cyanosis, clubbing or edema; no ulcerations. Genitalia: not examined PULSES: 2+ and symmetric SKIN: Normal hydration no rash or ulceration CNS: Cranial nerves 2-12 grossly intact no focal neurologic deficit    Labs on Admission:  Basic Metabolic Panel:  Recent Labs Lab 01/20/13 0144  NA 139  K 3.2*  CL 97  GLUCOSE 326*  BUN 8  CREATININE 0.70   Liver Function Tests: No results found for this basename: AST, ALT, ALKPHOS, BILITOT, PROT, ALBUMIN,  in the last 168 hours No results found for this basename: LIPASE, AMYLASE,  in the last 168 hours No results found for this basename:  AMMONIA,  in the last 168 hours CBC:  Recent Labs Lab 01/20/13 0124 01/20/13 0144  WBC 6.3  --   NEUTROABS 4.5  --   HGB 12.2 13.3  HCT 35.4* 39.0  MCV 90.5  --   PLT 216  --    Cardiac Enzymes:  Recent Labs Lab 01/20/13 0124  TROPONINI <0.30    BNP (last 3 results)  Recent Labs  05/25/12 0625 01/20/13 0130  PROBNP 1616.0* 8203.0*   CBG: No results found for this basename: GLUCAP,  in the last 168 hours  Radiological Exams on Admission: Dg Chest Port 1 View  01/20/2013   CLINICAL DATA:  Chest pain, shortness of breath.  EXAM: PORTABLE CHEST - 1 VIEW  COMPARISON:  May 25, 2012.  FINDINGS: Stable cardiomegaly. No pneumothorax is noted. Mild bilateral basilar opacities are noted most consistent with subsegmental atelectasis. Bony thorax appears intact.  IMPRESSION: Mild bilateral basilar opacities most consistent with subsegmental atelectasis. Stable cardiomegaly.   Electronically Signed   By: Sabino Dick M.D.   On: 01/20/2013 02:22     EKG: Independently reviewed.  Atrial fibrillation rate 110.     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   Acute CHF/Combined systolic and diastolic CHF-   CHF Protocol ordered and Diurese with IV Lasix.   Continue Beta Blocker Rx, and 2D ECHO was done 06/2012.    2.   Atrial fibrillation -  Continue Xarelto Rx, and Atenolol Rx.   Monitor on Telemetry.    3.   HTN-  Continue Atenolol Rx, and Monitor BPs IV Hydralazine PRN.    4.  DM2-  Continue Glipizide Rx, and SSI coverage PRN, check Hb A1c.     5.  Hyperlipidemia- continue Atorvastatin Rx.    6.  Hypokalemia- Replete K+.       Code Status:   FULL CODE Family Communication:    Family at Bedside Disposition Plan:       Observation Status  Time spent:  [facility] Pager [phone]  If 7PM-7AM, please contact night-coverage [email or web address] Password [email or web address] 01/20/2013, 5:27 AM

## 2013-01-20 NOTE — ED Notes (Signed)
Patient initally reports c/o chest pain, now states she does not have chest pain, but has "her heart beating away" and SOB. Patient has hx of Afib, and is currently in Afib. Patient takes Xarelto, has been on this medication for "a couple years".

## 2013-01-20 NOTE — ED Notes (Signed)
Patient repeatedly reports she has no heat source at home, other than a wood stove.

## 2013-01-20 NOTE — ED Notes (Signed)
Dr. Jenkins at bedside. 

## 2013-01-20 NOTE — ED Provider Notes (Signed)
CSN: [id number]     Arrival date & time 01/20/13  0105 History   First MD Initiated Contact with Patient 01/20/13 0107     Chief Complaint  Patient presents with  . Palpitations    onset 2 weeks ago, has not been seen   (Consider location/radiation/quality/duration/timing/severity/associated sxs/prior Treatment) HPI Comments: 80 year old female, history of chronic atrial fibrillation on chronic anticoagulation who also had a stent in her right coronary placed in May of 2014. She presents to the hospital with increasing episodes of palpitations and shortness of breath. She feels as though this is gradually worsening, nothing seems to make it better or worse, she denies fevers, chills, coughing or leg swelling. She has been taking her medications as prescribed including her anticoagulant and her rate control atenolol. Her last echocardiogram done in June showed an ejection fraction of 55% with no significant signs of heart failure.  Patient is a 80 y.o. female presenting with palpitations. The history is provided by the patient.  Palpitations   Past Medical History  Diagnosis Date  . HTN (hypertension)   . Pure hypercholesterolemia   . Type II or unspecified type diabetes mellitus without mention of complication, not stated as uncontrolled   . Anxiety state, unspecified   . Diaphragmatic hernia without mention of obstruction or gangrene   . Benign neoplasm of colon   . Mixed incontinence urge and stress (female)(female)   . Osteoarthrosis, unspecified whether generalized or localized, unspecified site   . Lumbago   . Osteoporosis, unspecified   . Memory loss   . Renal infarction     01/2011 in setting of low INR  . History of stroke   . AF (atrial fibrillation)     a. Chronic Xarelto  . CAD (coronary artery disease)     a. 05/2012 NSTEMI/Cath/PCI: LM nl, LAD min irregs, LCX min irregs, RI 40-50 ost, OM1 nl, RCA dom, 6m (4.0x12 Vision BMS), PD/PL nl, EF 55%, at least mod MR.  . Mitral  regurgitation     a. mild to mod by echo 01/2011;  b. felt to be at least mod by LV gram 05/2012.  [name] Chronic combined systolic and diastolic CHF (congestive heart failure)     a. 01/2011 Echo: EF 40-45%, diff HK, diast dysfxn, mild to mod MR, mod to sev dil LA/RV/RA, mod to sev reduced RV fxn, mod TR, PASP 53mmHg.;  b.  Echo 06/15/12:  EF 55%, mild MR, severe BAE, mod TR, PASP 35   Past Surgical History  Procedure Laterality Date  . Cholecystectomy    . Rt.leg surgery  11/2010  . Cataract surgery/both eyes      01/2012 left eye---02/2012 right eye   Family History  Problem Relation Age of Onset  . Stomach cancer Father   . Pneumonia Mother   . CAD Brother    History  Substance Use Topics  . Smoking status: Never Smoker   . Smokeless tobacco: Never Used  . Alcohol Use: No   OB History   Grav Para Term Preterm Abortions TAB SAB Ect Mult Living                 Review of Systems  Cardiovascular: Positive for palpitations.  All other systems reviewed and are negative.    Allergies  Peanut-containing drug products; Azithromycin; Flomax; Pioglitazone; Pregabalin; and Sulfonamide derivatives  Home Medications   No current outpatient prescriptions on file. BP 167/94  Pulse 83  Temp(Src) 97.1 F (36.2 C) (Oral)  Resp 20  Ht 5\' 5"  (1.651 m)  Wt 133 lb 1.6 oz (60.374 kg)  BMI 22.15 kg/m2  SpO2 100% Physical Exam  Nursing note and vitals reviewed. Constitutional: She appears well-developed and well-nourished. No distress.  HENT:  Head: Normocephalic and atraumatic.  Mouth/Throat: Oropharynx is clear and moist. No oropharyngeal exudate.  Eyes: Conjunctivae and EOM are normal. Pupils are equal, round, and reactive to light. Right eye exhibits no discharge. Left eye exhibits no discharge. No scleral icterus.  Neck: Normal range of motion. Neck supple. No JVD present. No thyromegaly present.  Cardiovascular: Normal heart sounds and intact distal pulses.  Exam reveals no gallop and  no friction rub.   No murmur heard. Tachycardia at 110 beats per minute, this is variable, she is in atrial fibrillation but has strong pulses at the radial artery. No JVD  Pulmonary/Chest: Effort normal and breath sounds normal. No respiratory distress. She has no wheezes. She has no rales.  Abdominal: Soft. Bowel sounds are normal. She exhibits no distension and no mass. There is no tenderness.  Musculoskeletal: Normal range of motion. She exhibits no edema and no tenderness.  Lymphadenopathy:    She has no cervical adenopathy.  Neurological: She is alert. Coordination normal.  Skin: Skin is warm and dry. No rash noted. No erythema.  Psychiatric: She has a normal mood and affect. Her behavior is normal.    ED Course  Procedures (including critical care time) Labs Review Labs Reviewed  CBC WITH DIFFERENTIAL - Abnormal; Notable for the following:    HCT 35.4 (*)    All other components within normal limits  PRO B NATRIURETIC PEPTIDE - Abnormal; Notable for the following:    Pro B Natriuretic peptide (BNP) 8203.0 (*)    All other components within normal limits  POCT I-STAT, CHEM 8 - Abnormal; Notable for the following:    Potassium 3.2 (*)    Glucose, Bld 326 (*)    Calcium, Ion 1.09 (*)    All other components within normal limits  TROPONIN I  HEMOGLOBIN A1C  TSH   Imaging Review Dg Chest Port 1 View  01/20/2013   CLINICAL DATA:  Chest pain, shortness of breath.  EXAM: PORTABLE CHEST - 1 VIEW  COMPARISON:  May 25, 2012.  FINDINGS: Stable cardiomegaly. No pneumothorax is noted. Mild bilateral basilar opacities are noted most consistent with subsegmental atelectasis. Bony thorax appears intact.  IMPRESSION: Mild bilateral basilar opacities most consistent with subsegmental atelectasis. Stable cardiomegaly.   Electronically Signed   By: Sabino Dick M.D.   On: 01/20/2013 02:22    EKG Interpretation    Date/Time:  Sunday January 20 2013 01:14:26 EST Ventricular Rate:  110 PR  Interval:    QRS Duration: 93 QT Interval:  351 QTC Calculation: 475 R Axis:   105 Text Interpretation:  Atrial flutter Ventricular premature complex Probable lateral infarct, age indeterminate diffuse ST and T wave abnormalities Abnormal ekg Since last tracing Rate faster Confirmed by Aiyonna Lucado  MD, Omero Kowal ([id number]) on 01/20/2013 1:44:39 AM            MDM   1. Atrial fibrillation   2. CHF (congestive heart failure)   3. Hyperkalemia   4. Hyperglycemia    The patient's EKG confirms that she has an ongoing atrial fibrillation, no signs of ST elevation or depression however she does have ST and T wave abnormalities are nonspecific diffusely. Chest x-ray pending, labs pending, placed on cardiac monitor with supplemental oxygen. Heart rate ranges between 90 and  115 during my exam.  Heart rate remained elevated, Cardizem given for tachycardia, no significant findings to suggest decompensated congestive heart failure but does have elevated BNP, hypertension and tachycardia that we'll need admission for observation and treatment of symptoms in heart rate with diuresis as well. Discussed with hospitalist who agrees.       Johnna Acosta, MD 01/20/13 [phone]

## 2013-01-20 NOTE — Progress Notes (Signed)
Patient seen and evaluated earlier this AM by my associate. Please refer to her H and P for details regarding assessment and plan.   Will plan on reassessing next am.  Velvet Bathe

## 2013-01-20 NOTE — ED Notes (Signed)
Patient states symptoms occur at night time, she states she sleeps during the day time. C/O SOB, patient has dyspnea with exertion. Dr Sabra Heck at bedside.

## 2013-01-20 NOTE — ED Notes (Signed)
Susie in lab HgbA1C can be added to blood in lab, label sent to lab.

## 2013-01-21 DIAGNOSIS — I214 Non-ST elevation (NSTEMI) myocardial infarction: Secondary | ICD-10-CM

## 2013-01-21 DIAGNOSIS — E875 Hyperkalemia: Secondary | ICD-10-CM

## 2013-01-21 LAB — GLUCOSE, CAPILLARY
GLUCOSE-CAPILLARY: 229 mg/dL — AB (ref 70–99)
Glucose-Capillary: 149 mg/dL — ABNORMAL HIGH (ref 70–99)
Glucose-Capillary: 265 mg/dL — ABNORMAL HIGH (ref 70–99)
Glucose-Capillary: 239 mg/dL — ABNORMAL HIGH (ref 70–99)

## 2013-01-21 LAB — BASIC METABOLIC PANEL
BUN: 11 mg/dL (ref 6–23)
CHLORIDE: 97 meq/L (ref 96–112)
CO2: 29 meq/L (ref 19–32)
Calcium: 8.7 mg/dL (ref 8.4–10.5)
Creatinine, Ser: 0.61 mg/dL (ref 0.50–1.10)
GFR calc Af Amer: >90 mL/min (ref >90)
GFR calc non Af Amer: 83 mL/min — ABNORMAL LOW (ref >90)
Glucose, Bld: 124 mg/dL — ABNORMAL HIGH (ref 70–99)
POTASSIUM: 3.5 meq/L — AB (ref 3.7–5.3)
Sodium: 139 mEq/L (ref 137–147)

## 2013-01-21 MED ORDER — DILTIAZEM HCL ER COATED BEADS 120 MG PO CP24
120.0000 mg | ORAL_CAPSULE | Freq: Every day | ORAL | Status: DC
  Administered 2013-01-21 – 2013-01-22 (×2): 120 mg via ORAL
  Filled 2013-01-21 (×2): qty 1

## 2013-01-21 MED ORDER — POTASSIUM CHLORIDE CRYS ER 20 MEQ PO TBCR
40.0000 meq | EXTENDED_RELEASE_TABLET | Freq: Once | ORAL | Status: AC
  Administered 2013-01-21: 40 meq via ORAL
  Filled 2013-01-21: qty 2

## 2013-01-21 NOTE — Progress Notes (Signed)
[facility] PROGRESS NOTE  Michelle Herrera KGY:[id number] DOB: [birth year] DOA: 01/20/2013 PCP: Noralee Space, MD  Assessment/Plan:  Principal Problem:   Acute CHF - resolved, patient breathing comfortably on room air. - Continue B blocker. Since I will be adjusting patient cardizem I would like to wait and see what her blood pressure will be before adding ACEI - Most likely diastolic given normal EF reported in 06/15/12 - currently compensated.  Active Problems:   HYPERCHOLESTEROLEMIA - stable continue statin    HYPERTENSION - B blocker and cardizem on board    Atrial fibrillation - Will change from short acting cardizem to long acting today and assess response - continue B blocker and xarelto    Type II or unspecified type diabetes mellitus with neurological manifestations, uncontrolled(250.62) - Currently on Glipizide and SSI - Carb modified diet.    Hypokalemia - will replace orally today. Last value 3.5   Code Status: full Family Communication: Discussed with patient  Disposition Plan: Pending controlled heart rate on long acting cardizem   Consultants:  None  Procedures:  none  Antibiotics:  None  HPI/Subjective: No new complaints. No acute issues reported overnight.  States that her breathing is improved.  Objective: Filed Vitals:   01/21/13 0500  BP: 144/72  Pulse: 82  Temp: 97.7 F (36.5 C)  Resp: 18    Intake/Output Summary (Last 24 hours) at 01/21/13 1015 Last data filed at 01/21/13 0600  Gross per 24 hour  Intake    240 ml  Output    850 ml  Net   -610 ml   Filed Weights   01/20/13 0122 01/20/13 0444 01/21/13 0500  Weight: 58.06 kg (128 lb) 60.374 kg (133 lb 1.6 oz) 57.561 kg (126 lb 14.4 oz)    Exam:   General:  Pt in NAD, Alert and awake  Cardiovascular: irregularly irregular, no murmurs  Respiratory: CTA BL, no wheezes  Abdomen: soft, NT, ND  Musculoskeletal: no cyanosis or clubbing   Data Reviewed: Basic  Metabolic Panel:  Recent Labs Lab 01/20/13 0144 01/21/13 0455  NA 139 139  K 3.2* 3.5*  CL 97 97  CO2  --  29  GLUCOSE 326* 124*  BUN 8 11  CREATININE 0.70 0.61  CALCIUM  --  8.7   Liver Function Tests: No results found for this basename: AST, ALT, ALKPHOS, BILITOT, PROT, ALBUMIN,  in the last 168 hours No results found for this basename: LIPASE, AMYLASE,  in the last 168 hours No results found for this basename: AMMONIA,  in the last 168 hours CBC:  Recent Labs Lab 01/20/13 0124 01/20/13 0144  WBC 6.3  --   NEUTROABS 4.5  --   HGB 12.2 13.3  HCT 35.4* 39.0  MCV 90.5  --   PLT 216  --    Cardiac Enzymes:  Recent Labs Lab 01/20/13 0124  TROPONINI <0.30   BNP (last 3 results)  Recent Labs  05/25/12 0625 01/20/13 0130  PROBNP 1616.0* 8203.0*   CBG:  Recent Labs Lab 01/20/13 0750 01/20/13 1232 01/20/13 1637 01/20/13 2135 01/21/13 0743  GLUCAP 266* 244* 108* 260* 149*    No results found for this or any previous visit (from the past 240 hour(s)).   Studies: Dg Chest Port 1 View  01/20/2013   CLINICAL DATA:  Chest pain, shortness of breath.  EXAM: PORTABLE CHEST - 1 VIEW  COMPARISON:  May 25, 2012.  FINDINGS: Stable cardiomegaly. No pneumothorax is noted. Mild bilateral basilar opacities are  noted most consistent with subsegmental atelectasis. Bony thorax appears intact.  IMPRESSION: Mild bilateral basilar opacities most consistent with subsegmental atelectasis. Stable cardiomegaly.   Electronically Signed   By: Sabino Dick M.D.   On: 01/20/2013 02:22    Scheduled Meds: . atenolol  50 mg Oral BID  . atorvastatin  40 mg Oral q1800  . diltiazem  120 mg Oral Daily  . gabapentin  100 mg Oral TID  . glipiZIDE  10 mg Oral BID AC  . insulin aspart  0-5 Units Subcutaneous QHS  . insulin aspart  0-9 Units Subcutaneous TID WC  . Rivaroxaban  15 mg Oral QHS  . sodium chloride  3 mL Intravenous Q12H  . vitamin B-12  1,000 mcg Oral q morning - 10a    Continuous Infusions:    Time spent: > 35 minutes    Velvet Bathe  [facility] Pager [phone] If 7PM-7AM, please contact night-coverage at [email or web address], password [facility] 01/21/2013, 10:15 AM  LOS: 1 day

## 2013-01-22 ENCOUNTER — Ambulatory Visit: Payer: Medicare Other | Admitting: Cardiology

## 2013-01-22 ENCOUNTER — Ambulatory Visit: Payer: Medicare Other | Admitting: Physician Assistant

## 2013-01-22 DIAGNOSIS — R040 Epistaxis: Secondary | ICD-10-CM

## 2013-01-22 DIAGNOSIS — F411 Generalized anxiety disorder: Secondary | ICD-10-CM

## 2013-01-22 DIAGNOSIS — I10 Essential (primary) hypertension: Secondary | ICD-10-CM

## 2013-01-22 LAB — GLUCOSE, CAPILLARY
Glucose-Capillary: 187 mg/dL — ABNORMAL HIGH (ref 70–99)
Glucose-Capillary: 206 mg/dL — ABNORMAL HIGH (ref 70–99)

## 2013-01-22 LAB — BASIC METABOLIC PANEL
BUN: 24 mg/dL — ABNORMAL HIGH (ref 6–23)
CHLORIDE: 97 meq/L (ref 96–112)
CO2: 28 meq/L (ref 19–32)
Calcium: 8.6 mg/dL (ref 8.4–10.5)
Creatinine, Ser: 0.78 mg/dL (ref 0.50–1.10)
GFR calc Af Amer: 89 mL/min — ABNORMAL LOW (ref >90)
GFR calc non Af Amer: 77 mL/min — ABNORMAL LOW (ref >90)
Glucose, Bld: 188 mg/dL — ABNORMAL HIGH (ref 70–99)
POTASSIUM: 3.7 meq/L (ref 3.7–5.3)
SODIUM: 139 meq/L (ref 137–147)

## 2013-01-22 MED ORDER — DILTIAZEM HCL ER COATED BEADS 120 MG PO CP24: 120.0000 mg | ORAL_CAPSULE | Freq: Every day | ORAL | Status: DC

## 2013-01-22 NOTE — Discharge Summary (Signed)
Physician Discharge Summary  Michelle Herrera PQZ:[id number] DOB: [birth year] DOA: 01/20/2013  PCP: Noralee Space, MD  Admit date: 01/20/2013 Discharge date: 01/22/2013  Time spent: > 35 minutes  Recommendations for Outpatient Follow-up:  1. Please be sure to monitor blood pressures and adjust medications pending blood pressures 2. Patient had potassium replacement is normal on discharge at 3.7 m may want to consider reassessing on post hospital followup  Discharge Diagnoses:  Principal Problem:   Acute CHF Active Problems:   HYPERCHOLESTEROLEMIA   HYPERTENSION   Atrial fibrillation   Type II or unspecified type diabetes mellitus with neurological manifestations, uncontrolled(250.62)   Chronic combined systolic and diastolic CHF (congestive heart failure)   Hypokalemia   Hyperglycemia   Discharge Condition: Stable  Diet recommendation: Low sodium heart healthy  Filed Weights   01/20/13 0444 01/21/13 0500 01/22/13 0446  Weight: 60.374 kg (133 lb 1.6 oz) 57.561 kg (126 lb 14.4 oz) 58.196 kg (128 lb 4.8 oz)    History of present illness:  Patient is an 80 year old female with history of chronic atrial fibrillation and DM type II who presented to the ED complaining of palpitations and worsening shortness of breath. During initial evaluation in the ED patient was found to have A. fib with RVR and Cardizem was initiated by ED physician.  Hospital Course:   Principal Problem:   Acute CHF  - resolved, patient breathing comfortably on room air.  - Suspect it may have been secondary to A. fib with RVR which is currently controlled with beta blocker and Cardizem. We'll continue beta blocker and Cardizem on discharge. - Patient initially had 1 dose of Lasix IV 40 mg. She has been net negative > 1.5 L - Most likely diastolic given normal EF reported in 06/15/12  - currently compensated. We'll defer further Lasix recommendations to primary care physician. Currently patient compensated and  BUN/creatinine ratio elevated as such we'll not discharge on Lasix. Patient breathing comfortably on room air. With no tachypnea reported within the last 24 hours.  Active Problems:   HYPERCHOLESTEROLEMIA  - stable continue home dose statin   HYPERTENSION  - B blocker and cardizem on board   Atrial fibrillation  - continue B blocker and Cardizem -xarelto   Type II or unspecified type diabetes mellitus with neurological manifestations, uncontrolled(250.62)  - Continue home regimen of oral hypoglycemic agents metformin and glipizide - Carb modified diet.   Hypokalemia  - will replace orally today. Last value 3.5   Procedures:  None  Consultations:  None  Discharge Exam: Filed Vitals:   01/22/13 0446  BP: 123/77  Pulse: 82  Temp: 97.6 F (36.4 C)  Resp: 18    General: Patient in no acute distress, alert and awake Cardiovascular: Irregularly irregular, no murmurs or rubs Respiratory: Patient breathing comfortably on room air, breath sounds bilaterally, no wheezes  Discharge Instructions  Discharge Orders   Future Appointments Provider Department Dept Phone   03/21/2013 11:30 AM Noralee Space, MD [facility] [phone]   04/24/2013 10:00 AM [place], MD [facility] [phone]   Future Orders Complete By Expires   Call MD for:  difficulty breathing, headache or visual disturbances  As directed    Call MD for:  redness, tenderness, or signs of infection (pain, swelling, redness, odor or green/yellow discharge around incision site)  As directed    Call MD for:  temperature >100.4  As directed    Diet - low sodium heart healthy  As directed  Discharge instructions  As directed    Scheduling Instructions:     Please be sure to follow up with your primary care physician in 1-2 weeks or sooner should any new concerns.   Increase activity slowly  As directed        Medication List         acetaminophen 500 MG tablet  Commonly known  as:  TYLENOL  Take 500 mg by mouth every 6 (six) hours as needed.     atenolol 50 MG tablet  Commonly known as:  TENORMIN  Take 50 mg by mouth 2 (two) times daily.     atorvastatin 40 MG tablet  Commonly known as:  LIPITOR  Take 1 tablet (40 mg total) by mouth daily at 6 PM.     diltiazem 120 MG 24 hr capsule  Commonly known as:  CARDIZEM CD  Take 1 capsule (120 mg total) by mouth daily.     gabapentin 100 MG capsule  Commonly known as:  NEURONTIN  Take 1 capsule (100 mg total) by mouth 3 (three) times daily.     glipiZIDE 10 MG tablet  Commonly known as:  GLUCOTROL  Take 2 tablets by mouth two times daily     metFORMIN 500 MG tablet  Commonly known as:  GLUCOPHAGE  Take 1,000 mg by mouth 2 (two) times daily with a meal.     Rivaroxaban 15 MG Tabs tablet  Commonly known as:  XARELTO  Take 15 mg by mouth at bedtime.     vitamin B-12 1000 MCG tablet  Commonly known as:  CYANOCOBALAMIN  Take 1,000 mcg by mouth every morning.       Allergies  Allergen Reactions  . Peanut-Containing Drug Products Anaphylaxis and Swelling  . Azithromycin     REACTION: pt states INTOL to ZPak  . Flomax [Tamsulosin Hcl] Swelling    swelling  . Pioglitazone     edema  . Pregabalin     REACTION: pt states INTOL to Lyrica  . Sulfonamide Derivatives Swelling    REACTION: SWELLING      The results of significant diagnostics from this hospitalization (including imaging, microbiology, ancillary and laboratory) are listed below for reference.    Significant Diagnostic Studies: Dg Chest Port 1 View  01/20/2013   CLINICAL DATA:  Chest pain, shortness of breath.  EXAM: PORTABLE CHEST - 1 VIEW  COMPARISON:  May 25, 2012.  FINDINGS: Stable cardiomegaly. No pneumothorax is noted. Mild bilateral basilar opacities are noted most consistent with subsegmental atelectasis. Bony thorax appears intact.  IMPRESSION: Mild bilateral basilar opacities most consistent with subsegmental atelectasis. Stable  cardiomegaly.   Electronically Signed   By: Sabino Dick M.D.   On: 01/20/2013 02:22    Microbiology: No results found for this or any previous visit (from the past 240 hour(s)).   Labs: Basic Metabolic Panel:  Recent Labs Lab 01/20/13 0144 01/21/13 0455 01/22/13 0420  NA 139 139 139  K 3.2* 3.5* 3.7  CL 97 97 97  CO2  --  29 28  GLUCOSE 326* 124* 188*  BUN 8 11 24*  CREATININE 0.70 0.61 0.78  CALCIUM  --  8.7 8.6   Liver Function Tests: No results found for this basename: AST, ALT, ALKPHOS, BILITOT, PROT, ALBUMIN,  in the last 168 hours No results found for this basename: LIPASE, AMYLASE,  in the last 168 hours No results found for this basename: AMMONIA,  in the last 168 hours CBC:  Recent Labs  Lab 01/20/13 0124 01/20/13 0144  WBC 6.3  --   NEUTROABS 4.5  --   HGB 12.2 13.3  HCT 35.4* 39.0  MCV 90.5  --   PLT 216  --    Cardiac Enzymes:  Recent Labs Lab 01/20/13 0124  TROPONINI <0.30   BNP: BNP (last 3 results)  Recent Labs  05/25/12 0625 01/20/13 0130  PROBNP 1616.0* 8203.0*   CBG:  Recent Labs Lab 01/21/13 1142 01/21/13 1731 01/21/13 2209 01/22/13 0737 01/22/13 1128  GLUCAP 265* 239* 229* 187* 206*       Signed:  Velvet Bathe  [facility] 01/22/2013, 12:23 PM

## 2013-01-24 ENCOUNTER — Ambulatory Visit: Payer: Medicare Other | Admitting: Pulmonary Disease

## 2013-01-29 ENCOUNTER — Encounter: Payer: Self-pay | Admitting: Physician Assistant

## 2013-02-04 ENCOUNTER — Ambulatory Visit (INDEPENDENT_AMBULATORY_CARE_PROVIDER_SITE_OTHER): Payer: Medicare Other | Admitting: Physician Assistant

## 2013-02-04 ENCOUNTER — Encounter: Payer: Self-pay | Admitting: Physician Assistant

## 2013-02-04 VITALS — BP 156/92 | HR 92 | Ht 65.0 in | Wt 126.0 lb

## 2013-02-04 DIAGNOSIS — I340 Nonrheumatic mitral (valve) insufficiency: Secondary | ICD-10-CM

## 2013-02-04 DIAGNOSIS — I10 Essential (primary) hypertension: Secondary | ICD-10-CM

## 2013-02-04 DIAGNOSIS — I2510 Atherosclerotic heart disease of native coronary artery without angina pectoris: Secondary | ICD-10-CM

## 2013-02-04 DIAGNOSIS — I059 Rheumatic mitral valve disease, unspecified: Secondary | ICD-10-CM

## 2013-02-04 DIAGNOSIS — E7800 Pure hypercholesterolemia, unspecified: Secondary | ICD-10-CM

## 2013-02-04 DIAGNOSIS — I4891 Unspecified atrial fibrillation: Secondary | ICD-10-CM

## 2013-02-04 DIAGNOSIS — I5032 Chronic diastolic (congestive) heart failure: Secondary | ICD-10-CM

## 2013-02-04 MED ORDER — FUROSEMIDE 20 MG PO TABS: 20.0000 mg | ORAL_TABLET | Freq: Every day | ORAL | Status: DC

## 2013-02-04 NOTE — Patient Instructions (Signed)
START LASIX 20 MG DAILY; RX SENT IN TODAY  LAB WORK 02/12/13 FOR BMET  PLEASE FOLLOW UP WITH [place], [facility] 02/18/13 @ 11:30

## 2013-02-04 NOTE — Progress Notes (Signed)
[address], [place], [place]  [address] Phone: [phone] Fax:  [phone]  Date:  02/04/2013   ID:  DAKODA BERGMAN, DOB 1930/05/22, MRN [id number]  PCP:  Noralee Space, MD  Cardiologist:  Dr. Dorris Carnes   Electrophysiologist:  Dr. Cristopher Peru    History of Present Illness: Michelle Herrera is a 80 y.o. female with a hx of CAD, s/p NSTEMI in 05/2012 treated with BMS to RCA, permanent AFib, combined systolic and diastolic CHF, mitral regurgitation, DM2, HTN, HL. She has failed Tikosyn and amiodarone therapy.  She suffered an acute renal infarct 2/2 microemboli in 01/2011.  Echocardiogram 01/2011: Septal hypokinesis, mild LVH, EF 40-45%, diast dysfunction, mild-mod MR, mod-severe LAE, mod-severe RVE, mod-severe decreased RVSF, mod-severe LAE, moderate TR, PASP 45.    At time of LHC in 5/14, she had moderate MR on left ventriculogram.  F/u Echo 06/15/12: EF 55%, mild MR, severe BAE, mod TR, PASP 35.   Last seen by Dr. Cristopher Peru 11/2012 after a visit to the ED with epistaxis requiring electrocautery.  He reduced her Xarelto to 15 QD.  She was recently admitted with volume overload in the setting of AFib with RVR.  She was diuresed with IV Lasix and started on Cardizem 120 for rate control.  She returns for follow up.  Since discharge, she does feel that her breathing is improved. However, she describes chronic NYHA class III symptoms. She also notes occasional rapid palpitations. She denies chest discomfort. She denies orthopnea, PND. She denies syncope.   Recent Labs: 05/27/2012: ALT 8  10/18/2012: HDL Cholesterol 64.60; LDL (calc) 61  01/20/2013: Hemoglobin 13.3; Pro B Natriuretic peptide (BNP) 8203.0*; TSH 4.813*  01/22/2013: Creatinine 0.78; Potassium 3.7   Wt Readings from Last 3 Encounters:  02/04/13 126 lb (57.153 kg)  01/22/13 128 lb 4.8 oz (58.196 kg)  12/04/12 126 lb (57.153 kg)     Past Medical History  Diagnosis Date  . HTN (hypertension)   . Pure  hypercholesterolemia   . Type II or unspecified type diabetes mellitus without mention of complication, not stated as uncontrolled   . Anxiety state, unspecified   . Diaphragmatic hernia without mention of obstruction or gangrene   . Benign neoplasm of colon   . Mixed incontinence urge and stress (female)(female)   . Osteoarthrosis, unspecified whether generalized or localized, unspecified site   . Lumbago   . Osteoporosis, unspecified   . Memory loss   . Renal infarction     01/2011 in setting of low INR  . History of stroke   . AF (atrial fibrillation)     a. Chronic Xarelto  . CAD (coronary artery disease)     a. 05/2012 NSTEMI/Cath/PCI: LM nl, LAD min irregs, LCX min irregs, RI 40-50 ost, OM1 nl, RCA dom, 4m (4.0x12 Vision BMS), PD/PL nl, EF 55%, at least mod MR.  . Mitral regurgitation     a. mild to mod by echo 01/2011;  b. felt to be at least mod by LV gram 05/2012.  [name] Chronic combined systolic and diastolic CHF (congestive heart failure)     a. 01/2011 Echo: EF 40-45%, diff HK, diast dysfxn, mild to mod MR, mod to sev dil LA/RV/RA, mod to sev reduced RV fxn, mod TR, PASP 64mmHg.;  b.  Echo 06/15/12:  EF 55%, mild MR, severe BAE, mod TR, PASP 35  . Hx of cardiovascular stress test     a. Myoview in 2006 was negative for ischemia,  EF 54%.    Current Outpatient Prescriptions  Medication Sig Dispense Refill  . acetaminophen (TYLENOL) 500 MG tablet Take 500 mg by mouth every 6 (six) hours as needed.      [name] atenolol (TENORMIN) 50 MG tablet Take 50 mg by mouth 2 (two) times daily.      [name] atorvastatin (LIPITOR) 40 MG tablet Take 1 tablet (40 mg total) by mouth daily at 6 PM.  30 tablet  6  . diltiazem (CARDIZEM CD) 120 MG 24 hr capsule Take 1 capsule (120 mg total) by mouth daily.  30 capsule  0  . gabapentin (NEURONTIN) 100 MG capsule Take 1 capsule (100 mg total) by mouth 3 (three) times daily.  90 capsule  5  . glipiZIDE (GLUCOTROL) 10 MG tablet Take 2 tablets by mouth two times daily  120  tablet  6  . metFORMIN (GLUCOPHAGE) 500 MG tablet Take 1,000 mg by mouth 2 (two) times daily with a meal.       . Rivaroxaban (XARELTO) 15 MG TABS tablet Take 15 mg by mouth at bedtime.      . vitamin B-12 (CYANOCOBALAMIN) 1000 MCG tablet Take 1,000 mcg by mouth every morning.        No current facility-administered medications for this visit.    Allergies:   Peanut-containing drug products; Azithromycin; Flomax; Pioglitazone; Pregabalin; and Sulfonamide derivatives   Social History:  The patient  reports that she has never smoked. She has never used smokeless tobacco. She reports that she does not drink alcohol or use illicit drugs.   Family History:  The patient's family history includes CAD in her brother; Pneumonia in her mother; Stomach cancer in her father.   ROS:  Please see the history of present illness.   No bleeding problems.  No cough.   All other systems reviewed and negative.   PHYSICAL EXAM: VS:  BP 156/92  Pulse 92  Ht 5\' 5"  (1.651 m)  Wt 126 lb (57.153 kg)  BMI 20.97 kg/m2  SpO2 96% Well nourished, well developed, in no acute distress HEENT: normal Neck:  Minimally elevated JVD Cardiac:  normal S1, S2; irregularly irregular rhythm; no murmur Lungs:  clear to auscultation bilaterally, no wheezing, rhonchi or rales Abd: soft, nontender, no hepatomegaly Ext: 1+ bilateral LE edema Skin: warm and dry Neuro:  CNs 2-12 intact, no focal abnormalities noted  EKG:  AFib, HR 92, PVC, inf-lat TWI, no significant change when compared to prior tracings     ASSESSMENT AND PLAN:  1. Chronic Diastolic CHF:  She is NYHA Class 3.  I think she still has some mild volume excess.  She should be on daily diuretics.  I will resume Lasix 20 mg QD.  Check BMET in 1 week.   2. Atrial Fibrillation:  HR is somewhat elevated but controlled.  She does note rapid palpitations at times.  Question if volume excess is driving HR or vice versa.  Start Lasix as noted.  Will see her back in close  follow up.  If HR remains higher, will adjust Diltiazem.  Last echo demonstrates normal EF. So, think it is ok to continue her on Diltiazem.  Continue Xarelto. 3. Hypertension:  BP above target.  Start lasix.  Adjust Diltiazem if needed at follow up.   4. CAD:  No angina.  She is now off ASA as she is on Xarelto.  Continue statin. 5. Hyperlipidemia:  Continue statin. 6. Mitral Regurgitation:  Mild by last echo. 7. Disposition:  F/u with me in 2-3 weeks.    Signed, Richardson Dopp, PA-C  02/04/2013 10:33 AM

## 2013-02-12 ENCOUNTER — Other Ambulatory Visit: Payer: Medicare Other

## 2013-02-15 ENCOUNTER — Other Ambulatory Visit (INDEPENDENT_AMBULATORY_CARE_PROVIDER_SITE_OTHER): Payer: Medicare Other

## 2013-02-15 DIAGNOSIS — I10 Essential (primary) hypertension: Secondary | ICD-10-CM

## 2013-02-15 DIAGNOSIS — I4891 Unspecified atrial fibrillation: Secondary | ICD-10-CM

## 2013-02-15 DIAGNOSIS — I5032 Chronic diastolic (congestive) heart failure: Secondary | ICD-10-CM

## 2013-02-15 LAB — BASIC METABOLIC PANEL
BUN: 13 mg/dL (ref 6–23)
CHLORIDE: 91 meq/L — AB (ref 96–112)
CO2: 30 mEq/L (ref 19–32)
Calcium: 9.9 mg/dL (ref 8.4–10.5)
Creatinine, Ser: 0.7 mg/dL (ref 0.4–1.2)
GFR: 84.09 mL/min (ref >60.00)
Glucose, Bld: 358 mg/dL — ABNORMAL HIGH (ref 70–99)
POTASSIUM: 4 meq/L (ref 3.5–5.1)
Sodium: 131 mEq/L — ABNORMAL LOW (ref 135–145)

## 2013-02-18 ENCOUNTER — Encounter: Payer: Medicare Other | Admitting: Physician Assistant

## 2013-02-18 ENCOUNTER — Telehealth: Payer: Self-pay | Admitting: *Deleted

## 2013-02-18 NOTE — Telephone Encounter (Signed)
no answer I will try again later 

## 2013-02-18 NOTE — Progress Notes (Signed)
This encounter was created in error - please disregard.

## 2013-02-19 NOTE — Telephone Encounter (Signed)
pt notified about lab results with verbal understanding . I asked pt if she realized she missed her appt yesterday w/Scott W. PA, she said no. I rsc pt to see Brynda Rim. PA 02/26/13 @ 2:20, Pt said ok

## 2013-02-22 ENCOUNTER — Encounter: Payer: Self-pay | Admitting: Physician Assistant

## 2013-02-26 ENCOUNTER — Ambulatory Visit: Payer: Medicare Other | Admitting: Physician Assistant

## 2013-03-12 ENCOUNTER — Emergency Department (HOSPITAL_BASED_OUTPATIENT_CLINIC_OR_DEPARTMENT_OTHER): Payer: Medicare Other

## 2013-03-12 ENCOUNTER — Encounter (HOSPITAL_BASED_OUTPATIENT_CLINIC_OR_DEPARTMENT_OTHER): Payer: Self-pay | Admitting: Emergency Medicine

## 2013-03-12 ENCOUNTER — Inpatient Hospital Stay (HOSPITAL_BASED_OUTPATIENT_CLINIC_OR_DEPARTMENT_OTHER)
Admission: EM | Admit: 2013-03-12 | Discharge: 2013-03-15 | DRG: 281 | Disposition: A | Discharge status: Home / Self Care | Payer: Medicare Other | Attending: Cardiology | Admitting: Cardiology

## 2013-03-12 DIAGNOSIS — Z7901 Long term (current) use of anticoagulants: Secondary | ICD-10-CM

## 2013-03-12 DIAGNOSIS — I200 Unstable angina: Secondary | ICD-10-CM

## 2013-03-12 DIAGNOSIS — F0390 Unspecified dementia without behavioral disturbance: Secondary | ICD-10-CM | POA: Diagnosis present

## 2013-03-12 DIAGNOSIS — I252 Old myocardial infarction: Secondary | ICD-10-CM | POA: Diagnosis present

## 2013-03-12 DIAGNOSIS — E1149 Type 2 diabetes mellitus with other diabetic neurological complication: Secondary | ICD-10-CM | POA: Diagnosis present

## 2013-03-12 DIAGNOSIS — E876 Hypokalemia: Secondary | ICD-10-CM | POA: Diagnosis not present

## 2013-03-12 DIAGNOSIS — E1142 Type 2 diabetes mellitus with diabetic polyneuropathy: Secondary | ICD-10-CM | POA: Diagnosis present

## 2013-03-12 DIAGNOSIS — I4891 Unspecified atrial fibrillation: Principal | ICD-10-CM | POA: Diagnosis present

## 2013-03-12 DIAGNOSIS — I509 Heart failure, unspecified: Secondary | ICD-10-CM | POA: Diagnosis present

## 2013-03-12 DIAGNOSIS — Z8673 Personal history of transient ischemic attack (TIA), and cerebral infarction without residual deficits: Secondary | ICD-10-CM

## 2013-03-12 DIAGNOSIS — I214 Non-ST elevation (NSTEMI) myocardial infarction: Secondary | ICD-10-CM | POA: Diagnosis present

## 2013-03-12 DIAGNOSIS — E785 Hyperlipidemia, unspecified: Secondary | ICD-10-CM | POA: Diagnosis present

## 2013-03-12 DIAGNOSIS — I5042 Chronic combined systolic (congestive) and diastolic (congestive) heart failure: Secondary | ICD-10-CM | POA: Diagnosis present

## 2013-03-12 DIAGNOSIS — I4821 Permanent atrial fibrillation: Secondary | ICD-10-CM | POA: Diagnosis present

## 2013-03-12 DIAGNOSIS — I10 Essential (primary) hypertension: Secondary | ICD-10-CM | POA: Diagnosis present

## 2013-03-12 DIAGNOSIS — E538 Deficiency of other specified B group vitamins: Secondary | ICD-10-CM | POA: Diagnosis present

## 2013-03-12 DIAGNOSIS — I059 Rheumatic mitral valve disease, unspecified: Secondary | ICD-10-CM | POA: Diagnosis present

## 2013-03-12 DIAGNOSIS — Z79899 Other long term (current) drug therapy: Secondary | ICD-10-CM | POA: Diagnosis not present

## 2013-03-12 DIAGNOSIS — Z9861 Coronary angioplasty status: Secondary | ICD-10-CM

## 2013-03-12 DIAGNOSIS — R002 Palpitations: Secondary | ICD-10-CM | POA: Diagnosis present

## 2013-03-12 DIAGNOSIS — I2510 Atherosclerotic heart disease of native coronary artery without angina pectoris: Secondary | ICD-10-CM

## 2013-03-12 HISTORY — DX: Epistaxis: R04.0

## 2013-03-12 HISTORY — DX: Hypomagnesemia: E83.42

## 2013-03-12 LAB — CBC
HEMATOCRIT: 38.7 % (ref 36.0–46.0)
Hemoglobin: 13.2 g/dL (ref 12.0–15.0)
MCH: 30.2 pg (ref 26.0–34.0)
MCHC: 34.1 g/dL (ref 30.0–36.0)
MCV: 88.6 fL (ref 78.0–100.0)
Platelets: 159 10*3/uL (ref 150–400)
RBC: 4.37 MIL/uL (ref 3.87–5.11)
RDW: 14.7 % (ref 11.5–15.5)
WBC: 5.2 10*3/uL (ref 4.0–10.5)

## 2013-03-12 LAB — BASIC METABOLIC PANEL
BUN: 13 mg/dL (ref 6–23)
CHLORIDE: 95 meq/L — AB (ref 96–112)
CO2: 26 mEq/L (ref 19–32)
CREATININE: 0.7 mg/dL (ref 0.50–1.10)
Calcium: 10.2 mg/dL (ref 8.4–10.5)
GFR calc Af Amer: >90 mL/min (ref >90)
GFR calc non Af Amer: 80 mL/min — ABNORMAL LOW (ref >90)
Glucose, Bld: 431 mg/dL — ABNORMAL HIGH (ref 70–99)
Potassium: 4.8 mEq/L (ref 3.7–5.3)
Sodium: 136 mEq/L — ABNORMAL LOW (ref 137–147)

## 2013-03-12 LAB — PROTIME-INR
INR: 1.08 (ref 0.00–1.49)
Prothrombin Time: 13.8 seconds (ref 11.6–15.2)

## 2013-03-12 LAB — PRO B NATRIURETIC PEPTIDE: Pro B Natriuretic peptide (BNP): 3055 pg/mL — ABNORMAL HIGH (ref 0–450)

## 2013-03-12 LAB — TROPONIN I: Troponin I: <0.3 ng/mL (ref <0.30)

## 2013-03-12 MED ORDER — INSULIN REGULAR HUMAN 100 UNIT/ML IJ SOLN
5.0000 [IU] | Freq: Once | INTRAMUSCULAR | Status: AC
  Administered 2013-03-12: 5 [IU] via SUBCUTANEOUS
  Filled 2013-03-12: qty 1

## 2013-03-12 MED ORDER — NITROGLYCERIN 2 % TD OINT
1.0000 [in_us] | TOPICAL_OINTMENT | Freq: Four times a day (QID) | TRANSDERMAL | Status: DC
  Administered 2013-03-12 – 2013-03-13 (×2): 1 [in_us] via TOPICAL
  Filled 2013-03-12: qty 30
  Filled 2013-03-12: qty 1

## 2013-03-12 NOTE — ED Notes (Signed)
Pt. In no distress with c/o chest pain and palpations.  Rate is 120 -110  A fib noted on monitor. EKG done with noted A Fib.

## 2013-03-12 NOTE — ED Provider Notes (Signed)
CSN: [id number]     Arrival date & time 03/12/13  2015 History  This chart was scribed for Malvin Johns, MD by Elby Beck, ED Scribe. This patient was seen in room MH01/MH01 and the patient's care was started at 9:13 PM.  Chief Complaint  Patient presents with  . Palpitations    The history is provided by the patient. No language interpreter was used.    HPI Comments: Michelle Herrera is a 80 y.o. female who presents to the Emergency Department complaining of constant, gradually worsening chest pain onset about 2 hours ago. She describes her pain as "sharp, but not very sharp". She reports that this chest pain occasionally radiates to her stomach and back. She also reports that she is feeling her heart beat harder than normal tonight and she also reports associated SOB. She states that she has had similar episodes of chest pain in the past which have not lasted this long. She reports having a history of a 50% blockage in her chest which required a catheterization at [facility]. She reports that she has had a "head cold" recently, but no other symptoms. She denies, dizziness, nausea, vomiting, diarrhea, fever or any other symptoms. She reports that she is taking Xarelto as prescribed.  Cardiologist- Dr. Crissie Sickles   Past Medical History  Diagnosis Date  . HTN (hypertension)   . Pure hypercholesterolemia   . Type II or unspecified type diabetes mellitus without mention of complication, not stated as uncontrolled   . Anxiety state, unspecified   . Diaphragmatic hernia without mention of obstruction or gangrene   . Benign neoplasm of colon   . Mixed incontinence urge and stress (female)(female)   . Osteoarthrosis, unspecified whether generalized or localized, unspecified site   . Lumbago   . Osteoporosis, unspecified   . Memory loss   . Renal infarction     01/2011 in setting of low INR  . History of stroke   . AF (atrial fibrillation)     a. Chronic Xarelto  . CAD (coronary artery disease)      a. 05/2012 NSTEMI/Cath/PCI: LM nl, LAD min irregs, LCX min irregs, RI 40-50 ost, OM1 nl, RCA dom, 38m (4.0x12 Vision BMS), PD/PL nl, EF 55%, at least mod MR.  . Mitral regurgitation     a. mild to mod by echo 01/2011;  b. felt to be at least mod by LV gram 05/2012.  [name] Chronic combined systolic and diastolic CHF (congestive heart failure)     a. 01/2011 Echo: EF 40-45%, diff HK, diast dysfxn, mild to mod MR, mod to sev dil LA/RV/RA, mod to sev reduced RV fxn, mod TR, PASP 44mmHg.;  b.  Echo 06/15/12:  EF 55%, mild MR, severe BAE, mod TR, PASP 35  . Hx of cardiovascular stress test     a. Myoview in 2006 was negative for ischemia, EF 54%.   Past Surgical History  Procedure Laterality Date  . Cholecystectomy    . Rt.leg surgery  11/2010  . Cataract surgery/both eyes      01/2012 left eye---02/2012 right eye   Family History  Problem Relation Age of Onset  . Stomach cancer Father   . Pneumonia Mother   . CAD Brother    History  Substance Use Topics  . Smoking status: Never Smoker   . Smokeless tobacco: Never Used  . Alcohol Use: No   OB History   Grav Para Term Preterm Abortions TAB SAB Ect Mult Living  Review of Systems  Constitutional: Negative for fever, chills, diaphoresis and fatigue.  HENT: Negative for rhinorrhea and sneezing.   Eyes: Negative.   Respiratory: Positive for shortness of breath. Negative for cough and chest tightness.   Cardiovascular: Positive for chest pain and palpitations. Negative for leg swelling.  Gastrointestinal: Negative for nausea, vomiting, abdominal pain, diarrhea and blood in stool.  Genitourinary: Negative for frequency, hematuria, flank pain and difficulty urinating.  Musculoskeletal: Negative for arthralgias and back pain.  Skin: Negative for rash.  Neurological: Negative for dizziness, speech difficulty, weakness, numbness and headaches.    Allergies  Peanut-containing drug products; Azithromycin; Flomax; Pioglitazone;  Pregabalin; and Sulfonamide derivatives  Home Medications   Current Outpatient Rx  Name  Route  Sig  Dispense  Refill  . acetaminophen (TYLENOL) 500 MG tablet   Oral   Take 500 mg by mouth every 6 (six) hours as needed.         [name] atenolol (TENORMIN) 50 MG tablet   Oral   Take 50 mg by mouth 2 (two) times daily.         [name] atorvastatin (LIPITOR) 40 MG tablet   Oral   Take 1 tablet (40 mg total) by mouth daily at 6 PM.   30 tablet   6   . diltiazem (CARDIZEM CD) 120 MG 24 hr capsule   Oral   Take 1 capsule (120 mg total) by mouth daily.   30 capsule   0   . furosemide (LASIX) 20 MG tablet   Oral   Take 1 tablet (20 mg total) by mouth daily.   30 tablet   11   . gabapentin (NEURONTIN) 100 MG capsule   Oral   Take 1 capsule (100 mg total) by mouth 3 (three) times daily.   90 capsule   5   . glipiZIDE (GLUCOTROL) 10 MG tablet      Take 2 tablets by mouth two times daily   120 tablet   6   . metFORMIN (GLUCOPHAGE) 500 MG tablet   Oral   Take 1,000 mg by mouth 2 (two) times daily with a meal.          . Rivaroxaban (XARELTO) 15 MG TABS tablet   Oral   Take 15 mg by mouth at bedtime.         . vitamin B-12 (CYANOCOBALAMIN) 1000 MCG tablet   Oral   Take 1,000 mcg by mouth every morning.           Triage Vitals: BP 132/87  Pulse 105  Temp(Src) 98.7 F (37.1 C) (Oral)  Resp 20  Ht 5\' 5"  (1.651 m)  Wt 126 lb (57.153 kg)  BMI 20.97 kg/m2  SpO2 97%  Physical Exam  Constitutional: She is oriented to person, place, and time. She appears well-developed and well-nourished.  HENT:  Head: Normocephalic and atraumatic.  Eyes: Pupils are equal, round, and reactive to light.  Neck: Normal range of motion. Neck supple.  Cardiovascular: Normal heart sounds.   Irregularly irregular.  Pulmonary/Chest: Effort normal and breath sounds normal. No respiratory distress. She has no wheezes. She has no rales. She exhibits no tenderness.  Abdominal: Soft. Bowel  sounds are normal. There is no tenderness. There is no rebound and no guarding.  Musculoskeletal: Normal range of motion. She exhibits no edema.  Lymphadenopathy:    She has no cervical adenopathy.  Neurological: She is alert and oriented to person, place, and time.  Skin: Skin is warm and  dry. No rash noted.  Psychiatric: She has a normal mood and affect.    ED Course  Procedures (including critical care time)  DIAGNOSTIC STUDIES: Oxygen Saturation is 97% on RA, normal by my interpretation.    COORDINATION OF CARE: 9:20 PM- Discussed plan to obtain diagnostic lab work and a CXR, along with plan to order medications. Pt advised of plan for treatment and pt agrees.  Medications  nitroGLYCERIN (NITROGLYN) 2 % ointment 1 inch (1 inch Topical Given 03/12/13 2134)  insulin regular (NOVOLIN R,HUMULIN R) 100 units/mL injection 5 Units (5 Units Subcutaneous Given 03/12/13 2300)    Labs Review Results for orders placed during the hospital encounter of 03/12/13  CBC      Result Value Ref Range   WBC 5.2  4.0 - 10.5 K/uL   RBC 4.37  3.87 - 5.11 MIL/uL   Hemoglobin 13.2  12.0 - 15.0 g/dL   HCT 16.1  09.6 - 04.5 %   MCV 88.6  78.0 - 100.0 fL   MCH 30.2  26.0 - 34.0 pg   MCHC 34.1  30.0 - 36.0 g/dL   RDW 40.9  81.1 - 91.4 %   Platelets 159  150 - 400 K/uL  BASIC METABOLIC PANEL      Result Value Ref Range   Sodium 136 (*) 137 - 147 mEq/L   Potassium 4.8  3.7 - 5.3 mEq/L   Chloride 95 (*) 96 - 112 mEq/L   CO2 26  19 - 32 mEq/L   Glucose, Bld 431 (*) 70 - 99 mg/dL   BUN 13  6 - 23 mg/dL   Creatinine, Ser 7.82  0.50 - 1.10 mg/dL   Calcium 95.6  8.4 - 21.3 mg/dL   GFR calc non Af Amer 80 (*) >90 mL/min   GFR calc Af Amer >90  >90 mL/min  TROPONIN I      Result Value Ref Range   Troponin I <0.30  <0.30 ng/mL  PROTIME-INR      Result Value Ref Range   Prothrombin Time 13.8  11.6 - 15.2 seconds   INR 1.08  0.00 - 1.49  PRO B NATRIURETIC PEPTIDE      Result Value Ref Range   Pro B  Natriuretic peptide (BNP) 3055.0 (*) 0 - 450 pg/mL   Dg Chest 2 View  03/12/2013   CLINICAL DATA:  Palpitations.  EXAM: CHEST  2 VIEW  COMPARISON:  01/20/2013  FINDINGS: Chronic cardiomegaly. No edema or consolidation. There are small bilateral pleural effusions. Based on density, rounded nodular opacity at the right base is likely related to ossification of the the sixth costochondral junction, also seen on previous imaging (especially 04/16/2012). Mild atelectasis or scar in the right mid lung.  IMPRESSION: Trace bilateral pleural effusions.  No edema or consolidation.   Electronically Signed   By: Tiburcio Pea M.D.   On: 03/12/2013 21:36     Imaging Review Dg Chest 2 View  03/12/2013   CLINICAL DATA:  Palpitations.  EXAM: CHEST  2 VIEW  COMPARISON:  01/20/2013  FINDINGS: Chronic cardiomegaly. No edema or consolidation. There are small bilateral pleural effusions. Based on density, rounded nodular opacity at the right base is likely related to ossification of the the sixth costochondral junction, also seen on previous imaging (especially 04/16/2012). Mild atelectasis or scar in the right mid lung.  IMPRESSION: Trace bilateral pleural effusions.  No edema or consolidation.   Electronically Signed   By: Audry Riles.D.  On: 03/12/2013 21:36     EKG Interpretation   Date/Time:  Tuesday March 12 2013 20:27:55 EST Ventricular Rate:  117 PR Interval:    QRS Duration: 92 QT Interval:  342 QTC Calculation: 477 R Axis:   88 Text Interpretation:  Atrial fibrillation with rapid ventricular response  Nonspecific ST and T wave abnormality Abnormal ECG since last tracing no  significant change Confirmed by Othar Curto  MD, Taelar Gronewold ([id number]) on 03/12/2013  10:58:56 PM      MDM   Final diagnoses:  Unstable angina  Atrial fibrillation    Patient presents with chest pain and shortness of breath that started about 2 hours prior to arrival. She has a history of atrial fibrillation which she's on to  relative 4. Also in review of her records she had an admission in May of 2014 for an AMI and this resulted in a catheterization with bare-metal stenting of the RCA. She was given nitro paste in the ED. She is currently pain-free after that. Her troponin is negative. She did not have ischemic changes on EKG. I will consult cardiologist for admission and serial troponins.  Pt is on xarelto.  ASA/heparin not given.   I personally performed the services described in this documentation, which was scribed in my presence.  The recorded information has been reviewed and considered.   Malvin Johns, MD 03/12/13 2306

## 2013-03-13 ENCOUNTER — Encounter (HOSPITAL_COMMUNITY): Payer: Self-pay | Admitting: Physician Assistant

## 2013-03-13 DIAGNOSIS — I2510 Atherosclerotic heart disease of native coronary artery without angina pectoris: Secondary | ICD-10-CM

## 2013-03-13 DIAGNOSIS — Z8673 Personal history of transient ischemic attack (TIA), and cerebral infarction without residual deficits: Secondary | ICD-10-CM

## 2013-03-13 DIAGNOSIS — I4891 Unspecified atrial fibrillation: Secondary | ICD-10-CM

## 2013-03-13 DIAGNOSIS — I214 Non-ST elevation (NSTEMI) myocardial infarction: Secondary | ICD-10-CM

## 2013-03-13 DIAGNOSIS — I10 Essential (primary) hypertension: Secondary | ICD-10-CM

## 2013-03-13 DIAGNOSIS — I5042 Chronic combined systolic (congestive) and diastolic (congestive) heart failure: Secondary | ICD-10-CM

## 2013-03-13 LAB — GLUCOSE, CAPILLARY
GLUCOSE-CAPILLARY: 318 mg/dL — AB (ref 70–99)
GLUCOSE-CAPILLARY: 120 mg/dL — AB (ref 70–99)
GLUCOSE-CAPILLARY: 243 mg/dL — AB (ref 70–99)
GLUCOSE-CAPILLARY: 246 mg/dL — AB (ref 70–99)
GLUCOSE-CAPILLARY: 244 mg/dL — AB (ref 70–99)
Glucose-Capillary: 149 mg/dL — ABNORMAL HIGH (ref 70–99)
Glucose-Capillary: 309 mg/dL — ABNORMAL HIGH (ref 70–99)

## 2013-03-13 LAB — BASIC METABOLIC PANEL
BUN: 11 mg/dL (ref 6–23)
CALCIUM: 9.9 mg/dL (ref 8.4–10.5)
CO2: 25 meq/L (ref 19–32)
Chloride: 97 mEq/L (ref 96–112)
Creatinine, Ser: 0.63 mg/dL (ref 0.50–1.10)
GFR calc Af Amer: >90 mL/min (ref >90)
GFR, EST NON AFRICAN AMERICAN: 82 mL/min — AB (ref >90)
GLUCOSE: 119 mg/dL — AB (ref 70–99)
POTASSIUM: 3.4 meq/L — AB (ref 3.7–5.3)
SODIUM: 137 meq/L (ref 137–147)

## 2013-03-13 LAB — TROPONIN I
TROPONIN I: 0.36 ng/mL — AB (ref <0.30)
TROPONIN I: 0.36 ng/mL — AB (ref <0.30)
Troponin I: 0.41 ng/mL (ref <0.30)
Troponin I: 0.5 ng/mL (ref <0.30)

## 2013-03-13 LAB — CBC
HCT: 37.4 % (ref 36.0–46.0)
HEMOGLOBIN: 13 g/dL (ref 12.0–15.0)
MCH: 30.1 pg (ref 26.0–34.0)
MCHC: 34.8 g/dL (ref 30.0–36.0)
MCV: 86.6 fL (ref 78.0–100.0)
Platelets: 167 10*3/uL (ref 150–400)
RBC: 4.32 MIL/uL (ref 3.87–5.11)
RDW: 14.7 % (ref 11.5–15.5)
WBC: 5.4 10*3/uL (ref 4.0–10.5)

## 2013-03-13 LAB — TSH: TSH: 2.842 u[IU]/mL (ref 0.350–4.500)

## 2013-03-13 LAB — MAGNESIUM: MAGNESIUM: 1.5 mg/dL (ref 1.5–2.5)

## 2013-03-13 LAB — T4, FREE: FREE T4: 1.31 ng/dL (ref 0.80–1.80)

## 2013-03-13 MED ORDER — ATENOLOL 50 MG PO TABS
50.0000 mg | ORAL_TABLET | Freq: Two times a day (BID) | ORAL | Status: DC
  Administered 2013-03-13 – 2013-03-15 (×5): 50 mg via ORAL
  Filled 2013-03-13 (×7): qty 1

## 2013-03-13 MED ORDER — MAGNESIUM SULFATE 40 MG/ML IJ SOLN
2.0000 g | Freq: Once | INTRAMUSCULAR | Status: AC
  Administered 2013-03-13: 2 g via INTRAVENOUS
  Filled 2013-03-13: qty 50

## 2013-03-13 MED ORDER — GABAPENTIN 100 MG PO CAPS
100.0000 mg | ORAL_CAPSULE | Freq: Three times a day (TID) | ORAL | Status: DC
  Administered 2013-03-13 – 2013-03-15 (×7): 100 mg via ORAL
  Filled 2013-03-13 (×10): qty 1

## 2013-03-13 MED ORDER — SODIUM CHLORIDE 0.9 % IV SOLN
1.0000 mL/kg/h | INTRAVENOUS | Status: DC
  Administered 2013-03-14: 1 mL/kg/h via INTRAVENOUS

## 2013-03-13 MED ORDER — FUROSEMIDE 10 MG/ML IJ SOLN
INTRAMUSCULAR | Status: AC
  Filled 2013-03-13: qty 4

## 2013-03-13 MED ORDER — VITAMIN B-12 1000 MCG PO TABS
1000.0000 ug | ORAL_TABLET | Freq: Every morning | ORAL | Status: DC
  Administered 2013-03-13 – 2013-03-15 (×3): 1000 ug via ORAL
  Filled 2013-03-13 (×3): qty 1

## 2013-03-13 MED ORDER — ISOSORBIDE MONONITRATE ER 30 MG PO TB24
30.0000 mg | ORAL_TABLET | Freq: Every day | ORAL | Status: DC
  Administered 2013-03-13 – 2013-03-14 (×2): 30 mg via ORAL
  Filled 2013-03-13 (×2): qty 1

## 2013-03-13 MED ORDER — NITROGLYCERIN 0.4 MG SL SUBL: 0.4000 mg | SUBLINGUAL_TABLET | SUBLINGUAL | Status: DC | PRN

## 2013-03-13 MED ORDER — RIVAROXABAN 15 MG PO TABS
15.0000 mg | ORAL_TABLET | Freq: Every day | ORAL | Status: DC
  Administered 2013-03-13: 15 mg via ORAL
  Filled 2013-03-13 (×2): qty 1

## 2013-03-13 MED ORDER — ATORVASTATIN CALCIUM 40 MG PO TABS
40.0000 mg | ORAL_TABLET | Freq: Every day | ORAL | Status: DC
  Administered 2013-03-13 – 2013-03-14 (×2): 40 mg via ORAL
  Filled 2013-03-13 (×3): qty 1

## 2013-03-13 MED ORDER — SODIUM CHLORIDE 0.9 % IJ SOLN
3.0000 mL | Freq: Two times a day (BID) | INTRAMUSCULAR | Status: DC
  Administered 2013-03-13 – 2013-03-14 (×4): 3 mL via INTRAVENOUS

## 2013-03-13 MED ORDER — RIVAROXABAN 15 MG PO TABS: 15.0000 mg | ORAL_TABLET | Freq: Every day | ORAL | Status: DC

## 2013-03-13 MED ORDER — ASPIRIN EC 81 MG PO TBEC
81.0000 mg | DELAYED_RELEASE_TABLET | Freq: Every day | ORAL | Status: DC
  Administered 2013-03-13 – 2013-03-15 (×3): 81 mg via ORAL
  Filled 2013-03-13 (×3): qty 1

## 2013-03-13 MED ORDER — INSULIN ASPART 100 UNIT/ML ~~LOC~~ SOLN
0.0000 [IU] | SUBCUTANEOUS | Status: DC
  Administered 2013-03-13: 7 [IU] via SUBCUTANEOUS
  Administered 2013-03-13: 4 [IU] via SUBCUTANEOUS
  Administered 2013-03-13: 1 [IU] via SUBCUTANEOUS
  Administered 2013-03-13 – 2013-03-14 (×2): 3 [IU] via SUBCUTANEOUS
  Administered 2013-03-14: 7 [IU] via SUBCUTANEOUS

## 2013-03-13 MED ORDER — DILTIAZEM HCL ER COATED BEADS 120 MG PO CP24
120.0000 mg | ORAL_CAPSULE | Freq: Every day | ORAL | Status: DC
  Administered 2013-03-13 – 2013-03-15 (×2): 120 mg via ORAL
  Filled 2013-03-13 (×5): qty 1

## 2013-03-13 MED ORDER — ASPIRIN 325 MG PO TABS
325.0000 mg | ORAL_TABLET | Freq: Once | ORAL | Status: AC
  Administered 2013-03-13: 325 mg via ORAL
  Filled 2013-03-13: qty 1

## 2013-03-13 MED ORDER — INSULIN ASPART 100 UNIT/ML ~~LOC~~ SOLN: 0.0000 [IU] | Freq: Three times a day (TID) | SUBCUTANEOUS | Status: DC

## 2013-03-13 MED ORDER — SODIUM CHLORIDE 0.9 % IJ SOLN: 3.0000 mL | INTRAMUSCULAR | Status: DC | PRN

## 2013-03-13 MED ORDER — HEPARIN (PORCINE) IN NACL 100-0.45 UNIT/ML-% IJ SOLN
500.0000 [IU]/h | INTRAMUSCULAR | Status: DC
  Administered 2013-03-14: 500 [IU]/h via INTRAVENOUS
  Filled 2013-03-13 (×2): qty 250

## 2013-03-13 MED ORDER — FUROSEMIDE 20 MG PO TABS: 20.0000 mg | ORAL_TABLET | Freq: Every day | ORAL | Status: DC

## 2013-03-13 MED ORDER — FUROSEMIDE 10 MG/ML IJ SOLN
20.0000 mg | Freq: Once | INTRAMUSCULAR | Status: AC
  Administered 2013-03-13: 20 mg via INTRAVENOUS

## 2013-03-13 MED ORDER — POTASSIUM CHLORIDE CRYS ER 20 MEQ PO TBCR
20.0000 meq | EXTENDED_RELEASE_TABLET | Freq: Once | ORAL | Status: AC
  Administered 2013-03-13: 20 meq via ORAL
  Filled 2013-03-13: qty 1

## 2013-03-13 MED ORDER — ASPIRIN 81 MG PO CHEW
81.0000 mg | CHEWABLE_TABLET | ORAL | Status: DC
  Filled 2013-03-13: qty 1

## 2013-03-13 MED ORDER — SODIUM CHLORIDE 0.9 % IV SOLN: 250.0000 mL | INTRAVENOUS | Status: DC | PRN

## 2013-03-13 MED ORDER — HEPARIN (PORCINE) IN NACL 100-0.45 UNIT/ML-% IJ SOLN: 500.0000 [IU]/h | INTRAMUSCULAR | Status: DC

## 2013-03-13 NOTE — Progress Notes (Signed)
CRITICAL VALUE ALERT  Critical value received:  Troponin   Date of notification:  03/13/13  Time of notification:  0503  Critical value read back: yes   Nurse who received alert:  Quentin Angst  MD notified (1st page):  [place]  Time of first page:  0505  Responding MD:  Colon Flattery  Time MD responded:  [phone]

## 2013-03-13 NOTE — Progress Notes (Signed)
ANTICOAGULATION CONSULT NOTE - Initial Consult  Pharmacy Consult for heparin Indication: chest pain/ACS and atrial fibrillation  Allergies  Allergen Reactions  . Peanut-Containing Drug Products Anaphylaxis and Swelling  . Azithromycin     REACTION: pt states INTOL to ZPak  . Flomax [Tamsulosin Hcl] Swelling    swelling  . Pioglitazone     edema  . Pregabalin     REACTION: pt states INTOL to Lyrica  . Sulfonamide Derivatives Swelling    REACTION: SWELLING    Patient Measurements: Height: 5\' 5"  (165.1 cm) Weight: 111 lb 8.8 oz (50.6 kg) IBW/kg (Calculated) : 57  Vital Signs: Temp: 97.9 F (36.6 C) (03/04 0418) Temp src: Oral (03/04 0418) BP: 121/74 mmHg (03/04 0849) Pulse Rate: 78 (03/04 0849)  Labs:  Recent Labs  03/12/13 2101 03/13/13 0400 03/13/13 0735  HGB 13.2 13.0  --   HCT 38.7 37.4  --   PLT 159 167  --   LABPROT 13.8  --   --   INR 1.08  --   --   CREATININE 0.70 0.63  --   TROPONINI <0.30 0.36* 0.36*    Estimated Creatinine Clearance: 44.8 ml/min (by C-G formula based on Cr of 0.63).   Medical History: Past Medical History  Diagnosis Date  . HTN (hypertension)   . Pure hypercholesterolemia   . Type II or unspecified type diabetes mellitus without mention of complication, not stated as uncontrolled   . Anxiety state, unspecified   . Diaphragmatic hernia without mention of obstruction or gangrene   . Benign neoplasm of colon   . Mixed incontinence urge and stress (female)(female)   . Osteoarthrosis, unspecified whether generalized or localized, unspecified site   . Lumbago   . Osteoporosis, unspecified   . Memory loss   . Renal infarction     01/2011 in setting of low INR  . History of stroke   . AF (atrial fibrillation)     a. Chronic Xarelto  . CAD (coronary artery disease)     a. 05/2012 NSTEMI/Cath/PCI: LM nl, LAD min irregs, LCX min irregs, RI 40-50 ost, OM1 nl, RCA dom, 60m (4.0x12 Vision BMS), PD/PL nl, EF 55%, at least mod MR.  .  Mitral regurgitation     a. mild by echo 06/2012.  [name] Chronic combined systolic and diastolic CHF (congestive heart failure)     a. 01/2011 Echo: EF 40-45%, diff HK, diast dysfxn, mild to mod MR, mod to sev dil LA/RV/RA, mod to sev reduced RV fxn, mod TR, PASP 19mmHg.;  b.  Echo 06/15/12:  EF 55%, mild MR, severe BAE, mod TR, PASP 35  . Hx of cardiovascular stress test     a. Myoview in 2006 was negative for ischemia, EF 54%. Martin Majestic on to have CAD development 05/2012.)    Medications:  Prescriptions prior to admission  Medication Sig Dispense Refill  . acetaminophen (TYLENOL) 500 MG tablet Take 500 mg by mouth every 6 (six) hours as needed.      [name] atenolol (TENORMIN) 50 MG tablet Take 50 mg by mouth 2 (two) times daily.      [name] atorvastatin (LIPITOR) 40 MG tablet Take 1 tablet (40 mg total) by mouth daily at 6 PM.  30 tablet  6  . diltiazem (CARDIZEM CD) 120 MG 24 hr capsule Take 1 capsule (120 mg total) by mouth daily.  30 capsule  0  . furosemide (LASIX) 20 MG tablet Take 1 tablet (20 mg total) by mouth daily.  [place]  tablet  11  . gabapentin (NEURONTIN) 100 MG capsule Take 1 capsule (100 mg total) by mouth 3 (three) times daily.  90 capsule  5  . glipiZIDE (GLUCOTROL) 10 MG tablet Take 2 tablets by mouth two times daily  120 tablet  6  . metFORMIN (GLUCOPHAGE) 500 MG tablet Take 1,000 mg by mouth 2 (two) times daily with a meal.       . Rivaroxaban (XARELTO) 15 MG TABS tablet Take 15 mg by mouth at bedtime.      . vitamin B-12 (CYANOCOBALAMIN) 1000 MCG tablet Take 1,000 mcg by mouth every morning.        Scheduled:  . aspirin EC  81 mg Oral Daily  . atenolol  50 mg Oral BID  . atorvastatin  40 mg Oral q1800  . diltiazem  120 mg Oral Daily  . gabapentin  100 mg Oral TID  . insulin aspart  0-9 Units Subcutaneous Q4H  . isosorbide mononitrate  30 mg Oral Daily  . magnesium sulfate 1 - 4 g bolus IVPB  2 g Intravenous Once  . potassium chloride  20 mEq Oral Once  . potassium chloride  20 mEq Oral  Once  . sodium chloride  3 mL Intravenous Q12H  . vitamin B-12  1,000 mcg Oral q morning - 10a    Assessment: 80yo female c/o CP and palpitations, noted to be in Afib to 120s, on Xarelto PTA for Afib, now with rising troponin, to hold Xarelto and begin heparin; last dose of Xarelto 3/4 0126. Heparin was dced then decided to do cath tomorrow. Will hold xarelto now and start heparin tonight.   Goal of Therapy:  Heparin level = 0.3-0.7 PTT = 66-102 Monitor platelets by anticoagulation protocol: Yes   Plan:  Will begin heparin gtt at 500 units/hr (previously therapeutic at this rate) starting 24hr after last Xarelto dose and monitor heparin levels, PTT, and CBC.

## 2013-03-13 NOTE — Progress Notes (Signed)
Will hold Lasix tomorrow in prep for cath. Will also supplement K with KCl and Mg with 2g Mag sulfate given QTc prolongation.  We initially cancelled heparin per pharmacy order and resumed Xarelto, but after seeing EKG and planning cath tomorrow, we discontinued Xarelto and put order back in for heparin per pharmacy (to be dosed based on last Xarelto dose). Lissa Rowles PA-C

## 2013-03-13 NOTE — H&P (Signed)
Cardiology History and Physical  NADEL,SCOTT M, MD  History of Present Illness (and review of medical records): Michelle Herrera is a 80 y.o. female who presents for evaluation of palpitations.  She has a hx of permanent AFib, combined systolic and diastolic CHF, DM2, HTN, HL. She has failed Tikosyn and amiodarone therapy. Myoview in 2006 was negative for ischemia, EF 54%. Admitted 05/2012 for a non-STEMI. LHC 05/28/12: oRI 40-50%, mid RCA 95%, EF 55%. PCI: Vision BMS to the mid RCA. There was moderate MR on left ventriculogram. Outpatient echocardiogram was recommended. Echo 06/15/12: EF 55%, mild MR, severe BAE, mod TR, PASP 35. She was to remain on Plavix and Xarelto for 30 days, then stay on ASA and Xarelto. She has since had her ASA held due to nosebleeds.  She now presents with palpitations. She went to bed around 730pm and felt acute onset of rapid heart rate.  She reported only shortness of breath.  She had mild chest discomfort due to rapid heart rate, but no chest pain.  She felt that symptoms got progessively worse and thus prompted son to take her to [place] for evaluation.  There she was found to be in Afib with RVR.  She was given NTG.  She was transferred to [facility] for further management.  She now feels much improved.  HR is low 100s. She denies chest pain or shortness of breath.  Review of Systems Further review of systems was otherwise negative other than stated in HPI.  Patient Active Problem List   Diagnosis Date Noted  . Unstable angina 03/12/2013  . Acute CHF 01/20/2013  . Hypokalemia 01/20/2013  . Hyperglycemia 01/20/2013  . Epistaxis 12/04/2012  . Mitral regurgitation 05/29/2012  . Chronic combined systolic and diastolic CHF (congestive heart failure)   . Acute myocardial infarction, subendocardial infarction, initial episode of care 05/26/2012  . Chest pain 05/25/2012  . Type II or unspecified type diabetes mellitus with neurological manifestations, uncontrolled(250.62)  05/18/2011  . Vitamin B 12 deficiency 04/19/2011  . Renal infarction 01/31/2011  . History of stroke 08/16/2010  . Anemia 04/14/2010  . Atrial fibrillation 03/10/2010  . Long term current use of anticoagulant 03/10/2010  . SHOULDER PAIN, RIGHT 04/13/2009  . UNSTEADY GAIT 11/03/2008  . BACK PAIN, LUMBAR 10/14/2008  . MEMORY LOSS 07/29/2007  . URINARY INCONTINENCE, MIXED 07/29/2007  . COLONIC POLYPS 01/23/2007  . HYPERCHOLESTEROLEMIA 01/23/2007  . ANXIETY 01/23/2007  . HYPERTENSION 01/23/2007  . CHF 01/23/2007  . HIATAL HERNIA 01/23/2007  . DEGENERATIVE JOINT DISEASE 01/23/2007  . OSTEOPOROSIS 01/23/2007   Past Medical History  Diagnosis Date  . HTN (hypertension)   . Pure hypercholesterolemia   . Type II or unspecified type diabetes mellitus without mention of complication, not stated as uncontrolled   . Anxiety state, unspecified   . Diaphragmatic hernia without mention of obstruction or gangrene   . Benign neoplasm of colon   . Mixed incontinence urge and stress (female)(female)   . Osteoarthrosis, unspecified whether generalized or localized, unspecified site   . Lumbago   . Osteoporosis, unspecified   . Memory loss   . Renal infarction     01/2011 in setting of low INR  . History of stroke   . AF (atrial fibrillation)     a. Chronic Xarelto  . CAD (coronary artery disease)     a. 05/2012 NSTEMI/Cath/PCI: LM nl, LAD min irregs, LCX min irregs, RI 40-50 ost, OM1 nl, RCA dom, 23m(4.0x12 Vision BMS), PD/PL nl, EF 55%,  at least mod MR.  . Mitral regurgitation     a. mild to mod by echo 01/2011;  b. felt to be at least mod by LV gram 05/2012.  [name] Chronic combined systolic and diastolic CHF (congestive heart failure)     a. 01/2011 Echo: EF 40-45%, diff HK, diast dysfxn, mild to mod MR, mod to sev dil LA/RV/RA, mod to sev reduced RV fxn, mod TR, PASP 75mHg.;  b.  Echo 06/15/12:  EF 55%, mild MR, severe BAE, mod TR, PASP 35  . Hx of cardiovascular stress test     a. Myoview in 2006  was negative for ischemia, EF 54%.    Past Surgical History  Procedure Laterality Date  . Cholecystectomy    . Rt.leg surgery  11/2010  . Cataract surgery/both eyes      01/2012 left eye---02/2012 right eye    Prescriptions prior to admission  Medication Sig Dispense Refill  . acetaminophen (TYLENOL) 500 MG tablet Take 500 mg by mouth every 6 (six) hours as needed.      .Marland Kitchenatenolol (TENORMIN) 50 MG tablet Take 50 mg by mouth 2 (two) times daily.      .Marland Kitchenatorvastatin (LIPITOR) 40 MG tablet Take 1 tablet (40 mg total) by mouth daily at 6 PM.  30 tablet  6  . diltiazem (CARDIZEM CD) 120 MG 24 hr capsule Take 1 capsule (120 mg total) by mouth daily.  30 capsule  0  . furosemide (LASIX) 20 MG tablet Take 1 tablet (20 mg total) by mouth daily.  30 tablet  11  . gabapentin (NEURONTIN) 100 MG capsule Take 1 capsule (100 mg total) by mouth 3 (three) times daily.  90 capsule  5  . glipiZIDE (GLUCOTROL) 10 MG tablet Take 2 tablets by mouth two times daily  120 tablet  6  . metFORMIN (GLUCOPHAGE) 500 MG tablet Take 1,000 mg by mouth 2 (two) times daily with a meal.       . Rivaroxaban (XARELTO) 15 MG TABS tablet Take 15 mg by mouth at bedtime.      . vitamin B-12 (CYANOCOBALAMIN) 1000 MCG tablet Take 1,000 mcg by mouth every morning.        Allergies  Allergen Reactions  . Peanut-Containing Drug Products Anaphylaxis and Swelling  . Azithromycin     REACTION: pt states INTOL to ZPak  . Flomax [Tamsulosin Hcl] Swelling    swelling  . Pioglitazone     edema  . Pregabalin     REACTION: pt states INTOL to Lyrica  . Sulfonamide Derivatives Swelling    REACTION: SWELLING    History  Substance Use Topics  . Smoking status: Never Smoker   . Smokeless tobacco: Never Used  . Alcohol Use: No    Family History  Problem Relation Age of Onset  . Stomach cancer Father   . Pneumonia Mother   . CAD Brother      Objective:  Patient Vitals for the past 8 hrs:  BP Temp Temp src Pulse Resp SpO2 Height  Weight  03/13/13 0015 146/117 mmHg - - 101 18 98 % - -  03/13/13 0000 157/88 mmHg - - 83 21 97 % - -  03/12/13 2330 154/97 mmHg - - 88 0 98 % - -  03/12/13 2300 155/102 mmHg - - 94 25 100 % - -  03/12/13 2230 144/87 mmHg - - 92 17 96 % - -  03/12/13 2134 127/88 mmHg - - - 21 - - -  03/12/13 2115 - - - 95 23 96 % - -  03/12/13 2100 - - - 98 20 98 % - -  03/12/13 2045 - - - 89 22 96 % - -  03/12/13 2036 132/87 mmHg 98.7 F (37.1 C) Oral 105 20 97 % 5' 5"  (1.651 m) 57.153 kg (126 lb)  03/12/13 2030 132/87 mmHg - - 63 - 94 % - -   General appearance: alert, cooperative, appears stated age and no distress Head: Normocephalic, without obvious abnormality, atraumatic Eyes: conjunctivae/corneas clear. PERRL, EOM's intact. Fundi benign. Neck: no JVD Lungs: clear to auscultation bilaterally Chest wall: no tenderness Heart: irregular rate and rhythm, S1, S2 normal Abdomen: soft, non-tender; bowel sounds normal; no masses,  no organomegaly Extremities: extremities normal, atraumatic, no cyanosis or edema Pulses: 2+ and symmetric Neurologic: Grossly normal  Results for orders placed during the hospital encounter of 03/12/13 (from the past 48 hour(s))  CBC     Status: None   Collection Time    03/12/13  9:01 PM      Result Value Ref Range   WBC 5.2  4.0 - 10.5 K/uL   RBC 4.37  3.87 - 5.11 MIL/uL   Hemoglobin 13.2  12.0 - 15.0 g/dL   HCT 38.7  36.0 - 46.0 %   MCV 88.6  78.0 - 100.0 fL   MCH 30.2  26.0 - 34.0 pg   MCHC 34.1  30.0 - 36.0 g/dL   RDW 14.7  11.5 - 15.5 %   Platelets 159  150 - 400 K/uL  BASIC METABOLIC PANEL     Status: Abnormal   Collection Time    03/12/13  9:01 PM      Result Value Ref Range   Sodium 136 (*) 137 - 147 mEq/L   Potassium 4.8  3.7 - 5.3 mEq/L   Chloride 95 (*) 96 - 112 mEq/L   CO2 26  19 - 32 mEq/L   Glucose, Bld 431 (*) 70 - 99 mg/dL   BUN 13  6 - 23 mg/dL   Creatinine, Ser 0.70  0.50 - 1.10 mg/dL   Calcium 10.2  8.4 - 10.5 mg/dL   GFR calc non Af  Amer 80 (*) >90 mL/min   GFR calc Af Amer >90  >90 mL/min   Comment: (NOTE)     The eGFR has been calculated using the CKD EPI equation.     This calculation has not been validated in all clinical situations.     eGFR's persistently <90 mL/min signify possible Chronic Kidney     Disease.  TROPONIN I     Status: None   Collection Time    03/12/13  9:01 PM      Result Value Ref Range   Troponin I <0.30  <0.30 ng/mL   Comment:            Due to the release kinetics of cTnI,     a negative result within the first hours     of the onset of symptoms does not rule out     myocardial infarction with certainty.     If myocardial infarction is still suspected,     repeat the test at appropriate intervals.  PROTIME-INR     Status: None   Collection Time    03/12/13  9:01 PM      Result Value Ref Range   Prothrombin Time 13.8  11.6 - 15.2 seconds   INR 1.08  0.00 - 1.49  PRO  B NATRIURETIC PEPTIDE     Status: Abnormal   Collection Time    03/12/13  9:01 PM      Result Value Ref Range   Pro B Natriuretic peptide (BNP) 3055.0 (*) 0 - 450 pg/mL   Dg Chest 2 View  03/12/2013   CLINICAL DATA:  Palpitations.  EXAM: CHEST  2 VIEW  COMPARISON:  01/20/2013  FINDINGS: Chronic cardiomegaly. No edema or consolidation. There are small bilateral pleural effusions. Based on density, rounded nodular opacity at the right base is likely related to ossification of the the sixth costochondral junction, also seen on previous imaging (especially 04/16/2012). Mild atelectasis or scar in the right mid lung.  IMPRESSION: Trace bilateral pleural effusions.  No edema or consolidation.   Electronically Signed   By: Jorje Guild M.D.   On: 03/12/2013 21:36    ECG:  Atrial fibrillation with RVR HR 117, NS ST-T changes  ASSESSMENT AND PLAN:  1. Atrial Fibrillation RVR -Cont BB and Dilt for rate control -Xarelto  2. CAD s/p bare-metal stent. Off ASA due to nosebleeds. Denies chest pain.  Cycle enzymes r/o MI. Will  give ASA 325 x 1. Continue Statin.   3.   Chronic Combined Systolic and Diastolic CHF: doesn't appear significantly volume overloaded, BNP elevated, maybe 2/2 to Afib with RVR.  Give Lasix 90m IV x 1, then reassess.  Continue Lasix daily, monitor strict I/Os, daily weights.   4.  Hypertension: BP meds as above  5.  DM, hyperglycemia- Hold metformin, SSI   Addendum:  Repeat troponin 0.36. Plan:  -Continue trend enzymes -Hold Xarelto -ASA, Heparin -May need further ischemic evaluation with cardiac catheterization.  Had BMS to RCA last 05/2012. -Keep NPO

## 2013-03-13 NOTE — Progress Notes (Addendum)
Patient: Michelle Herrera / Admit Date: 03/12/2013 / Date of Encounter: 03/13/2013, 8:30 AM   Subjective  Denies pain or SOB. Somewhat sleepy this AM but arousable, oriented, no focal deficits. Became more awake once her finger was stuck for CBG. Says her heart started hurting last night around 5pm with heart racing but is otherwise a limited historian. CBG 120 and BP 120s/70s.  Objective   Telemetry: atrial fib, rate controlled  Physical Exam: Blood pressure 110/71, pulse 83, temperature 97.9 F (36.6 C), temperature source Oral, resp. rate 16, height 5\' 5"  (1.651 m), weight 111 lb 8.8 oz (50.6 kg), SpO2 96.00%. General: Well developed, thin elderly WF, in no acute distress. Head: Normocephalic, atraumatic, sclera non-icteric, no xanthomas, nares are without discharge. Neck: Negative for carotid bruits. JVP not elevated. Lungs: Clear bilaterally to auscultation without wheezes, rales, or rhonchi. Breathing is unlabored. Heart: Irr Irr S1 S2 without murmurs, rubs, or gallops.  Abdomen: Soft, non-tender, non-distended with normoactive bowel sounds. No rebound/guarding. Extremities: No clubbing or cyanosis. No edema. Distal pedal pulses are 2+ and equal bilaterally. Neuro: Alert and oriented X 3 but somewhat sleepy this AM, now awake. Moves all extremities spontaneously. Strength equal bilaterally.  Intake/Output Summary (Last 24 hours) at 03/13/13 0830 Last data filed at 03/13/13 6948  Gross per 24 hour  Intake      0 ml  Output    450 ml  Net   -450 ml    Inpatient Medications:  . aspirin EC  81 mg Oral Daily  . atenolol  50 mg Oral BID  . atorvastatin  40 mg Oral q1800  . diltiazem  120 mg Oral Daily  . [START ON 03/14/2013] furosemide  20 mg Oral Daily  . gabapentin  100 mg Oral TID  . insulin aspart  0-9 Units Subcutaneous Q4H  . nitroGLYCERIN  1 inch Topical 4 times per day  . potassium chloride  20 mEq Oral Once  . sodium chloride  3 mL Intravenous Q12H  . vitamin B-12   1,000 mcg Oral q morning - 10a   Infusions:  . [START ON 03/14/2013] heparin      Labs:  Recent Labs  03/12/13 2101 03/13/13 0400  NA 136* 137  K 4.8 3.4*  CL 95* 97  CO2 26 25  GLUCOSE 431* 119*  BUN 13 11  CREATININE 0.70 0.63  CALCIUM 10.2 9.9  MG  --  1.5    Recent Labs  03/12/13 2101 03/13/13 0400  WBC 5.2 5.4  HGB 13.2 13.0  HCT 38.7 37.4  MCV 88.6 86.6  PLT 159 167    Recent Labs  03/12/13 2101 03/13/13 0400  TROPONINI <0.30 0.36*    Radiology/Studies:  Dg Chest 2 View  03/12/2013   CLINICAL DATA:  Palpitations.  EXAM: CHEST  2 VIEW  COMPARISON:  01/20/2013  FINDINGS: Chronic cardiomegaly. No edema or consolidation. There are small bilateral pleural effusions. Based on density, rounded nodular opacity at the right base is likely related to ossification of the the sixth costochondral junction, also seen on previous imaging (especially 04/16/2012). Mild atelectasis or scar in the right mid lung.  IMPRESSION: Trace bilateral pleural effusions.  No edema or consolidation.   Electronically Signed   By: Jorje Guild M.D.   On: 03/12/2013 21:36     Assessment and Plan  1. Permanent atrial fibrillation, admitted with RVR: has failed Tikosyn and amiodarone therapy. Is on anticoagulation as outpatient (h/o renal infarct 01/2011). Trigger for rapid rates  unclear, ? Hyperglycemia. Now controlled on home regimen. 2. CAD with h/o NSTEMI/BMS to RCA 05/2012 tx with Plavix (+Xarelto) for 30 days then ASA/Xarelto only until ASA was discontinued 11/2012 due to epistaxis. This admission - troponin <0.30->0.36. Will discuss further management with MD. Not clear if ischemia triggered rapid rates, or if troponin is product of demand ischemia. She does endorse CP last night. Last cath 05/2012. Off ASA as outpatient due to epistaxis s/p electrocautery in 11/2012. Pharmacy dosing heparin based on last Xarelto dose. 3. H/o epistaxis, not on ASA as outpatient due to this - s/p electrocautery  in 11/2012. stable. Denies recent bleeding. 4. Chronic combined systolic and diastolic CHF EF 85% 09/2922 - will ask for daily weights. Does not appear volume overloaded. Resume Lasix 20mg  daily tomorrow. 5. Mitral regurgitation - mod by cath 05/2012, mild by echo 06/2012. 6. Hypokalemia - replete. 7. Diabetes mellitus -  admitting CBG 431. Recent A1C 9.7. Likely continues to be uncontrolled. Continue SSI. Will ask pharmacy to clarify glipizide dose just to make sure 20mg  BID is in fact her home dose. We can resume once no longer NPO. Hold metformin as inpatient. 8. Recently mildly abnormal TSH -  check TSH and free T4 in context of rapid AF.  Signed, Melina Copa PA-C  Seen and examined personally. Agree with above. Troponin 0.36, minimally elevated. Currently CP free. A bit challenging to obtain a clear history.  Debated invasive vs. Non invasive mgt. No ST elevation. I will add Imdur 30mg  PO QD. Track troponin today. If increases significantly or if angina becomes severe proceed with cath (would hold Xarelto tonight). If not, continue with Xarelto and more aggressive antianginal support.  Candee Furbish, MD   ADDENDUM: 9:22am Despite no further CP, ECG is worrisome for ischemia and changed from original. TWI lateral leads. Prior 60% ostial ramus, prior RCA stent.   Since Xarelto 1am this am, hold and will proceed with cath tomorrow.   Candee Furbish, MD

## 2013-03-13 NOTE — Progress Notes (Signed)
ANTICOAGULATION CONSULT NOTE - Initial Consult  Pharmacy Consult for heparin Indication: chest pain/ACS and atrial fibrillation  Allergies  Allergen Reactions  . Peanut-Containing Drug Products Anaphylaxis and Swelling  . Azithromycin     REACTION: pt states INTOL to ZPak  . Flomax [Tamsulosin Hcl] Swelling    swelling  . Pioglitazone     edema  . Pregabalin     REACTION: pt states INTOL to Lyrica  . Sulfonamide Derivatives Swelling    REACTION: SWELLING    Patient Measurements: Height: 5\' 5"  (165.1 cm) Weight: 111 lb 8.8 oz (50.6 kg) IBW/kg (Calculated) : 57  Vital Signs: Temp: 97.9 F (36.6 C) (03/04 0418) Temp src: Oral (03/04 0418) BP: 110/71 mmHg (03/04 0418) Pulse Rate: 83 (03/04 0418)  Labs:  Recent Labs  03/12/13 2101 03/13/13 0400  HGB 13.2 13.0  HCT 38.7 37.4  PLT 159 167  LABPROT 13.8  --   INR 1.08  --   CREATININE 0.70 0.63  TROPONINI <0.30 0.36*    Estimated Creatinine Clearance: 44.8 ml/min (by C-G formula based on Cr of 0.63).   Medical History: Past Medical History  Diagnosis Date  . HTN (hypertension)   . Pure hypercholesterolemia   . Type II or unspecified type diabetes mellitus without mention of complication, not stated as uncontrolled   . Anxiety state, unspecified   . Diaphragmatic hernia without mention of obstruction or gangrene   . Benign neoplasm of colon   . Mixed incontinence urge and stress (female)(female)   . Osteoarthrosis, unspecified whether generalized or localized, unspecified site   . Lumbago   . Osteoporosis, unspecified   . Memory loss   . Renal infarction     01/2011 in setting of low INR  . History of stroke   . AF (atrial fibrillation)     a. Chronic Xarelto  . CAD (coronary artery disease)     a. 05/2012 NSTEMI/Cath/PCI: LM nl, LAD min irregs, LCX min irregs, RI 40-50 ost, OM1 nl, RCA dom, 52m (4.0x12 Vision BMS), PD/PL nl, EF 55%, at least mod MR.  . Mitral regurgitation     a. mild to mod by echo 01/2011;   b. felt to be at least mod by LV gram 05/2012.  [name] Chronic combined systolic and diastolic CHF (congestive heart failure)     a. 01/2011 Echo: EF 40-45%, diff HK, diast dysfxn, mild to mod MR, mod to sev dil LA/RV/RA, mod to sev reduced RV fxn, mod TR, PASP 26mmHg.;  b.  Echo 06/15/12:  EF 55%, mild MR, severe BAE, mod TR, PASP 35  . Hx of cardiovascular stress test     a. Myoview in 2006 was negative for ischemia, EF 54%.    Medications:  Prescriptions prior to admission  Medication Sig Dispense Refill  . acetaminophen (TYLENOL) 500 MG tablet Take 500 mg by mouth every 6 (six) hours as needed.      [name] atenolol (TENORMIN) 50 MG tablet Take 50 mg by mouth 2 (two) times daily.      [name] atorvastatin (LIPITOR) 40 MG tablet Take 1 tablet (40 mg total) by mouth daily at 6 PM.  30 tablet  6  . diltiazem (CARDIZEM CD) 120 MG 24 hr capsule Take 1 capsule (120 mg total) by mouth daily.  30 capsule  0  . furosemide (LASIX) 20 MG tablet Take 1 tablet (20 mg total) by mouth daily.  30 tablet  11  . gabapentin (NEURONTIN) 100 MG capsule Take 1 capsule (100  mg total) by mouth 3 (three) times daily.  90 capsule  5  . glipiZIDE (GLUCOTROL) 10 MG tablet Take 2 tablets by mouth two times daily  120 tablet  6  . metFORMIN (GLUCOPHAGE) 500 MG tablet Take 1,000 mg by mouth 2 (two) times daily with a meal.       . Rivaroxaban (XARELTO) 15 MG TABS tablet Take 15 mg by mouth at bedtime.      . vitamin B-12 (CYANOCOBALAMIN) 1000 MCG tablet Take 1,000 mcg by mouth every morning.        Scheduled:  . aspirin EC  81 mg Oral Daily  . atenolol  50 mg Oral BID  . atorvastatin  40 mg Oral q1800  . diltiazem  120 mg Oral Daily  . gabapentin  100 mg Oral TID  . insulin aspart  0-9 Units Subcutaneous Q4H  . nitroGLYCERIN  1 inch Topical 4 times per day  . sodium chloride  3 mL Intravenous Q12H  . vitamin B-12  1,000 mcg Oral q morning - 10a    Assessment: 80yo female c/o CP and palpitations, noted to be in Afib to 120s, on  Xarelto PTA for Afib, now with rising troponin, to hold Xarelto and begin heparin; last dose of Xarelto 3/4 0126.  Goal of Therapy:  INR 2-3 aPTT 66-102 seconds Monitor platelets by anticoagulation protocol: Yes   Plan:  Will begin heparin gtt at 500 units/hr (previously therapeutic at this rate) starting 24hr after last Xarelto dose and monitor heparin levels, PTT, and CBC.  Wynona Neat, PharmD, BCPS  03/13/2013,5:19 AM

## 2013-03-14 ENCOUNTER — Encounter (HOSPITAL_COMMUNITY)
Admission: EM | Disposition: A | Discharge status: Home / Self Care | Payer: Self-pay | Source: Home / Self Care | Attending: Cardiology

## 2013-03-14 DIAGNOSIS — I200 Unstable angina: Secondary | ICD-10-CM

## 2013-03-14 HISTORY — PX: LEFT HEART CATHETERIZATION WITH CORONARY ANGIOGRAM: SHX5451

## 2013-03-14 LAB — CBC
HEMATOCRIT: 38.5 % (ref 36.0–46.0)
HEMOGLOBIN: 13.3 g/dL (ref 12.0–15.0)
MCH: 30.4 pg (ref 26.0–34.0)
MCHC: 34.5 g/dL (ref 30.0–36.0)
MCV: 88.1 fL (ref 78.0–100.0)
PLATELETS: 178 10*3/uL (ref 150–400)
RBC: 4.37 MIL/uL (ref 3.87–5.11)
RDW: 15.2 % (ref 11.5–15.5)
WBC: 5.5 10*3/uL (ref 4.0–10.5)

## 2013-03-14 LAB — BASIC METABOLIC PANEL
BUN: 13 mg/dL (ref 6–23)
CALCIUM: 9.7 mg/dL (ref 8.4–10.5)
CHLORIDE: 96 meq/L (ref 96–112)
CO2: 30 mEq/L (ref 19–32)
CREATININE: 0.73 mg/dL (ref 0.50–1.10)
GFR calc Af Amer: >90 mL/min (ref >90)
GFR calc non Af Amer: 79 mL/min — ABNORMAL LOW (ref >90)
Glucose, Bld: 142 mg/dL — ABNORMAL HIGH (ref 70–99)
Potassium: 4.5 mEq/L (ref 3.7–5.3)
Sodium: 136 mEq/L — ABNORMAL LOW (ref 137–147)

## 2013-03-14 LAB — GLUCOSE, CAPILLARY
Glucose-Capillary: 108 mg/dL — ABNORMAL HIGH (ref 70–99)
Glucose-Capillary: 152 mg/dL — ABNORMAL HIGH (ref 70–99)
Glucose-Capillary: 110 mg/dL — ABNORMAL HIGH (ref 70–99)
Glucose-Capillary: 124 mg/dL — ABNORMAL HIGH (ref 70–99)
Glucose-Capillary: 142 mg/dL — ABNORMAL HIGH (ref 70–99)
Glucose-Capillary: 334 mg/dL — ABNORMAL HIGH (ref 70–99)

## 2013-03-14 LAB — HEPARIN LEVEL (UNFRACTIONATED): Heparin Unfractionated: 1.58 IU/mL — ABNORMAL HIGH (ref 0.30–0.70)

## 2013-03-14 LAB — APTT: aPTT: 33 seconds (ref 24–37)

## 2013-03-14 LAB — MAGNESIUM: MAGNESIUM: 1.9 mg/dL (ref 1.5–2.5)

## 2013-03-14 SURGERY — LEFT HEART CATHETERIZATION WITH CORONARY ANGIOGRAM
Anesthesia: LOCAL

## 2013-03-14 MED ORDER — FENTANYL CITRATE 0.05 MG/ML IJ SOLN
INTRAMUSCULAR | Status: AC
  Filled 2013-03-14: qty 2

## 2013-03-14 MED ORDER — SODIUM CHLORIDE 0.9 % IV SOLN
INTRAVENOUS | Status: AC
  Administered 2013-03-14: 16:00:00 via INTRAVENOUS

## 2013-03-14 MED ORDER — LIDOCAINE HCL (PF) 1 % IJ SOLN
INTRAMUSCULAR | Status: AC
  Filled 2013-03-14: qty 30

## 2013-03-14 MED ORDER — HEPARIN (PORCINE) IN NACL 100-0.45 UNIT/ML-% IJ SOLN
700.0000 [IU]/h | INTRAMUSCULAR | Status: DC
  Filled 2013-03-14: qty 250

## 2013-03-14 MED ORDER — HEPARIN (PORCINE) IN NACL 2-0.9 UNIT/ML-% IJ SOLN
INTRAMUSCULAR | Status: AC
  Filled 2013-03-14: qty 1500

## 2013-03-14 MED ORDER — INSULIN ASPART 100 UNIT/ML ~~LOC~~ SOLN
0.0000 [IU] | Freq: Three times a day (TID) | SUBCUTANEOUS | Status: DC
  Administered 2013-03-14: 7 [IU] via SUBCUTANEOUS
  Administered 2013-03-15: 2 [IU] via SUBCUTANEOUS
  Administered 2013-03-15: 5 [IU] via SUBCUTANEOUS

## 2013-03-14 MED ORDER — HEPARIN SODIUM (PORCINE) 1000 UNIT/ML IJ SOLN
INTRAMUSCULAR | Status: AC
  Filled 2013-03-14: qty 1

## 2013-03-14 MED ORDER — NITROGLYCERIN 0.2 MG/ML ON CALL CATH LAB
INTRAVENOUS | Status: AC
  Filled 2013-03-14: qty 1

## 2013-03-14 MED ORDER — MIDAZOLAM HCL 2 MG/2ML IJ SOLN
INTRAMUSCULAR | Status: AC
  Filled 2013-03-14: qty 2

## 2013-03-14 MED ORDER — VERAPAMIL HCL 2.5 MG/ML IV SOLN
INTRAVENOUS | Status: AC
  Filled 2013-03-14: qty 2

## 2013-03-14 MED ORDER — OXYCODONE-ACETAMINOPHEN 5-325 MG PO TABS: 1.0000 | ORAL_TABLET | ORAL | Status: DC | PRN

## 2013-03-14 NOTE — Progress Notes (Signed)
ANTICOAGULATION CONSULT NOTE - Follow up  Pharmacy Consult for heparin Indication: chest pain/ACS and atrial fibrillation  Allergies  Allergen Reactions  . Peanut-Containing Drug Products Anaphylaxis and Swelling  . Azithromycin     REACTION: pt states INTOL to ZPak  . Flomax [Tamsulosin Hcl] Swelling    swelling  . Pioglitazone     edema  . Pregabalin     REACTION: pt states INTOL to Lyrica  . Sulfonamide Derivatives Swelling    REACTION: SWELLING    Patient Measurements: Height: 5\' 5"  (165.1 cm) Weight: 110 lb 0.2 oz (49.9 kg) IBW/kg (Calculated) : 57  Vital Signs: Temp: 98.4 F (36.9 C) (03/05 1805) Temp src: Oral (03/05 1805) BP: 108/54 mmHg (03/05 1805) Pulse Rate: 82 (03/05 1805)  Labs:  Recent Labs  03/12/13 2101 03/13/13 0400 03/13/13 0735 03/13/13 0930 03/13/13 1420 03/14/13 0305 03/14/13 0815  HGB 13.2 13.0  --   --   --  13.3  --   HCT 38.7 37.4  --   --   --  38.5  --   PLT 159 167  --   --   --  178  --   APTT  --   --   --   --   --   --  33  LABPROT 13.8  --   --   --   --   --   --   INR 1.08  --   --   --   --   --   --   HEPARINUNFRC  --   --   --   --   --   --  1.58*  CREATININE 0.70 0.63  --   --   --  0.73  --   TROPONINI <0.30 0.36* 0.36* 0.41* 0.50*  --   --     Estimated Creatinine Clearance: 44.2 ml/min (by C-G formula based on Cr of 0.73).   Medical History: Past Medical History  Diagnosis Date  . HTN (hypertension)   . Pure hypercholesterolemia   . Type II or unspecified type diabetes mellitus without mention of complication, not stated as uncontrolled   . Anxiety state, unspecified   . Diaphragmatic hernia without mention of obstruction or gangrene   . Benign neoplasm of colon   . Mixed incontinence urge and stress (female)(female)   . Osteoarthrosis, unspecified whether generalized or localized, unspecified site   . Lumbago   . Osteoporosis, unspecified   . Memory loss   . Renal infarction     01/2011 in setting of low  INR  . History of stroke   . AF (atrial fibrillation)     a. Chronic Xarelto  . CAD (coronary artery disease)     a. 05/2012 NSTEMI/Cath/PCI: LM nl, LAD min irregs, LCX min irregs, RI 40-50 ost, OM1 nl, RCA dom, 62m (4.0x12 Vision BMS), PD/PL nl, EF 55%, at least mod MR.  . Mitral regurgitation     a. mild by echo 06/2012.  [name] Chronic combined systolic and diastolic CHF (congestive heart failure)     a. 01/2011 Echo: EF 40-45%, diff HK, diast dysfxn, mild to mod MR, mod to sev dil LA/RV/RA, mod to sev reduced RV fxn, mod TR, PASP 63mmHg.;  b.  Echo 06/15/12:  EF 55%, mild MR, severe BAE, mod TR, PASP 35  . Hx of cardiovascular stress test     a. Myoview in 2006 was negative for ischemia, EF 54%. Martin Majestic on to have CAD development 05/2012.)  Medications:  Prescriptions prior to admission  Medication Sig Dispense Refill  . atenolol (TENORMIN) 50 MG tablet Take 50 mg by mouth 2 (two) times daily.      [name] atorvastatin (LIPITOR) 40 MG tablet Take 1 tablet (40 mg total) by mouth daily at 6 PM.  30 tablet  6  . diltiazem (CARDIZEM CD) 120 MG 24 hr capsule Take 1 capsule (120 mg total) by mouth daily.  30 capsule  0  . furosemide (LASIX) 20 MG tablet Take 1 tablet (20 mg total) by mouth daily.  30 tablet  11  . gabapentin (NEURONTIN) 100 MG capsule Take 1 capsule (100 mg total) by mouth 3 (three) times daily.  90 capsule  5  . glipiZIDE (GLUCOTROL) 10 MG tablet Take 2 tablets by mouth two times daily  120 tablet  6  . metFORMIN (GLUCOPHAGE) 500 MG tablet Take 1,000 mg by mouth 2 (two) times daily with a meal.       . vitamin B-12 (CYANOCOBALAMIN) 1000 MCG tablet Take 1,000 mcg by mouth every morning.       . [DISCONTINUED] acetaminophen (TYLENOL) 500 MG tablet Take 500 mg by mouth every 6 (six) hours as needed.      . Rivaroxaban (XARELTO) 15 MG TABS tablet Take 15 mg by mouth at bedtime.       Scheduled:  . aspirin EC  81 mg Oral Daily  . atenolol  50 mg Oral BID  . atorvastatin  40 mg Oral q1800  .  diltiazem  120 mg Oral Daily  . gabapentin  100 mg Oral TID  . insulin aspart  0-9 Units Subcutaneous Q4H  . vitamin B-12  1,000 mcg Oral q morning - 10a    Assessment: S/p cardiac cath in this 80yo female. Widely patent coronary arteries and low normal LV function.  The patient was on Xarelto PTA for Afib. To restart IV heparin post cath 8 hours post sheath removal. Sheath out @ 15:45.  No bleeding noted. Last dose of Xarelto 3/4 0126. Heparin level was high but not surprising since recent apixaban. PTT is 33 today so the heparin rate was increased this AM prior to cath.   Goal of Therapy:  Heparin level = 0.3-0.7 PTT = 66-102 Monitor platelets by anticoagulation protocol: Yes   Plan: Restart heparin 8 hours post sheath removed at previous rate of  700 units/hr F/u PTT and heparin level 8 hours post restart of heparin

## 2013-03-14 NOTE — H&P (View-Only) (Signed)
Patient: Michelle Herrera / Admit Date: 03/12/2013 / Date of Encounter: 03/14/2013, 11:48 AM   Subjective  No further pain. Feels well. No complaints.   Objective   Telemetry: Atrial fib controlled rate. Transient HR upper 40's but no sustained bradycardia EKG: AF 66bpm deep TWI v3-V6, less pronounced inferiorly, QTc 488  Physical Exam: Blood pressure 91/60, pulse 77, temperature 97.5 F (36.4 C), temperature source Oral, resp. rate 16, height 5' 5" (1.651 m), weight 110 lb 0.2 oz (49.9 kg), SpO2 96.00%. General: Well developed, well nourished thin WF in no acute distress. Head: Normocephalic, atraumatic, sclera non-icteric, no xanthomas, nares are without discharge. Neck: Negative for carotid bruits. JVP not elevated. Lungs: Clear bilaterally to auscultation without wheezes, rales, or rhonchi. Breathing is unlabored. Heart: Irreg irreg S1 S2 without murmurs, rubs, or gallops.  Abdomen: Soft, non-tender, non-distended with normoactive bowel sounds. No rebound/guarding. Extremities: No clubbing or cyanosis. No edema. Distal pedal pulses are 2+ and equal bilaterally. Neuro: Alert and oriented X 3. Moves all extremities spontaneously. Psych:  Responds to questions appropriately with a normal affect.   Intake/Output Summary (Last 24 hours) at 03/14/13 1148 Last data filed at 03/14/13 0820  Gross per 24 hour  Intake 586.04 ml  Output    450 ml  Net 136.04 ml    Inpatient Medications:  . aspirin  81 mg Oral Pre-Cath  . aspirin EC  81 mg Oral Daily  . atenolol  50 mg Oral BID  . atorvastatin  40 mg Oral q1800  . diltiazem  120 mg Oral Daily  . gabapentin  100 mg Oral TID  . insulin aspart  0-9 Units Subcutaneous Q4H  . isosorbide mononitrate  30 mg Oral Daily  . sodium chloride  3 mL Intravenous Q12H  . vitamin B-12  1,000 mcg Oral q morning - 10a   Infusions:  . sodium chloride 1 mL/kg/hr (03/14/13 0800)  . heparin      Labs:  Recent Labs  03/13/13 0400 03/14/13 0305    NA 137 136*  K 3.4* 4.5  CL 97 96  CO2 25 30  GLUCOSE 119* 142*  BUN 11 13  CREATININE 0.63 0.73  CALCIUM 9.9 9.7  MG 1.5 1.9   No results found for this basename: AST, ALT, ALKPHOS, BILITOT, PROT, ALBUMIN,  in the last 72 hours  Recent Labs  03/13/13 0400 03/14/13 0305  WBC 5.4 5.5  HGB 13.0 13.3  HCT 37.4 38.5  MCV 86.6 88.1  PLT 167 178    Recent Labs  03/13/13 0400 03/13/13 0735 03/13/13 0930 03/13/13 1420  TROPONINI 0.36* 0.36* 0.41* 0.50*   No components found with this basename: POCBNP,  No results found for this basename: HGBA1C,  in the last 72 hours   Radiology/Studies:  Dg Chest 2 View  03/12/2013   CLINICAL DATA:  Palpitations.  EXAM: CHEST  2 VIEW  COMPARISON:  01/20/2013  FINDINGS: Chronic cardiomegaly. No edema or consolidation. There are small bilateral pleural effusions. Based on density, rounded nodular opacity at the right base is likely related to ossification of the the sixth costochondral junction, also seen on previous imaging (especially 04/16/2012). Mild atelectasis or scar in the right mid lung.  IMPRESSION: Trace bilateral pleural effusions.  No edema or consolidation.   Electronically Signed   By: Jonathan  Watts M.D.   On: 03/12/2013 21:36     Assessment and Plan  1. Permanent atrial fibrillation, admitted with RVR: has failed Tikosyn and amiodarone therapy. Is on   anticoagulation as outpatient (h/o renal infarct 01/2011). Trigger for rapid rates unclear, ? Hyperglycemia. Now controlled on home regimen. Normal thyroid function 3/4. BP a little soft early AM. Will add hold parameters to meds. 2. CAD with NSTEMI this admission. (History = NSTEMI/BMS to RCA 05/2012 tx with Plavix +Xarelto for 30 days then ASA/Xarelto only until ASA was discontinued 11/2012 due to epistaxis) EKG concerning for ischemia this admission. Plan is for cardiac cath today. Risks and benefits of cardiac catheterization have been discussed with the patient.  These include  bleeding, infection, kidney damage, stroke, heart attack, death.  The patient understands these risks and is willing to proceed. 3. H/o epistaxis, not on ASA as outpatient due to this - s/p electrocautery in 11/2012. stable. Denies recent bleeding. Hopefully won't be an issue in the future. 4. Chronic combined systolic and diastolic CHF EF 87% 05/6431, compensated. Will likely resume Lasix post-cath. 5. Mitral regurgitation - mod by cath 05/2012, mild by echo 06/2012.  6. Hypokalemia/hypomagnesemia - repleted. QT prolongation improved. Recheck labs in AM. If goes home on Lasix would add daily KCl. If Mg falls, will add scheduled MagOx. 7. Diabetes mellitus - admitting CBG 431. Recent A1C 9.7. Likely continues to be uncontrolled. Continue SSI. Resume glipizide when no longer NPO. Hold metformin as inpatient.   Signed, Melina Copa PA-C Personally seen and examined and agree with above.  NSTEMI - low level troponin similar to prior NSTMI when stent was placed. -Cath  AFIB  - stable  - last dose Xarelto 3/4 at [facility], [facility]

## 2013-03-14 NOTE — Interval H&P Note (Signed)
History and Physical Interval Note:  03/14/2013 3:06 PM Elderly female with dementia who has positive cardiac markers in the setting of atrial fibrillation and evolving EKG with deeply inverted T waves anteriorly and laterally. Findings could represent Takotsubo stress cardiomyopathy. The patient has a history of prior right coronary bare metal stenting in a similar situation 10 months ago. She had nasal bleeding on aspirin and apixiban requiring aspirin discontinuation.Cath Lab Visit (complete for each Cath Lab visit)  Clinical Evaluation Leading to the Procedure:   ACS: yes  Non-ACS:    Anginal Classification: CCS II  Anti-ischemic medical therapy: Minimal Therapy (1 class of medications)  Non-Invasive Test Results: No non-invasive testing performed  Prior CABG: No previous CABG       Michelle Herrera  has presented today for surgery, with the diagnosis of cp  The various methods of treatment have been discussed with the patient and family. After consideration of risks, benefits and other options for treatment, the patient has consented to  Procedure(s): LEFT HEART CATHETERIZATION WITH CORONARY ANGIOGRAM (N/A) as a surgical intervention .  The patient's history has been reviewed, patient examined, no change in status, stable for surgery.  I have reviewed the patient's chart and labs.  Questions were answered to the patient's satisfaction.     Sinclair Grooms

## 2013-03-14 NOTE — Clinical Documentation Improvement (Signed)
Possible Clinical Conditions?   Non-STEMI STEMI   Demand Ischemia                              Other Condition                 Cannot Clinically Determine   Supporting Information: Risk Factors:( As per history) "She has a hx of permanent AFib, combined systolic and diastolic CHF, DM2, HTN, HL. She has failed Tikosyn and amiodarone therapy.Myoview in 2006 was negative for ischemia, EF 54%.Admitted 05/2012 for a non-STEMI with angioplasty.  Troponin Levels: Elevation from <0.30 on 3-3  up to 0.50 on 3-4  Thank You, Alessandra Grout, RN, BSN, CCDS, Clinical Documentation Specialist:  [phone]   Cell= [phone] [place] [employer] [facility]

## 2013-03-14 NOTE — Progress Notes (Signed)
ANTICOAGULATION CONSULT NOTE - Initial Consult  Pharmacy Consult for heparin Indication: chest pain/ACS and atrial fibrillation  Allergies  Allergen Reactions  . Peanut-Containing Drug Products Anaphylaxis and Swelling  . Azithromycin     REACTION: pt states INTOL to ZPak  . Flomax [Tamsulosin Hcl] Swelling    swelling  . Pioglitazone     edema  . Pregabalin     REACTION: pt states INTOL to Lyrica  . Sulfonamide Derivatives Swelling    REACTION: SWELLING    Patient Measurements: Height: 5\' 5"  (165.1 cm) Weight: 110 lb 0.2 oz (49.9 kg) IBW/kg (Calculated) : 57  Vital Signs: Temp: 97.5 F (36.4 C) (03/05 0446) Temp src: Oral (03/05 0446) BP: 91/60 mmHg (03/05 0446) Pulse Rate: 77 (03/05 0446)  Labs:  Recent Labs  03/12/13 2101 03/13/13 0400 03/13/13 0735 03/13/13 0930 03/13/13 1420 03/14/13 0305 03/14/13 0815  HGB 13.2 13.0  --   --   --  13.3  --   HCT 38.7 37.4  --   --   --  38.5  --   PLT 159 167  --   --   --  178  --   APTT  --   --   --   --   --   --  33  LABPROT 13.8  --   --   --   --   --   --   INR 1.08  --   --   --   --   --   --   HEPARINUNFRC  --   --   --   --   --   --  1.58*  CREATININE 0.70 0.63  --   --   --  0.73  --   TROPONINI <0.30 0.36* 0.36* 0.41* 0.50*  --   --     Estimated Creatinine Clearance: 44.2 ml/min (by C-G formula based on Cr of 0.73).   Medical History: Past Medical History  Diagnosis Date  . HTN (hypertension)   . Pure hypercholesterolemia   . Type II or unspecified type diabetes mellitus without mention of complication, not stated as uncontrolled   . Anxiety state, unspecified   . Diaphragmatic hernia without mention of obstruction or gangrene   . Benign neoplasm of colon   . Mixed incontinence urge and stress (female)(female)   . Osteoarthrosis, unspecified whether generalized or localized, unspecified site   . Lumbago   . Osteoporosis, unspecified   . Memory loss   . Renal infarction     01/2011 in setting  of low INR  . History of stroke   . AF (atrial fibrillation)     a. Chronic Xarelto  . CAD (coronary artery disease)     a. 05/2012 NSTEMI/Cath/PCI: LM nl, LAD min irregs, LCX min irregs, RI 40-50 ost, OM1 nl, RCA dom, 35m (4.0x12 Vision BMS), PD/PL nl, EF 55%, at least mod MR.  . Mitral regurgitation     a. mild by echo 06/2012.  [name] Chronic combined systolic and diastolic CHF (congestive heart failure)     a. 01/2011 Echo: EF 40-45%, diff HK, diast dysfxn, mild to mod MR, mod to sev dil LA/RV/RA, mod to sev reduced RV fxn, mod TR, PASP 44mmHg.;  b.  Echo 06/15/12:  EF 55%, mild MR, severe BAE, mod TR, PASP 35  . Hx of cardiovascular stress test     a. Myoview in 2006 was negative for ischemia, EF 54%. Martin Majestic on to have CAD development 05/2012.)  Medications:  Prescriptions prior to admission  Medication Sig Dispense Refill  . acetaminophen (TYLENOL) 500 MG tablet Take 500 mg by mouth every 6 (six) hours as needed.      [name] atenolol (TENORMIN) 50 MG tablet Take 50 mg by mouth 2 (two) times daily.      [name] atorvastatin (LIPITOR) 40 MG tablet Take 1 tablet (40 mg total) by mouth daily at 6 PM.  30 tablet  6  . diltiazem (CARDIZEM CD) 120 MG 24 hr capsule Take 1 capsule (120 mg total) by mouth daily.  30 capsule  0  . furosemide (LASIX) 20 MG tablet Take 1 tablet (20 mg total) by mouth daily.  30 tablet  11  . gabapentin (NEURONTIN) 100 MG capsule Take 1 capsule (100 mg total) by mouth 3 (three) times daily.  90 capsule  5  . glipiZIDE (GLUCOTROL) 10 MG tablet Take 2 tablets by mouth two times daily  120 tablet  6  . metFORMIN (GLUCOPHAGE) 500 MG tablet Take 1,000 mg by mouth 2 (two) times daily with a meal.       . vitamin B-12 (CYANOCOBALAMIN) 1000 MCG tablet Take 1,000 mcg by mouth every morning.       . Rivaroxaban (XARELTO) 15 MG TABS tablet Take 15 mg by mouth at bedtime.       Scheduled:  . aspirin  81 mg Oral Pre-Cath  . aspirin EC  81 mg Oral Daily  . atenolol  50 mg Oral BID  .  atorvastatin  40 mg Oral q1800  . diltiazem  120 mg Oral Daily  . gabapentin  100 mg Oral TID  . insulin aspart  0-9 Units Subcutaneous Q4H  . isosorbide mononitrate  30 mg Oral Daily  . sodium chloride  3 mL Intravenous Q12H  . vitamin B-12  1,000 mcg Oral q morning - 10a    Assessment: 80yo female c/o CP and palpitations, noted to be in Afib to 120s, on Xarelto PTA for Afib, now with rising troponin, to hold Xarelto and begin heparin; last dose of Xarelto 3/4 0126. Heparin level is high but not surprising since recent apixaban. PTT is 33 today so will increase the rate  Goal of Therapy:  Heparin level = 0.3-0.7 PTT = 66-102 Monitor platelets by anticoagulation protocol: Yes   Plan:   Increase heparin to 700 units/hr F/u PTT if not cath before then

## 2013-03-14 NOTE — Progress Notes (Signed)
Patient: Michelle Herrera / Admit Date: 03/12/2013 / Date of Encounter: 03/14/2013, 11:48 AM   Subjective  No further pain. Feels well. No complaints.   Objective   Telemetry: Atrial fib controlled rate. Transient HR upper 40's but no sustained bradycardia EKG: AF 66bpm deep TWI v3-V6, less pronounced inferiorly, QTc 488  Physical Exam: Blood pressure 91/60, pulse 77, temperature 97.5 F (36.4 C), temperature source Oral, resp. rate 16, height 5\' 5"  (1.651 m), weight 110 lb 0.2 oz (49.9 kg), SpO2 96.00%. General: Well developed, well nourished thin WF in no acute distress. Head: Normocephalic, atraumatic, sclera non-icteric, no xanthomas, nares are without discharge. Neck: Negative for carotid bruits. JVP not elevated. Lungs: Clear bilaterally to auscultation without wheezes, rales, or rhonchi. Breathing is unlabored. Heart: Irreg irreg S1 S2 without murmurs, rubs, or gallops.  Abdomen: Soft, non-tender, non-distended with normoactive bowel sounds. No rebound/guarding. Extremities: No clubbing or cyanosis. No edema. Distal pedal pulses are 2+ and equal bilaterally. Neuro: Alert and oriented X 3. Moves all extremities spontaneously. Psych:  Responds to questions appropriately with a normal affect.   Intake/Output Summary (Last 24 hours) at 03/14/13 1148 Last data filed at 03/14/13 0820  Gross per 24 hour  Intake 586.04 ml  Output    450 ml  Net 136.04 ml    Inpatient Medications:  . aspirin  81 mg Oral Pre-Cath  . aspirin EC  81 mg Oral Daily  . atenolol  50 mg Oral BID  . atorvastatin  40 mg Oral q1800  . diltiazem  120 mg Oral Daily  . gabapentin  100 mg Oral TID  . insulin aspart  0-9 Units Subcutaneous Q4H  . isosorbide mononitrate  30 mg Oral Daily  . sodium chloride  3 mL Intravenous Q12H  . vitamin B-12  1,000 mcg Oral q morning - 10a   Infusions:  . sodium chloride 1 mL/kg/hr (03/14/13 0800)  . heparin      Labs:  Recent Labs  03/13/13 0400 03/14/13 0305    NA 137 136*  K 3.4* 4.5  CL 97 96  CO2 25 30  GLUCOSE 119* 142*  BUN 11 13  CREATININE 0.63 0.73  CALCIUM 9.9 9.7  MG 1.5 1.9   No results found for this basename: AST, ALT, ALKPHOS, BILITOT, PROT, ALBUMIN,  in the last 72 hours  Recent Labs  03/13/13 0400 03/14/13 0305  WBC 5.4 5.5  HGB 13.0 13.3  HCT 37.4 38.5  MCV 86.6 88.1  PLT 167 178    Recent Labs  03/13/13 0400 03/13/13 0735 03/13/13 0930 03/13/13 1420  TROPONINI 0.36* 0.36* 0.41* 0.50*   No components found with this basename: POCBNP,  No results found for this basename: HGBA1C,  in the last 72 hours   Radiology/Studies:  Dg Chest 2 View  03/12/2013   CLINICAL DATA:  Palpitations.  EXAM: CHEST  2 VIEW  COMPARISON:  01/20/2013  FINDINGS: Chronic cardiomegaly. No edema or consolidation. There are small bilateral pleural effusions. Based on density, rounded nodular opacity at the right base is likely related to ossification of the the sixth costochondral junction, also seen on previous imaging (especially 04/16/2012). Mild atelectasis or scar in the right mid lung.  IMPRESSION: Trace bilateral pleural effusions.  No edema or consolidation.   Electronically Signed   By: Jorje Guild M.D.   On: 03/12/2013 21:36     Assessment and Plan  1. Permanent atrial fibrillation, admitted with RVR: has failed Tikosyn and amiodarone therapy. Is on  anticoagulation as outpatient (h/o renal infarct 01/2011). Trigger for rapid rates unclear, ? Hyperglycemia. Now controlled on home regimen. Normal thyroid function 3/4. BP a little soft early AM. Will add hold parameters to meds. 2. CAD with NSTEMI this admission. (History = NSTEMI/BMS to RCA 05/2012 tx with Plavix +Xarelto for 30 days then ASA/Xarelto only until ASA was discontinued 11/2012 due to epistaxis) EKG concerning for ischemia this admission. Plan is for cardiac cath today. Risks and benefits of cardiac catheterization have been discussed with the patient.  These include  bleeding, infection, kidney damage, stroke, heart attack, death.  The patient understands these risks and is willing to proceed. 3. H/o epistaxis, not on ASA as outpatient due to this - s/p electrocautery in 11/2012. stable. Denies recent bleeding. Hopefully won't be an issue in the future. 4. Chronic combined systolic and diastolic CHF EF 87% 05/6431, compensated. Will likely resume Lasix post-cath. 5. Mitral regurgitation - mod by cath 05/2012, mild by echo 06/2012.  6. Hypokalemia/hypomagnesemia - repleted. QT prolongation improved. Recheck labs in AM. If goes home on Lasix would add daily KCl. If Mg falls, will add scheduled MagOx. 7. Diabetes mellitus - admitting CBG 431. Recent A1C 9.7. Likely continues to be uncontrolled. Continue SSI. Resume glipizide when no longer NPO. Hold metformin as inpatient.   Signed, Melina Copa PA-C Personally seen and examined and agree with above.  NSTEMI - low level troponin similar to prior NSTMI when stent was placed. -Cath  AFIB  - stable  - last dose Xarelto 3/4 at [facility], [facility]

## 2013-03-14 NOTE — CV Procedure (Signed)
     Left Heart Catheterization with Coronary Angiography  Report  BRITTIE WHISNANT  80 y.o.  female [birth year]  Procedure Date: 03/14/2013  Referring Physician: Edythe Clarity, M.D. Primary Cardiologist:: Quay Burow, M.D.  INDICATIONS: Non-ST elevation MI, type I versus type II  PROCEDURE: 1. Left heart catheterization; 2. Coronary angiography; 3. Left ventriculography  CONSENT:  The risks, benefits, and details of the procedure were explained in detail to the patient. Risks including death, stroke, heart attack, kidney injury, allergy, limb ischemia, bleeding and radiation injury were discussed.  The patient verbalized understanding and wanted to proceed.  Informed written consent was obtained.  PROCEDURE TECHNIQUE:  After Xylocaine anesthesia a 5 French Slender sheath was placed in the right radial artery with a single anterior/posterior wall stick utilizing an Angiocath and the Seldinger technique.  Coronary angiography was done using a 5 [place] JR 4 and JL 3.5 cm diagnostic catheter.  Left ventriculography was done using the 5 [place] JR 4 catheter and hand injection.   The patient received 2500 units of IV heparin once the guidewire into the ascending aorta.  The case was terminated and a wrist band was applied to achieve hemostasis.  MEDICATIONS: Versed 1 mg and fentanyl 25 mcg IV   CONTRAST:  Total of 70 cc.  COMPLICATIONS:  None   HEMODYNAMICS:  Aortic pressure 93/50 mmHg; LV pressure 90/4 mmHg; LVEDP 8 mm mercury  ANGIOGRAPHIC DATA:   The left main coronary artery is widely patent.  The left anterior descending artery is widely patent.  The left circumflex artery is proximal luminal irregularities. The vessel is overall widely patent.  The right coronary artery is widely patent. The stent in the mid vessel is without evidence of ISR.   LEFT VENTRICULOGRAM:  Left ventricular angiogram was done in the 30 RAO projection and revealed normal LV cavity size. Mild  inferior wall hypokinesis. Ejection fraction is 50%.   IMPRESSIONS:  1. Widely patent coronary arteries.  2. Low normal LV function with mild inferior wall hypokinesis   RECOMMENDATION:  Per the primary team. Heparin can probably be discontinued and Xarelto resumed.[name]

## 2013-03-15 ENCOUNTER — Encounter (HOSPITAL_COMMUNITY): Payer: Self-pay | Admitting: Physician Assistant

## 2013-03-15 ENCOUNTER — Other Ambulatory Visit: Payer: Self-pay | Admitting: Physician Assistant

## 2013-03-15 ENCOUNTER — Other Ambulatory Visit: Payer: Self-pay

## 2013-03-15 ENCOUNTER — Telehealth: Payer: Self-pay | Admitting: Internal Medicine

## 2013-03-15 DIAGNOSIS — E876 Hypokalemia: Secondary | ICD-10-CM

## 2013-03-15 LAB — CBC
HEMATOCRIT: 34.8 % — AB (ref 36.0–46.0)
HEMOGLOBIN: 11.9 g/dL — AB (ref 12.0–15.0)
MCH: 30.2 pg (ref 26.0–34.0)
MCHC: 34.2 g/dL (ref 30.0–36.0)
MCV: 88.3 fL (ref 78.0–100.0)
PLATELETS: 180 10*3/uL (ref 150–400)
RBC: 3.94 MIL/uL (ref 3.87–5.11)
RDW: 15.5 % (ref 11.5–15.5)
WBC: 5 10*3/uL (ref 4.0–10.5)

## 2013-03-15 LAB — BASIC METABOLIC PANEL
BUN: 11 mg/dL (ref 6–23)
CHLORIDE: 100 meq/L (ref 96–112)
CO2: 24 meq/L (ref 19–32)
CREATININE: 0.6 mg/dL (ref 0.50–1.10)
Calcium: 9.3 mg/dL (ref 8.4–10.5)
GFR calc Af Amer: >90 mL/min (ref >90)
GFR calc non Af Amer: 84 mL/min — ABNORMAL LOW (ref >90)
GLUCOSE: 155 mg/dL — AB (ref 70–99)
POTASSIUM: 4.4 meq/L (ref 3.7–5.3)
Sodium: 135 mEq/L — ABNORMAL LOW (ref 137–147)

## 2013-03-15 LAB — APTT: aPTT: 65 seconds — ABNORMAL HIGH (ref 24–37)

## 2013-03-15 LAB — GLUCOSE, CAPILLARY
GLUCOSE-CAPILLARY: 194 mg/dL — AB (ref 70–99)
Glucose-Capillary: 164 mg/dL — ABNORMAL HIGH (ref 70–99)
Glucose-Capillary: 256 mg/dL — ABNORMAL HIGH (ref 70–99)
Glucose-Capillary: 257 mg/dL — ABNORMAL HIGH (ref 70–99)

## 2013-03-15 LAB — HEPARIN LEVEL (UNFRACTIONATED): Heparin Unfractionated: 0.87 IU/mL — ABNORMAL HIGH (ref 0.30–0.70)

## 2013-03-15 LAB — MAGNESIUM: Magnesium: 1.6 mg/dL (ref 1.5–2.5)

## 2013-03-15 MED ORDER — NITROGLYCERIN 0.4 MG SL SUBL: 0.4000 mg | SUBLINGUAL_TABLET | SUBLINGUAL | Status: AC | PRN

## 2013-03-15 MED ORDER — MAGNESIUM OXIDE 400 (241.3 MG) MG PO TABS
400.0000 mg | ORAL_TABLET | Freq: Every day | ORAL | Status: DC
  Administered 2013-03-15: 400 mg via ORAL
  Filled 2013-03-15: qty 1

## 2013-03-15 MED ORDER — ACETAMINOPHEN 500 MG PO TABS
500.0000 mg | ORAL_TABLET | Freq: Four times a day (QID) | ORAL | Status: DC | PRN
  Administered 2013-03-15: 500 mg via ORAL
  Filled 2013-03-15: qty 1

## 2013-03-15 MED ORDER — ACETAMINOPHEN 500 MG PO TABS
500.0000 mg | ORAL_TABLET | Freq: Four times a day (QID) | ORAL | Status: DC | PRN
Start: 2013-03-15 — End: 2013-09-05

## 2013-03-15 MED ORDER — DILTIAZEM HCL ER COATED BEADS 120 MG PO CP24: 120.0000 mg | ORAL_CAPSULE | Freq: Every day | ORAL | Status: DC

## 2013-03-15 MED ORDER — HEPARIN (PORCINE) IN NACL 100-0.45 UNIT/ML-% IJ SOLN
750.0000 [IU]/h | INTRAMUSCULAR | Status: DC
  Administered 2013-03-15: 750 [IU]/h via INTRAVENOUS
  Filled 2013-03-15: qty 250

## 2013-03-15 MED ORDER — METFORMIN HCL 500 MG PO TABS: 1000.0000 mg | ORAL_TABLET | Freq: Two times a day (BID) | ORAL | Status: DC

## 2013-03-15 MED ORDER — FUROSEMIDE 20 MG PO TABS
20.0000 mg | ORAL_TABLET | Freq: Every day | ORAL | Status: DC
  Administered 2013-03-15: 20 mg via ORAL
  Filled 2013-03-15: qty 1

## 2013-03-15 MED ORDER — MAGNESIUM OXIDE 400 (241.3 MG) MG PO TABS: 400.0000 mg | ORAL_TABLET | Freq: Every day | ORAL | Status: DC

## 2013-03-15 NOTE — Discharge Summary (Signed)
Personally seen and examined, agree with above. Reassuring cardiac catheterization. Continue to treat atrial fibrillation, anticoagulation.

## 2013-03-15 NOTE — Telephone Encounter (Signed)
New message    7 days TCM per dayan pa.  appt on  3/12

## 2013-03-15 NOTE — Progress Notes (Signed)
ANTICOAGULATION CONSULT NOTE - Follow up  Pharmacy Consult for heparin Indication: chest pain/ACS and atrial fibrillation  Allergies  Allergen Reactions  . Peanut-Containing Drug Products Anaphylaxis and Swelling  . Azithromycin     REACTION: pt states INTOL to ZPak  . Flomax [Tamsulosin Hcl] Swelling    swelling  . Pioglitazone     edema  . Pregabalin     REACTION: pt states INTOL to Lyrica  . Sulfonamide Derivatives Swelling    REACTION: SWELLING    Patient Measurements: Height: 5\' 5"  (165.1 cm) Weight: 112 lb 3.4 oz (50.9 kg) IBW/kg (Calculated) : 57  Vital Signs: Temp: 97.6 F (36.4 C) (03/06 0356) Temp src: Oral (03/06 0356) BP: 117/76 mmHg (03/06 0356) Pulse Rate: 86 (03/06 0356)  Labs:  Recent Labs  03/12/13 2101 03/13/13 0400 03/13/13 0735 03/13/13 0930 03/13/13 1420 03/14/13 0305 03/14/13 0815 03/15/13 0430 03/15/13 0804  HGB 13.2 13.0  --   --   --  13.3  --  11.9*  --   HCT 38.7 37.4  --   --   --  38.5  --  34.8*  --   PLT 159 167  --   --   --  178  --  180  --   APTT  --   --   --   --   --   --  33  --  65*  LABPROT 13.8  --   --   --   --   --   --   --   --   INR 1.08  --   --   --   --   --   --   --   --   HEPARINUNFRC  --   --   --   --   --   --  1.58*  --  0.87*  CREATININE 0.70 0.63  --   --   --  0.73  --  0.60  --   TROPONINI <0.30 0.36* 0.36* 0.41* 0.50*  --   --   --   --     Estimated Creatinine Clearance: 45.1 ml/min (by C-G formula based on Cr of 0.6).   Medical History: Past Medical History  Diagnosis Date  . HTN (hypertension)   . Pure hypercholesterolemia   . Type II or unspecified type diabetes mellitus without mention of complication, not stated as uncontrolled   . Anxiety state, unspecified   . Diaphragmatic hernia without mention of obstruction or gangrene   . Benign neoplasm of colon   . Mixed incontinence urge and stress (female)(female)   . Osteoarthrosis, unspecified whether generalized or localized,  unspecified site   . Lumbago   . Osteoporosis, unspecified   . Memory loss   . Renal infarction     01/2011 in setting of low INR  . History of stroke   . AF (atrial fibrillation)     a. Chronic Xarelto  . CAD (coronary artery disease)     a. 05/2012 NSTEMI/Cath/PCI: LM nl, LAD min irregs, LCX min irregs, RI 40-50 ost, OM1 nl, RCA dom, 63m (4.0x12 Vision BMS), PD/PL nl, EF 55%, at least mod MR.  . Mitral regurgitation     a. mild by echo 06/2012.  [name] Chronic combined systolic and diastolic CHF (congestive heart failure)     a. 01/2011 Echo: EF 40-45%, diff HK, diast dysfxn, mild to mod MR, mod to sev dil LA/RV/RA, mod to sev reduced RV fxn, mod TR,  PASP 54mmHg.;  b.  Echo 06/15/12:  EF 55%, mild MR, severe BAE, mod TR, PASP 35  . Hx of cardiovascular stress test     a. Myoview in 2006 was negative for ischemia, EF 54%. Martin Majestic on to have CAD development 05/2012.)    Medications:  Prescriptions prior to admission  Medication Sig Dispense Refill  . atenolol (TENORMIN) 50 MG tablet Take 50 mg by mouth 2 (two) times daily.      [name] atorvastatin (LIPITOR) 40 MG tablet Take 1 tablet (40 mg total) by mouth daily at 6 PM.  30 tablet  6  . diltiazem (CARDIZEM CD) 120 MG 24 hr capsule Take 1 capsule (120 mg total) by mouth daily.  30 capsule  0  . furosemide (LASIX) 20 MG tablet Take 1 tablet (20 mg total) by mouth daily.  30 tablet  11  . gabapentin (NEURONTIN) 100 MG capsule Take 1 capsule (100 mg total) by mouth 3 (three) times daily.  90 capsule  5  . glipiZIDE (GLUCOTROL) 10 MG tablet Take 2 tablets by mouth two times daily  120 tablet  6  . metFORMIN (GLUCOPHAGE) 500 MG tablet Take 1,000 mg by mouth 2 (two) times daily with a meal.       . vitamin B-12 (CYANOCOBALAMIN) 1000 MCG tablet Take 1,000 mcg by mouth every morning.       . [DISCONTINUED] acetaminophen (TYLENOL) 500 MG tablet Take 500 mg by mouth every 6 (six) hours as needed.      . Rivaroxaban (XARELTO) 15 MG TABS tablet Take 15 mg by mouth  at bedtime.       Scheduled:  . aspirin EC  81 mg Oral Daily  . atenolol  50 mg Oral BID  . atorvastatin  40 mg Oral q1800  . diltiazem  120 mg Oral Daily  . gabapentin  100 mg Oral TID  . insulin aspart  0-9 Units Subcutaneous TID WC & HS  . vitamin B-12  1,000 mcg Oral q morning - 10a    Assessment: S/p cardiac cath in this 80yo female. Widely patent coronary arteries and low normal LV function.  The patient was on Xarelto PTA for Afib. Heparin level is still slightly elevated due to xarelto. PTT is only slightly low this AM. Per cath note, likely to resume xarelto so will only increase heparin slightly for now.  Goal of Therapy:  Heparin level = 0.3-0.7 PTT = 66-102 Monitor platelets by anticoagulation protocol: Yes   Plan:  Increase heparin to 750 units/hr F/u with resuming of xarelto

## 2013-03-15 NOTE — Discharge Summary (Signed)
Discharge Summary   Patient ID: Michelle Herrera MRN: [id number], DOB/AGE: [birth year] 80 y.o. Admit date: 03/12/2013 D/C date:     03/15/2013  Primary Care Provider: Noralee Space, MD Primary Cardiologist: Lovena Le  Primary Discharge Diagnoses:  1. Permanent atrial fibrillation, admitted with RVR - history: has failed Tikosyn and amiodarone therapy, is on anticoagulation as outpatient (h/o renal infarct 01/2011) 2. Chest pain, likely due to rapid atrial fibrillation 3. CAD  - NSTEMI this admission - cath 03/14/13 with widely patent coronaries, suspect demand ischemia in setting of RVR - history: NSTEMI/BMS to RCA 05/2012 tx with Plavix +Xarelto for 30 days then ASA/Xarelto only until ASA was discontinued 11/2012 due to epistaxis 4. Chronic combined systolic and diastolic CHF EF [id number], EF 50% by cath 03/14/13 5. Mitral regurgitation - mod by cath 05/2012, mild by echo 06/2012 6. Hypokalemia 7. Hypomagnesemia, started on MagOx 8. Diabetes mellitus, uncontrolled, recent A1C 9.7  Secondary Discharge Diagnoses:  1. HTN 2. Hypercholesterolemia 3. Anxiety 4. Diaphragmatic hernia without mention of obstruction or gangrene 5. Benign neoplasm of colon  6. Mixed incontinence urge and stress  7. Osteoarthrosis, unspecified whether generalized or localized, unspecified site  8. Lumbago  9. Osteoporosis, unspecified  10. Memory loss 11. H/o renal infarction 01/2011 in setting of low INR 12. History of stroke 79. H/o epistaxis, not on ASA as outpatient due to this, s/p electrocautery in 11/2012  Hospital Course: [name] is an 80 y/o F with history of permanent AFib, CAD, combined systolic and diastolic CHF, DM2, HTN, HL who presented to [facility] with palpitations and chest pain. She has failed Tikosyn and amiodarone therapy in the past. She was admitted 05/2012 for a non-STEMI and underwent BMS to [facility]. She also has history of mod MR by cath at that time with f/u echo 06/2012 showing EF  55% with mild MR. She was to remain on Plavix and Xarelto for 30 days, then stay on ASA and Xarelto. Aspirin was later held in 11/2012 due to epistaxis for which she received electrocautery.   She presented to the ER 03/12/2013 with tachypalpitations and chest discomfort beginning that evening. She had SOB. Symptoms progressed so her son took her to [place]. There she was found to be in atrial fib with RVR. She was given NTG and transferred to [facility] for further evaluation. EKG showed afib 117bpm with no ST-T changes. By the time she had transferred to [place] was improved and thus home med regimen was continued. She did not appear to be significantly volume overloaded but pBNP was elevated at 3000 possibly secondary to AF RVR thus was given a dose of 20mg  IV Lasix. She ruled in for an NSTEMI with borderline elevated troponins of 0.36-0.36-0.41-0.50. Follow-up EKG on the morning of 3/4 showed worsening TWI in the lateral leads and QT prolongation. She had hypomagnesemia with Mg of 1.5 thus this was repleted with IV magnesium sulfate along with potassium for hypokalemia. Xarelto was held in prep for cath with transition to heparin. TSH and free T4 were normal. QT prolongation improved after lyte repletion. She underwent cath 03/14/13 demonstrating: 1. Widely patent coronary arteries.  2. Low normal LV function with mild inferior wall hypokinesis; Ejection fraction is 50%. She had had no further chest pain. Home lasix dose was resumed. She was initially placed on Imdur this admission but this was later discontinued due to lack of obstructive CAD. We will continue home regimen of diltiazem and atenolol as this seems  to be working well for her. She has not had any further high rate episodes. Xarelto will be resumed this evening. Dr. Marlou Porch has seen and examined the patient today and feels she is stable for discharge.  Other issues to note this admission: - will start MagOx as Mag was again 1.6 today. Will  not start scheduled KCl for now as admitting potassium was 4.8 (suspect drop during adm was due to the dose of IV Lasix she had received) and is 4.4 today. Recheck BMET and Mg at next OV. - f/u 1 week TOC visit. - will restart Metformin 48 hr post cath - pt instructed to f/u PCP for her DM as admission CBG was 431 and recent A1c 9.7 in January.   Discharge Vitals: Blood pressure 117/76, pulse 86, temperature 97.6 F (36.4 C), temperature source Oral, resp. rate 18, height 5\' 5"  (1.651 m), weight 112 lb 3.4 oz (50.9 kg), SpO2 95.00%.  Labs: Lab Results  Component Value Date   WBC 5.0 03/15/2013   HGB 11.9* 03/15/2013   HCT 34.8* 03/15/2013   MCV 88.3 03/15/2013   PLT 180 03/15/2013    Recent Labs Lab 03/15/13 0430  NA 135*  K 4.4  CL 100  CO2 24  BUN 11  CREATININE 0.60  CALCIUM 9.3  GLUCOSE 155*    Recent Labs  03/13/13 0400 03/13/13 0735 03/13/13 0930 03/13/13 1420  TROPONINI 0.36* 0.36* 0.41* 0.50*   Lab Results  Component Value Date   CHOL 138 10/18/2012   HDL 64.60 10/18/2012   LDLCALC 61 10/18/2012   TRIG 63.0 10/18/2012   Diagnostic Studies/Procedures   Dg Chest 2 View 03/12/2013   CLINICAL DATA:  Palpitations.  EXAM: CHEST  2 VIEW  COMPARISON:  01/20/2013  FINDINGS: Chronic cardiomegaly. No edema or consolidation. There are small bilateral pleural effusions. Based on density, rounded nodular opacity at the right base is likely related to ossification of the the sixth costochondral junction, also seen on previous imaging (especially 04/16/2012). Mild atelectasis or scar in the right mid lung.  IMPRESSION: Trace bilateral pleural effusions.  No edema or consolidation.   Electronically Signed   By: Jorje Guild M.D.   On: 03/12/2013 21:36   Cardiac Cath 03/14/13 Procedure Date: 03/14/2013  Referring Physician: Edythe Clarity, M.D.  Primary Cardiologist:: Quay Burow, M.D.  INDICATIONS: Non-ST elevation MI, type I versus type II  PROCEDURE: 1. Left heart catheterization; 2.  Coronary angiography; 3. Left ventriculography  CONSENT:  The risks, benefits, and details of the procedure were explained in detail to the patient. Risks including death, stroke, heart attack, kidney injury, allergy, limb ischemia, bleeding and radiation injury were discussed. The patient verbalized understanding and wanted to proceed. Informed written consent was obtained.  PROCEDURE TECHNIQUE: After Xylocaine anesthesia a 5 French Slender sheath was placed in the right radial artery with a single anterior/posterior wall stick utilizing an Angiocath and the Seldinger technique. Coronary angiography was done using a 5 [place] JR 4 and JL 3.5 cm diagnostic catheter. Left ventriculography was done using the 5 [place] JR 4 catheter and hand injection.  The patient received 2500 units of IV heparin once the guidewire into the ascending aorta.  The case was terminated and a wrist band was applied to achieve hemostasis.  MEDICATIONS: Versed 1 mg and fentanyl 25 mcg IV  CONTRAST: Total of 70 cc.  COMPLICATIONS: None  HEMODYNAMICS: Aortic pressure 93/50 mmHg; LV pressure 90/4 mmHg; LVEDP 8 mm mercury  ANGIOGRAPHIC DATA: The left main  coronary artery is widely patent.  The left anterior descending artery is widely patent.  The left circumflex artery is proximal luminal irregularities. The vessel is overall widely patent.  The right coronary artery is widely patent. The stent in the mid vessel is without evidence of ISR.  LEFT VENTRICULOGRAM: Left ventricular angiogram was done in the 30 RAO projection and revealed normal LV cavity size. Mild inferior wall hypokinesis. Ejection fraction is 50%.  IMPRESSIONS: 1. Widely patent coronary arteries.  2. Low normal LV function with mild inferior wall hypokinesis  RECOMMENDATION: Per the primary team. Heparin can probably be discontinued and Xarelto resumed.[name]   Discharge Medications   Current Discharge Medication List    START taking these medications    Details  magnesium oxide (MAG-OX) 400 (241.3 MG) MG tablet Take 1 tablet (400 mg total) by mouth daily. Qty: 30 tablet, Refills: 2    nitroGLYCERIN (NITROSTAT) 0.4 MG SL tablet Place 1 tablet (0.4 mg total) under the tongue every 5 (five) minutes as needed for chest pain (up to 3 doses). Qty: 25 tablet, Refills: 3      CONTINUE these medications which have CHANGED   Details  metFORMIN (GLUCOPHAGE) 500 MG tablet Take 2 tablets (1,000 mg total) by mouth 2 (two) times daily with a meal.  Patient was instructed on AVS to resume the morning of 03/17/13    CONTINUE these medications which have NOT CHANGED   Details  atenolol (TENORMIN) 50 MG tablet Take 50 mg by mouth 2 (two) times daily.    atorvastatin (LIPITOR) 40 MG tablet Take 1 tablet (40 mg total) by mouth daily at 6 PM.     diltiazem (CARDIZEM CD) 120 MG 24 hr capsule Take 1 capsule (120 mg total) by mouth daily.     furosemide (LASIX) 20 MG tablet Take 1 tablet (20 mg total) by mouth daily.    Associated Diagnoses: Chronic diastolic heart failure; Atrial fibrillation; Essential hypertension, benign    gabapentin (NEURONTIN) 100 MG capsule Take 1 capsule (100 mg total) by mouth 3 (three) times daily.     glipiZIDE (GLUCOTROL) 10 MG tablet Take 2 tablets by mouth two times daily     vitamin B-12 (CYANOCOBALAMIN) 1000 MCG tablet Take 1,000 mcg by mouth every morning.     Rivaroxaban (XARELTO) 15 MG TABS tablet Take 15 mg by mouth at bedtime.    acetaminophen (TYLENOL) 500 MG tablet Take 1 tablet (500 mg total) by mouth every 6 (six) hours as needed.      Disposition   The patient will be discharged in stable condition to home. Discharge Orders   Future Appointments Provider Department Dept Phone   03/21/2013 11:30 AM Noralee Space, MD [facility] [phone]   03/21/2013 12:15 PM [facility] [place] Office [phone]   03/21/2013 2:40 PM Liliane Shi, PA-C [place] Office  [phone]   04/24/2013 10:00 AM Noralee Space, MD [facility] [phone]   05/07/2013 2:45 PM Evans Lance, MD [facility] [phone]   Future Orders Complete By Expires   Diet - low sodium heart healthy  As directed    Scheduling Instructions:     Diabetic Diet   Discharge instructions  As directed    Comments:     Do not restart your metformin until the morning of 03/17/13 (Sunday morning).   Increase activity slowly  As directed    Scheduling Instructions:     No  driving for 2 days. No lifting over 5 lbs for 1 week. No sexual activity for 1 week. Keep procedure site clean & dry. If you notice increased pain, swelling, bleeding or pus, call/return!  You may shower, but no soaking baths/hot tubs/pools for 1 week.     Follow-up Information   Follow up with Richardson Dopp, PA-C. (03/21/13 at 2:40pm with labwork afterwards)    Specialty:  Physician Assistant   Contact information:   [address] [place] [address] [phone]       Follow up with NADEL,SCOTT M, MD. (It is EXTREMELY IMPORTANT that you follow up with your primary care doctor for your uncontrolled diabetes. )    Specialty:  Pulmonary Disease   Contact information:   [place] [address] [phone]         Duration of Discharge Encounter: Greater than 30 minutes including physician and PA time.  Signed, Melina Copa PA-C 03/15/2013, 11:27 AM

## 2013-03-15 NOTE — Progress Notes (Signed)
    Subjective:  Feels well. Cath reassuring.   Objective:  Vital Signs in the last 24 hours: Temp:  [97.4 F (36.3 C)-98.4 F (36.9 C)] 97.6 F (36.4 C) (03/06 0356) Pulse Rate:  [73-92] 86 (03/06 0356) Resp:  [17-18] 18 (03/06 0356) BP: (91-127)/(54-76) 117/76 mmHg (03/06 0356) SpO2:  [95 %-98 %] 95 % (03/06 0356) Weight:  [112 lb 3.4 oz (50.9 kg)] 112 lb 3.4 oz (50.9 kg) (03/06 0356)  Intake/Output from previous day: 03/05 0701 - 03/06 0700 In: 238.9 [P.O.:120; I.V.:118.9] Out: 200 [Urine:200]   Physical Exam: General: Well developed, well nourished, in no acute distress. Head:  Normocephalic and atraumatic. Lungs: Clear to auscultation and percussion. Heart: Normal S1 and S2.  No murmur, rubs or gallops.  Abdomen: soft, non-tender, positive bowel sounds. Extremities: No clubbing or cyanosis. No edema. Cath site c/d/i Neurologic: Alert   Lab Results:  Recent Labs  03/14/13 0305 03/15/13 0430  WBC 5.5 5.0  HGB 13.3 11.9*  PLT 178 180    Recent Labs  03/14/13 0305 03/15/13 0430  NA 136* 135*  K 4.5 4.4  CL 96 100  CO2 30 24  GLUCOSE 142* 155*  BUN 13 11  CREATININE 0.73 0.60    Recent Labs  03/13/13 0930 03/13/13 1420  TROPONINI 0.41* 0.50*   Assessment/Plan:  Active Problems:   Permanent atrial fibrillation   Uncontrolled diabetes mellitus   Chest pain   Mitral regurgitation   Chronic combined systolic and diastolic CHF (congestive heart failure)   Hypokalemia   Unstable angina   CAD (coronary artery disease)   History of epistaxis   Abnormal TSH  -CATH - no CAD, RCA stent patent.  -Mildly elevated troponin, likely demand.  -Resume Xarelto this evening.  -OK to resume home dose lasix.  OK for DC. Follow up with Dr. Lovena Le or APP in 1-2 weeks.    Graviela Nodal, [place] 03/15/2013, 10:33 AM

## 2013-03-16 ENCOUNTER — Emergency Department (HOSPITAL_COMMUNITY): Payer: Medicare Other

## 2013-03-16 ENCOUNTER — Encounter (HOSPITAL_COMMUNITY): Payer: Self-pay | Admitting: Emergency Medicine

## 2013-03-16 ENCOUNTER — Emergency Department (HOSPITAL_COMMUNITY)
Admission: EM | Admit: 2013-03-16 | Discharge: 2013-03-17 | Disposition: A | Discharge status: Home / Self Care | Payer: Medicare Other | Attending: Emergency Medicine | Admitting: Emergency Medicine

## 2013-03-16 DIAGNOSIS — Z860100 Personal history of colon polyps, unspecified: Secondary | ICD-10-CM | POA: Insufficient documentation

## 2013-03-16 DIAGNOSIS — Z8673 Personal history of transient ischemic attack (TIA), and cerebral infarction without residual deficits: Secondary | ICD-10-CM | POA: Insufficient documentation

## 2013-03-16 DIAGNOSIS — R109 Unspecified abdominal pain: Secondary | ICD-10-CM

## 2013-03-16 DIAGNOSIS — E7800 Pure hypercholesterolemia, unspecified: Secondary | ICD-10-CM | POA: Insufficient documentation

## 2013-03-16 DIAGNOSIS — M1990 Unspecified osteoarthritis, unspecified site: Secondary | ICD-10-CM | POA: Insufficient documentation

## 2013-03-16 DIAGNOSIS — I4891 Unspecified atrial fibrillation: Secondary | ICD-10-CM

## 2013-03-16 DIAGNOSIS — R1013 Epigastric pain: Secondary | ICD-10-CM | POA: Insufficient documentation

## 2013-03-16 DIAGNOSIS — F411 Generalized anxiety disorder: Secondary | ICD-10-CM | POA: Insufficient documentation

## 2013-03-16 DIAGNOSIS — Z8719 Personal history of other diseases of the digestive system: Secondary | ICD-10-CM | POA: Insufficient documentation

## 2013-03-16 DIAGNOSIS — Z87448 Personal history of other diseases of urinary system: Secondary | ICD-10-CM | POA: Insufficient documentation

## 2013-03-16 DIAGNOSIS — Z79899 Other long term (current) drug therapy: Secondary | ICD-10-CM | POA: Insufficient documentation

## 2013-03-16 DIAGNOSIS — I2510 Atherosclerotic heart disease of native coronary artery without angina pectoris: Secondary | ICD-10-CM | POA: Insufficient documentation

## 2013-03-16 DIAGNOSIS — I10 Essential (primary) hypertension: Secondary | ICD-10-CM | POA: Insufficient documentation

## 2013-03-16 DIAGNOSIS — E119 Type 2 diabetes mellitus without complications: Secondary | ICD-10-CM | POA: Insufficient documentation

## 2013-03-16 DIAGNOSIS — I5042 Chronic combined systolic (congestive) and diastolic (congestive) heart failure: Secondary | ICD-10-CM | POA: Insufficient documentation

## 2013-03-16 LAB — BASIC METABOLIC PANEL
BUN: 15 mg/dL (ref 6–23)
CO2: 25 meq/L (ref 19–32)
CREATININE: 0.78 mg/dL (ref 0.50–1.10)
Calcium: 11.1 mg/dL — ABNORMAL HIGH (ref 8.4–10.5)
Chloride: 88 mEq/L — ABNORMAL LOW (ref 96–112)
GFR calc Af Amer: 89 mL/min — ABNORMAL LOW (ref >90)
GFR calc non Af Amer: 77 mL/min — ABNORMAL LOW (ref >90)
Glucose, Bld: 410 mg/dL — ABNORMAL HIGH (ref 70–99)
Potassium: 4.7 mEq/L (ref 3.7–5.3)
SODIUM: 132 meq/L — AB (ref 137–147)

## 2013-03-16 LAB — CBC
HCT: 42.9 % (ref 36.0–46.0)
HEMOGLOBIN: 14.9 g/dL (ref 12.0–15.0)
MCH: 30.9 pg (ref 26.0–34.0)
MCHC: 34.7 g/dL (ref 30.0–36.0)
MCV: 89 fL (ref 78.0–100.0)
Platelets: 216 10*3/uL (ref 150–400)
RBC: 4.82 MIL/uL (ref 3.87–5.11)
RDW: 15.5 % (ref 11.5–15.5)
WBC: 6.8 10*3/uL (ref 4.0–10.5)

## 2013-03-16 LAB — I-STAT TROPONIN, ED: Troponin i, poc: 0.07 ng/mL (ref 0.00–0.08)

## 2013-03-16 MED ORDER — ONDANSETRON HCL 4 MG/2ML IJ SOLN
4.0000 mg | Freq: Once | INTRAMUSCULAR | Status: DC
  Filled 2013-03-16: qty 2

## 2013-03-16 MED ORDER — ONDANSETRON HCL 4 MG/2ML IJ SOLN
4.0000 mg | Freq: Once | INTRAMUSCULAR | Status: AC
  Administered 2013-03-17: 4 mg via INTRAVENOUS
  Filled 2013-03-16: qty 2

## 2013-03-16 MED ORDER — MORPHINE SULFATE 4 MG/ML IJ SOLN
4.0000 mg | Freq: Once | INTRAMUSCULAR | Status: AC
  Administered 2013-03-17: 4 mg via INTRAVENOUS
  Filled 2013-03-16: qty 1

## 2013-03-16 MED ORDER — SODIUM CHLORIDE 0.9 % IV BOLUS (SEPSIS)
1000.0000 mL | Freq: Once | INTRAVENOUS | Status: AC
  Administered 2013-03-17: 1000 mL via INTRAVENOUS

## 2013-03-16 NOTE — ED Notes (Signed)
Pt c/o cp since yesterday.  sts was seen here yesterday for same. Feels like her heart is racing.

## 2013-03-16 NOTE — ED Provider Notes (Signed)
CSN: [id number]     Arrival date & time 03/16/13  2156 History   First MD Initiated Contact with Patient 03/16/13 2307     Chief Complaint  Patient presents with  . Chest Pain     (Consider location/radiation/quality/duration/timing/severity/associated sxs/prior Treatment) HPI 80 yo woman with multiple chronic medical problems including CAD, chronic AF with anticoagulation and multiple comorbidities. She is here with complaints of severe abdominal pain which began after she ate a dinner of chicken nuggests at 1900h. Her pain has been constant and severe since then. Pain is aching, non radiating and 10/10. Nothing makes the pain worse or better. Patient says she needs but cannot and feels nauseated but is unable to vomiting. She denies history of similar sx. Denies chest pain. Last BM was 1800 and normal.   Of note, the patient has not had any Xarelto since "one day last week" because she ran out of the medication.    Past Medical History  Diagnosis Date  . HTN (hypertension)   . Pure hypercholesterolemia   . Type II or unspecified type diabetes mellitus without mention of complication, not stated as uncontrolled   . Anxiety state, unspecified   . Diaphragmatic hernia without mention of obstruction or gangrene   . Benign neoplasm of colon   . Mixed incontinence urge and stress (female)(female)   . Osteoarthrosis, unspecified whether generalized or localized, unspecified site   . Lumbago   . Osteoporosis, unspecified   . Memory loss   . Renal infarction     01/2011 in setting of low INR  . History of stroke   . AF (atrial fibrillation)     a. Chronic Xarelto  . CAD (coronary artery disease)     a. 05/2012 NSTEMI/Cath/PCI: LM nl, LAD min irregs, LCX min irregs, RI 40-50 ost, OM1 nl, RCA dom, 74m (4.0x12 Vision BMS), PD/PL nl, EF 55%, at least mod MR. b. NSTEMI 03/2013 - cath with widely patent coronaries, suspect demand ischemia due to RVR.  . Mitral regurgitation     a. mild by echo  06/2012.  [name] Chronic combined systolic and diastolic CHF (congestive heart failure)     a. 01/2011 Echo: EF 40-45%, diff HK, diast dysfxn, mild to mod MR, mod to sev dil LA/RV/RA, mod to sev reduced RV fxn, mod TR, PASP 30mmHg.;  b.  Echo 06/15/12:  EF 55%, mild MR, severe BAE, mod TR, PASP 35  . Hx of cardiovascular stress test     a. Myoview in 2006 was negative for ischemia, EF 54%. Martin Majestic on to have CAD development 05/2012.)  . Epistaxis     a. s/p cauterization 11/2012.  [name] Hypomagnesemia    Past Surgical History  Procedure Laterality Date  . Cholecystectomy    . Rt.leg surgery  11/2010  . Cataract surgery/both eyes      01/2012 left eye---02/2012 right eye   Family History  Problem Relation Age of Onset  . Stomach cancer Father   . Pneumonia Mother   . CAD Brother    History  Substance Use Topics  . Smoking status: Never Smoker   . Smokeless tobacco: Never Used  . Alcohol Use: No   OB History   Grav Para Term Preterm Abortions TAB SAB Ect Mult Living                 Review of Systems Ten point review of symptoms performed and is negative with the exception of symptoms noted above.     Allergies  Peanut-containing drug products; Azithromycin; Flomax; Pioglitazone; Pregabalin; and Sulfonamide derivatives  Home Medications   Current Outpatient Rx  Name  Route  Sig  Dispense  Refill  . acetaminophen (TYLENOL) 500 MG tablet   Oral   Take 1 tablet (500 mg total) by mouth every 6 (six) hours as needed.         [name] atenolol (TENORMIN) 50 MG tablet   Oral   Take 50 mg by mouth 2 (two) times daily.         [name] atorvastatin (LIPITOR) 40 MG tablet   Oral   Take 1 tablet (40 mg total) by mouth daily at 6 PM.   30 tablet   6   . diltiazem (CARDIZEM CD) 120 MG 24 hr capsule   Oral   Take 1 capsule (120 mg total) by mouth daily.   30 capsule   6   . furosemide (LASIX) 20 MG tablet   Oral   Take 1 tablet (20 mg total) by mouth daily.   30 tablet   11   . gabapentin  (NEURONTIN) 100 MG capsule   Oral   Take 1 capsule (100 mg total) by mouth 3 (three) times daily.   90 capsule   5   . glipiZIDE (GLUCOTROL) 10 MG tablet      Take 2 tablets by mouth two times daily   120 tablet   6   . magnesium oxide (MAG-OX) 400 (241.3 MG) MG tablet   Oral   Take 1 tablet (400 mg total) by mouth daily.   30 tablet   2   . metFORMIN (GLUCOPHAGE) 500 MG tablet   Oral   Take 2 tablets (1,000 mg total) by mouth 2 (two) times daily with a meal.         . nitroGLYCERIN (NITROSTAT) 0.4 MG SL tablet   Sublingual   Place 1 tablet (0.4 mg total) under the tongue every 5 (five) minutes as needed for chest pain (up to 3 doses).   25 tablet   3   . Rivaroxaban (XARELTO) 15 MG TABS tablet   Oral   Take 15 mg by mouth at bedtime.         . vitamin B-12 (CYANOCOBALAMIN) 1000 MCG tablet   Oral   Take 1,000 mcg by mouth every morning.           There were no vitals taken for this visit. Physical Exam Gen: well developed and well nourished appearing Head: NCAT Eyes: PERL, EOMI Nose: no epistaixis or rhinorrhea Mouth/throat: mucosa is moist and pink Neck: supple, no stridor Lungs: CTA B, no wheezing, rhonchi or rales CV: regular rate and rythm, good distal pulses.  Abd: soft, mild tenderness over the midline epigastrium notender, nondistended DRE: stool brown and hemeoccult negative Back: no ttp, no cva ttp Skin: warm and dry Ext: no edema, normal to inspection Neuro: CN ii-xii grossly intact, no focal deficits Psyche; normal affect,  calm and cooperative.  ED Course  Procedures (including critical care time) Labs Review  Results for orders placed during the hospital encounter of 03/16/13 (from the past 24 hour(s))  CBC     Status: None   Collection Time    03/16/13 10:31 PM      Result Value Ref Range   WBC 6.8  4.0 - 10.5 K/uL   RBC 4.82  3.87 - 5.11 MIL/uL   Hemoglobin 14.9  12.0 - 15.0 g/dL   HCT 42.9  36.0 - 46.0 %  MCV 89.0  78.0 -  100.0 fL   MCH 30.9  26.0 - 34.0 pg   MCHC 34.7  30.0 - 36.0 g/dL   RDW 15.5  11.5 - 15.5 %   Platelets 216  150 - 400 K/uL  BASIC METABOLIC PANEL     Status: Abnormal   Collection Time    03/16/13 10:31 PM      Result Value Ref Range   Sodium 132 (*) 137 - 147 mEq/L   Potassium 4.7  3.7 - 5.3 mEq/L   Chloride 88 (*) 96 - 112 mEq/L   CO2 25  19 - 32 mEq/L   Glucose, Bld 410 (*) 70 - 99 mg/dL   BUN 15  6 - 23 mg/dL   Creatinine, Ser 0.78  0.50 - 1.10 mg/dL   Calcium 11.1 (*) 8.4 - 10.5 mg/dL   GFR calc non Af Amer 77 (*) >90 mL/min   GFR calc Af Amer 89 (*) >90 mL/min  I-STAT TROPOININ, ED     Status: None   Collection Time    03/16/13 10:33 PM      Result Value Ref Range   Troponin i, poc 0.07  0.00 - 0.08 ng/mL   Comment 3           LIPASE, BLOOD     Status: Abnormal   Collection Time    03/16/13 11:34 PM      Result Value Ref Range   Lipase 66 (*) 11 - 59 U/L  PROTIME-INR     Status: None   Collection Time    03/16/13 11:34 PM      Result Value Ref Range   Prothrombin Time 13.8  11.6 - 15.2 seconds   INR 1.08  0.00 - 1.49  APTT     Status: None   Collection Time    03/16/13 11:34 PM      Result Value Ref Range   aPTT 24  24 - 37 seconds  LACTIC ACID, PLASMA     Status: None   Collection Time    03/16/13 11:36 PM      Result Value Ref Range   Lactic Acid, Venous 2.2  0.5 - 2.2 mmol/L  I-STAT CREATININE, ED     Status: None   Collection Time    03/17/13 12:01 AM      Result Value Ref Range   Creatinine, Ser 0.90  0.50 - 1.10 mg/dL   EKG: AF with RVR, rate 120s, no acute ischemic changes, chronic T wave inversions in lateral leads, normal qrs. Normal axis  MDM   Patient with epigastric pain which has resolved with intervention. She is noted to be hyperglycemic. We have treated with IVF and will recheck. CTA of the a/p performed to rule out mesenteric ischemia. This study is normal. CXR notable for cardiomegaly. First trop 0.07. We will recheck. The patient is  noted to have some RVR and we will treat with diltiazem.   We will reassess for disposition following result of 2nd troponin. Patient remains pain free.   Delta troponin is wnl. The patient is stable for discharge with plan for outpatient f/u.    Elyn Peers, MD 03/17/13 [phone]

## 2013-03-17 ENCOUNTER — Encounter (HOSPITAL_COMMUNITY): Payer: Self-pay | Admitting: Radiology

## 2013-03-17 ENCOUNTER — Emergency Department (HOSPITAL_COMMUNITY): Payer: Medicare Other

## 2013-03-17 LAB — I-STAT CREATININE, ED: Creatinine, Ser: 0.9 mg/dL (ref 0.50–1.10)

## 2013-03-17 LAB — LIPASE, BLOOD: Lipase: 66 U/L — ABNORMAL HIGH (ref 11–59)

## 2013-03-17 LAB — APTT: aPTT: 24 seconds (ref 24–37)

## 2013-03-17 LAB — LACTIC ACID, PLASMA: Lactic Acid, Venous: 2.2 mmol/L (ref 0.5–2.2)

## 2013-03-17 LAB — I-STAT TROPONIN, ED: Troponin i, poc: 0.05 ng/mL (ref 0.00–0.08)

## 2013-03-17 LAB — PROTIME-INR
INR: 1.08 (ref 0.00–1.49)
PROTHROMBIN TIME: 13.8 s (ref 11.6–15.2)

## 2013-03-17 MED ORDER — DILTIAZEM HCL 25 MG/5ML IV SOLN
10.0000 mg | Freq: Once | INTRAVENOUS | Status: AC
  Administered 2013-03-17: 10 mg via INTRAVENOUS
  Filled 2013-03-17: qty 5

## 2013-03-17 MED ORDER — IOHEXOL 350 MG/ML SOLN
100.0000 mL | Freq: Once | INTRAVENOUS | Status: AC | PRN
  Administered 2013-03-17: 100 mL via INTRAVENOUS

## 2013-03-17 NOTE — ED Notes (Signed)
Transported to CT 

## 2013-03-17 NOTE — ED Notes (Signed)
Pt ambulated with little assistance.  

## 2013-03-17 NOTE — ED Notes (Signed)
Departure condition VS in error. VS listed while in process. No disposition set by EDP.

## 2013-03-17 NOTE — ED Notes (Signed)
Dr. Cheri Guppy aware of elevated HR and asymptomatic.

## 2013-03-18 NOTE — Telephone Encounter (Signed)
NA

## 2013-03-20 NOTE — Telephone Encounter (Signed)
TCM call to patient.Patient stated she is doing good no complaints.Patient is taking medication and understands discharge instructions.Advised to keep post hospital appointment with Richardson Dopp PA 03/21/13 at 2:40 pm.

## 2013-03-21 ENCOUNTER — Ambulatory Visit: Payer: Medicare Other | Admitting: Pulmonary Disease

## 2013-03-21 ENCOUNTER — Encounter: Payer: Medicare Other | Admitting: Physician Assistant

## 2013-03-21 ENCOUNTER — Other Ambulatory Visit: Payer: Medicare Other

## 2013-03-21 ENCOUNTER — Ambulatory Visit: Payer: Medicare Other | Admitting: Adult Health

## 2013-03-21 NOTE — Progress Notes (Signed)
Opened in error

## 2013-03-22 ENCOUNTER — Other Ambulatory Visit: Payer: Self-pay | Admitting: *Deleted

## 2013-03-22 MED ORDER — ATORVASTATIN CALCIUM 40 MG PO TABS: 40.0000 mg | ORAL_TABLET | Freq: Every day | ORAL | Status: DC

## 2013-04-01 ENCOUNTER — Other Ambulatory Visit: Payer: Medicare Other

## 2013-04-01 ENCOUNTER — Encounter: Payer: Self-pay | Admitting: Physician Assistant

## 2013-04-01 ENCOUNTER — Ambulatory Visit (INDEPENDENT_AMBULATORY_CARE_PROVIDER_SITE_OTHER): Payer: Medicare Other | Admitting: Physician Assistant

## 2013-04-01 VITALS — BP 128/70 | HR 74 | Ht 65.0 in | Wt 115.0 lb

## 2013-04-01 DIAGNOSIS — I10 Essential (primary) hypertension: Secondary | ICD-10-CM

## 2013-04-01 DIAGNOSIS — E7800 Pure hypercholesterolemia, unspecified: Secondary | ICD-10-CM

## 2013-04-01 DIAGNOSIS — I2510 Atherosclerotic heart disease of native coronary artery without angina pectoris: Secondary | ICD-10-CM

## 2013-04-01 DIAGNOSIS — I5042 Chronic combined systolic (congestive) and diastolic (congestive) heart failure: Secondary | ICD-10-CM

## 2013-04-01 DIAGNOSIS — I4891 Unspecified atrial fibrillation: Secondary | ICD-10-CM

## 2013-04-01 LAB — BASIC METABOLIC PANEL
BUN: 14 mg/dL (ref 6–23)
CO2: 31 meq/L (ref 19–32)
Calcium: 9.7 mg/dL (ref 8.4–10.5)
Chloride: 97 mEq/L (ref 96–112)
Creatinine, Ser: 0.9 mg/dL (ref 0.4–1.2)
GFR: 66.49 mL/min (ref >60.00)
GLUCOSE: 107 mg/dL — AB (ref 70–99)
Potassium: 3.8 mEq/L (ref 3.5–5.1)
SODIUM: 137 meq/L (ref 135–145)

## 2013-04-01 LAB — MAGNESIUM: Magnesium: 1.2 mg/dL — ABNORMAL LOW (ref 1.5–2.5)

## 2013-04-01 NOTE — Progress Notes (Signed)
[place], [place], [place]  [address] Phone: [phone] Fax:  [phone]  Date:  04/01/2013   ID:  Michelle Herrera, DOB 1930/08/11, MRN [id number]  PCP:  Noralee Space, MD  Cardiologist:  Dr. Dorris Carnes   Electrophysiologist:  Dr. Cristopher Peru    History of Present Illness: Michelle Herrera is a 80 y.o. female with a hx of CAD, s/p NSTEMI in 05/2012 treated with BMS to RCA, permanent AFib, combined systolic and diastolic CHF, mitral regurgitation, DM2, HTN, HL. She has failed Tikosyn and amiodarone therapy.  She suffered an acute renal infarct 2/2 microemboli in 01/2011.  Echocardiogram 01/2011: Septal hypokinesis, mild LVH, EF 40-45%, diast dysfunction, mild-mod MR, mod-severe LAE, mod-severe RVE, mod-severe decreased RVSF, mod-severe LAE, moderate TR, PASP 45.    At time of LHC in 5/14, she had moderate MR on left ventriculogram.  F/u Echo 06/15/12: EF 55%, mild MR, severe BAE, mod TR, PASP 35.   She has a hx of epistaxis requiring electrocautery and her Xarelto was reduced to 15 QD.    She was admitted 01/2013 with volume overload in the setting of AFib with RVR.    She returns for follow up after a recent admission to the hospital for AFib with RVR.  She had mild elevation in CEs and underwent cardiac cath.  This demonstrated patent coronary arteries and patent RCA stent.  NSTEMI was felt to be from demand ischemia.  MgOx was started at d/c.  She is doing well since discharge from the hospital. She has chronic dyspnea with exertion (NYHA class 2-2b). She denies chest pain. She denies her orthopnea or PND. LE edema is improved. She denies syncope. She does note occasional lightheadedness.  Recent Labs: 05/27/2012: ALT 8  10/18/2012: HDL Cholesterol 64.60; LDL (calc) 61  03/12/2013: Pro B Natriuretic peptide (BNP) 3055.0*  03/13/2013: TSH 2.842  03/16/2013: Hemoglobin 14.9; Potassium 4.7  03/17/2013: Creatinine 0.90   Wt Readings from Last 3 Encounters:  04/01/13 115 lb  (52.164 kg)  03/15/13 112 lb 3.4 oz (50.9 kg)  03/15/13 112 lb 3.4 oz (50.9 kg)     Past Medical History  Diagnosis Date  . HTN (hypertension)   . Pure hypercholesterolemia   . Type II or unspecified type diabetes mellitus without mention of complication, not stated as uncontrolled   . Anxiety state, unspecified   . Diaphragmatic hernia without mention of obstruction or gangrene   . Benign neoplasm of colon   . Mixed incontinence urge and stress (female)(female)   . Osteoarthrosis, unspecified whether generalized or localized, unspecified site   . Lumbago   . Osteoporosis, unspecified   . Memory loss   . Renal infarction     01/2011 in setting of low INR  . History of stroke   . AF (atrial fibrillation)     a. Chronic Xarelto  . CAD (coronary artery disease)     a. 05/2012 NSTEMI/Cath/PCI: LM nl, LAD min irregs, LCX min irregs, RI 40-50 ost, OM1 nl, RCA dom, 53m (4.0x12 Vision BMS), PD/PL nl, EF 55%, at least mod MR. b. NSTEMI 03/2013 - cath with widely patent coronaries, suspect demand ischemia due to RVR.  . Mitral regurgitation     a. mild by echo 06/2012.  [name] Chronic combined systolic and diastolic CHF (congestive heart failure)     a. 01/2011 Echo: EF 40-45%, diff HK, diast dysfxn, mild to mod MR, mod to sev dil LA/RV/RA, mod to sev reduced RV fxn, mod  TR, PASP 27mmHg.;  b.  Echo 06/15/12:  EF 55%, mild MR, severe BAE, mod TR, PASP 35  . Hx of cardiovascular stress test     a. Myoview in 2006 was negative for ischemia, EF 54%. Martin Majestic on to have CAD development 05/2012.)  . Epistaxis     a. s/p cauterization 11/2012.  [name] Hypomagnesemia     Current Outpatient Prescriptions  Medication Sig Dispense Refill  . acetaminophen (TYLENOL) 500 MG tablet Take 1 tablet (500 mg total) by mouth every 6 (six) hours as needed.      [name] atenolol (TENORMIN) 50 MG tablet Take 50 mg by mouth 2 (two) times daily.      [name] atorvastatin (LIPITOR) 40 MG tablet Take 1 tablet (40 mg total) by mouth daily at 6 PM.   30 tablet  0  . diltiazem (CARDIZEM CD) 120 MG 24 hr capsule Take 1 capsule (120 mg total) by mouth daily.  30 capsule  6  . furosemide (LASIX) 20 MG tablet Take 1 tablet (20 mg total) by mouth daily.  30 tablet  11  . gabapentin (NEURONTIN) 100 MG capsule Take 1 capsule (100 mg total) by mouth 3 (three) times daily.  90 capsule  5  . glipiZIDE (GLUCOTROL) 10 MG tablet Take 2 tablets by mouth two times daily  120 tablet  6  . magnesium oxide (MAG-OX) 400 (241.3 MG) MG tablet Take 1 tablet (400 mg total) by mouth daily.  30 tablet  2  . metFORMIN (GLUCOPHAGE) 500 MG tablet Take 2 tablets (1,000 mg total) by mouth 2 (two) times daily with a meal.      . nitroGLYCERIN (NITROSTAT) 0.4 MG SL tablet Place 1 tablet (0.4 mg total) under the tongue every 5 (five) minutes as needed for chest pain (up to 3 doses).  25 tablet  3  . Rivaroxaban (XARELTO) 15 MG TABS tablet Take 15 mg by mouth at bedtime.      . vitamin B-12 (CYANOCOBALAMIN) 1000 MCG tablet Take 1,000 mcg by mouth every morning.        No current facility-administered medications for this visit.    Allergies:   Peanut-containing drug products; Azithromycin; Flomax; Pioglitazone; Pregabalin; and Sulfonamide derivatives   Social History:  The patient  reports that she has never smoked. She has never used smokeless tobacco. She reports that she does not drink alcohol or use illicit drugs.   Family History:  The patient's family history includes CAD in her brother; Pneumonia in her mother; Stomach cancer in her father.   ROS:  Please see the history of present illness. She notes a recent history of pinworms.    All other systems reviewed and negative.   PHYSICAL EXAM: VS:  BP 128/70  Pulse 74  Ht 5\' 5"  (1.651 m)  Wt 115 lb (52.164 kg)  BMI 19.14 kg/m2 Well nourished, well developed, in no acute distress HEENT: normal Neck:  no JVD Cardiac:  normal S1, S2; irregularly irregular rhythm; no murmur Lungs:  clear to auscultation bilaterally,  no wheezing, rhonchi or rales Abd: soft, nontender, no hepatomegaly Ext: trace-1+1+ bilateral ankle edema Skin: warm and dry Neuro:  CNs 2-12 intact, no focal abnormalities noted  EKG:  Atrial fibrillation, HR 74, normal axis, T-wave inversions in 2, 3, aVF, V4-6     ASSESSMENT AND PLAN:  1. Chronic Diastolic CHF:  Volume appears stable.  Check a follow up basic metabolic panel and magnesium today.   2. Atrial Fibrillation:  Heart rate  controlled. She does have occasional lightheadedness. I question if her most recent admission to the hospital with atrial fibrillation with RVR was related to medical noncompliance. She tells me that her daughter is now helping her with her medications. She has been out of Xarelto since discharge from the hospital. This will be refilled for her today.  Obtain 24-hour Holter monitor to ensure good heart rate control and no significant bradycardia/pauses. 3. Hypertension:  Controlled.   4. CAD:  No angina.  She is not on ASA as she is on Xarelto.  Continue statin. 5. Hyperlipidemia:  Continue statin. 6. Mitral Regurgitation:  Mild by last echo. 7. Pinworms: I have asked her to complete the treatment that has been prescribed and follow up with primary care. 8. Disposition:  F/u with Dr. Lovena Le next month as planned.    Signed, Richardson Dopp, PA-C  04/01/2013 3:18 PM

## 2013-04-01 NOTE — Patient Instructions (Signed)
RE-START XARELTO 15 MG DAILY  YOU HAVE BEEN GIVEN PAPER WORK TO FILL OUT FOR THE [place]; ONCE YOU FILL OUT THE PAPER WORK AND PROVIDE THE FINANCIAL INFORMATION REQUIRED YOU WILL BRING THE FORMS BACK AND GIVE THEM TO KIM  ADAMS, RN  LAB WORK TODAY;  BMET, MAGNESIUM  Your physician has recommended that you wear a 24 HOUR holter monitor. Holter monitors are medical devices that record the heart's electrical activity. Doctors most often use these monitors to diagnose arrhythmias. Arrhythmias are problems with the speed or rhythm of the heartbeat. The monitor is a small, portable device. You can wear one while you do your normal daily activities. This is usually used to diagnose what is causing palpitations/syncope (passing out).  MAKE SURE KEEP YOUR FOLLOW UP WITH DR. Lovena Le IN 04/2013

## 2013-04-02 ENCOUNTER — Telehealth: Payer: Self-pay | Admitting: *Deleted

## 2013-04-02 DIAGNOSIS — I5042 Chronic combined systolic (congestive) and diastolic (congestive) heart failure: Secondary | ICD-10-CM

## 2013-04-02 NOTE — Telephone Encounter (Signed)
pt notifiedc about her lab results and when I asked how much magnesium she was taking she did not know and said to s/w her daughter-in-law Anne Ng about that @ [phone]. I s/w Anne Ng and she did not have meds in front of her I w/cb tomorrow

## 2013-04-03 MED ORDER — MAGNESIUM OXIDE 400 (241.3 MG) MG PO TABS: 400.0000 mg | ORAL_TABLET | Freq: Two times a day (BID) | ORAL | Status: DC

## 2013-04-03 NOTE — Telephone Encounter (Signed)
per pt yesterday to s/w daughter-in-law Anne Ng about results. See phone note 3/24.Marland KitchenAnne Ng states today pt is on magnesium 400 mg daily. I advised per Brynda Rim. PA to increase to BID, mag level 4/15 as could it be done when she sees Dr. Lenna Gilford,

## 2013-04-04 ENCOUNTER — Encounter: Payer: Self-pay | Admitting: Radiology

## 2013-04-04 ENCOUNTER — Encounter (INDEPENDENT_AMBULATORY_CARE_PROVIDER_SITE_OTHER): Payer: Medicare Other

## 2013-04-04 DIAGNOSIS — I4891 Unspecified atrial fibrillation: Secondary | ICD-10-CM

## 2013-04-04 NOTE — Progress Notes (Signed)
Patient ID: Michelle Herrera, female   DOB: 1930/04/16, 80 y.o.   MRN: [id number] E cardio 24hr holter applied

## 2013-04-08 ENCOUNTER — Telehealth: Payer: Self-pay | Admitting: *Deleted

## 2013-04-08 NOTE — Telephone Encounter (Signed)
Patient assistance application with MD part completed for patients XARELTO  Faxed To [facility] and [place] today.

## 2013-04-09 ENCOUNTER — Telehealth: Payer: Self-pay | Admitting: *Deleted

## 2013-04-09 NOTE — Telephone Encounter (Signed)
PA to [facility] for xarelto

## 2013-04-15 ENCOUNTER — Telehealth: Payer: Self-pay | Admitting: Pulmonary Disease

## 2013-04-15 MED ORDER — TRAMADOL HCL 50 MG PO TABS: 50.0000 mg | ORAL_TABLET | Freq: Three times a day (TID) | ORAL | Status: DC | PRN

## 2013-04-15 NOTE — Telephone Encounter (Signed)
Called spoke with patient who c/o bilateral hip discomfort and low back pain for "several weeks."  Pt denies any falling or trauma.  She believes that it is from her Metformin 1,000mg  BID and Glipizide 20mg  BID.  She does have a follow up scheduled with SN for 4.15.15 for follow up.  Pt is already aware that as of April 1 SN is considered a "specialist" rather than PCP, but I did inform her that we will happily continue to treat her but are unsure of what her insurance will pay for and she will have a higher copay.  She is not set up with another PCP as of yet.  Dr Lenna Gilford please advise, thank you.

## 2013-04-15 NOTE — Telephone Encounter (Signed)
Per SN--  Ok to call in tramadol 50 mg  #90  1 po tid prn pain.  Called and spoke with pt and she is aware of SN recs and that this has been called to her pharmacy.  Pt is also aware that she can take tylenol with the tramadol to help with the pain.  Pt voiced her understanding and nothing further is needed.

## 2013-04-23 ENCOUNTER — Telehealth: Payer: Self-pay | Admitting: *Deleted

## 2013-04-23 NOTE — Telephone Encounter (Signed)
Monitor reviewed by dr Harrington Challenger shows atrial fib 39 to 101 bpm with average hr 72 bpm. occ pvc's. Longest pause 2.37 secs. Per dr Harrington Challenger no change in current medicine Pt made aware.

## 2013-04-24 ENCOUNTER — Encounter: Payer: Self-pay | Admitting: Pulmonary Disease

## 2013-04-24 ENCOUNTER — Other Ambulatory Visit (INDEPENDENT_AMBULATORY_CARE_PROVIDER_SITE_OTHER): Payer: Medicare Other

## 2013-04-24 ENCOUNTER — Ambulatory Visit (INDEPENDENT_AMBULATORY_CARE_PROVIDER_SITE_OTHER): Payer: Medicare Other | Admitting: Pulmonary Disease

## 2013-04-24 VITALS — BP 110/64 | HR 78 | Temp 97.9°F | Ht 65.0 in | Wt 109.0 lb

## 2013-04-24 DIAGNOSIS — K449 Diaphragmatic hernia without obstruction or gangrene: Secondary | ICD-10-CM

## 2013-04-24 DIAGNOSIS — IMO0002 Reserved for concepts with insufficient information to code with codable children: Secondary | ICD-10-CM

## 2013-04-24 DIAGNOSIS — I509 Heart failure, unspecified: Secondary | ICD-10-CM

## 2013-04-24 DIAGNOSIS — D126 Benign neoplasm of colon, unspecified: Secondary | ICD-10-CM

## 2013-04-24 DIAGNOSIS — I4821 Permanent atrial fibrillation: Secondary | ICD-10-CM

## 2013-04-24 DIAGNOSIS — M545 Low back pain, unspecified: Secondary | ICD-10-CM

## 2013-04-24 DIAGNOSIS — I2510 Atherosclerotic heart disease of native coronary artery without angina pectoris: Secondary | ICD-10-CM

## 2013-04-24 DIAGNOSIS — R269 Unspecified abnormalities of gait and mobility: Secondary | ICD-10-CM

## 2013-04-24 DIAGNOSIS — I10 Essential (primary) hypertension: Secondary | ICD-10-CM

## 2013-04-24 DIAGNOSIS — E118 Type 2 diabetes mellitus with unspecified complications: Secondary | ICD-10-CM

## 2013-04-24 DIAGNOSIS — D649 Anemia, unspecified: Secondary | ICD-10-CM

## 2013-04-24 DIAGNOSIS — Z8673 Personal history of transient ischemic attack (TIA), and cerebral infarction without residual deficits: Secondary | ICD-10-CM

## 2013-04-24 DIAGNOSIS — M1990 Unspecified osteoarthritis, unspecified site: Secondary | ICD-10-CM

## 2013-04-24 DIAGNOSIS — N3946 Mixed incontinence: Secondary | ICD-10-CM

## 2013-04-24 DIAGNOSIS — I5042 Chronic combined systolic (congestive) and diastolic (congestive) heart failure: Secondary | ICD-10-CM

## 2013-04-24 DIAGNOSIS — E1165 Type 2 diabetes mellitus with hyperglycemia: Secondary | ICD-10-CM

## 2013-04-24 DIAGNOSIS — R413 Other amnesia: Secondary | ICD-10-CM

## 2013-04-24 DIAGNOSIS — F411 Generalized anxiety disorder: Secondary | ICD-10-CM

## 2013-04-24 DIAGNOSIS — M810 Age-related osteoporosis without current pathological fracture: Secondary | ICD-10-CM

## 2013-04-24 DIAGNOSIS — I4891 Unspecified atrial fibrillation: Secondary | ICD-10-CM

## 2013-04-24 LAB — HEPATIC FUNCTION PANEL
ALK PHOS: 55 U/L (ref 39–117)
ALT: 14 U/L (ref 0–35)
AST: 22 U/L (ref 0–37)
Albumin: 4.4 g/dL (ref 3.5–5.2)
BILIRUBIN DIRECT: 0.2 mg/dL (ref 0.0–0.3)
Total Bilirubin: 1.2 mg/dL (ref 0.3–1.2)
Total Protein: 7.5 g/dL (ref 6.0–8.3)

## 2013-04-24 LAB — CBC WITH DIFFERENTIAL/PLATELET
BASOS PCT: 0.8 % (ref 0.0–3.0)
Basophils Absolute: 0 10*3/uL (ref 0.0–0.1)
EOS PCT: 1.2 % (ref 0.0–5.0)
Eosinophils Absolute: 0.1 10*3/uL (ref 0.0–0.7)
HEMATOCRIT: 40.2 % (ref 36.0–46.0)
HEMOGLOBIN: 13.3 g/dL (ref 12.0–15.0)
LYMPHS ABS: 1 10*3/uL (ref 0.7–4.0)
Lymphocytes Relative: 16.9 % (ref 12.0–46.0)
MCHC: 33.1 g/dL (ref 30.0–36.0)
MCV: 94.2 fl (ref 78.0–100.0)
Monocytes Absolute: 0.6 10*3/uL (ref 0.1–1.0)
Monocytes Relative: 10.2 % (ref 3.0–12.0)
Neutro Abs: 4 10*3/uL (ref 1.4–7.7)
Neutrophils Relative %: 70.9 % (ref 43.0–77.0)
Platelets: 227 10*3/uL (ref 150.0–400.0)
RBC: 4.27 Mil/uL (ref 3.87–5.11)
RDW: 18.1 % — ABNORMAL HIGH (ref 11.5–14.6)
WBC: 5.6 10*3/uL (ref 4.5–10.5)

## 2013-04-24 LAB — BASIC METABOLIC PANEL
BUN: 18 mg/dL (ref 6–23)
CHLORIDE: 94 meq/L — AB (ref 96–112)
CO2: 30 meq/L (ref 19–32)
Calcium: 10.3 mg/dL (ref 8.4–10.5)
Creatinine, Ser: 0.9 mg/dL (ref 0.4–1.2)
GFR: 63.93 mL/min (ref >60.00)
Glucose, Bld: 181 mg/dL — ABNORMAL HIGH (ref 70–99)
Potassium: 4.2 mEq/L (ref 3.5–5.1)
SODIUM: 136 meq/L (ref 135–145)

## 2013-04-24 LAB — IBC PANEL
Iron: 121 ug/dL (ref 42–145)
Saturation Ratios: 30.7 % (ref 20.0–50.0)
Transferrin: 281.1 mg/dL (ref 212.0–360.0)

## 2013-04-24 LAB — HEMOGLOBIN A1C: HEMOGLOBIN A1C: 9.3 % — AB (ref 4.6–6.5)

## 2013-04-24 LAB — MAGNESIUM: MAGNESIUM: 1.4 mg/dL — AB (ref 1.5–2.5)

## 2013-04-24 MED ORDER — MEGESTROL ACETATE 40 MG/ML PO SUSP: ORAL | Status: DC

## 2013-04-24 NOTE — Patient Instructions (Signed)
Today we updated your med list in our EPIC system...    Continue your current medications the same for now...  Today we did your follow up blood work...    We will contact you w/ the results when available...   We are adding MEGACE liquid for your appetite>     Take one teaspoon before each meal...    You should take a nutritional supplement like GLUCERNA- one can between meals...  Call for any questions or if we can be of service in any way.Marland KitchenMarland Kitchen

## 2013-04-25 ENCOUNTER — Encounter: Payer: Self-pay | Admitting: Pulmonary Disease

## 2013-04-25 ENCOUNTER — Other Ambulatory Visit: Payer: Self-pay | Admitting: *Deleted

## 2013-04-25 ENCOUNTER — Telehealth: Payer: Self-pay | Admitting: *Deleted

## 2013-04-25 DIAGNOSIS — R790 Abnormal level of blood mineral: Secondary | ICD-10-CM

## 2013-04-25 NOTE — Telephone Encounter (Signed)
Ok. Proceed as planned with MgOx 800 bid x 2 weeks, then 400 bid Richardson Dopp, PA-C   04/25/2013 10:12 PM

## 2013-04-25 NOTE — Telephone Encounter (Signed)
PA form completed and faxed.  Will hold message to wait for response.

## 2013-04-25 NOTE — Telephone Encounter (Signed)
Spoke with patient, she states her daughter in law, Anne Ng, organizes her pills in pill boxes and asked me to call her to discuss. Informed patient she needs repeat mg level and that she can have it done on 4/28 when she has her next cardiology appointment.  Spoke with Anne Ng, informed her of result notes below from [employer] States that patient has not been taking her evening pills.  She states it makes her stomach hurt. She also feels that patient is not taking ALL of the AM pills either. She brings her dinner each night and encourages her to eat and take her medications. States this has been happening for about the last 2 weeks.

## 2013-04-25 NOTE — Telephone Encounter (Signed)
Message copied by Rodman Key on Thu Apr 25, 2013  4:50 PM ------      Message from: [place], [place] T      Created: Wed Apr 24, 2013  4:38 PM       Magnesium remains low      Increase Mag Ox to 800 mg BID x 2 weeks, then resume 400 mg BID.      Repeat Mg level in 2 weeks.      Richardson Dopp, PA-C        04/24/2013 4:38 PM ------

## 2013-04-25 NOTE — Progress Notes (Signed)
Subjective:    Patient ID: Michelle Herrera, female    DOB: 1930-06-23, 80 y.o.   MRN: [id number]  HPI 80 y/o WF here for a follow up visit... she has mult medical problems including:    ~  April 19, 2011:  12mo ROV & post hosp visit> Adm 1/13 by [facility] w/ flank pain and found to have a renal infarct (& sub therapeutic INR); CT Abd revealed abn perfusion to the cortex of both kidneys raising the question of infarcts; UA was neg & cult- no growth; she was seen by VascSurg & no surg needed; her pain was relieved after one shot of MS; she has followed up w/ Urology DrEskridge- hx incontinence & small capacity bladder, notes reviewed, tried Myrbetriq w/o much benenfit she says...    Sugars were not well controlled in the hosp w/ mBS~ 153-209 & A1c=7.4 ==> A1c is now 8.6 & we will refer to Endocrinology...    She saw Cards 1/13 for f/u AFib, HBP, CHF> she was switched from Coumadin to [facility] (due to the renal infarcts & difficulty maintaining INR...  LABS 4/13:  FLP- fair on Simva40 w/ LDL 112;  Chems= ok x BS=208, A1c=8.6;  CBC- ok;  TSH=3.89;  VitD= 30;  VitB12=905   ~  August 19, 2011:  42mo ROV & Michelle Herrera says she was stung on her right hand by a "brown wasper"; she has been using cool compresses and Benedryl & is improved, she says;  No other complaints or concerns...     She is followed by DrTaylor for Cards> AFib, valv heart dis, HBP; on Atenolol 50mg Bid & Xarelto 20mg /d; she has occas sharp CP, palpit, SOB, but overall stable...    She is followed by DrEllison w/ DM on Metform500-2Bid & Glucotrol10Bid; last seen 6/13 & pt stopped Actos due to edema; Labs showed BS=163, A1c=7.5 which is sl improved from previous; rec same meds, better diet... We reviewed prob list, meds, xrays and labs> see below>>  LABS 8/13:  Chems- ok x BS=163 A1c=7.5   ~  April 16, 2012:  17mo ROV & Michelle Herrera is c/o "bad sinus" w/ congestion, drainage, dizziness; we discussed Rx w/ Augmentin 875mg Bid, Mucinex 600mg -2Bid, +Fluids and  nasal saline mist every 1-2H;  She is also c/o vulvar burning & irritation related to her urinary incont & she needs f/u w/ Urology; she will take Diflucan100mg /d along w/ Align & Activia yogurt...  We reviewed the following medical problems during today's office visit >>     HBP> on Aten50Bid; BP= 130/84 & she denies CP/ SOB/ edema, notes occas palpit...    Cardiomyopathy, Valvular Dis> she restricts sodium, not on a diuretic; relatively asymptomatic, no overt CHF & chemistries are ok...    AFib> on Xarelto20 (samples from Cards); followed by DrTaylor but last seen 4/13 & stable- she will sched f/u ROV soon...    Chol> ?on Simva40?; weight is stable 134#, BMI=22; FLP 4/13 showed TChol 211, TG 77, HDL 85, LDL 112    DM w/ neuropathy> on Metform500-2Bid, Glipiz10Bid; followed by DrEllison & last seen 6/13- Actos stopped due to edema; she claims that BS higher when her knee is hurting; Labs 4/13 showed BS=208, A1c=8.6 (needs Endocrine f/u)...    GI- HH, colon polyps, s/p GB> stable she says, not on regular PPI dosing, last colon was 2003 by DrStark w/ 68mm polyp & divertics- she is due for GI f/u appt...    Urinary incont> followed by DrEskridge, small bladder capacity, known cystocele,  trial of Myrtetriq was unsuccessful...    DJD, LBP, right shoulder pain> on Neurontin100Tid; Ortho evals from DrNorris- prev knee surg for torn cartilage, back discomfort is improved she says...    Osteoporosis, Vit D defic> Vit D level 2013 = 30 and she was asked to incr her OTC Vit D supplement to 2000u daily...    Memory Loss> Hx 2 sm infarcts on prev MRI, on Xarelto from Cards; she declines Aricept etc...    Anxiety> mod stress in the family, she declines anxiolytic meds...    B12 defic> on OTC oral B12 supplement 1000u daily... We reviewed prob list, meds, xrays and labs> see below for updates >>   LABS 4/14:  Pending, she never returned for these labs...   ~  October 18, 2012:  37mo ROV & Michelle Herrera is here for a routine  f/u, notes her memory is slipping & we discussed starting Aricept5 but she declines;  We reviewed the following medical problems during today's office visit >>     HBP> on Aten50Bid, Losar25,  & Lasix20prn; BP= 134/80 & she denies CP/ SOB/ edema, notes occas palpit...    CAD, Cardiomyopathy (sys & diast CHF), Valvular Dis w/ MR> on ASA81, Xarelto, statin, etc; Adm 5/14 by [facility] w/ NSTEMI, Cath=> stent to 95%RCA; followed by DrTaylor...    AFib> on Xarelto20 (samples from Cards); followed by DrTaylor & cardiology notes are reviewed...    Chol> on Lip40 since 5/14; weight is stable 130#, BMI=22; FLP 10/14 shows TChol 138, TG 63, HDL 65, LDL 61    DM w/ neuropathy> on Metform500-2Bid, Glipiz10Bid; followed by DrEllison & last seen 6/13- Actos stopped due to edema; she claims that BS higher when her knee is hurting; Labs 10/14 shows BS=181, A1c=8.8 & we decided to incr Glipiz10-2Bid...    GI- HH, colon polyps, s/p GB> stable she says, not on regular PPI dosing, last colon was 2003 by DrStark w/ 50mm polyp & divertics; denies abd pain, n/v, d/c, blood seen...    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    DJD, LBP, right shoulder pain> on Neurontin100Tid; Ortho evals from DrNorris- prev knee surg for torn cartilage, back discomfort is improved she says...    Osteoporosis, Vit D defic> Vit D level 2013 = 30 and she was asked to incr her OTC Vit D supplement to 2000u daily...    Memory Loss> Hx 2 sm infarcts on prev MRI, on ASA/ Xarelto from Cards; she declines Aricept etc...    Anxiety> mod stress in the family, she declines anxiolytic meds...    B12 defic> on OTC oral B12 supplement 1000u daily... We reviewed prob list, meds, xrays and labs> see below for updates >> OK Flu shot today....  LABS 10/14:  FLP- looks good on Lip40;  Chems- ok x BS=181, A1c=8.8.[name].  ~  April 24, 2013:  60mo ROV & Michelle Herrera has had an  eventful interval> followed by DrTaylor for Cards w/ AFib, CAD, biventric failure, pulmHTN;  Episode severe epistaxis 11/14 requiring electocautery & ASA was stopped & Xarelto was decreased to 15mg /d;  She was [facility] by [facility] 1/11 - 01/22/13 w/ incr SOB & palpit due to AFib w/ RVR and DiastolicCHF, she was diuresed, meds adjusted, and improved;  She was again Hosp 3/3 - 03/15/13 by [facility] w/ CP, AFib & RVR, NSTEMI likely from demand ischemia since cath showed widely patent coronaries, EF=50% w/ mild inferior wall  HK;  She had f/u w/ [name] 3/15 & doing satis...  We reviewed the following medical problems during today's office visit >>     HBP> on Aten50Bid, Diltiazem120,  & Lasix20; BP= 110/64 & she denies CP/ SOB/ edema, notes occas palpit...    CAD, Cardiomyopathy (sys & diast CHF), Valvular Dis w/ MR> off ASA81 due to epistaxis, on Xarelto15, Aten50Bid, Diltiazem120, Lasix20; Adm 5/14 by [facility] w/ NSTEMI, Cath=> stent to 95%RCA; followed by DrTaylor; Adm 1/15 & 3/15 w/ AFib, RVR, NSTEMI due to demand ischemia and recath w/ widely patent coronaries, EF=50%; meds adjusted...    AFib> on Xarelto15 due to epistaxis; followed by DrTaylor & cardiology notes are reviewed...    Chol> on Lip40; weight is down to 109#, BMI=18; FLP 10/14 shows TChol 138, TG 63, HDL 65, LDL 61    DM w/ neuropathy> on Metform500-2Bid, Glipiz10-2Bid; prev followed by DrEllison but last seen 6/13- Actos stopped due to edema; won't take additional meds due to cost; she claims that BS higher when her knee is hurting; Labs 10/14 showed BS=181, A1c=8.8 & Labs 4/15 showed BS=181, A1c=9.3;  I called pharm [facility] on [name]- she has filled #120 of each in March & April=> continue same & better low carb diet (may need the inexpensive 70/30 insulin)    GI- HH, colon polyps, s/p GB> stable she says, not on regular PPI dosing, last colon was 2003 by DrStark w/ 69mm polyp & divertics; denies abd pain, n/v, d/c, blood seen...    Urinary incont>  followed by DrEskridge- hx renal infarct 1/13, small bladder capacity, known cystocele, trial of Myrtetriq was unsuccessful...    DJD, LBP, right shoulder pain> on Neurontin100Tid; Ortho evals from DrNorris- prev knee surg for torn cartilage, back discomfort is improved she says...    Osteoporosis, Vit D defic> Vit D level 2013 = 30 and she was asked to incr her OTC Vit D supplement to 2000u daily...    Memory Loss> Hx 2 sm infarcts on prev MRI, on Xarelto from Cards; she declines Aricept etc...    Anxiety> mod stress in the family, she declines anxiolytic meds...    B12 defic> on OTC oral B12 supplement 1000u daily... We reviewed prob list, meds, xrays and labs> see below for updates >> SHE DID NOT BRING MED BOTTLES & DOES NOT KNOW HER MEDS!!!  LABS 4/15:  Chems- ok x BS=181, A1c=9.3;  LFTs- wnl;  CBC- wnl;  Fe=121 (31%sat);    ADDENDUM>> she notes that she has no appetite & "not eating"; she has lost 21# in 56mo down to 109# & BMI=18; but serum proteins WNL; we will treat w/ MEGACE 200mg  before meals Tid...           Problem List:     HYPERTENSION >> VALVULAR HEART DISEASE >> ATRIAL FIBRILLATION - stable on Rx w/ XARELTO 20mg /d & rate control strategy. ~  4/11:  she reports that she stopped her Lanoxin 0.125 on her own & doesn't want to restart. ~  8/11:  f/u DrTaylor & stable on Coumadin w/ rate control strategy, no changes made. ~  11/11:  Hosp by Neuro w/ TIA> 2DEcho showed mild LVH, diffuse HK w/ EF= 45%, thick pos MV leaflet w/ restrict motion & modMR, dilated LA&RA, severeTR & PAsys=49, can't r/o PFO... ~  8/12:  her BP=116/84 and she feels OK- denies HA, fatigue, visual changes, CP, palipit, dizziness, syncope, dyspnea, edema, etc... ~  12/12:  BP= 126/88 and she remains asymptomatic as above... ~  1/13:  2DEcho showed mild LVH w/ EF= 40-45% & diffuse HK, Diastolic dysfunction, modMR, severe LAE, modTR, Pasys=45 ~  She was switched from Coumadin to [facility] 1/13 due to the renal  infarcts & difficulty maintaining INR. ~  CXR 1/13 showed marked cardiomegaly, pulm vasc congestion, NAD... ~  4/13:  BP= 128/60 and she is feeling better she says;  BUN=15, Creat=0.8; She had f/u DrTaylor> noted to be doing well w/o symptoms- continue same meds & low Na. ~  EKG 4/13 showed AFib, rate 77, right ward axis & NSSTTWA... ~  8/13:  BP= 110/82 and she denies current CP, palpit, SOB, edema... ~  4/14: on Aten50Bid; BP= 130/84 & she denies CP/ SOB/ edema, notes occas palpit. ~  5/14:  Adm by Cards> NSTEMI, Cath w/ LAD minor irreg only, LCirc w/ ostial 40-50%, mid RCA 95%, EF 55%. PCI: Vision BMS to the mid RCA. There was moderate MR on left ventriculogram;  ~  CXR 5/14 showed cardiomeg, underlying COPD w/ apical scarring, DJD in spine, NAD... ~  EKG 6/14 showed AFib, rate72, NSSTTWA...  ~  2DEcho 6/14 showed norm LV size & funct w/ EF=55%, mild MR, severe RA&LA dil, modTR,  ~  Adm 1/15 & 3/15 w/ CP, AFib w/ RVR, had NSTEMI 3/15 from demand ischemia; cath w/ patent coronaries, EF=50%; meds adjusted w/ stop ASA & decr Xarelto to 15mg  due to severe epistaxis... ~  CXR 3/15 showed cardiomegaly, hyperinflated lungs, biapical pleural thickening, NAD... ~  EKG 3/15 showed AFib, rate74, NSSTTWA... ~  4/15: on Aten50Bid, Diltiazem120, & Lasix20; also taking Xarelto15; BP= 110/64 & she denies CP/ SOB/ edema, notes occas palpit...  HYPERCHOLESTEROLEMIA - on diet + SIMVASTATIN 40mg /d... ~  [place] 2007 showed TChol 160, TG 143, HDL 55, LDL 76 ~  FLP 7/09 showed TChol 165, TG 129, HDL 60, LDL 80 ~  FLP 10/10 showed TChol 175, TG 68, HDL 80, LDL 82 ~  FLP 4/11 showed TChol 207, TG 104, HDL 84, LDL 111... rec better diet, continue same med. ~  FLP 11/11 on Simva40 showed TChol 180, TG 71, HDL 67, LDL 99 ~  FLP 8/12 on Simva40 showed TChol 196, TG 96, HDL 89, LDL 88 ~  FLP 4/13 on Simva40 showed TChol 211, TG 77, HDL 85, LDL 112... rec better diet, same med. ~  FLP 10/14 on Lip40 showed TChol 138, TG 63,  HDL 65, LDL 61   DM - on METFORMIN 500mg - 2tabsBid, GLIPZIDE 10mg Bid (not taking Januvia due to $$)... ~  labs 1/09 showed BS= 148, HgA1c= 7.1.Marland KitchenMarland Kitchen continue meds + better diet... ~  labs 7/09 showed BS= 168, HgA1c= 7.6.Marland KitchenMarland Kitchen incr Metform 2Bid, contin Glip 10Bid & Januv... ~  she stopped the Januvia due to cost... ~  labs 4/10 showed BS= 185, Aic= 8.0.Marland KitchenMarland Kitchen reminder to take meds regularly. ~  labs 10/10 on Metform2Bid+Glip10Bid showed BS= 124, A1c= 6.7 ~  labs 4/11 showed BS= 162, A1c= 6.8.[name]. rec> better diet, take meds regularly. ~  labs 10/11 showed BS= 322, A1c= 8.3.Marland KitchenMarland Kitchen Pharm confirms not taking meds regularly!!! Discussed w/ pt... ~  Labs 4/12 showed BS= 91, A1c= 7.9.Marland KitchenMarland Kitchen Rec> keep same meds, take regularly, better low carb diet... ~  Labs 8/12 on Metform2Bid+Glip10Bid showed BS= 114, A1c= 7.3.Marland KitchenMarland Kitchen Continue same... ~  Labs in hosp 1/13 showed BS~ 153-209 & A1c=7.4... NOTE: she has refused other meds due to $$$ ~  Labs 4/13 on Metform2Bid+Glipiz10Bid showed BS= 208, A1c= 8.6... She is referred to Endocrinology ~  She saw DrEllison but refused Actos, refused Gliptins, etc... ~  Labs 8/13 on Metform2Bid+Glipiz10Bid showed BS= 163, A1c= 7.5.Marland KitchenMarland Kitchen Continue same + better diet... ~  Labs 10/14 on Metform2Bid+Glipiz10Bid showed BS= 181, A1c= 8.8.Marland KitchenMarland Kitchen rec better diet & incr Glipiz10-2Bid... ~  Labs 4/15 on Metform500-2Bid & Glipizide10-2Bid showed BS= 181, A1c= 9.3; she refuses additional meds, may need 70/30 insulin.  HIATAL HERNIA (ICD-553.3) - last EGD by DrStark was 3/03 and showed a HH, reflux...  COLONIC POLYPS (ICD-211.3) - last colonoscopy 3/03 by DrStark showed divertics and 51mm polyp (no path avail).  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 pt reports that there is nothing they can do to help... ~  she has had several UTI's... then perineal rash & saw GYN= neg PAP & Rx yeast infection- resolved. ~  She  will f/u w/ Urology for persistant symptoms, seen by DrEskridge w/ trial Myrbetriq but not much benefit per pt...  DEGENERATIVE JOINT DISEASE (ICD-715.90) - she needs right TKR but wants to put this off as long as poss... ~  4/12:  Ortho eval by DrNorris, hx prev knee arthroscopies, given Cortisone shot, & he plans ?Synvisc series... ~  9/12:  outpt knee surg to repair torn cartilage per DrNorris; preop clearance by DrTaylor & [facility]...  BACK PAIN, LUMBAR (ICD-724.2) - XRays showed scoliosis, facet degen arthritis, & osteopenia...  she has used ROBAXIN Prn... ~  4/12:  She reports eval from DrNorris w/ "arthritis all down my backbone" & he rec epid steroid injection, but she reports improved on NEURONTIN 100mg  Tid... ~  4/13:  She reports that back pain has improved but she still has some leg pain but notes that "it's tolerable"...  SHOULDER PAIN, RIGHT (ICD-719.41) - prev eval & Rx by DrRendall... s/p right shoulder surg 3/09... XRays showed calcium deposit, rotator cuff prob, & intol to Tramadol... ~  She tells me she wants to f/u w/ DrWhitfield for Ortho...  OSTEOPOROSIS (ICD-733.00) ~  labs 10/10 w/ Vit D level = 32... rec> start Vit D OTC 1000 u daily. ~  Labs 4/13 showed Vit D level = 30... rec to incr to 2000u daily...  MEMORY LOSS (ICD-780.93) - concern for her memory expressed 7/09 OV- tried Aricept but she stopped it- "no different". TIA/ INFARCT - she was Hosp 11/11 by DrWillis w/ left sided weakness, ?left facial droop, & MRI showed 2 sm infarcts on right (frontal & ?pontine);  MRA was neg;  CDopplers were neg;  Deficits all cleared rapidly;  2DEcho was abn- see above;  She was continued on her Coumadin Rx- no changes made... ~  4/13:  She was offered memory medications but she declined...  ANXIETY (ICD-300.00) - stress in family... 2 y/o granddau w/ seizures...  VITAMIN B12 DEFICIENCY >> on OTC Vit B-12 tabs orally> 105mcg/d... ~  Labs 7/11 showed Vit V12 level = 135...  Started on Vit B12 supplement w/ 1089mcg/d... ~  Labs 4/13 showed Vit B12 level = 905... Ok top decr to 1/2 tab daily...   Past Surgical History  Procedure Laterality Date  . Cholecystectomy    . Rt.leg surgery  11/2010  . Cataract surgery/both eyes      01/2012 left eye---02/2012 right eye    Outpatient Encounter Prescriptions as of 04/24/2013  Medication Sig  . acetaminophen (TYLENOL) 500 MG tablet Take 1 tablet (500 mg total) by mouth every 6 (six) hours as needed.  [name] atenolol (TENORMIN)  50 MG tablet Take 50 mg by mouth 2 (two) times daily.  [name] atorvastatin (LIPITOR) 40 MG tablet Take 1 tablet (40 mg total) by mouth daily at 6 PM.  . diltiazem (CARDIZEM CD) 120 MG 24 hr capsule Take 1 capsule (120 mg total) by mouth daily.  . furosemide (LASIX) 20 MG tablet Take 1 tablet (20 mg total) by mouth daily.  [name] gabapentin (NEURONTIN) 100 MG capsule Take 1 capsule (100 mg total) by mouth 3 (three) times daily.  [name] glipiZIDE (GLUCOTROL) 10 MG tablet Take 2 tablets by mouth two times daily  . magnesium oxide (MAG-OX) 400 (241.3 MG) MG tablet Take 1 tablet (400 mg total) by mouth 2 (two) times daily.  . megestrol (MEGACE) 40 MG/ML suspension 1 tsp by mouth three times daily before meals  . metFORMIN (GLUCOPHAGE) 500 MG tablet Take 2 tablets (1,000 mg total) by mouth 2 (two) times daily with a meal.  . nitroGLYCERIN (NITROSTAT) 0.4 MG SL tablet Place 1 tablet (0.4 mg total) under the tongue every 5 (five) minutes as needed for chest pain (up to 3 doses).  . Rivaroxaban (XARELTO) 15 MG TABS tablet Take 15 mg by mouth at bedtime.  . traMADol (ULTRAM) 50 MG tablet Take 1 tablet (50 mg total) by mouth 3 (three) times daily as needed.  . vitamin B-12 (CYANOCOBALAMIN) 1000 MCG tablet Take 1,000 mcg by mouth every morning.      Allergies  Allergen Reactions  . Peanut-Containing Drug Products Anaphylaxis and Swelling  . Azithromycin     REACTION: pt states INTOL to ZPak  . Flomax [Tamsulosin Hcl]  Swelling    swelling  . Pioglitazone     edema  . Pregabalin     REACTION: pt states INTOL to Lyrica  . Sulfonamide Derivatives Swelling    REACTION: SWELLING    Review of Systems        See HPI - all other systems neg except as noted... The patient complains of weight loss, chest pain, dyspnea on exertion, and muscle weakness.  The patient denies anorexia, fever, weight gain, vision loss, decreased hearing, hoarseness, syncope, peripheral edema, prolonged cough, headaches, hemoptysis, abdominal pain, melena, hematochezia, severe indigestion/heartburn, hematuria, incontinence, suspicious skin lesions, transient blindness, difficulty walking, depression, unusual weight change, abnormal bleeding, enlarged lymph nodes, and angioedema.     Objective:   Physical Exam      WD, WN, 80 y/o WF in NAD...  GENERAL:  Alert & oriented; pleasant & cooperative... HEENT:  [place]/AT, EOM-wnl, PERRLA, EACs-clear, TMs-wnl, NOSE-clear, THROAT-clear & wnl. NECK:  Supple w/ fairROM; no JVD; normal carotid impulses w/o bruits; no thyromegaly or nodules palpated; no lymphadenopathy. CHEST:  Clear to P & A; without wheezes/ rales/ or rhonchi; +tender left 11th-12th ribs & costal margin. HEART:  Iregular Rhythm= AFib; without murmurs/ rubs/ or gallops heard... ABDOMEN:  Soft & nontender; normal bowel sounds; no organomegaly or masses detected. EXT: without deformities, mild arthritic changes; no varicose veins/ venous insuffic/ or edema. NEURO:  CN's intact;  no focal neuro deficits found... DERM:  No lesions noted; no rash etc...  RADIOLOGY DATA:  Reviewed in the EPIC EMR & discussed w/ the patient...  LABORATORY DATA:  Reviewed in the EPIC EMR & discussed w/ the patient...   Assessment & Plan:    HBP>  Stable on BBlocker, CCB, Lasix20; continue same... SHE DOES NOT KNOW HER MEDS & DID NOT BRING BOTTLES.  CAD>  Hosp 5/14 for NSTEMI=> stent to RCA; Hosp 3/15 w/ NSTEMI &  cath showed widely patent coronaries  & EF=50%...  AFIB>  She had abn 2DEcho in Hosp 11/11 & 1/13- reviewed by Cards- she sees DrTaylor et al & on XARELTO15 due to epistaxis...  CHOL>  FLP looked fair on the Simva40 + diet rx; switched to Lip40 during the 5/14 [facility] for NSTEMI & FLP looks good...  DM>  A1c is 9.3 now on same meds- on max Metform & Glipiz10- 2Bid & she refused additional meds, may need 70/30 insulin...  RENAL INFARCTION>  Adm 1/13 w/ renal infarcts felt due to micro-emboli & inadeq INR ==> changed to XARELTO... URINARY INCONTINENCE>  Followed by Eli Hose now Eskridge & she feels that there is nothing they can do to help her; wears pads; offered second opinion & she will consider it...  ORTHO>  Followed by DrNorris & she is s/p cortisone shots in right knee & ?Synvisc series; she had arthroscopic surg for torn cartilage 9/12 she says & she doesn't want TKR.  STROKE>  She has had a CVA w/ hosp by Neuro 11/11 [facility] records all reviewed);  She was continued on her Coumadin per DrWillis & followed in [facility];  Stable w/o recurrent cerebral ischemic symptoms...  Other medical problems as noted... She will continue the Calcium, MVI, Vit D, & Vit B12 regimen...   Patient's Medications  New Prescriptions   MEGESTROL (MEGACE) 40 MG/ML SUSPENSION    1 tsp by mouth three times daily before meals  Previous Medications   ACETAMINOPHEN (TYLENOL) 500 MG TABLET    Take 1 tablet (500 mg total) by mouth every 6 (six) hours as needed.   ATENOLOL (TENORMIN) 50 MG TABLET    Take 50 mg by mouth 2 (two) times daily.   ATORVASTATIN (LIPITOR) 40 MG TABLET    Take 1 tablet (40 mg total) by mouth daily at 6 PM.   DILTIAZEM (CARDIZEM CD) 120 MG 24 HR CAPSULE    Take 1 capsule (120 mg total) by mouth daily.   FUROSEMIDE (LASIX) 20 MG TABLET    Take 1 tablet (20 mg total) by mouth daily.   GABAPENTIN (NEURONTIN) 100 MG CAPSULE    Take 1 capsule (100 mg total) by mouth 3 (three) times daily.   GLIPIZIDE (GLUCOTROL) 10 MG TABLET    Take 2  tablets by mouth two times daily   MAGNESIUM OXIDE (MAG-OX) 400 (241.3 MG) MG TABLET    Take 1 tablet (400 mg total) by mouth 2 (two) times daily.   METFORMIN (GLUCOPHAGE) 500 MG TABLET    Take 2 tablets (1,000 mg total) by mouth 2 (two) times daily with a meal.   NITROGLYCERIN (NITROSTAT) 0.4 MG SL TABLET    Place 1 tablet (0.4 mg total) under the tongue every 5 (five) minutes as needed for chest pain (up to 3 doses).   RIVAROXABAN (XARELTO) 15 MG TABS TABLET    Take 15 mg by mouth at bedtime.   TRAMADOL (ULTRAM) 50 MG TABLET    Take 1 tablet (50 mg total) by mouth 3 (three) times daily as needed.   VITAMIN B-12 (CYANOCOBALAMIN) 1000 MCG TABLET    Take 1,000 mcg by mouth every morning.   Modified Medications   No medications on file  Discontinued Medications   No medications on file

## 2013-04-25 NOTE — Telephone Encounter (Signed)
PA initiated for the megace.  Waiting for fax.

## 2013-05-02 NOTE — Telephone Encounter (Signed)
Called and spoke with [employer] about the PA for the megace.  This medication has been denied to be covered by her insurance at this time. [employer] mailed a letter to [place] and the pt to make [place] aware.  SN do you want to appeal this decision or would you like to try something else.  Please advise. thanks

## 2013-05-03 ENCOUNTER — Telehealth: Payer: Self-pay | Admitting: *Deleted

## 2013-05-03 NOTE — Telephone Encounter (Signed)
Called and spoke with pt and she is aware that the insurance company denied the megace.  She is going to get some nutritional shakes tomorrow.  Pt is aware to keep her diet up.  Nothing further is needed.

## 2013-05-03 NOTE — Telephone Encounter (Signed)
Per [employer] no PA needed for xarelto 15 mg once daily.

## 2013-05-07 ENCOUNTER — Encounter: Payer: Self-pay | Admitting: Internal Medicine

## 2013-05-07 ENCOUNTER — Ambulatory Visit (INDEPENDENT_AMBULATORY_CARE_PROVIDER_SITE_OTHER): Payer: Medicare Other | Admitting: Internal Medicine

## 2013-05-07 ENCOUNTER — Other Ambulatory Visit: Payer: Medicare Other

## 2013-05-07 VITALS — BP 124/68 | HR 65 | Ht 65.0 in | Wt 110.0 lb

## 2013-05-07 DIAGNOSIS — I5032 Chronic diastolic (congestive) heart failure: Secondary | ICD-10-CM

## 2013-05-07 DIAGNOSIS — R0602 Shortness of breath: Secondary | ICD-10-CM

## 2013-05-07 DIAGNOSIS — I509 Heart failure, unspecified: Secondary | ICD-10-CM

## 2013-05-07 DIAGNOSIS — I4821 Permanent atrial fibrillation: Secondary | ICD-10-CM

## 2013-05-07 DIAGNOSIS — I4891 Unspecified atrial fibrillation: Secondary | ICD-10-CM

## 2013-05-07 DIAGNOSIS — I10 Essential (primary) hypertension: Secondary | ICD-10-CM

## 2013-05-07 DIAGNOSIS — I5042 Chronic combined systolic (congestive) and diastolic (congestive) heart failure: Secondary | ICD-10-CM

## 2013-05-07 MED ORDER — FUROSEMIDE 20 MG PO TABS: 40.0000 mg | ORAL_TABLET | Freq: Every day | ORAL | Status: DC

## 2013-05-07 NOTE — Progress Notes (Signed)
HPI Mrs. Rockholt returns today for followup. She has a h/o chronic atrial fibrillation, HTN, CAD, CHB, s/p PPM insertion. In the interim she has noted increasing dyspnea, fatigue and weakness. No syncope. She has palpitations with exertion. Allergies  Allergen Reactions  . Peanut-Containing Drug Products Anaphylaxis and Swelling  . Azithromycin     REACTION: pt states INTOL to ZPak  . Flomax [Tamsulosin Hcl] Swelling    swelling  . Pioglitazone     edema  . Pregabalin     REACTION: pt states INTOL to Lyrica  . Sulfonamide Derivatives Swelling    REACTION: SWELLING     Current Outpatient Prescriptions  Medication Sig Dispense Refill  . acetaminophen (TYLENOL) 500 MG tablet Take 1 tablet (500 mg total) by mouth every 6 (six) hours as needed.      [name] atenolol (TENORMIN) 50 MG tablet Take 50 mg by mouth 2 (two) times daily.      [name] atorvastatin (LIPITOR) 40 MG tablet Take 1 tablet (40 mg total) by mouth daily at 6 PM.  30 tablet  0  . diltiazem (CARDIZEM CD) 120 MG 24 hr capsule Take 1 capsule (120 mg total) by mouth daily.  30 capsule  6  . furosemide (LASIX) 20 MG tablet Take 1 tablet (20 mg total) by mouth daily.  30 tablet  11  . gabapentin (NEURONTIN) 100 MG capsule Take 1 capsule (100 mg total) by mouth 3 (three) times daily.  90 capsule  5  . glipiZIDE (GLUCOTROL) 10 MG tablet Take 2 tablets by mouth two times daily  120 tablet  6  . magnesium oxide (MAG-OX) 400 (241.3 MG) MG tablet Take 1 tablet (400 mg total) by mouth 2 (two) times daily.  60 tablet  6  . metFORMIN (GLUCOPHAGE) 500 MG tablet Take 2 tablets (1,000 mg total) by mouth 2 (two) times daily with a meal.      . nitroGLYCERIN (NITROSTAT) 0.4 MG SL tablet Place 1 tablet (0.4 mg total) under the tongue every 5 (five) minutes as needed for chest pain (up to 3 doses).  25 tablet  3  . Rivaroxaban (XARELTO) 15 MG TABS tablet Take 15 mg by mouth at bedtime.      . traMADol (ULTRAM) 50 MG tablet Take 1 tablet (50 mg  total) by mouth 3 (three) times daily as needed.  90 tablet  1  . vitamin B-12 (CYANOCOBALAMIN) 1000 MCG tablet Take 1,000 mcg by mouth every morning.        No current facility-administered medications for this visit.     Past Medical History  Diagnosis Date  . HTN (hypertension)   . Pure hypercholesterolemia   . Type II or unspecified type diabetes mellitus without mention of complication, not stated as uncontrolled   . Anxiety state, unspecified   . Diaphragmatic hernia without mention of obstruction or gangrene   . Benign neoplasm of colon   . Mixed incontinence urge and stress (female)(female)   . Osteoarthrosis, unspecified whether generalized or localized, unspecified site   . Lumbago   . Osteoporosis, unspecified   . Memory loss   . Renal infarction     01/2011 in setting of low INR  . History of stroke   . AF (atrial fibrillation)     a. Chronic Xarelto  . CAD (coronary artery disease)     a. 05/2012 NSTEMI/Cath/PCI: LM nl, LAD min irregs, LCX min irregs, RI 40-50 ost, OM1 nl, RCA dom, 3m (4.0x12 Vision  BMS), PD/PL nl, EF 55%, at least mod MR. b. NSTEMI 03/2013 - cath with widely patent coronaries, suspect demand ischemia due to RVR.  . Mitral regurgitation     a. mild by echo 06/2012.  [name] Chronic combined systolic and diastolic CHF (congestive heart failure)     a. 01/2011 Echo: EF 40-45%, diff HK, diast dysfxn, mild to mod MR, mod to sev dil LA/RV/RA, mod to sev reduced RV fxn, mod TR, PASP 80mmHg.;  b.  Echo 06/15/12:  EF 55%, mild MR, severe BAE, mod TR, PASP 35  . Hx of cardiovascular stress test     a. Myoview in 2006 was negative for ischemia, EF 54%. Martin Majestic on to have CAD development 05/2012.)  . Epistaxis     a. s/p cauterization 11/2012.  [name] Hypomagnesemia     ROS:   All systems reviewed and negative except as noted in the HPI.   Past Surgical History  Procedure Laterality Date  . Cholecystectomy    . Rt.leg surgery  11/2010  . Cataract surgery/both eyes       01/2012 left eye---02/2012 right eye     Family History  Problem Relation Age of Onset  . Stomach cancer Father   . Pneumonia Mother   . CAD Brother      History   Social History  . Marital Status: Widowed    Spouse Name: N/A    Number of Children: 6  . Years of Education: N/A   Occupational History  .     Social History Main Topics  . Smoking status: Never Smoker   . Smokeless tobacco: Never Used  . Alcohol Use: No  . Drug Use: No  . Sexual Activity: No   Other Topics Concern  . Not on file   Social History Narrative   1 sister--in poor health   1 brother hx of kidney stones and heart problems     BP 124/68  Pulse 65  Ht 5\' 5"  (1.651 m)  Wt 110 lb (49.896 kg)  BMI 18.31 kg/m2  Physical Exam:  stable appearing elderly woman, NAD HEENT: Unremarkable Neck:  No JVD, no thyromegally Back:  No CVA tenderness Lungs:  Clear with no wheezes HEART:  IRegular rate rhythm, no murmurs, no rubs, no clicks Abd:  soft, positive bowel sounds, no organomegally, no rebound, no guarding Ext:  2 plus pulses, no edema, no cyanosis, no clubbing Skin:  No rashes no nodules Neuro:  CN II through XII intact, motor grossly intact  EKG -atrial fibrillation with a controlled VR    Assess/Plan:

## 2013-05-07 NOTE — Patient Instructions (Signed)
Your physician recommends that you schedule a follow-up appointment will call after echo to determine follow up  Your physician has requested that you have an echocardiogram. Echocardiography is a painless test that uses sound waves to create images of your heart. It provides your doctor with information about the size and shape of your heart and how well your heart's chambers and valves are working. This procedure takes approximately one hour. There are no restrictions for this procedure.   Your physician has recommended you make the following change in your medication:  1) Increase Furosemide to 40mg  daily

## 2013-05-13 ENCOUNTER — Other Ambulatory Visit (HOSPITAL_COMMUNITY): Payer: Medicare Other

## 2013-05-20 ENCOUNTER — Other Ambulatory Visit: Payer: Self-pay | Admitting: Internal Medicine

## 2013-05-20 NOTE — Telephone Encounter (Signed)
Wynetta Emery and [employer] of patient, faxed with confirmation.

## 2013-05-28 ENCOUNTER — Ambulatory Visit (HOSPITAL_COMMUNITY): Payer: Medicare Other | Attending: Internal Medicine | Admitting: Cardiology

## 2013-05-28 DIAGNOSIS — R0609 Other forms of dyspnea: Secondary | ICD-10-CM | POA: Insufficient documentation

## 2013-05-28 DIAGNOSIS — I4891 Unspecified atrial fibrillation: Secondary | ICD-10-CM

## 2013-05-28 DIAGNOSIS — R0602 Shortness of breath: Secondary | ICD-10-CM

## 2013-05-28 DIAGNOSIS — R0989 Other specified symptoms and signs involving the circulatory and respiratory systems: Principal | ICD-10-CM | POA: Insufficient documentation

## 2013-05-28 NOTE — Progress Notes (Signed)
Echo performed. 

## 2013-06-03 NOTE — Assessment & Plan Note (Signed)
Her symptoms appear to have worsened. She may well have developed worsening LV dysfunction and or pulmonary HTN. I have asked the patient to increase her diuretic therapy to 40 mg daily, reduce her sodium intake and undergo repeat 2D echo.

## 2013-06-03 NOTE — Assessment & Plan Note (Signed)
She is systemically anti-coagulated and her ventricular rate appears to be well controlled.

## 2013-06-06 ENCOUNTER — Telehealth: Payer: Self-pay | Admitting: Internal Medicine

## 2013-06-06 NOTE — Telephone Encounter (Signed)
New message  ° ° °Patient calling for test results.   °

## 2013-06-06 NOTE — Telephone Encounter (Signed)
Patient aware of results will see Dr Lovena Le in 3 months

## 2013-07-17 ENCOUNTER — Telehealth: Payer: Self-pay | Admitting: *Deleted

## 2013-07-17 NOTE — Telephone Encounter (Signed)
Patient requests xarelto samples. I will place at the front desk for pick up.

## 2013-07-29 ENCOUNTER — Telehealth: Payer: Self-pay | Admitting: Pulmonary Disease

## 2013-07-29 DIAGNOSIS — R9389 Abnormal findings on diagnostic imaging of other specified body structures: Secondary | ICD-10-CM

## 2013-07-29 NOTE — Telephone Encounter (Signed)
Spoke with the pt  She is asking if SN is familiar with Dr Mindi Junker  She is trying to find a nephrologist and wonders if SN would rec  [place]  Please advise, thanks

## 2013-07-31 NOTE — Telephone Encounter (Signed)
Per SN---  Who does she need and why?  Dr. Mcarthur Rossetti is a retired nephrologist.    Dr. Risa Grill is a current urologist.

## 2013-07-31 NOTE — Telephone Encounter (Signed)
Called spoke with pt. She reports she had MRI of back couple weeks ago. Spot found on kidney.  She reports she ha never seen a urologists that she knows of nor a nephrologists.  Please advise SN thanks

## 2013-07-31 NOTE — Telephone Encounter (Signed)
Per SN---  MRI showed spot on her kidney.  Will need referral to Dr. Kendall Flack for follow up.  thanks

## 2013-07-31 NOTE — Telephone Encounter (Signed)
Called spoke w/ pt. Aware referral placed. Nothing further needed

## 2013-08-01 ENCOUNTER — Telehealth: Payer: Self-pay | Admitting: Pulmonary Disease

## 2013-08-01 NOTE — Telephone Encounter (Signed)
Pt saw Dr. Junious Silk in past and that is who this appointment is scheduled with. Appointment scheduled for Friday 08/02/13 at 3:45. Pt to arrive at 3:30. [address], [address]nd [address]. Pt is aware of appointment date, time and location. Referral faxed to [phone]. [place] made pt aware of above. Nothing further needed

## 2013-08-14 ENCOUNTER — Other Ambulatory Visit: Payer: Self-pay | Admitting: Pulmonary Disease

## 2013-08-14 ENCOUNTER — Other Ambulatory Visit: Payer: Self-pay | Admitting: Internal Medicine

## 2013-09-05 ENCOUNTER — Ambulatory Visit (INDEPENDENT_AMBULATORY_CARE_PROVIDER_SITE_OTHER): Payer: Medicare Other | Admitting: Internal Medicine

## 2013-09-05 ENCOUNTER — Encounter: Payer: Self-pay | Admitting: Internal Medicine

## 2013-09-05 VITALS — BP 148/84 | Ht 65.0 in | Wt 118.0 lb

## 2013-09-05 DIAGNOSIS — I4891 Unspecified atrial fibrillation: Secondary | ICD-10-CM

## 2013-09-05 DIAGNOSIS — I4821 Permanent atrial fibrillation: Secondary | ICD-10-CM

## 2013-09-05 DIAGNOSIS — I340 Nonrheumatic mitral (valve) insufficiency: Secondary | ICD-10-CM

## 2013-09-05 DIAGNOSIS — I059 Rheumatic mitral valve disease, unspecified: Secondary | ICD-10-CM

## 2013-09-05 DIAGNOSIS — I509 Heart failure, unspecified: Secondary | ICD-10-CM

## 2013-09-05 DIAGNOSIS — I5042 Chronic combined systolic (congestive) and diastolic (congestive) heart failure: Secondary | ICD-10-CM

## 2013-09-05 NOTE — Assessment & Plan Note (Signed)
Her symptoms are class II. She appears to be stable at this point on good medical therapy. No change in medications today. She is encouraged to maintain a low-sodium diet.

## 2013-09-05 NOTE — Patient Instructions (Signed)
Your physician wants you to follow-up in: 6 months with Dr Taylor You will receive a reminder letter in the mail two months in advance. If you don't receive a letter, please call our office to schedule the follow-up appointment.  

## 2013-09-05 NOTE — Assessment & Plan Note (Signed)
Her mitral regurgitation is modest. She is not a surgical candidate. She does have moderate to severe tricuspid regurgitation as well. She has no peripheral edema on exam today.

## 2013-09-05 NOTE — Progress Notes (Signed)
HPI Mrs. Otwell returns today for followup. She has a h/o chronic atrial fibrillation, HTN, CAD s/p stent to the RCA in 2014, CHB, s/p PPM insertion.  When I saw her last several months ago, she complained of increasing dyspnea and fatigue. Subsequent evaluation demonstrated moderate mitral regurgitation, severe tricuspid regurgitation, and moderate pulmonary artery hypertension, with pulmonary artery pressures of 50. Our recommendation was to continue diuretic therapy. In the interim, she denies chest pain or sob. SHe has class 2 symptoms. Allergies  Allergen Reactions  . Peanut-Containing Drug Products Anaphylaxis and Swelling  . Azithromycin Other (See Comments)    REACTION: pt states INTOL to ZPak  . Flomax [Tamsulosin Hcl] Swelling  . Pioglitazone Other (See Comments)    edema  . Pregabalin Other (See Comments)    REACTION: pt states INTOL to Lyrica  . Sulfonamide Derivatives Swelling     Current Outpatient Prescriptions  Medication Sig Dispense Refill  . acetaminophen (TYLENOL) 500 MG tablet Take 500 mg by mouth every 6 (six) hours as needed for mild pain.      [name] atenolol (TENORMIN) 50 MG tablet Take 50 mg by mouth 2 (two) times daily.      [name] atorvastatin (LIPITOR) 40 MG tablet TAKE ONE TABLET BY MOUTH ONCE DAILY AT 6PM  30 tablet  6  . diltiazem (CARDIZEM CD) 120 MG 24 hr capsule Take 1 capsule (120 mg total) by mouth daily.  30 capsule  6  . furosemide (LASIX) 20 MG tablet Take 2 tablets (40 mg total) by mouth daily.  60 tablet  11  . gabapentin (NEURONTIN) 100 MG capsule Take 1 capsule (100 mg total) by mouth 3 (three) times daily.  90 capsule  5  . glipiZIDE (GLUCOTROL) 10 MG tablet Take 2 tablets by mouth two times daily  120 tablet  6  . magnesium oxide (MAG-OX) 400 (241.3 MG) MG tablet Take 1 tablet (400 mg total) by mouth 2 (two) times daily.  60 tablet  6  . metFORMIN (GLUCOPHAGE) 500 MG tablet Take 2 tablets (1,000 mg total) by mouth 2 (two) times daily with a  meal.      . nitroGLYCERIN (NITROSTAT) 0.4 MG SL tablet Place 1 tablet (0.4 mg total) under the tongue every 5 (five) minutes as needed for chest pain (up to 3 doses).  25 tablet  3  . Rivaroxaban (XARELTO) 15 MG TABS tablet Take 15 mg by mouth at bedtime.      . traMADol (ULTRAM) 50 MG tablet Take 50 mg by mouth 3 (three) times daily as needed for severe pain.      . vitamin B-12 (CYANOCOBALAMIN) 1000 MCG tablet Take 1,000 mcg by mouth every morning.        No current facility-administered medications for this visit.     Past Medical History  Diagnosis Date  . HTN (hypertension)   . Pure hypercholesterolemia   . Type II or unspecified type diabetes mellitus without mention of complication, not stated as uncontrolled   . Anxiety state, unspecified   . Diaphragmatic hernia without mention of obstruction or gangrene   . Benign neoplasm of colon   . Mixed incontinence urge and stress (female)(female)   . Osteoarthrosis, unspecified whether generalized or localized, unspecified site   . Lumbago   . Osteoporosis, unspecified   . Memory loss   . Renal infarction     01/2011 in setting of low INR  . History of stroke   . AF (atrial  fibrillation)     a. Chronic Xarelto  . CAD (coronary artery disease)     a. 05/2012 NSTEMI/Cath/PCI: LM nl, LAD min irregs, LCX min irregs, RI 40-50 ost, OM1 nl, RCA dom, 61m (4.0x12 Vision BMS), PD/PL nl, EF 55%, at least mod MR. b. NSTEMI 03/2013 - cath with widely patent coronaries, suspect demand ischemia due to RVR.  . Mitral regurgitation     a. mild by echo 06/2012.  [name] Chronic combined systolic and diastolic CHF (congestive heart failure)     a. 01/2011 Echo: EF 40-45%, diff HK, diast dysfxn, mild to mod MR, mod to sev dil LA/RV/RA, mod to sev reduced RV fxn, mod TR, PASP 85mmHg.;  b.  Echo 06/15/12:  EF 55%, mild MR, severe BAE, mod TR, PASP 35  . Hx of cardiovascular stress test     a. Myoview in 2006 was negative for ischemia, EF 54%. Martin Majestic on to have CAD  development 05/2012.)  . Epistaxis     a. s/p cauterization 11/2012.  [name] Hypomagnesemia     ROS:   All systems reviewed and negative except as noted in the HPI.   Past Surgical History  Procedure Laterality Date  . Cholecystectomy    . Rt.leg surgery  11/2010  . Cataract surgery/both eyes      01/2012 left eye---02/2012 right eye     Family History  Problem Relation Age of Onset  . Stomach cancer Father   . Pneumonia Mother   . CAD Brother      History   Social History  . Marital Status: Widowed    Spouse Name: N/A    Number of Children: 6  . Years of Education: N/A   Occupational History  .     Social History Main Topics  . Smoking status: Never Smoker   . Smokeless tobacco: Never Used  . Alcohol Use: No  . Drug Use: No  . Sexual Activity: No   Other Topics Concern  . Not on file   Social History Narrative   1 sister--in poor health   1 brother hx of kidney stones and heart problems     BP 148/84  Ht 5\' 5"  (1.651 m)  Wt 118 lb (53.524 kg)  BMI 19.64 kg/m2  Physical Exam:  stable appearing elderly woman, NAD HEENT: Unremarkable Neck:  No JVD, no thyromegally Back:  No CVA tenderness Lungs:  Clear with no wheezes HEART:  IRegular rate rhythm, no murmurs, no rubs, no clicks Abd:  soft, positive bowel sounds, no organomegally, no rebound, no guarding Ext:  2 plus pulses, no edema, no cyanosis, no clubbing Skin:  No rashes no nodules Neuro:  CN II through XII intact, motor grossly intact  EKG -atrial fibrillation with a controlled VR    Assess/Plan:

## 2013-09-05 NOTE — Assessment & Plan Note (Signed)
Her ventricular rate appears to be well-controlled. No change in medical therapy.

## 2013-10-12 ENCOUNTER — Other Ambulatory Visit: Payer: Self-pay | Admitting: Pulmonary Disease

## 2013-10-12 ENCOUNTER — Other Ambulatory Visit: Payer: Self-pay | Admitting: Internal Medicine

## 2013-10-14 ENCOUNTER — Telehealth: Payer: Self-pay | Admitting: Pulmonary Disease

## 2013-10-14 ENCOUNTER — Other Ambulatory Visit: Payer: Self-pay | Admitting: Pulmonary Disease

## 2013-10-14 NOTE — Telephone Encounter (Signed)
Pt states that she must have misunderstood and she has only been taking Metformin 500mg  tablets in the morning only.  She states she has never taken it twice a day.  Please advise if pt should go ahead and increase dose.  Also pt states her meter broke and needs a new one but [facility] did not have the kind she was used to .  Please recommend a meter to get.  Current Outpatient Prescriptions on File Prior to Visit  Medication Sig Dispense Refill  . acetaminophen (TYLENOL) 500 MG tablet Take 500 mg by mouth every 6 (six) hours as needed for mild pain.      [name] atenolol (TENORMIN) 50 MG tablet Take 50 mg by mouth 2 (two) times daily.      [name] atenolol (TENORMIN) 50 MG tablet TAKE ONE TABLET BY MOUTH TWICE DAILY  60 tablet  1  . atorvastatin (LIPITOR) 40 MG tablet TAKE ONE TABLET BY MOUTH ONCE DAILY AT 6PM  30 tablet  6  . diltiazem (CARDIZEM CD) 120 MG 24 hr capsule Take 1 capsule (120 mg total) by mouth daily.  30 capsule  6  . furosemide (LASIX) 20 MG tablet Take 2 tablets (40 mg total) by mouth daily.  60 tablet  11  . gabapentin (NEURONTIN) 100 MG capsule TAKE ONE CAPSULE BY MOUTH THREE TIMES DAILY  90 capsule  0  . glipiZIDE (GLUCOTROL) 10 MG tablet Take 2 tablets by mouth two times daily  120 tablet  6  . magnesium oxide (MAG-OX) 400 (241.3 MG) MG tablet Take 1 tablet (400 mg total) by mouth 2 (two) times daily.  60 tablet  6  . metFORMIN (GLUCOPHAGE) 500 MG tablet Take 2 tablets (1,000 mg total) by mouth 2 (two) times daily with a meal.      . nitroGLYCERIN (NITROSTAT) 0.4 MG SL tablet Place 1 tablet (0.4 mg total) under the tongue every 5 (five) minutes as needed for chest pain (up to 3 doses).  25 tablet  3  . Rivaroxaban (XARELTO) 15 MG TABS tablet Take 15 mg by mouth at bedtime.      . traMADol (ULTRAM) 50 MG tablet Take 50 mg by mouth 3 (three) times daily as needed for severe pain.      . vitamin B-12 (CYANOCOBALAMIN) 1000 MCG tablet Take 1,000 mcg by mouth every morning.        No current  facility-administered medications on file prior to visit.

## 2013-10-15 NOTE — Telephone Encounter (Signed)
I spoke with the pt and advised her of the directions for her metformin and glipizide. Pt repeated instructions back to me correctly. I also advised her she needs to contact her insurance about what meter they will cover, etc. She states she will do so. [place], CMA

## 2013-10-15 NOTE — Telephone Encounter (Signed)
Per SN---  Whatever her insurance will cover along with the strips.  She will need to check with her insurance company.    Supposed to be on max dose of the only 2 meds that she will purchase due to cost--  Metformin 500 mg  2 po bid= 4 per day Glipizide 10 mg   2 po bid= 4 per day  Will need ROV with SN in the next 1-2 weeks.  thanks

## 2013-10-16 ENCOUNTER — Telehealth: Payer: Self-pay | Admitting: Pulmonary Disease

## 2013-10-16 MED ORDER — GLUCOSE BLOOD VI STRP: ORAL_STRIP | Status: DC

## 2013-10-16 MED ORDER — ONETOUCH ULTRA SYSTEM W/DEVICE KIT: 1.0000 | PACK | Freq: Once | Status: DC

## 2013-10-16 NOTE — Telephone Encounter (Signed)
Called pt. She reports she called [employer] covers BS meters and strips at 100% per pt. She was not told which brand. Spoke with leigh and will send in once touch ultra for pt. Nothing further needed

## 2013-10-18 ENCOUNTER — Telehealth: Payer: Self-pay | Admitting: Pulmonary Disease

## 2013-10-18 MED ORDER — GLUCOSE BLOOD VI STRP: ORAL_STRIP | Status: DC

## 2013-10-18 MED ORDER — ACCU-CHEK AVIVA PLUS W/DEVICE KIT: PACK | Status: DC

## 2013-10-18 NOTE — Telephone Encounter (Signed)
That is fine to hold for now.  Please check sugars daily in am keep log Call if >200 Is she still follow up with Dr. Lenna Gilford  For PCP , if so is due for follow up  If not who is her new PCP will need follow up as DM control has been poor in past.  Please contact office for sooner follow up if symptoms do not improve or worsen or seek emergency care

## 2013-10-18 NOTE — Telephone Encounter (Signed)
Called and spoke with pt and she is aware of TP recs.  appt has been scheduled for her for next Wednesday at 2:30.  Pt is aware.

## 2013-10-18 NOTE — Telephone Encounter (Signed)
Called and spoke with pt and she stated that the metformin dose in the evening  is causing her stomach to hurt and causing her to have diarrhea.  She stated that she is not going to be able to continue to take the last dose of the metformin.  SN is out of the office until Tuesday.  TP please advise. thanks   Called and spoke with [facility] and they stated that the glucose machine that was sent in is not covered by her insurance company.  They will cover accu check device.  This has been sent in to the pharmacy.

## 2013-10-22 ENCOUNTER — Other Ambulatory Visit: Payer: Self-pay | Admitting: Internal Medicine

## 2013-10-23 ENCOUNTER — Ambulatory Visit (INDEPENDENT_AMBULATORY_CARE_PROVIDER_SITE_OTHER): Payer: Medicare Other | Admitting: Pulmonary Disease

## 2013-10-23 ENCOUNTER — Encounter: Payer: Self-pay | Admitting: Pulmonary Disease

## 2013-10-23 VITALS — BP 110/64 | HR 74 | Temp 98.2°F | Ht 65.0 in | Wt 115.6 lb

## 2013-10-23 DIAGNOSIS — IMO0002 Reserved for concepts with insufficient information to code with codable children: Secondary | ICD-10-CM

## 2013-10-23 DIAGNOSIS — I482 Chronic atrial fibrillation: Secondary | ICD-10-CM

## 2013-10-23 DIAGNOSIS — E1140 Type 2 diabetes mellitus with diabetic neuropathy, unspecified: Secondary | ICD-10-CM

## 2013-10-23 DIAGNOSIS — I10 Essential (primary) hypertension: Secondary | ICD-10-CM

## 2013-10-23 DIAGNOSIS — M810 Age-related osteoporosis without current pathological fracture: Secondary | ICD-10-CM

## 2013-10-23 DIAGNOSIS — Z8673 Personal history of transient ischemic attack (TIA), and cerebral infarction without residual deficits: Secondary | ICD-10-CM

## 2013-10-23 DIAGNOSIS — I4821 Permanent atrial fibrillation: Secondary | ICD-10-CM

## 2013-10-23 DIAGNOSIS — N3946 Mixed incontinence: Secondary | ICD-10-CM

## 2013-10-23 DIAGNOSIS — F411 Generalized anxiety disorder: Secondary | ICD-10-CM

## 2013-10-23 DIAGNOSIS — M5441 Lumbago with sciatica, right side: Secondary | ICD-10-CM

## 2013-10-23 DIAGNOSIS — D126 Benign neoplasm of colon, unspecified: Secondary | ICD-10-CM

## 2013-10-23 DIAGNOSIS — Z23 Encounter for immunization: Secondary | ICD-10-CM

## 2013-10-23 DIAGNOSIS — M150 Primary generalized (osteo)arthritis: Secondary | ICD-10-CM

## 2013-10-23 DIAGNOSIS — E1165 Type 2 diabetes mellitus with hyperglycemia: Secondary | ICD-10-CM

## 2013-10-23 DIAGNOSIS — R413 Other amnesia: Secondary | ICD-10-CM

## 2013-10-23 DIAGNOSIS — R269 Unspecified abnormalities of gait and mobility: Secondary | ICD-10-CM

## 2013-10-23 DIAGNOSIS — I5042 Chronic combined systolic (congestive) and diastolic (congestive) heart failure: Secondary | ICD-10-CM

## 2013-10-23 DIAGNOSIS — K449 Diaphragmatic hernia without obstruction or gangrene: Secondary | ICD-10-CM

## 2013-10-23 DIAGNOSIS — M159 Polyosteoarthritis, unspecified: Secondary | ICD-10-CM

## 2013-10-23 DIAGNOSIS — I2510 Atherosclerotic heart disease of native coronary artery without angina pectoris: Secondary | ICD-10-CM

## 2013-10-23 NOTE — Patient Instructions (Signed)
Today we updated your med list in our EPIC system...    Continue your current medications the same...  We gave you the 2015 Flu vaccine today...  Please return to our lab one morning soon for your FASTING blood work... This is very important so we can determine if you need to start on an insulin shot daily...    We will contact you w/ the results when available...   Call for any questions...  Let's plan a follow up visit in 35mo, sooner if needed for problems.Marland KitchenMarland Kitchen

## 2013-10-23 NOTE — Progress Notes (Addendum)
Subjective:    Patient ID: Michelle Herrera, female    DOB: [birth year], 80 y.o.   MRN: [id number]  HPI 80 y/o WF here for a follow up visit... SEE PREV EPIC NOTES FOR OLDER DATA >>   ~  October 18, 2012:  248moROV & Michelle Herrera is here for a routine f/u, notes her memory is slipping & we discussed starting Aricept5 but she declines;  We reviewed the following medical problems during today's office visit >>     HBP> on Aten50Bid, Losar25,  & Lasix20prn; BP= 134/80 & she denies CP/ SOB/ edema, notes occas palpit...    CAD, Cardiomyopathy (sys & diast CHF), Valvular Dis w/ MR> on ASA81, Xarelto, statin, etc; Adm 5/14 by [facility] w/ NSTEMI, Cath=> stent to 95%RCA; followed by DrTaylor...    AFib> on Xarelto20 (samples from [facility]); followed by DrTaylor & cardiology notes are reviewed...    Chol> on Lip40 since 5/14; weight is stable 130#, BMI=22; FLP 10/14 shows TChol 138, TG 63, HDL 65, LDL 61    DM w/ neuropathy> on Metform500-2Bid, Glipiz10Bid; followed by DrEllison & last seen 6/13- Actos stopped due to edema; she claims that BS higher when her knee is hurting; Labs 10/14 shows BS=181, A1c=8.8 & we decided to incr Glipiz10-2Bid...    GI- HH, colon polyps, s/p GB> stable she says, not on regular PPI dosing, last colon was 2003 by DrStark w/ 531mpolyp & divertics; denies abd pain, n/v, d/c, blood seen...    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    DJD, LBP, right shoulder pain> on Neurontin100Tid; Ortho evals from DrNorris- prev knee surg for torn cartilage, back discomfort is improved she says...    Osteoporosis, Vit D defic> Vit D level 2013 = 30 and she was asked to incr her OTC Vit D supplement to 2000u daily...    Memory Loss> Hx 2 sm infarcts on prev MRI, on ASA/ Xarelto from [facility]; she declines Aricept etc...    Anxiety> mod stress in the family, she declines anxiolytic meds...    B12 defic> on OTC  oral B12 supplement 1000u daily... We reviewed prob list, meds, xrays and labs> see below for updates >> OK Flu shot today....  LABS 10/14:  FLP- looks good on Lip40;  Chems- ok x BS=181, A1c=8.8...[name] ~  April 24, 2013:  48m60moV & Michelle Herrera has had an eventful interval> followed by DrTaylor for Cards w/ AFib, CAD, biventric failure, pulmHTN;  Episode severe epistaxis 11/14 requiring electocautery & ASA was stopped & Xarelto was decreased to 10m71m  She was Hosp by [facility] 1/11 - 01/22/13 w/ incr SOB & palpit due to AFib w/ RVR and DiastolicCHF, she was diuresed, meds adjusted, and improved;  She was again Hosp 3/3 - 03/15/13 by [facility] w/ CP, AFib & RVR, NSTEMI likely from demand ischemia since cath showed widely patent coronaries, EF=50% w/ mild inferior wall HK;  She had f/u w/ [name] 3/15 & doing satis...  We reviewed the following medical problems during today's office visit >>     HBP> on Aten50Bid, Diltiazem120,  & Lasix20; BP= 110/64 & she denies CP/ SOB/ edema, notes occas palpit...    CAD, Cardiomyopathy (sys & diast CHF), Valvular Dis w/ MR> off ASA81 due to epistaxis, on Xarelto15, Aten50Bid, Diltiazem120, Lasix20; Adm 5/14 by [facility] w/ NSTEMI, Cath=> stent to 95%RCA; followed by DrTaylor; Adm 1/15 & 3/15 w/ AFib, RVR, NSTEMI  due to demand ischemia and recath w/ widely patent coronaries, EF=50%; meds adjusted...    AFib> on Xarelto15 due to epistaxis; followed by DrTaylor & cardiology notes are reviewed...    Chol> on Lip40; weight is down to 109#, BMI=18; FLP 10/14 shows TChol 138, TG 63, HDL 65, LDL 61    DM w/ neuropathy> on Metform500-2Bid, Glipiz10-2Bid; prev followed by DrEllison but last seen 6/13- Actos stopped due to edema; won't take additional meds due to cost; she claims that BS higher when her knee is hurting; Labs 10/14 showed BS=181, A1c=8.8 & Labs 4/15 showed BS=181, A1c=9.3;  I called pharm [facility] on [name]- she has filled #120 of each in March & April=> continue same & better  low carb diet (may need the inexpensive 70/30 insulin)    GI- HH, colon polyps, s/p GB> stable she says, not on regular PPI dosing, last colon was 2003 by DrStark w/ 32m polyp & divertics; denies abd pain, n/v, d/c, blood seen...    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    DJD, LBP, right shoulder pain> on Neurontin100Tid; Ortho evals from DrNorris- prev knee surg for torn cartilage, back discomfort is improved she says...    Osteoporosis, Vit D defic> Vit D level 2013 = 30 and she was asked to incr her OTC Vit D supplement to 2000u daily...    Memory Loss> Hx 2 sm infarcts on prev MRI, on Xarelto from [facility]; she declines Aricept etc...    Anxiety> mod stress in the family, she declines anxiolytic meds...    B12 defic> on OTC oral B12 supplement 1000u daily... We reviewed prob list, meds, xrays and labs> see below for updates >> SHE DID NOT BRING MED BOTTLES & DOES NOT KNOW HER MEDS!!!  LABS 4/15:  Chems- ok x BS=181, A1c=9.3;  LFTs- wnl;  CBC- wnl;  Fe=121 (31%sat);    ADDENDUM>> she notes that she has no appetite & "not eating"; she has lost 21# in 6245moown to 109# & BMI=18; but serum proteins WNL; we will treat w/ MEGACE 20023mefore meals Tid...  ~  October 23, 2013:  45mo38mo & Michelle Herrera reports that she is feeling well, no new complaints or concerns;  Over the 45mo 70morval however she has decreased both of her DM meds in half as she says she doesn't tolerate the max doses due to "feeling bad, GI upset, & diarrhea";  She has also seen DrWhitfield for back & side pain- she reports he did XRays but doesn't know what it is & we don't have his notes to review; she gets relief w/ Tramadol but she states 50mg 56mgives her diarrhea & she is rec to take 1/2 tab w/ an ESTylenol... We reviewed the following medical problems during today's office visit >>     HBP> on Aten50Bid, Diltiazem120,  & Lasix20-2Qam;  BP= 110/64 & she denies CP/ SOB/ edema, notes occas palpit...    CAD, Cardiomyopathy (sys & diast CHF), Valvular Dis w/ MR> off ASA81 due to epistaxis, on Xarelto15 (lowered dose due to nose bleed) & above meds; Adm 5/14 by [facility] w/ NSTEMI, Cath=> stent to 95%RCA; followed by DrTaylor; Adm 1/15 & 3/15 w/ AFib, RVR, NSTEMI due to demand ischemia and recath w/ widely patent coronaries, EF=50%; meds adjusted...    AFib, CHB & Pacer> on Xarelto15 due to epistaxis; followed by DrTaylor & seen 8/15> cardiology notes are reviewed...Marland KitchenMarland Kitchen  Chol> on Lip40; weight is up to 116#, BMI=19; FLP 10/15 shows TChol 127, TG 54, HDL 69, LDL 47    DM w/ neuropathy> prev on Metform500-2Bid & Glipiz10-2Bid but INTOL she says & decreased to 2 of each Qam only; prev followed by DrEllison but last seen 6/13- Actos stopped due to edema; won't take additional meds due to cost; refuses insulin shots; she claims that BS higher when her knee is hurting; Labs 10/15 showed BS=172, A1c=8.4 (sl improved) & asked to ADD Metform500 & Glipiz10 (one of each) in PM at dinner...     GI- HH, colon polyps, s/p GB> stable she says, not on regular PPI dosing, last colon was 2003 by DrStark w/ 35m polyp & divertics; denies abd pain, n/v, d/c, blood seen...    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     DJD, LBP, right shoulder pain> on Neurontin100Tid; Ortho evals from DrNorris/ Whitfield- prev knee surg for torn cartilage, back discomfort treated w/ Tramadol & Tylenol...    Osteoporosis, Vit D defic> Vit D level 2013 = 30 and she was asked to incr her OTC Vit D supplement to 2000u daily; needs f/u BMD but she wants to wait...    Memory Loss> Hx 2 sm infarcts on prev MRI, on Xarelto from [facility]; she declines Aricept etc...     Anxiety> mod stress in the family, she declines anxiolytic meds...    B12 defic> on OTC oral B12 supplement 1000u daily... We reviewed prob list, meds, xrays and labs> see below for updates >> Given 2015 Flu vaccine today; she did not bring meds or med list to office visit today!  LABS 10/15:  FLP- looks great on Lip40;  Chems- BS=172, A1c=8.4 on her Metform500-2Qam & glipiz10-2Qam... PLAN>>  Rec to increase her DM meds to include one of each at dinner time in PM, plus diet, exercise, etc...            Problem List:     HYPERTENSION >> VALVULAR HEART DISEASE >> ATRIAL FIBRILLATION - stable on Rx w/ XARELTO 248md & rate control strategy. ~  4/11:  she reports that she stopped her Lanoxin 0.125 on her own & doesn't want to restart. ~  8/11:  f/u DrTaylor & stable on Coumadin w/ rate control strategy, no changes made. ~  11/11:  Hosp by Neuro w/ TIA> 2DEcho showed mild LVH, diffuse HK w/ EF= 45%, thick pos MV leaflet w/ restrict motion & modMR, dilated LA&RA, severeTR & PAsys=49, can't r/o PFO... ~  8/12:  her BP=116/84 and she feels OK- denies HA, fatigue, visual changes, CP, palipit, dizziness, syncope, dyspnea, edema, etc... ~  12/12:  BP= 126/88 and she remains asymptomatic as above... ~  1/13:  2DEcho showed mild LVH w/ EF= 40-45% & diffuse HK, Diastolic dysfunction, modMR, severe LAE, modTR, Pasys=45 ~  [facility] due to the renal infarcts & difficulty maintaining INR. ~  CXR 1/13 showed marked cardiomegaly, pulm vasc congestion, NAD... ~  4/13:  BP= 128/60 and she is feeling better she says;  BUN=15, Creat=0.8; She had f/u DrTaylor> noted to be doing well w/o symptoms- continue same meds & low Na. ~  EKG 4/13 showed AFib, rate 77, right ward axis & NSSTTWA... ~  8/13:  BP= 110/82  and she denies current CP, palpit, SOB, edema... ~  4/14: on Aten50Bid; BP= 130/84 & she denies CP/ SOB/ edema, notes occas palpit. ~  5/14:  Adm by Cards> NSTEMI, Cath w/  LAD minor irreg only, LCirc w/ ostial 40-50%, mid RCA 95%, EF 55%. PCI: Vision BMS to the mid RCA. There was moderate MR on left ventriculogram;  ~  CXR 5/14 showed cardiomeg, underlying COPD w/ apical scarring, DJD in spine, NAD... ~  EKG 6/14 showed AFib, rate72, NSSTTWA...  ~  2DEcho 6/14 showed norm LV size & funct w/ EF=55%, mild MR, severe RA&LA dil, modTR,  ~  Adm 1/15 & 3/15 w/ CP, AFib w/ RVR, had NSTEMI 3/15 from demand ischemia; cath w/ patent coronaries, EF=50%; meds adjusted w/ stop ASA & decr Xarelto to 55m due to severe epistaxis... ~  CXR 3/15 showed cardiomegaly, hyperinflated lungs, biapical pleural thickening, NAD... ~  EKG 3/15 showed AFib, rate74, NSSTTWA... ~  4/15: on Aten50Bid, Diltiazem120, & Lasix20; also taking Xarelto15; BP= 110/64 & she denies CP/ SOB/ edema, notes occas palpit... ~  10/15:  on Aten50Bid, Diltiazem120,  & Lasix20-2Qam; BP= 110/64 & she claims asymptomatic...  HYPERCHOLESTEROLEMIA - on diet + SIMVASTATIN 418md... ~  FLWhite Signal007 showed TChol 160, TG 143, HDL 55, LDL 76 ~  FLP 7/09 showed TChol 165, TG 129, HDL 60, LDL 80 ~  FLP 10/10 showed TChol 175, TG 68, HDL 80, LDL 82 ~  FLP 4/11 showed TChol 207, TG 104, HDL 84, LDL 111... rec better diet, continue same med. ~  FLP 11/11 on Simva40 showed TChol 180, TG 71, HDL 67, LDL 99 ~  FLP 8/12 on Simva40 showed TChol 196, TG 96, HDL 89, LDL 88 ~  FLP 4/13 on Simva40 showed TChol 211, TG 77, HDL 85, LDL 112... rec better diet, same med. ~  FLP 10/14 on Lip40 showed TChol 138, TG 63, HDL 65, LDL 61  ~  FLP 10/15 on Lip40 showed  TChol 127, TG 54, HDL 69, LDL 47  TYPE 2 DIABETES MELLITUS, Uncontrolled, w/ Neuropathy >>  ~  on METFORMIN 5004m2tabsBid, GLIPZIDE 31m35m (not taking Januvia due to $$)... ~  labs 1/09 showed BS= 148, HgA1c= 7.1... cMarland KitchenMarland Kitchentinue meds + better diet... ~  labs 7/09 showed BS= 168, HgA1c= 7.6... iMarland KitchenMarland Kitchenr Metform 2Bid, contin Glip 10Bid & Januv... ~  she stopped the Januvia due to  cost... ~  labs 4/10 showed BS= 185, Aic= 8.0... rMarland KitchenMarland Kitcheninder to take meds regularly. ~  labs 10/10 on Metform2Bid+Glip10Bid showed BS= 124, A1c= 6.7 ~  labs 4/11 showed BS= 162, A1c= 6.8... rMarland Kitchenc> better diet, take meds regularly. ~  labs 10/11 showed BS= 322, A1c= 8.3... PMarland KitchenMarland Kitchenrm confirms not taking meds regularly!!! Discussed w/ pt... ~  Labs 4/12 showed BS= 91, A1c= 7.9... RMarland KitchenMarland Kitchen> keep same meds, take regularly, better low carb diet... ~  Labs 8/12 on Metform2Bid+Glip10Bid showed BS= 114, A1c= 7.3... CMarland KitchenMarland Kitchentinue same... ~  Labs in hosp 1/13 showed BS~ 153-209 & A1c=7.4... NOTE: she has refused other meds due to $$$ ~  Labs 4/13 on Metform2Bid+Glipiz10Bid showed BS= 208, A1c= 8.6... She is referred to Endocrinology ~  She saw DrEllison but refused Actos, refused Gliptins, etc... ~  Labs 8/13 on Metform2Bid+Glipiz10Bid showed BS= 163, A1c= 7.5... CMarland KitchenMarland Kitchentinue same + better diet... ~  Labs 10/14 on Metform2Bid+Glipiz10Bid showed BS= 181, A1c= 8.8... rMarland KitchenMarland Kitchen better diet & incr Glipiz10-2Bid... ~  Labs 4/15 on Metform500-2Bid & Glipizide10-2Bid showed BS= 181, A1c= 9.3; she refuses additional meds;  she cut back both meds to just Qam due to "side effects"... ~  Labs 10/15 on Metform500-2Qam & Glipiz10-2Qam showed BS= 172, A1c= 8.4 and REC to add one of each at dinner in the PM...   HIATAL HERNIA (ICD-553.3) - last EGD by DrStark was 3/03 and showed a HH, reflux...  COLONIC POLYPS (ICD-211.3) - last colonoscopy 3/03 by DrStark showed divertics and 81m polyp (no path avail).  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 pt reports that there is nothing they can do to help... ~  she has had several UTI's... then perineal rash & saw GYN= neg PAP & Rx yeast infection- resolved. ~  She will f/u w/ Urology for persistant symptoms, seen by DrEskridge w/ trial Myrbetriq but not much benefit per pt... ~  7/15: referred to DrEskridge  by Ortho after MRI spine showed ?bilat hydro; reviewed by Urology & they disagreed- no hydro but she has hx renal infarct 1/13, bilat extrarenal pelvis, & left peri-pelvic cysts, small bladder capacity, known cystocele...  DEGENERATIVE JOINT DISEASE (ICD-715.90) - she needs right TKR but wants to put this off as long as poss... ~  4/12:  Ortho eval by DrNorris, hx prev knee arthroscopies, given Cortisone shot, & he plans ?Synvisc series... ~  9/12:  outpt knee surg to repair torn cartilage per DrNorris; preop clearance by DrTaylor & [facility]...  BACK PAIN, LUMBAR (ICD-724.2) - XRays showed scoliosis, facet degen arthritis, & osteopenia...  she has used ROBAXIN Prn... ~  4/12:  She reports eval from DrNorris w/ "arthritis all down my backbone" & he rec epid steroid injection, but she reports improved on NEURONTIN 1088mTid... ~  4/13:  She reports that back pain has improved but she still has some leg pain but notes that "it's tolerable"...  SHOULDER PAIN, RIGHT (ICD-719.41) - prev eval & Rx by DrRendall... s/p right shoulder surg 3/09... XRays showed calcium deposit, rotator cuff prob, & intol to Tramadol... ~  She tells me she wants to f/u w/ DrWhitfield for Ortho...  OSTEOPOROSIS (ICD-733.00) ~  labs 10/10 w/ Vit D level = 32... rec> start Vit D OTC 1000 u daily. ~  Labs 4/13 showed Vit D level = 30... rec to incr to 2000u daily...  MEMORY LOSS (ICD-780.93) - concern for her memory expressed 7/09 OV- tried Aricept but she stopped it- "no different". TIA/ INFARCT - she was Hosp 11/11 by DrWillis w/ left sided weakness, ?left facial droop, & MRI showed 2 sm infarcts on right (frontal & ?pontine);  MRA was neg;  CDopplers were neg;  Deficits all cleared rapidly;  2DEcho was abn- see above;  She was continued on her Coumadin Rx- no changes made... ~  4/13:  She was offered memory medications but she declined...  ANXIETY (ICD-300.00) - stress in family... 4 y/o granddau w/  seizures...  VITAMIN B12 DEFICIENCY >> on OTC Vit B-12 tabs orally> 10003md... ~  Labs 7/11 showed Vit V12 level = 135... Started on Vit B12 supplement w/ 1000m57m... ~  Labs 4/13 showed Vit B12 level = 905... Ok top decr to 1/2 tab daily...   Past Surgical History  Procedure Laterality Date  . Cholecystectomy    . Rt.leg surgery  11/2010  . Cataract surgery/both eyes      01/2012 left eye---02/2012 right eye    Outpatient Encounter Prescriptions as of 10/23/2013  Medication Sig  . acetaminophen (TYLENOL) 500 MG tablet Take 500  mg by mouth every 6 (six) hours as needed for mild pain.  [name] atenolol (TENORMIN) 50 MG tablet TAKE ONE TABLET BY MOUTH TWICE DAILY  . atorvastatin (LIPITOR) 40 MG tablet TAKE ONE TABLET BY MOUTH ONCE DAILY AT 6PM  . Blood Glucose Monitoring Suppl (ACCU-CHEK AVIVA PLUS) W/DEVICE KIT Test blood sugar once daily  . diltiazem (CARDIZEM CD) 120 MG 24 hr capsule TAKE ONE CAPSULE BY MOUTH ONCE DAILY  . furosemide (LASIX) 20 MG tablet Take 2 tablets (40 mg total) by mouth daily.  [name] gabapentin (NEURONTIN) 100 MG capsule TAKE ONE CAPSULE BY MOUTH THREE TIMES DAILY  . glipiZIDE (GLUCOTROL) 10 MG tablet Take 2 tablets by mouth every morning  . glucose blood (ACCU-CHEK AVIVA) test strip Use as instructed  . glucose blood test strip Use as instructed  . magnesium oxide (MAG-OX) 400 (241.3 MG) MG tablet Take 1 tablet (400 mg total) by mouth 2 (two) times daily.  . metFORMIN (GLUCOPHAGE) 500 MG tablet TAKE TWO TABLETS BY MOUTH once  DAILY WITH MEALS  . nitroGLYCERIN (NITROSTAT) 0.4 MG SL tablet Place 1 tablet (0.4 mg total) under the tongue every 5 (five) minutes as needed for chest pain (up to 3 doses).  . Rivaroxaban (XARELTO) 15 MG TABS tablet Take 15 mg by mouth at bedtime.  . traMADol (ULTRAM) 50 MG tablet Take 50 mg by mouth 3 (three) times daily as needed for severe pain.  . vitamin B-12 (CYANOCOBALAMIN) 1000 MCG tablet Take 1,000 mcg by mouth every morning.   .  [DISCONTINUED] glipiZIDE (GLUCOTROL) 10 MG tablet Take 2 tablets by mouth two times daily  . [DISCONTINUED] metFORMIN (GLUCOPHAGE) 500 MG tablet TAKE TWO TABLETS BY MOUTH TWICE DAILY WITH MEALS  . [DISCONTINUED] atenolol (TENORMIN) 50 MG tablet Take 50 mg by mouth 2 (two) times daily.  . [DISCONTINUED] metFORMIN (GLUCOPHAGE) 500 MG tablet Take 2 tablets (1,000 mg total) by mouth 2 (two) times daily with a meal.     Allergies  Allergen Reactions  . Peanut-Containing Drug Products Anaphylaxis and Swelling  . Azithromycin Other (See Comments)    REACTION: pt states INTOL to ZPak  . Flomax [Tamsulosin Hcl] Swelling  . Pioglitazone Other (See Comments)    edema  . Pregabalin Other (See Comments)    REACTION: pt states INTOL to Lyrica  . Sulfonamide Derivatives Swelling    Review of Systems        See HPI - all other systems neg except as noted... The patient complains of weight loss, chest pain, dyspnea on exertion, and muscle weakness.  The patient denies anorexia, fever, weight gain, vision loss, decreased hearing, hoarseness, syncope, peripheral edema, prolonged cough, headaches, hemoptysis, abdominal pain, melena, hematochezia, severe indigestion/heartburn, hematuria, incontinence, suspicious skin lesions, transient blindness, difficulty walking, depression, unusual weight change, abnormal bleeding, enlarged lymph nodes, and angioedema.     Objective:   Physical Exam      WD, WN, 80 y/o WF in NAD...  GENERAL:  Alert & oriented; pleasant & cooperative... HEENT:  [place]/AT, EOM-wnl, PERRLA, EACs-clear, TMs-wnl, NOSE-clear, THROAT-clear & wnl. NECK:  Supple w/ fairROM; no JVD; normal carotid impulses w/o bruits; no thyromegaly or nodules palpated; no lymphadenopathy. CHEST:  Clear to P & A; without wheezes/ rales/ or rhonchi; +tender left 11th-12th ribs & costal margin. HEART:  Iregular Rhythm= AFib; without murmurs/ rubs/ or gallops heard... ABDOMEN:  Soft & nontender; normal bowel sounds;  no organomegaly or masses detected. EXT: without deformities, mild arthritic changes; no varicose veins/ venous insuffic/  or edema. NEURO:  CN's intact;  no focal neuro deficits found... DERM:  No lesions noted; no rash etc...  RADIOLOGY DATA:  Reviewed in the EPIC EMR & discussed w/ the patient...  LABORATORY DATA:  Reviewed in the EPIC EMR & discussed w/ the patient...   Assessment & Plan:    HBP>  Stable on BBlocker, CCB, Lasix20; continue same... SHE DOES NOT KNOW HER MEDS & DID NOT BRING BOTTLES.  CAD>  Hosp 5/14 for NSTEMI=> stent to RCA; Hosp 3/15 w/ NSTEMI & cath showed widely patent coronaries & EF=50%...  AFIB>  She had abn 2DEcho in Hosp 11/11 & 1/13- reviewed by Cards- she sees DrTaylor et al & on XARELTO15 due to epistaxis...  CHOL>  FLP looked fair on the Simva40 + diet rx; switched to Lip40 during the 5/14 [facility] for NSTEMI & FLP looks good...  DM>  A1c is 8.4 now on Metform500-2Qam & Glipiz10-2Qam & she refused additional meds, REC to add one of each in the PM at dinner...  RENAL INFARCTION>  Adm 1/13 w/ renal infarcts felt due to micro-emboli & inadeq INR ==> changed to XARELTO... URINARY INCONTINENCE>  Followed by Eli Hose now Eskridge & she feels that there is nothing they can do to help her; wears pads; offered second opinion & she will consider it...  ORTHO>  Followed by DrNorris/ [facility] & she is s/p cortisone shots in right knee & ?Synvisc series; she had arthroscopic surg for torn cartilage 9/12 she says & she doesn't want TKR.  STROKE>  She has had a CVA w/ hosp by Neuro [facility]);  She was continued on her Coumadin per DrWillis & followed in [facility];  Stable w/o recurrent cerebral ischemic symptoms...  Other medical problems as noted... She will continue the Calcium, MVI, Vit D, & Vit B12 regimen...   Patient's Medications  New Prescriptions   No medications on file  Previous Medications   ACETAMINOPHEN (TYLENOL) 500 MG TABLET     Take 500 mg by mouth every 6 (six) hours as needed for mild pain.   ATENOLOL (TENORMIN) 50 MG TABLET    TAKE ONE TABLET BY MOUTH TWICE DAILY   ATORVASTATIN (LIPITOR) 40 MG TABLET    TAKE ONE TABLET BY MOUTH ONCE DAILY AT 6PM   BLOOD GLUCOSE MONITORING SUPPL (ACCU-CHEK AVIVA PLUS) W/DEVICE KIT    Test blood sugar once daily   DILTIAZEM (CARDIZEM CD) 120 MG 24 HR CAPSULE    TAKE ONE CAPSULE BY MOUTH ONCE DAILY   FUROSEMIDE (LASIX) 20 MG TABLET    Take 2 tablets (40 mg total) by mouth daily.   GABAPENTIN (NEURONTIN) 100 MG CAPSULE    TAKE ONE CAPSULE BY MOUTH THREE TIMES DAILY   GLUCOSE BLOOD (ACCU-CHEK AVIVA) TEST STRIP    Use as instructed   GLUCOSE BLOOD TEST STRIP    Use as instructed   MAGNESIUM OXIDE (MAG-OX) 400 (241.3 MG) MG TABLET    Take 1 tablet (400 mg total) by mouth 2 (two) times daily.   NITROGLYCERIN (NITROSTAT) 0.4 MG SL TABLET    Place 1 tablet (0.4 mg total) under the tongue every 5 (five) minutes as needed for chest pain (up to 3 doses).   RIVAROXABAN (XARELTO) 15 MG TABS TABLET    Take 15 mg by mouth at bedtime.   TRAMADOL (ULTRAM) 50 MG TABLET    Take 50 mg by mouth 3 (three) times daily as needed for severe pain.   VITAMIN B-12 (  CYANOCOBALAMIN) 1000 MCG TABLET    Take 1,000 mcg by mouth every morning.   Modified Medications   Modified Medication Previous Medication   GLIPIZIDE (GLUCOTROL) 10 MG TABLET glipiZIDE (GLUCOTROL) 10 MG tablet      Take 2 tablets by mouth every morning    Take 2 tablets by mouth two times daily   METFORMIN (GLUCOPHAGE) 500 MG TABLET metFORMIN (GLUCOPHAGE) 500 MG tablet      TAKE TWO TABLETS BY MOUTH once  DAILY WITH MEALS    TAKE TWO TABLETS BY MOUTH TWICE DAILY WITH MEALS  Discontinued Medications   ATENOLOL (TENORMIN) 50 MG TABLET    Take 50 mg by mouth 2 (two) times daily.   METFORMIN (GLUCOPHAGE) 500 MG TABLET    Take 2 tablets (1,000 mg total) by mouth 2 (two) times daily with a meal.

## 2013-10-24 ENCOUNTER — Other Ambulatory Visit: Payer: Self-pay | Admitting: Pulmonary Disease

## 2013-10-24 DIAGNOSIS — E1165 Type 2 diabetes mellitus with hyperglycemia: Principal | ICD-10-CM

## 2013-10-24 DIAGNOSIS — IMO0002 Reserved for concepts with insufficient information to code with codable children: Secondary | ICD-10-CM

## 2013-10-24 DIAGNOSIS — E1140 Type 2 diabetes mellitus with diabetic neuropathy, unspecified: Secondary | ICD-10-CM

## 2013-10-24 MED ORDER — ACCU-CHEK AVIVA PLUS W/DEVICE KIT: PACK | Status: DC

## 2013-11-04 ENCOUNTER — Other Ambulatory Visit (INDEPENDENT_AMBULATORY_CARE_PROVIDER_SITE_OTHER): Payer: Medicare Other

## 2013-11-04 DIAGNOSIS — I10 Essential (primary) hypertension: Secondary | ICD-10-CM

## 2013-11-04 DIAGNOSIS — E1165 Type 2 diabetes mellitus with hyperglycemia: Secondary | ICD-10-CM

## 2013-11-04 DIAGNOSIS — E1140 Type 2 diabetes mellitus with diabetic neuropathy, unspecified: Secondary | ICD-10-CM

## 2013-11-04 DIAGNOSIS — K449 Diaphragmatic hernia without obstruction or gangrene: Secondary | ICD-10-CM

## 2013-11-04 DIAGNOSIS — IMO0002 Reserved for concepts with insufficient information to code with codable children: Secondary | ICD-10-CM

## 2013-11-04 LAB — LIPID PANEL
CHOL/HDL RATIO: 2
Cholesterol: 127 mg/dL (ref 0–200)
HDL: 68.8 mg/dL (ref >39.00)
LDL Cholesterol: 47 mg/dL (ref 0–99)
NonHDL: 58.2
TRIGLYCERIDES: 54 mg/dL (ref 0.0–149.0)
VLDL: 10.8 mg/dL (ref 0.0–40.0)

## 2013-11-04 LAB — BASIC METABOLIC PANEL
BUN: 18 mg/dL (ref 6–23)
CHLORIDE: 101 meq/L (ref 96–112)
CO2: 29 meq/L (ref 19–32)
CREATININE: 0.8 mg/dL (ref 0.4–1.2)
Calcium: 9.6 mg/dL (ref 8.4–10.5)
GFR: 70.1 mL/min (ref >60.00)
Glucose, Bld: 172 mg/dL — ABNORMAL HIGH (ref 70–99)
POTASSIUM: 4.4 meq/L (ref 3.5–5.1)
Sodium: 138 mEq/L (ref 135–145)

## 2013-11-04 LAB — HEMOGLOBIN A1C: HEMOGLOBIN A1C: 8.4 % — AB (ref 4.6–6.5)

## 2013-11-14 ENCOUNTER — Other Ambulatory Visit: Payer: Self-pay | Admitting: Pulmonary Disease

## 2013-11-14 DIAGNOSIS — E1140 Type 2 diabetes mellitus with diabetic neuropathy, unspecified: Secondary | ICD-10-CM

## 2013-11-14 DIAGNOSIS — E1165 Type 2 diabetes mellitus with hyperglycemia: Principal | ICD-10-CM

## 2013-11-14 DIAGNOSIS — IMO0002 Reserved for concepts with insufficient information to code with codable children: Secondary | ICD-10-CM

## 2013-11-14 MED ORDER — GLUCOSE BLOOD VI STRP: ORAL_STRIP | Status: DC

## 2013-11-19 ENCOUNTER — Telehealth: Payer: Self-pay | Admitting: *Deleted

## 2013-11-19 NOTE — Telephone Encounter (Signed)
Xarelto samples placed at the front desk for pick up.

## 2013-11-29 ENCOUNTER — Telehealth: Payer: Self-pay | Admitting: Pulmonary Disease

## 2013-12-04 NOTE — Telephone Encounter (Signed)
Patient requesting refill on Tramadol.  Last refill: 04/15/13; Last OV: 10/23/13; next OV 04/24/14. Ok to refill?

## 2013-12-18 ENCOUNTER — Other Ambulatory Visit: Payer: Self-pay | Admitting: Pulmonary Disease

## 2013-12-18 NOTE — Telephone Encounter (Signed)
Patient requesting refill of Tramadol.  Do not see on Patients med list

## 2013-12-19 ENCOUNTER — Encounter (HOSPITAL_COMMUNITY): Payer: Self-pay | Admitting: Cardiovascular Disease

## 2013-12-19 NOTE — Telephone Encounter (Signed)
Can you check with SN about this refill request for Tramadol? Thanks!

## 2013-12-24 ENCOUNTER — Telehealth: Payer: Self-pay | Admitting: Internal Medicine

## 2013-12-24 NOTE — Telephone Encounter (Signed)
New message      Pt brought a neck brace---it goes down her back and under her arms and fastens in the front.  It is loaded with magnets. Could these magnets cause her heart to get out of rhythum?

## 2013-12-24 NOTE — Telephone Encounter (Signed)
Your heart is always out of rhythm and the brace is okay.

## 2013-12-26 ENCOUNTER — Telehealth: Payer: Self-pay | Admitting: Pulmonary Disease

## 2013-12-26 MED ORDER — CIPROFLOXACIN HCL 250 MG PO TABS: 250.0000 mg | ORAL_TABLET | Freq: Two times a day (BID) | ORAL | Status: DC

## 2013-12-26 NOTE — Telephone Encounter (Signed)
Per SN: can call in cipro 250 mg 1 po BID #14 and increase fluids  Pt aware of recs. RX sent in. Nothing further needed

## 2013-12-26 NOTE — Telephone Encounter (Signed)
Spoke with pt, states she has a uti.  Began experiencing burning with urination X1 week ago, burning has stopped but she is having increased frequency in urination.  Is requesting something be sent to [facility] on [name] for this. Last visit: 10/23/13 Next ov: 04/24/14  Dr. Lenna Gilford please advise.  Thank you  Allergies  Allergen Reactions  . Peanut-Containing Drug Products Anaphylaxis and Swelling  . Azithromycin Other (See Comments)    REACTION: pt states INTOL to ZPak  . Flomax [Tamsulosin Hcl] Swelling  . Pioglitazone Other (See Comments)    edema  . Pregabalin Other (See Comments)    REACTION: pt states INTOL to Lyrica  . Sulfonamide Derivatives Swelling   Current Outpatient Prescriptions on File Prior to Visit  Medication Sig Dispense Refill  . acetaminophen (TYLENOL) 500 MG tablet Take 500 mg by mouth every 6 (six) hours as needed for mild pain.    [name] atenolol (TENORMIN) 50 MG tablet TAKE ONE TABLET BY MOUTH TWICE DAILY 60 tablet 1  . atorvastatin (LIPITOR) 40 MG tablet TAKE ONE TABLET BY MOUTH ONCE DAILY AT 6PM 30 tablet 6  . Blood Glucose Monitoring Suppl (ACCU-CHEK AVIVA PLUS) W/DEVICE KIT Test blood sugar once daily 1 kit 0  . diltiazem (CARDIZEM CD) 120 MG 24 hr capsule TAKE ONE CAPSULE BY MOUTH ONCE DAILY 30 capsule 11  . furosemide (LASIX) 20 MG tablet Take 2 tablets (40 mg total) by mouth daily. 60 tablet 11  . gabapentin (NEURONTIN) 100 MG capsule TAKE ONE CAPSULE BY MOUTH THREE TIMES DAILY 90 capsule 0  . glipiZIDE (GLUCOTROL) 10 MG tablet Take 2 tablets by mouth every morning    . glucose blood (ACCU-CHEK AVIVA) test strip Test blood sugar once daily 100 each 6  . glucose blood test strip Use as instructed 100 each 0  . magnesium oxide (MAG-OX) 400 (241.3 MG) MG tablet Take 1 tablet (400 mg total) by mouth 2 (two) times daily. 60 tablet 6  . metFORMIN (GLUCOPHAGE) 500 MG tablet TAKE TWO TABLETS BY MOUTH once  DAILY WITH MEALS    . metFORMIN (GLUCOPHAGE) 500 MG tablet  TAKE TWO TABLETS BY MOUTH TWICE DAILY WITH MEALS 120 tablet 0  . nitroGLYCERIN (NITROSTAT) 0.4 MG SL tablet Place 1 tablet (0.4 mg total) under the tongue every 5 (five) minutes as needed for chest pain (up to 3 doses). 25 tablet 3  . Rivaroxaban (XARELTO) 15 MG TABS tablet Take 15 mg by mouth at bedtime.    . traMADol (ULTRAM) 50 MG tablet TAKE ONE TABLET BY MOUTH THREE TIMES DAILY AS NEEDED FOR PAIN 90 tablet 1  . vitamin B-12 (CYANOCOBALAMIN) 1000 MCG tablet Take 1,000 mcg by mouth every morning.      No current facility-administered medications on file prior to visit.

## 2014-01-02 ENCOUNTER — Telehealth: Payer: Self-pay | Admitting: *Deleted

## 2014-01-02 MED ORDER — RIVAROXABAN 15 MG PO TABS: 15.0000 mg | ORAL_TABLET | Freq: Two times a day (BID) | ORAL | Status: DC

## 2014-01-02 NOTE — Telephone Encounter (Signed)
error 

## 2014-01-02 NOTE — Addendum Note (Signed)
Addended by: China Deitrick, [place] on: 01/02/2014 10:48 AM   Modules accepted: Orders

## 2014-01-02 NOTE — Telephone Encounter (Signed)
Nurse from [facility] office called to see if samples available. Will leave at front desk.

## 2014-01-07 ENCOUNTER — Telehealth: Payer: Self-pay

## 2014-01-07 NOTE — Telephone Encounter (Signed)
PATIENT CALLED WANTING SAMPLES OF XARELTO 15 MG PLACED THEM UP FRONT

## 2014-01-25 ENCOUNTER — Other Ambulatory Visit: Payer: Self-pay | Admitting: Pulmonary Disease

## 2014-02-02 ENCOUNTER — Other Ambulatory Visit: Payer: Self-pay | Admitting: Internal Medicine

## 2014-02-03 ENCOUNTER — Telehealth: Payer: Self-pay | Admitting: Pulmonary Disease

## 2014-02-03 MED ORDER — CIPROFLOXACIN HCL 250 MG PO TABS: 250.0000 mg | ORAL_TABLET | Freq: Two times a day (BID) | ORAL | Status: DC

## 2014-02-03 NOTE — Telephone Encounter (Signed)
Spoke with pt, she is aware of recs.  abx called in.  Nothing further needed.

## 2014-02-03 NOTE — Telephone Encounter (Signed)
Spoke with pt. States she thinks she has a UTI. Reports burning, frequency and a foul odor to her urine. Onset was 1 week ago. Would like something called in.  Allergies  Allergen Reactions  . Peanut-Containing Drug Products Anaphylaxis and Swelling  . Azithromycin Other (See Comments)    REACTION: pt states INTOL to ZPak  . Flomax [Tamsulosin Hcl] Swelling  . Pioglitazone Other (See Comments)    edema  . Pregabalin Other (See Comments)    REACTION: pt states INTOL to Lyrica  . Sulfonamide Derivatives Swelling   SN - please advise. Thanks.

## 2014-02-03 NOTE — Telephone Encounter (Signed)
Per SN---  Call in cipro 250 mg  #14 1 po bid Align once daily with the abx.  thanks

## 2014-02-15 ENCOUNTER — Other Ambulatory Visit: Payer: Self-pay | Admitting: Pulmonary Disease

## 2014-03-05 ENCOUNTER — Telehealth: Payer: Self-pay | Admitting: *Deleted

## 2014-03-05 NOTE — Telephone Encounter (Signed)
Xarelto samples placed at the front desk for patient. 

## 2014-03-08 ENCOUNTER — Other Ambulatory Visit: Payer: Self-pay | Admitting: Pulmonary Disease

## 2014-03-21 ENCOUNTER — Telehealth: Payer: Self-pay | Admitting: Pulmonary Disease

## 2014-03-21 ENCOUNTER — Other Ambulatory Visit: Payer: Self-pay | Admitting: Pulmonary Disease

## 2014-03-21 MED ORDER — CIPROFLOXACIN HCL 250 MG PO TABS: 250.0000 mg | ORAL_TABLET | Freq: Two times a day (BID) | ORAL | Status: DC

## 2014-03-21 NOTE — Telephone Encounter (Signed)
Spoke with patient, aware of rec's per MW States that she is going to call her Urologist for appt d/t recurrent issues. Cipro sent to pharmacy. Nothing further needed.  Will send to SN as FYI.

## 2014-03-21 NOTE — Telephone Encounter (Signed)
Call in cipro 250 mg #10 1 po bid   ? Needs f/u urology as just called in cipro 02/03/14 > send to [facility]'s attention p rx done

## 2014-03-21 NOTE — Telephone Encounter (Signed)
Pt c/o uti- doesn't know how long she's had this.  States she's had it "forever", thought drinking lots of water would get rid of this.  Pt is requesting something be sent to [facility] on [address].    Sending to doc of day as SN is not here today.    Dr. Melvyn Novas please advise.  Thanks!  Last ov:10/23/13 Next ov:04/14/14  Allergies  Allergen Reactions  . Peanut-Containing Drug Products Anaphylaxis and Swelling  . Azithromycin Other (See Comments)    REACTION: pt states INTOL to ZPak  . Flomax [Tamsulosin Hcl] Swelling  . Pioglitazone Other (See Comments)    edema  . Pregabalin Other (See Comments)    REACTION: pt states INTOL to Lyrica  . Sulfonamide Derivatives Swelling

## 2014-03-31 ENCOUNTER — Telehealth: Payer: Self-pay | Admitting: *Deleted

## 2014-03-31 NOTE — Telephone Encounter (Signed)
Xarelto samples placed at the front desk for patient. 

## 2014-04-07 ENCOUNTER — Other Ambulatory Visit: Payer: Self-pay | Admitting: Internal Medicine

## 2014-04-18 ENCOUNTER — Other Ambulatory Visit: Payer: Self-pay | Admitting: Physician Assistant

## 2014-04-18 ENCOUNTER — Other Ambulatory Visit: Payer: Self-pay | Admitting: Pulmonary Disease

## 2014-04-18 NOTE — Telephone Encounter (Signed)
Ok to refill for patient under Dr Lovena Le? Please advise. Thanks, MI

## 2014-04-18 NOTE — Telephone Encounter (Signed)
Ok to fill 

## 2014-04-24 ENCOUNTER — Other Ambulatory Visit (INDEPENDENT_AMBULATORY_CARE_PROVIDER_SITE_OTHER): Payer: Medicare Other

## 2014-04-24 ENCOUNTER — Ambulatory Visit (INDEPENDENT_AMBULATORY_CARE_PROVIDER_SITE_OTHER): Payer: Medicare Other | Admitting: Pulmonary Disease

## 2014-04-24 ENCOUNTER — Ambulatory Visit (INDEPENDENT_AMBULATORY_CARE_PROVIDER_SITE_OTHER)
Admission: RE | Admit: 2014-04-24 | Discharge: 2014-04-24 | Disposition: A | Discharge status: Home / Self Care | Payer: Medicare Other | Source: Ambulatory Visit | Attending: Pulmonary Disease | Admitting: Pulmonary Disease

## 2014-04-24 ENCOUNTER — Encounter: Payer: Self-pay | Admitting: Pulmonary Disease

## 2014-04-24 VITALS — BP 110/70 | HR 64 | Temp 97.1°F | Ht 64.0 in | Wt 123.0 lb

## 2014-04-24 DIAGNOSIS — Z8673 Personal history of transient ischemic attack (TIA), and cerebral infarction without residual deficits: Secondary | ICD-10-CM

## 2014-04-24 DIAGNOSIS — M159 Polyosteoarthritis, unspecified: Secondary | ICD-10-CM

## 2014-04-24 DIAGNOSIS — N3946 Mixed incontinence: Secondary | ICD-10-CM

## 2014-04-24 DIAGNOSIS — E7800 Pure hypercholesterolemia, unspecified: Secondary | ICD-10-CM

## 2014-04-24 DIAGNOSIS — I4821 Permanent atrial fibrillation: Secondary | ICD-10-CM

## 2014-04-24 DIAGNOSIS — E1165 Type 2 diabetes mellitus with hyperglycemia: Secondary | ICD-10-CM

## 2014-04-24 DIAGNOSIS — I10 Essential (primary) hypertension: Secondary | ICD-10-CM | POA: Diagnosis not present

## 2014-04-24 DIAGNOSIS — IMO0002 Reserved for concepts with insufficient information to code with codable children: Secondary | ICD-10-CM

## 2014-04-24 DIAGNOSIS — M810 Age-related osteoporosis without current pathological fracture: Secondary | ICD-10-CM

## 2014-04-24 DIAGNOSIS — I482 Chronic atrial fibrillation: Secondary | ICD-10-CM

## 2014-04-24 DIAGNOSIS — K449 Diaphragmatic hernia without obstruction or gangrene: Secondary | ICD-10-CM

## 2014-04-24 DIAGNOSIS — I5042 Chronic combined systolic (congestive) and diastolic (congestive) heart failure: Secondary | ICD-10-CM

## 2014-04-24 DIAGNOSIS — M150 Primary generalized (osteo)arthritis: Secondary | ICD-10-CM

## 2014-04-24 DIAGNOSIS — E1140 Type 2 diabetes mellitus with diabetic neuropathy, unspecified: Secondary | ICD-10-CM

## 2014-04-24 DIAGNOSIS — R269 Unspecified abnormalities of gait and mobility: Secondary | ICD-10-CM

## 2014-04-24 DIAGNOSIS — I2510 Atherosclerotic heart disease of native coronary artery without angina pectoris: Secondary | ICD-10-CM

## 2014-04-24 DIAGNOSIS — R413 Other amnesia: Secondary | ICD-10-CM

## 2014-04-24 DIAGNOSIS — F411 Generalized anxiety disorder: Secondary | ICD-10-CM

## 2014-04-24 LAB — BASIC METABOLIC PANEL
BUN: 19 mg/dL (ref 6–23)
CHLORIDE: 97 meq/L (ref 96–112)
CO2: 31 meq/L (ref 19–32)
Calcium: 10.2 mg/dL (ref 8.4–10.5)
Creatinine, Ser: 0.92 mg/dL (ref 0.40–1.20)
GFR: 62.17 mL/min (ref >60.00)
Glucose, Bld: 175 mg/dL — ABNORMAL HIGH (ref 70–99)
Potassium: 4.5 mEq/L (ref 3.5–5.1)
Sodium: 135 mEq/L (ref 135–145)

## 2014-04-24 LAB — CBC WITH DIFFERENTIAL/PLATELET
BASOS PCT: 0.3 % (ref 0.0–3.0)
Basophils Absolute: 0 10*3/uL (ref 0.0–0.1)
Eosinophils Absolute: 0.2 10*3/uL (ref 0.0–0.7)
Eosinophils Relative: 3 % (ref 0.0–5.0)
HEMATOCRIT: 39.9 % (ref 36.0–46.0)
Hemoglobin: 13.6 g/dL (ref 12.0–15.0)
Lymphocytes Relative: 17.9 % (ref 12.0–46.0)
Lymphs Abs: 1 10*3/uL (ref 0.7–4.0)
MCHC: 34 g/dL (ref 30.0–36.0)
MCV: 94.1 fl (ref 78.0–100.0)
Monocytes Absolute: 0.5 10*3/uL (ref 0.1–1.0)
Monocytes Relative: 8.4 % (ref 3.0–12.0)
NEUTROS ABS: 3.8 10*3/uL (ref 1.4–7.7)
NEUTROS PCT: 70.4 % (ref 43.0–77.0)
Platelets: 218 10*3/uL (ref 150.0–400.0)
RBC: 4.24 Mil/uL (ref 3.87–5.11)
RDW: 14 % (ref 11.5–15.5)
WBC: 5.4 10*3/uL (ref 4.0–10.5)

## 2014-04-24 LAB — HEPATIC FUNCTION PANEL
ALBUMIN: 4.3 g/dL (ref 3.5–5.2)
ALT: 8 U/L (ref 0–35)
AST: 14 U/L (ref 0–37)
Alkaline Phosphatase: 76 U/L (ref 39–117)
Bilirubin, Direct: 0.2 mg/dL (ref 0.0–0.3)
Total Bilirubin: 1 mg/dL (ref 0.2–1.2)
Total Protein: 7.3 g/dL (ref 6.0–8.3)

## 2014-04-24 LAB — LIPID PANEL
Cholesterol: 214 mg/dL — ABNORMAL HIGH (ref 0–200)
HDL: 86 mg/dL (ref >39.00)
LDL CALC: 107 mg/dL — AB (ref 0–99)
NONHDL: 128
TRIGLYCERIDES: 106 mg/dL (ref 0.0–149.0)
Total CHOL/HDL Ratio: 2
VLDL: 21.2 mg/dL (ref 0.0–40.0)

## 2014-04-24 LAB — BRAIN NATRIURETIC PEPTIDE: Pro B Natriuretic peptide (BNP): 438 pg/mL — ABNORMAL HIGH (ref 0.0–100.0)

## 2014-04-24 LAB — HEMOGLOBIN A1C: Hgb A1c MFr Bld: 9.4 % — ABNORMAL HIGH (ref 4.6–6.5)

## 2014-04-24 LAB — TSH: TSH: 4.61 u[IU]/mL — ABNORMAL HIGH (ref 0.35–4.50)

## 2014-04-24 NOTE — Progress Notes (Addendum)
Subjective:    Patient ID: Michelle Herrera, female    DOB: 12/01/1930, 81 y.o.   MRN: [id number]  HPI 81 y/o WF here for a follow up visit... SEE PREV EPIC NOTES FOR OLDER DATA >>     Adm 1/13 w/ renal infarcts felt due to micro-emboli & inadeq INR ==> changed to [place].  Hosp 5/14 for NSTEMI=> stent to RCA.  LABS 10/14:  FLP- looks good on Lip40;  Chems- ok x BS=181, A1c=8.8.Marland KitchenMarland Kitchen  Hosp 3/15 w/ NSTEMI & cath showed widely patent coronaries & EF=50%.  ~  April 24, 2013:  57moROV & Michelle Herrera has had an eventful interval> followed by DrTaylor for Cards w/ AFib, CAD, biventric failure, pulmHTN;  Episode severe epistaxis 11/14 requiring electocautery & ASA was stopped & Xarelto was decreased to 152md;  She was Hosp by [facility] 1/11 - 01/22/13 w/ incr SOB & palpit due to AFib w/ RVR and DiastolicCHF, she was diuresed, meds adjusted, and improved;  She was again Hosp 3/3 - 03/15/13 by [facility] w/ CP, AFib & RVR, NSTEMI likely from demand ischemia since cath showed widely patent coronaries, EF=50% w/ mild inferior wall HK;  She had f/u w/ [name] 3/15 & doing satis...  We reviewed the following medical problems during today's office visit >>     HBP> on Aten50Bid, Diltiazem120,  & Lasix20; BP= 110/64 & she denies CP/ SOB/ edema, notes occas palpit...    CAD, Cardiomyopathy (sys & diast CHF), Valvular Dis w/ MR> off ASA81 due to epistaxis, on Xarelto15, Aten50Bid, Diltiazem120, Lasix20; Adm 5/14 by [facility] w/ NSTEMI, Cath=> stent to 95%RCA; followed by DrTaylor; Adm 1/15 & 3/15 w/ AFib, RVR, NSTEMI due to demand ischemia and recath w/ widely patent coronaries, EF=50%; meds adjusted...    AFib> on Xarelto15 due to epistaxis; followed by DrTaylor & cardiology notes are reviewed...    Chol> on Lip40; weight is down to 109#, BMI=18; FLP 10/14 shows TChol 138, TG 63, HDL 65, LDL 61    DM w/ neuropathy> on Metform500-2Bid, Glipiz10-2Bid; prev followed by DrEllison but last seen 6/13- Actos stopped due to edema; won't  take additional meds due to cost; she claims that BS higher when her knee is hurting; Labs 10/14 showed BS=181, A1c=8.8 & Labs 4/15 showed BS=181, A1c=9.3;  I called pharm [facility] on [name]- she has filled #120 of each in March & April=> continue same & better low carb diet (may need the inexpensive 70/30 insulin)    GI- HH, colon polyps, s/p GB> stable she says, not on regular PPI dosing, last colon was 2003 by DrStark w/ 73m73molyp & divertics; denies abd pain, n/v, d/c, blood seen...    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    DJD, LBP, right shoulder pain> on Neurontin100Tid; Ortho evals from DrNorris- prev knee surg for torn cartilage, back discomfort is improved she says...    Osteoporosis, Vit D defic> Vit D level 2013 = 30 and she was asked to incr her OTC Vit D supplement to 2000u daily...    Memory Loss> Hx 2 sm infarcts on prev MRI, on Xarelto from Cards; she declines Aricept etc...    Anxiety> mod stress in the family, she declines anxiolytic meds...    B12 defic> on OTC oral B12 supplement 1000u daily... We reviewed prob list, meds, xrays and labs> see below for updates >> SHE DID NOT BRING MED BOTTLES & DOES NOT KNOW  HER MEDS!!!  LABS 4/15:  Chems- ok x BS=181, A1c=9.3;  LFTs- wnl;  CBC- wnl;  Fe=121 (31%sat);    ADDENDUM>> she notes that she has no appetite & "not eating"; she has lost 21# in 64modown to 109# & BMI=18; but serum proteins WNL; we will treat w/ MEGACE 2047mbefore meals Tid...  ~  October 23, 2013:  5m90moV & Michelle Herrera that she is feeling well, no new complaints or concerns;  Over the 5mo37moerval however she has decreased both of her DM meds in half as she says she doesn't tolerate the max doses due to "feeling bad, GI upset, & diarrhea";  She has also seen DrWhitfield for back & side pain- she Herrera he did XRays but doesn't know what it is & we don't have  his notes to review; she gets relief w/ Tramadol but she states 50mg34m gives her diarrhea & she is rec to take 1/2 tab w/ an ESTylenol... We reviewed the following medical problems during today's office visit >>     HBP> on Aten50Bid, Diltiazem120,  & Lasix20-2Qam; BP= 110/64 & she denies CP/ SOB/ edema, notes occas palpit...    CAD, Cardiomyopathy (sys & diast CHF), Valvular Dis w/ MR> off ASA81 due to epistaxis, on Xarelto15 (lowered dose due to nose bleed) & above meds; Adm 5/14 by [facility] w/ NSTEMI, Cath=> stent to 95%RCA; followed by DrTaylor; Adm 1/15 & 3/15 w/ AFib, RVR, NSTEMI due to demand ischemia and recath w/ widely patent coronaries, EF=50%; meds adjusted...    AFib, CHB & Pacer> on Xarelto15 due to epistaxis; followed by DrTaylor & seen 8/15> cardiology notes are reviewed...    Chol> on Lip40; weight is up to 116#, BMI=19; FLP 10/15 shows TChol 127, TG 54, HDL 69, LDL 47    DM w/ neuropathy> prev on Metform500-2Bid & Glipiz10-2Bid but INTOL she says & decreased to 2 of each Qam only; prev followed by DrEllison but last seen 6/13- Actos stopped due to edema; won't take additional meds due to cost; refuses insulin shots; she claims that BS higher when her knee is hurting; Labs 10/15 showed BS=172, A1c=8.4 (sl improved) & asked to ADD Metform500 & Glipiz10 (one of each) in PM at dinner...     GI- HH, colon polyps, s/p GB> stable she says, not on regular PPI dosing, last colon was 2003 by DrStark w/ 5mm p75mp & divertics; denies abd pain, n/v, d/c, blood seen...    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     DJD, LBP, right shoulder pain> on Neurontin100Tid; Ortho evals from DrNorris/ Whitfield- prev knee surg for torn cartilage, back discomfort treated w/ Tramadol &  Tylenol...    Osteoporosis, Vit D defic> Vit D level 2013 = 30 and she was asked to incr her OTC Vit D supplement to 2000u daily; needs f/u BMD but she wants to wait...    Memory Loss> Hx 2 sm infarcts on prev MRI, on Xarelto from Cards; she declines Aricept etc...    Anxiety> mod stress in the family, she declines anxiolytic meds...    B12 defic> on OTC oral B12 supplement 1000u daily... We reviewed prob list, meds, xrays and labs> see below for updates >> Given 2015 Flu vaccine today; she did not bring meds or med list  to office visit today!  LABS 10/15:  FLP- looks great on Lip40;  Chems- BS=172, A1c=8.4 on her Metform500-2Qam & glipiz10-2Qam... PLAN>>  Rec to increase her DM meds to include one of each at dinner time in PM, plus diet, exercise, etc (NOTE- she did NOT increase the meds as directed)...   ~  April 24, 2014:  46moRByersstates she's doing well- CC is hip pain & she notes that Tylenol helps;     HBP, CAD, cardiomyop (sys & diast CHF), MR> on Aten50, Cardizem120, Lasix20-2/d; BP= 110/70 & she Herrera doing well- no CP etc...    PAF, CHB, pacer> followed by DrTaylor on Xarelto15 (decreased due to epistaxis); she Herrera stable- no CP, palpit, dizzy, nose bleeds etc...    Chol> on Lip40; FLP 4/16 showed TChol 214, TG 106, HDL 86, LDL 107... We reviewed diet, continue same med.    DM> on Metform500-2Qam & Glucotrol10-2Qam; compliance is ?? She has been reluctant to follow my recs for increased meds; Labs 4/16 showed BS=175, A1c=9.4 => refer to [facility] for DM management.    Other problems as listed> on Neurontin100Tid, MVI, B12-1000/d... We reviewed prob list, meds, xrays and labs> see below for updates >>   CXR 4/16 showed mod cardiomeg, hyperinflation, sl blunt angles c/w pleural thickening, DJD Tspine, osteopenia, NAD...  LABS 4/16:  FLP- note quite at goals;  Chems- ok x BS=175, A1c=9.4;  CBC- wnl;  TSH=4.61;  BNP=438...  ADDENDUM>> she REFUSED referral to Endocrinology  for DM mamagement, refuses to increase her DM meds per my recommendations, refuses to start insulin therapy; she says that she will continue current meds and get on diet + exercise to get her BS improved & A1c better...           Problem List:     HYPERTENSION >> VALVULAR HEART DISEASE >> ATRIAL FIBRILLATION - stable on Rx w/ XARELTO & rate control strategy. ~  4/11:  she Herrera that she stopped her Lanoxin 0.125 on her own & doesn't want to restart. ~  8/11:  f/u DrTaylor & stable on Coumadin w/ rate control strategy, no changes made. ~  11/11:  Hosp by Neuro w/ TIA> 2DEcho showed mild LVH, diffuse HK w/ EF= 45%, thick pos MV leaflet w/ restrict motion & modMR, dilated LA&RA, severeTR & PAsys=49, can't r/o PFO... ~  8/12:  her BP=116/84 and she feels OK- denies HA, fatigue, visual changes, CP, palipit, dizziness, syncope, dyspnea, edema, etc... ~  12/12:  BP= 126/88 and she remains asymptomatic as above... ~  1/13:  2DEcho showed mild LVH w/ EF= 40-45% & diffuse HK, Diastolic dysfunction, modMR, severe LAE, modTR, Pasys=45 ~  [facility] due to the renal infarcts & difficulty maintaining INR. ~  CXR 1/13 showed marked cardiomegaly, pulm vasc congestion, NAD... ~  4/13:  BP= 128/60 and she is feeling better she says;  BUN=15, Creat=0.8; She had f/u DrTaylor> noted to be doing well w/o symptoms- continue same meds & low Na. ~  EKG 4/13 showed AFib, rate 77, right ward axis & NSSTTWA... ~  8/13:  BP= 110/82 and she denies current CP, palpit, SOB, edema... ~  4/14: on Aten50Bid; BP= 130/84 & she denies CP/ SOB/ edema, notes occas palpit. ~  5/14:  Adm by Cards> NSTEMI, Cath w/ LAD minor irreg only, LCirc w/ ostial 40-50%, mid RCA 95%, EF 55%. PCI: Vision BMS to the mid RCA. There was moderate MR  on left ventriculogram;  ~  CXR 5/14 showed cardiomeg, underlying COPD w/ apical scarring, DJD in spine, NAD... ~  EKG 6/14 showed AFib, rate72, NSSTTWA...  ~  2DEcho  6/14 showed norm LV size & funct w/ EF=55%, mild MR, severe RA&LA dil, modTR,  ~  Adm 1/15 & 3/15 w/ CP, AFib w/ RVR, had NSTEMI 3/15 from demand ischemia; cath w/ patent coronaries, EF=50%; meds adjusted w/ stop ASA & decr Xarelto to 104m due to severe epistaxis... ~  CXR 3/15 showed cardiomegaly, hyperinflated lungs, biapical pleural thickening, NAD... ~  EKG 3/15 showed AFib, rate74, NSSTTWA... ~  4/15: on Aten50Bid, Diltiazem120, & Lasix20; also taking Xarelto15; BP= 110/64 & she denies CP/ SOB/ edema, notes occas palpit... ~  10/15:  on Aten50Bid, Diltiazem120,  & Lasix20-2Qam, Xarelto15; BP= 110/64 & she claims asymptomatic... ~  4/16: on Aten50, Cardizem120, Lasix20-2/d, Xarelto15; she Herrera doing well- no CP etc; BP= 110/70... ~  CXR 4/16 showed mod cardiomeg, hyperinflation, sl blunt angles c/w pleural thickening, DJD Tspine, osteopenia, NAD.  HYPERCHOLESTEROLEMIA - on diet + SIMVASTATIN 480md... ~  FLYauco007 showed TChol 160, TG 143, HDL 55, LDL 76 ~  FLP 7/09 showed TChol 165, TG 129, HDL 60, LDL 80 ~  FLP 10/10 showed TChol 175, TG 68, HDL 80, LDL 82 ~  FLP 4/11 showed TChol 207, TG 104, HDL 84, LDL 111... rec better diet, continue same med. ~  FLP 11/11 on Simva40 showed TChol 180, TG 71, HDL 67, LDL 99 ~  FLP 8/12 on Simva40 showed TChol 196, TG 96, HDL 89, LDL 88 ~  FLP 4/13 on Simva40 showed TChol 211, TG 77, HDL 85, LDL 112... rec better diet, same med. ~  FLP 10/14 on Lip40 showed TChol 138, TG 63, HDL 65, LDL 61  ~  FLP 10/15 on Lip40 showed  TChol 127, TG 54, HDL 69, LDL 47 ~  FLP 4/16 on Lip40 showed TChol 214, TG 106, HDL 86, LDL 107   TYPE 2 DIABETES MELLITUS, Uncontrolled, w/ Neuropathy >>  ~  on METFORMIN 50025m2tabsBid, GLIPZIDE 17m72m (not taking Januvia due to $$)... ~  labs 1/09 showed BS= 148, HgA1c= 7.1... cMarland KitchenMarland Kitchentinue meds + better diet... ~  labs 7/09 showed BS= 168, HgA1c= 7.6... iMarland KitchenMarland Kitchenr Metform 2Bid, contin Glip 10Bid & Januv... ~  she stopped the Januvia due  to cost... ~  labs 4/10 showed BS= 185, Aic= 8.0... rMarland KitchenMarland Kitcheninder to take meds regularly. ~  labs 10/10 on Metform2Bid+Glip10Bid showed BS= 124, A1c= 6.7 ~  labs 4/11 showed BS= 162, A1c= 6.8... rMarland Kitchenc> better diet, take meds regularly. ~  labs 10/11 showed BS= 322, A1c= 8.3... PMarland KitchenMarland Kitchenrm confirms not taking meds regularly!!! Discussed w/ pt... ~  Labs 4/12 showed BS= 91, A1c= 7.9... RMarland KitchenMarland Kitchen> keep same meds, take regularly, better low carb diet... ~  Labs 8/12 on Metform2Bid+Glip10Bid showed BS= 114, A1c= 7.3... CMarland KitchenMarland Kitchentinue same... ~  Labs in hosp 1/13 showed BS~ 153-209 & A1c=7.4... NOTE: she has refused other meds due to $$$ ~  Labs 4/13 on Metform2Bid+Glipiz10Bid showed BS= 208, A1c= 8.6... She is referred to Endocrinology ~  She saw DrEllison but refused Actos, refused Gliptins, etc... ~  Labs 8/13 on Metform2Bid+Glipiz10Bid showed BS= 163, A1c= 7.5... CMarland KitchenMarland Kitchentinue same + better diet... ~  Labs 10/14 on Metform2Bid+Glipiz10Bid showed BS= 181, A1c= 8.8... rMarland KitchenMarland Kitchen better diet & incr Glipiz10-2Bid... ~  Labs 4/15 on Metform500-2Bid & Glipizide10-2Bid showed BS= 181, A1c= 9.3; she refuses additional meds; she cut back both meds to  just Qam due to "side effects"... ~  Labs 10/15 on Metform500-2Qam & Glipiz10-2Qam showed BS= 172, A1c= 8.4 and REC to add one of each at dinner in the PM...  ~  Labs 4/16 on Metform500-2Qam & Glipiz10-2Qam showed BS=175, A1c=9.4 (compliance is ?? She has been reluctant to follow my recs for increased meds) => refer to [facility] for DM management.  HIATAL HERNIA (ICD-553.3) - last EGD by DrStark was 3/03 and showed a HH, reflux...  COLONIC POLYPS (ICD-211.3) - last colonoscopy 3/03 by DrStark showed divertics and 65m polyp (no path avail).  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 pt Herrera that there is nothing they can do to help... ~  she has had several UTI's... then perineal rash & saw  GYN= neg PAP & Rx yeast infection- resolved. ~  She will f/u w/ Urology for persistant symptoms, seen by DrEskridge w/ trial Myrbetriq but not much benefit per pt... ~  7/15: referred to DrEskridge by Ortho after MRI spine showed ?bilat hydro; reviewed by Urology & they disagreed- no hydro but she has hx renal infarct 1/13, bilat extrarenal pelvis, & left peri-pelvic cysts, small bladder capacity, known cystocele...  DEGENERATIVE JOINT DISEASE (ICD-715.90) - she needs right TKR but wants to put this off as long as poss... ~  4/12:  Ortho eval by DrNorris, hx prev knee arthroscopies, given Cortisone shot, & he plans ?Synvisc series... ~  9/12:  outpt knee surg to repair torn cartilage per DrNorris; preop clearance by DrTaylor & [facility]...  BACK PAIN, LUMBAR (ICD-724.2) - XRays showed scoliosis, facet degen arthritis, & osteopenia...  she has used ROBAXIN Prn... ~  4/12:  She Herrera eval from DrNorris w/ "arthritis all down my backbone" & he rec epid steroid injection, but she Herrera improved on NEURONTIN 1047mTid... ~  4/13:  She Herrera that back pain has improved but she still has some leg pain but notes that "it's tolerable"...  SHOULDER PAIN, RIGHT (ICD-719.41) - prev eval & Rx by DrRendall... s/p right shoulder surg 3/09... XRays showed calcium deposit, rotator cuff prob, & intol to Tramadol... ~  She tells me she wants to f/u w/ DrWhitfield for Ortho...  OSTEOPOROSIS (ICD-733.00) ~  labs 10/10 w/ Vit D level = 32... rec> start Vit D OTC 1000 u daily. ~  Labs 4/13 showed Vit D level = 30... rec to incr to 2000u daily...  MEMORY LOSS (ICD-780.93) - concern for her memory expressed 7/09 OV- tried Aricept but she stopped it- "no different". TIA/ INFARCT - she was Hosp 11/11 by DrWillis w/ left sided weakness, ?left facial droop, & MRI showed 2 sm infarcts on right (frontal & ?pontine);  MRA was neg;  CDopplers were neg;  Deficits all cleared rapidly;  2DEcho was abn- see above;  She  was continued on her Coumadin Rx- no changes made... ~  4/13:  She was offered memory medications but she declined...  ANXIETY (ICD-300.00) - stress in family... 2 y/o granddau w/ seizures...  VITAMIN B12 DEFICIENCY >> on OTC Vit B-12 tabs orally> 100016md... ~  Labs 7/11 showed Vit V12 level = 135... Started on Vit B12 supplement w/ 1000m24m... ~  Labs 4/13 showed Vit B12 level = 905... Ok top decr to 1/2 tab daily...   Past Surgical History  Procedure Laterality Date  . Cholecystectomy    . Rt.leg surgery  11/2010  . Cataract surgery/both eyes  01/2012 left eye---02/2012 right eye  . Left heart catheterization with coronary angiogram N/A 05/28/2012    Procedure: LEFT HEART CATHETERIZATION WITH CORONARY ANGIOGRAM;  Surgeon: Sherren Mocha, MD;  Location: [facility] CATH LAB;  Service: Cardiovascular;  Laterality: N/A;  . Percutaneous coronary stent intervention (pci-s)  05/28/2012    Procedure: PERCUTANEOUS CORONARY STENT INTERVENTION (PCI-S);  Surgeon: Sherren Mocha, MD;  Location: [facility] CATH LAB;  Service: Cardiovascular;;  . Left heart catheterization with coronary angiogram N/A 03/14/2013    Procedure: LEFT HEART CATHETERIZATION WITH CORONARY ANGIOGRAM;  Surgeon: Sinclair Grooms, MD;  Location: [facility] CATH LAB;  Service: Cardiovascular;  Laterality: N/A;    Outpatient Encounter Prescriptions as of 04/24/2014  Medication Sig  . acetaminophen (TYLENOL) 500 MG tablet Take 500 mg by mouth every 6 (six) hours as needed for mild pain.  [name] atenolol (TENORMIN) 50 MG tablet TAKE ONE TABLET BY MOUTH ONCE DAILY-MUST BE SEEN  . atorvastatin (LIPITOR) 40 MG tablet TAKE ONE TABLET BY MOUTH ONCE DAILY AT 6PM  . Blood Glucose Monitoring Suppl (ACCU-CHEK AVIVA PLUS) W/DEVICE KIT Test blood sugar once daily  . ciprofloxacin (CIPRO) 250 MG tablet Take 1 tablet (250 mg total) by mouth 2 (two) times daily.  . ciprofloxacin (CIPRO) 250 MG tablet Take 1 tablet (250 mg total) by mouth 2 (two) times daily.  .  ciprofloxacin (CIPRO) 250 MG tablet Take 1 tablet (250 mg total) by mouth 2 (two) times daily.  [name] diltiazem (CARDIZEM CD) 120 MG 24 hr capsule TAKE ONE CAPSULE BY MOUTH ONCE DAILY  . furosemide (LASIX) 20 MG tablet Take 2 tablets (40 mg total) by mouth daily.  [name] gabapentin (NEURONTIN) 100 MG capsule TAKE ONE CAPSULE BY MOUTH THREE TIMES DAILY  . gabapentin (NEURONTIN) 100 MG capsule TAKE ONE CAPSULE BY MOUTH THREE TIMES DAILY  . glipiZIDE (GLUCOTROL) 10 MG tablet Take 2 tablets by mouth every morning  . glipiZIDE (GLUCOTROL) 10 MG tablet TAKE TWO TABLETS BY MOUTH TWICE DAILY  . glucose blood (ACCU-CHEK AVIVA) test strip Test blood sugar once daily  . glucose blood test strip Use as instructed  . magnesium oxide (MAG-OX) 400 (241.3 MG) MG tablet TAKE ONE TABLET BY MOUTH TWICE DAILY  . metFORMIN (GLUCOPHAGE) 500 MG tablet TAKE TWO TABLETS BY MOUTH once  DAILY WITH MEALS  . metFORMIN (GLUCOPHAGE) 500 MG tablet TAKE TWO TABLETS BY MOUTH TWICE DAILY WITH MEALS  . nitroGLYCERIN (NITROSTAT) 0.4 MG SL tablet Place 1 tablet (0.4 mg total) under the tongue every 5 (five) minutes as needed for chest pain (up to 3 doses).  . Rivaroxaban (XARELTO) 15 MG TABS tablet Take 15 mg by mouth at bedtime.  . Rivaroxaban (XARELTO) 15 MG TABS tablet Take 1 tablet (15 mg total) by mouth 2 (two) times daily with a meal.  . traMADol (ULTRAM) 50 MG tablet TAKE ONE TABLET BY MOUTH THREE TIMES DAILY AS NEEDED FOR PAIN  . vitamin B-12 (CYANOCOBALAMIN) 1000 MCG tablet Take 1,000 mcg by mouth every morning.      Allergies  Allergen Reactions  . Peanut-Containing Drug Products Anaphylaxis and Swelling  . Azithromycin Other (See Comments)    REACTION: pt states INTOL to ZPak  . Flomax [Tamsulosin Hcl] Swelling  . Pioglitazone Other (See Comments)    edema  . Pregabalin Other (See Comments)    REACTION: pt states INTOL to Lyrica  . Sulfonamide Derivatives Swelling    Review of Systems        See HPI - all other  systems neg except as noted... The patient complains of weight loss, chest pain, dyspnea on exertion, and muscle weakness.  The patient denies anorexia, fever, weight gain, vision loss, decreased hearing, hoarseness, syncope, peripheral edema, prolonged cough, headaches, hemoptysis, abdominal pain, melena, hematochezia, severe indigestion/heartburn, hematuria, incontinence, suspicious skin lesions, transient blindness, difficulty walking, depression, unusual weight change, abnormal bleeding, enlarged lymph nodes, and angioedema.     Objective:   Physical Exam      WD, WN, 81 y/o WF in NAD...  GENERAL:  Alert & oriented; pleasant & cooperative... HEENT:  [place]/AT, EOM-wnl, PERRLA, EACs-clear, TMs-wnl, NOSE-clear, THROAT-clear & wnl. NECK:  Supple w/ fairROM; no JVD; normal carotid impulses w/o bruits; no thyromegaly or nodules palpated; no lymphadenopathy. CHEST:  Clear to P & A; without wheezes/ rales/ or rhonchi; +tender left 11th-12th ribs & costal margin. HEART:  Iregular Rhythm= AFib; without murmurs/ rubs/ or gallops heard... ABDOMEN:  Soft & nontender; normal bowel sounds; no organomegaly or masses detected. EXT: without deformities, mild arthritic changes; no varicose veins/ venous insuffic/ or edema. NEURO:  CN's intact;  no focal neuro deficits found... DERM:  No lesions noted; no rash etc...  RADIOLOGY DATA:  Reviewed in the EPIC EMR & discussed w/ the patient...  LABORATORY DATA:  Reviewed in the EPIC EMR & discussed w/ the patient...   Assessment & Plan:    HBP>  Stable on BBlocker, CCB, Lasix20; continue same... SHE DOES NOT KNOW HER MEDS & DID NOT BRING BOTTLES.  CAD>  Hosp 5/14 for NSTEMI=> stent to RCA; Hosp 3/15 w/ NSTEMI & cath showed widely patent coronaries & EF=50%...  AFIB>  She had abn 2DEcho in Hosp 11/11 & 1/13- reviewed by Cards- she sees DrTaylor et al & on XARELTO15 due to epistaxis...  CHOL>  FLP looked fair on the Simva40 + diet rx; switched to Lip40 during  the 5/14 [facility] for NSTEMI & FLP looks good...  DM>  A1c is 8.4 now on Metform500-2Qam & Glipiz10-2Qam & she refused additional meds, A1c now up to 9.4 => refer to [facility] for DM management...  RENAL INFARCTION>  Adm 1/13 w/ renal infarcts felt due to micro-emboli & inadeq INR ==> changed to XARELTO... URINARY INCONTINENCE>  Followed by Eli Hose now Eskridge & she feels that there is nothing they can do to help her; wears pads; offered second opinion & she will consider it...  ORTHO>  Followed by DrNorris/ [facility] & she is s/p cortisone shots in right knee & ?Synvisc series; she had arthroscopic surg for torn cartilage 9/12 she says & she doesn't want TKR.  STROKE>  She has had a CVA w/ hosp by Neuro 11/11 [facility] records all reviewed);  She was continued on her Coumadin per DrWillis & followed in [facility]=> changed to [facility] 2013 as noted;  Stable w/o recurrent cerebral ischemic symptoms...  Other medical problems as noted... She will continue the Calcium, MVI, Vit D, & Vit B12 regimen...   Patient's Medications  New Prescriptions   No medications on file  Previous Medications   ACETAMINOPHEN (TYLENOL) 500 MG TABLET    Take 500 mg by mouth every 6 (six) hours as needed for mild pain.   ATENOLOL (TENORMIN) 50 MG TABLET    TAKE ONE TABLET BY MOUTH ONCE DAILY-MUST BE SEEN   ATORVASTATIN (LIPITOR) 40 MG TABLET    TAKE ONE TABLET BY MOUTH ONCE DAILY AT 6PM   BLOOD GLUCOSE MONITORING SUPPL (ACCU-CHEK AVIVA PLUS) W/DEVICE KIT    Test blood sugar  once daily   DILTIAZEM (CARDIZEM CD) 120 MG 24 HR CAPSULE    TAKE ONE CAPSULE BY MOUTH ONCE DAILY   FUROSEMIDE (LASIX) 20 MG TABLET    Take 2 tablets (40 mg total) by mouth daily.   GABAPENTIN (NEURONTIN) 100 MG CAPSULE    TAKE ONE CAPSULE BY MOUTH THREE TIMES DAILY   GLIPIZIDE (GLUCOTROL) 10 MG TABLET    Take 2 tablets by mouth every morning   GLUCOSE BLOOD TEST STRIP    Use as instructed   MAGNESIUM OXIDE (MAG-OX) 400 (241.3 MG) MG TABLET     TAKE ONE TABLET BY MOUTH TWICE DAILY   METFORMIN (GLUCOPHAGE) 500 MG TABLET    TAKE TWO TABLETS BY MOUTH once  DAILY WITH MEALS   NITROGLYCERIN (NITROSTAT) 0.4 MG SL TABLET    Place 1 tablet (0.4 mg total) under the tongue every 5 (five) minutes as needed for chest pain (up to 3 doses).   RIVAROXABAN (XARELTO) 15 MG TABS TABLET    Take 1 tablet (15 mg total) by mouth 2 (two) times daily with a meal.   VITAMIN B-12 (CYANOCOBALAMIN) 1000 MCG TABLET    Take 1,000 mcg by mouth every morning.   Modified Medications   No medications on file  Discontinued Medications   CIPROFLOXACIN (CIPRO) 250 MG TABLET    Take 1 tablet (250 mg total) by mouth 2 (two) times daily.   CIPROFLOXACIN (CIPRO) 250 MG TABLET    Take 1 tablet (250 mg total) by mouth 2 (two) times daily.   CIPROFLOXACIN (CIPRO) 250 MG TABLET    Take 1 tablet (250 mg total) by mouth 2 (two) times daily.   GABAPENTIN (NEURONTIN) 100 MG CAPSULE    TAKE ONE CAPSULE BY MOUTH THREE TIMES DAILY   GLIPIZIDE (GLUCOTROL) 10 MG TABLET    TAKE TWO TABLETS BY MOUTH TWICE DAILY   GLUCOSE BLOOD (ACCU-CHEK AVIVA) TEST STRIP    Test blood sugar once daily   METFORMIN (GLUCOPHAGE) 500 MG TABLET    TAKE TWO TABLETS BY MOUTH TWICE DAILY WITH MEALS   RIVAROXABAN (XARELTO) 15 MG TABS TABLET    Take 15 mg by mouth at bedtime.   TRAMADOL (ULTRAM) 50 MG TABLET    TAKE ONE TABLET BY MOUTH THREE TIMES DAILY AS NEEDED FOR PAIN

## 2014-04-24 NOTE — Patient Instructions (Signed)
Today we updated your med list in our EPIC system...    Continue your current medications the same...  Today we checked your follow up CXR & FASTING blood work...    We will contact you w/ the results when available...   Stay as active as possible...  Call for any questions...  Let's plan a follow up visit in 1mo, sooner if needed for problems.Marland KitchenMarland Kitchen

## 2014-04-29 ENCOUNTER — Ambulatory Visit: Payer: Self-pay | Admitting: Internal Medicine

## 2014-05-02 ENCOUNTER — Encounter: Payer: Self-pay | Admitting: Internal Medicine

## 2014-05-02 ENCOUNTER — Ambulatory Visit (INDEPENDENT_AMBULATORY_CARE_PROVIDER_SITE_OTHER): Payer: Medicare Other | Admitting: Internal Medicine

## 2014-05-02 VITALS — BP 142/68 | HR 78 | Ht 65.0 in | Wt 125.0 lb

## 2014-05-02 DIAGNOSIS — I2510 Atherosclerotic heart disease of native coronary artery without angina pectoris: Secondary | ICD-10-CM

## 2014-05-02 DIAGNOSIS — I2583 Coronary atherosclerosis due to lipid rich plaque: Secondary | ICD-10-CM

## 2014-05-02 DIAGNOSIS — I5042 Chronic combined systolic (congestive) and diastolic (congestive) heart failure: Secondary | ICD-10-CM

## 2014-05-02 DIAGNOSIS — I10 Essential (primary) hypertension: Secondary | ICD-10-CM | POA: Diagnosis not present

## 2014-05-02 NOTE — Patient Instructions (Signed)
Medication Instructions:  Your physician recommends that you continue on your current medications as directed. Please refer to the Current Medication list given to you today.   Labwork: None orderd  Testing/Procedures: None ordred  Follow-Up: Your physician wants you to follow-up in: 6 months with Dr Knox Saliva will receive a reminder letter in the mail two months in advance. If you don't receive a letter, please call our office to schedule the follow-up appointment.   Any Other Special Instructions Will Be Listed Below (If Applicable).

## 2014-05-02 NOTE — Progress Notes (Signed)
HPI [name] returns today for followup. She has a h/o chronic atrial fibrillation, HTN, CAD s/p stent to the RCA in 2014.  When I saw her last several months ago, she complained of increasing dyspnea and fatigue. Subsequent evaluation demonstrated moderate mitral regurgitation, severe tricuspid regurgitation, and moderate pulmonary artery hypertension, with pulmonary artery pressures of 50. Our recommendation was to continue diuretic therapy. In the interim, she denies chest pain but does have chronic sob. She has class 2 symptoms. No syncope. Allergies  Allergen Reactions  . Peanut-Containing Drug Products Anaphylaxis and Swelling  . Azithromycin Other (See Comments)    REACTION: pt states INTOL to ZPak  . Flomax [Tamsulosin Hcl] Swelling  . Pioglitazone Other (See Comments)    edema  . Pregabalin Other (See Comments)    REACTION: pt states INTOL to Lyrica  . Sulfonamide Derivatives Swelling     Current Outpatient Prescriptions  Medication Sig Dispense Refill  . acetaminophen (TYLENOL) 500 MG tablet Take 500 mg by mouth every 6 (six) hours as needed for mild pain.    [name] atenolol (TENORMIN) 50 MG tablet TAKE ONE TABLET BY MOUTH ONCE DAILY-MUST BE SEEN 60 tablet 0  . atorvastatin (LIPITOR) 40 MG tablet TAKE ONE TABLET BY MOUTH ONCE DAILY AT 6PM 30 tablet 6  . Blood Glucose Monitoring Suppl (ACCU-CHEK AVIVA PLUS) W/DEVICE KIT Test blood sugar once daily 1 kit 0  . diltiazem (CARDIZEM CD) 120 MG 24 hr capsule TAKE ONE CAPSULE BY MOUTH ONCE DAILY 30 capsule 11  . furosemide (LASIX) 20 MG tablet Take 2 tablets (40 mg total) by mouth daily. 60 tablet 11  . gabapentin (NEURONTIN) 100 MG capsule TAKE ONE CAPSULE BY MOUTH THREE TIMES DAILY 90 capsule 0  . glipiZIDE (GLUCOTROL) 10 MG tablet Take 2 tablets by mouth every morning    . glucose blood test strip Use as instructed 100 each 0  . magnesium oxide (MAG-OX) 400 (241.3 MG) MG tablet TAKE ONE TABLET BY MOUTH TWICE DAILY 60 tablet  0  . metFORMIN (GLUCOPHAGE) 500 MG tablet TAKE TWO TABLETS BY MOUTH once  DAILY WITH MEALS    . nitroGLYCERIN (NITROSTAT) 0.4 MG SL tablet Place 1 tablet (0.4 mg total) under the tongue every 5 (five) minutes as needed for chest pain (up to 3 doses). 25 tablet 3  . Rivaroxaban (XARELTO) 15 MG TABS tablet Take 1 tablet (15 mg total) by mouth 2 (two) times daily with a meal. 70 tablet 0  . vitamin B-12 (CYANOCOBALAMIN) 1000 MCG tablet Take 1,000 mcg by mouth every morning.      No current facility-administered medications for this visit.     Past Medical History  Diagnosis Date  . HTN (hypertension)   . Pure hypercholesterolemia   . Type II or unspecified type diabetes mellitus without mention of complication, not stated as uncontrolled   . Anxiety state, unspecified   . Diaphragmatic hernia without mention of obstruction or gangrene   . Benign neoplasm of colon   . Mixed incontinence urge and stress (female)(female)   . Osteoarthrosis, unspecified whether generalized or localized, unspecified site   . Lumbago   . Osteoporosis, unspecified   . Memory loss   . Renal infarction     01/2011 in setting of low INR  . History of stroke   . AF (atrial fibrillation)     a. Chronic Xarelto  . CAD (coronary artery disease)     a. 05/2012 NSTEMI/Cath/PCI: LM nl, LAD min  irregs, LCX min irregs, RI 40-50 ost, OM1 nl, RCA dom, 49m(4.0x12 Vision BMS), PD/PL nl, EF 55%, at least mod MR. b. NSTEMI 03/2013 - cath with widely patent coronaries, suspect demand ischemia due to RVR.  . Mitral regurgitation     a. mild by echo 06/2012.  .Marland KitchenChronic combined systolic and diastolic CHF (congestive heart failure)     a. 01/2011 Echo: EF 40-45%, diff HK, diast dysfxn, mild to mod MR, mod to sev dil LA/RV/RA, mod to sev reduced RV fxn, mod TR, PASP 434mg.;  b.  Echo 06/15/12:  EF 55%, mild MR, severe BAE, mod TR, PASP 35  . Hx of cardiovascular stress test     a. Myoview in 2006 was negative for ischemia, EF 54%. (WMartin Majesticon to have CAD development 05/2012.)  . Epistaxis     a. s/p cauterization 11/2012.  . Marland Kitchenypomagnesemia     ROS:   All systems reviewed and negative except as noted in the HPI.   Past Surgical History  Procedure Laterality Date  . Cholecystectomy    . Rt.leg surgery  11/2010  . Cataract surgery/both eyes      01/2012 left eye---02/2012 right eye  . Left heart catheterization with coronary angiogram N/A 05/28/2012    Procedure: LEFT HEART CATHETERIZATION WITH CORONARY ANGIOGRAM;  Surgeon: MiSherren MochaMD;  [facility];  Service: Cardiovascular;  Laterality: N/A;  . Percutaneous coronary stent intervention (pci-s)  05/28/2012    Procedure: PERCUTANEOUS CORONARY STENT INTERVENTION (PCI-S);  Surgeon: MiSherren MochaMD;  [facility];  Service: Cardiovascular;;  . Left heart catheterization with coronary angiogram N/A 03/14/2013    Procedure: LEFT HEART CATHETERIZATION WITH CORONARY ANGIOGRAM;  Surgeon: HeSinclair GroomsMD;  [facility];  Service: Cardiovascular;  Laterality: N/A;     Family History  Problem Relation Age of Onset  . Stomach cancer Father   . Pneumonia Mother   . CAD Brother      History   Social History  . Marital Status: Widowed    Spouse Name: N/A  . Number of Children: 6  . Years of Education: N/A   Occupational History  .     Social History Main Topics  . Smoking status: Never Smoker   . Smokeless tobacco: Never Used  . Alcohol Use: No  . Drug Use: No  . Sexual Activity: No   Other Topics Concern  . Not on file   Social History Narrative   1 sister--in poor health   1 brother hx of kidney stones and heart problems     BP 142/68 mmHg  Pulse 78  Ht 5' 5"  (1.651 m)  Wt 125 lb (56.7 kg)  BMI 20.80 kg/m2  Physical Exam:  stable appearing elderly woman, NAD HEENT: Unremarkable Neck:  No JVD, no thyromegally Back:  No CVA tenderness Lungs:  Clear with no wheezes HEART:  IRegular rate rhythm, no murmurs, no  rubs, no clicks Abd:  soft, positive bowel sounds, no organomegally, no rebound, no guarding Ext:  2 plus pulses, no edema, no cyanosis, no clubbing Skin:  No rashes no nodules Neuro:  CN II through XII intact, motor grossly intact  EKG -atrial fibrillation with a controlled VR    Assess/Plan:

## 2014-05-04 NOTE — Assessment & Plan Note (Signed)
She has had no recurrent anginal symptoms. She will continue her current meds.I have asked the patient to increase her physical activity.

## 2014-05-04 NOTE — Assessment & Plan Note (Signed)
No change in medical therapy. Her EF is slightly down. She will avoid sodium and I have asked that she increase her physical activity.

## 2014-05-04 NOTE — Assessment & Plan Note (Signed)
Her blood pressure is elevated. She is encouraged to reduce her salt intake. She will contiue her current meds.

## 2014-05-07 ENCOUNTER — Telehealth: Payer: Self-pay | Admitting: *Deleted

## 2014-05-07 NOTE — Telephone Encounter (Signed)
Patient called and stated that she had been out of xarelto for a few days and wanted to know if we had any samples. I told her that I did not think that we had any, but we could get some in tomorrow. She told me that she had some of the xarelto 20mg  samples that the nurse gave her at her last ov and wanted to know if she could split them as her dose is only 15mg . I advised her that if she split them that would not give her the correct dose. She then asked if she could just take the whole tablet. I advised her that would not be advisable either. I offered to send her in an rx and stressed the importance of not missing a dose of this medication. She said that was ok, but she wanted to know how much it would cost and I informed her that I was unsure of the cost. When we hung up, I looked in the sample closet and found two bottles of the 15mg . I called her back and she will have someone bring her to pick these up instead of going to the pharmacy.

## 2014-05-12 ENCOUNTER — Other Ambulatory Visit: Payer: Self-pay | Admitting: Internal Medicine

## 2014-06-03 ENCOUNTER — Telehealth: Payer: Self-pay | Admitting: Internal Medicine

## 2014-06-03 NOTE — Telephone Encounter (Signed)
New Message       Pt calling stating that she takes Xarelto 15 mg and only has 1 left and called for samples and we don't have any. I told pt that she could get her prescription refilled and pt states she never had a prescription because she always gets samples. Please call back and advise.

## 2014-06-03 NOTE — Telephone Encounter (Signed)
Samples were available/ pt was called and informed they were placed at the front desk. Pt happy with information.

## 2014-06-06 ENCOUNTER — Other Ambulatory Visit: Payer: Self-pay | Admitting: Internal Medicine

## 2014-06-14 ENCOUNTER — Other Ambulatory Visit: Payer: Self-pay | Admitting: Internal Medicine

## 2014-06-26 LAB — HM DIABETES EYE EXAM

## 2014-07-04 ENCOUNTER — Other Ambulatory Visit: Payer: Self-pay | Admitting: Pulmonary Disease

## 2014-07-04 ENCOUNTER — Other Ambulatory Visit: Payer: Self-pay | Admitting: Internal Medicine

## 2014-07-16 ENCOUNTER — Encounter: Payer: Self-pay | Admitting: Pulmonary Disease

## 2014-08-07 ENCOUNTER — Other Ambulatory Visit: Payer: Self-pay | Admitting: Pulmonary Disease

## 2014-08-25 ENCOUNTER — Telehealth: Payer: Self-pay

## 2014-08-25 NOTE — Telephone Encounter (Signed)
Samples of Xarelto 15mg  4 bottles given to patient. Also enclosed PA Forms for patient to complete.

## 2014-09-26 ENCOUNTER — Telehealth: Payer: Self-pay

## 2014-09-26 NOTE — Telephone Encounter (Signed)
Patient Assistance Application faxed to [employer] PA program for xarelto.

## 2014-10-24 ENCOUNTER — Ambulatory Visit (INDEPENDENT_AMBULATORY_CARE_PROVIDER_SITE_OTHER): Payer: Medicare Other | Admitting: Pulmonary Disease

## 2014-10-24 VITALS — BP 138/64 | HR 64 | Temp 98.0°F | Wt 127.8 lb

## 2014-10-24 DIAGNOSIS — I2510 Atherosclerotic heart disease of native coronary artery without angina pectoris: Secondary | ICD-10-CM

## 2014-10-24 DIAGNOSIS — R269 Unspecified abnormalities of gait and mobility: Secondary | ICD-10-CM

## 2014-10-24 DIAGNOSIS — E7800 Pure hypercholesterolemia, unspecified: Secondary | ICD-10-CM

## 2014-10-24 DIAGNOSIS — K449 Diaphragmatic hernia without obstruction or gangrene: Secondary | ICD-10-CM

## 2014-10-24 DIAGNOSIS — I4821 Permanent atrial fibrillation: Secondary | ICD-10-CM

## 2014-10-24 DIAGNOSIS — E1140 Type 2 diabetes mellitus with diabetic neuropathy, unspecified: Secondary | ICD-10-CM

## 2014-10-24 DIAGNOSIS — I10 Essential (primary) hypertension: Secondary | ICD-10-CM

## 2014-10-24 DIAGNOSIS — M810 Age-related osteoporosis without current pathological fracture: Secondary | ICD-10-CM

## 2014-10-24 DIAGNOSIS — Z23 Encounter for immunization: Secondary | ICD-10-CM

## 2014-10-24 DIAGNOSIS — M159 Polyosteoarthritis, unspecified: Secondary | ICD-10-CM

## 2014-10-24 DIAGNOSIS — R413 Other amnesia: Secondary | ICD-10-CM

## 2014-10-24 DIAGNOSIS — I5042 Chronic combined systolic (congestive) and diastolic (congestive) heart failure: Secondary | ICD-10-CM | POA: Diagnosis not present

## 2014-10-24 DIAGNOSIS — N3946 Mixed incontinence: Secondary | ICD-10-CM

## 2014-10-24 DIAGNOSIS — M150 Primary generalized (osteo)arthritis: Secondary | ICD-10-CM

## 2014-10-24 DIAGNOSIS — E1165 Type 2 diabetes mellitus with hyperglycemia: Secondary | ICD-10-CM

## 2014-10-24 DIAGNOSIS — IMO0002 Reserved for concepts with insufficient information to code with codable children: Secondary | ICD-10-CM

## 2014-10-24 DIAGNOSIS — I482 Chronic atrial fibrillation: Secondary | ICD-10-CM | POA: Diagnosis not present

## 2014-10-24 MED ORDER — TRAMADOL HCL 50 MG PO TABS: ORAL_TABLET | ORAL | Status: DC

## 2014-10-24 NOTE — Patient Instructions (Signed)
Today we updated your med list in our EPIC system...    Continue your current medications the same...  Continue yout TRAMADOL 50mg  tabs- taking 1/2 tab w/ TYLENOL up to 3 times daily for your back pain...  Today we gave you the 2016 Flu vaccine...  Call for any questions...  Let's plan a follow up visit in 34mo, sooner if needed for problems.Marland KitchenMarland Kitchen

## 2014-10-25 ENCOUNTER — Encounter: Payer: Self-pay | Admitting: Pulmonary Disease

## 2014-10-25 NOTE — Progress Notes (Signed)
Subjective:    Patient ID: Michelle Herrera, female    DOB: 04/19/1930, 81 y.o.   MRN: [id number]  HPI 82 y/o WF here for a follow up visit... SEE PREV EPIC NOTES FOR OLDER DATA >>     Adm 1/13 w/ renal infarcts felt due to micro-emboli & inadeq INR ==> changed to [place].  Hosp 5/14 for NSTEMI=> stent to RCA.  LABS 10/14:  FLP- looks good on Lip40;  Chems- ok x BS=181, A1c=8.8.Marland KitchenMarland Kitchen  Hosp 3/15 w/ NSTEMI & cath showed widely patent coronaries & EF=50%.  ~  April 24, 2013:  79moROV & [name] has had an eventful interval> followed by DrTaylor for Cards w/ AFib, CAD, biventric failure, pulmHTN;  Episode severe epistaxis 11/14 requiring electocautery & ASA was stopped & Xarelto was decreased to 167md;  She was Hosp by [facility] 1/11 - 01/22/13 w/ incr SOB & palpit due to AFib w/ RVR and DiastolicCHF, she was diuresed, meds adjusted, and improved;  She was again Hosp 3/3 - 03/15/13 by [facility] w/ CP, AFib & RVR, NSTEMI likely from demand ischemia since cath showed widely patent coronaries, EF=50% w/ mild inferior wall HK;  She had f/u w/ [name] 3/15 & doing satis...  We reviewed the following medical problems during today's office visit >>     HBP> on Aten50Bid, Diltiazem120,  & Lasix20; BP= 110/64 & she denies CP/ SOB/ edema, notes occas palpit...    CAD, Cardiomyopathy (sys & diast CHF), Valvular Dis w/ MR> off ASA81 due to epistaxis, on Xarelto15, Aten50Bid, Diltiazem120, Lasix20; Adm 5/14 by [facility] w/ NSTEMI, Cath=> stent to 95%RCA; followed by DrTaylor; Adm 1/15 & 3/15 w/ AFib, RVR, NSTEMI due to demand ischemia and recath w/ widely patent coronaries, EF=50%; meds adjusted...    AFib> on Xarelto15 due to epistaxis; followed by DrTaylor & cardiology notes are reviewed...    Chol> on Lip40; weight is down to 109#, BMI=18; FLP 10/14 shows TChol 138, TG 63, HDL 65, LDL 61    DM w/ neuropathy> on Metform500-2Bid, Glipiz10-2Bid; prev followed by DrEllison but last seen 6/13- Actos stopped due to edema; won't  take additional meds due to cost; she claims that BS higher when her knee is hurting; Labs 10/14 showed BS=181, A1c=8.8 & Labs 4/15 showed BS=181, A1c=9.3;  I called pharm [facility] on [name]- she has filled #120 of each in March & April=> continue same & better low carb diet (may need the inexpensive 70/30 insulin)    GI- HH, colon polyps, s/p GB> stable she says, not on regular PPI dosing, last colon was 2003 by DrStark w/ 40m68molyp & divertics; denies abd pain, n/v, d/c, blood seen...    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    DJD, LBP, right shoulder pain> on Neurontin100Tid; Ortho evals from DrNorris- prev knee surg for torn cartilage, back discomfort is improved she says...    Osteoporosis, Vit D defic> Vit D level 2013 = 30 and she was asked to incr her OTC Vit D supplement to 2000u daily...    Memory Loss> Hx 2 sm infarcts on prev MRI, on Xarelto from Cards; she declines Aricept etc...    Anxiety> mod stress in the family, she declines anxiolytic meds...    B12 defic> on OTC oral B12 supplement 1000u daily... We reviewed prob list, meds, xrays and labs> see below for updates >> SHE DID NOT BRING MED BOTTLES & DOES NOT KNOW  HER MEDS!!!  LABS 4/15:  Chems- ok x BS=181, A1c=9.3;  LFTs- wnl;  CBC- wnl;  Fe=121 (31%sat);    ADDENDUM>> she notes that she has no appetite & "not eating"; she has lost 21# in 64modown to 109# & BMI=18; but serum proteins WNL; we will treat w/ MEGACE 2047mbefore meals Tid...  ~  October 23, 2013:  5m90moV & [name] reports that she is feeling well, no new complaints or concerns;  Over the 5mo37moerval however she has decreased both of her DM meds in half as she says she doesn't tolerate the max doses due to "feeling bad, GI upset, & diarrhea";  She has also seen DrWhitfield for back & side pain- she reports he did XRays but doesn't know what it is & we don't have  his notes to review; she gets relief w/ Tramadol but she states 50mg34m gives her diarrhea & she is rec to take 1/2 tab w/ an ESTylenol... We reviewed the following medical problems during today's office visit >>     HBP> on Aten50Bid, Diltiazem120,  & Lasix20-2Qam; BP= 110/64 & she denies CP/ SOB/ edema, notes occas palpit...    CAD, Cardiomyopathy (sys & diast CHF), Valvular Dis w/ MR> off ASA81 due to epistaxis, on Xarelto15 (lowered dose due to nose bleed) & above meds; Adm 5/14 by [facility] w/ NSTEMI, Cath=> stent to 95%RCA; followed by DrTaylor; Adm 1/15 & 3/15 w/ AFib, RVR, NSTEMI due to demand ischemia and recath w/ widely patent coronaries, EF=50%; meds adjusted...    AFib, CHB & Pacer> on Xarelto15 due to epistaxis; followed by DrTaylor & seen 8/15> cardiology notes are reviewed...    Chol> on Lip40; weight is up to 116#, BMI=19; FLP 10/15 shows TChol 127, TG 54, HDL 69, LDL 47    DM w/ neuropathy> prev on Metform500-2Bid & Glipiz10-2Bid but INTOL she says & decreased to 2 of each Qam only; prev followed by DrEllison but last seen 6/13- Actos stopped due to edema; won't take additional meds due to cost; refuses insulin shots; she claims that BS higher when her knee is hurting; Labs 10/15 showed BS=172, A1c=8.4 (sl improved) & asked to ADD Metform500 & Glipiz10 (one of each) in PM at dinner...     GI- HH, colon polyps, s/p GB> stable she says, not on regular PPI dosing, last colon was 2003 by DrStark w/ 5mm p75mp & divertics; denies abd pain, n/v, d/c, blood seen...    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     DJD, LBP, right shoulder pain> on Neurontin100Tid; Ortho evals from DrNorris/ Whitfield- prev knee surg for torn cartilage, back discomfort treated w/ Tramadol &  Tylenol...    Osteoporosis, Vit D defic> Vit D level 2013 = 30 and she was asked to incr her OTC Vit D supplement to 2000u daily; needs f/u BMD but she wants to wait...    Memory Loss> Hx 2 sm infarcts on prev MRI, on Xarelto from Cards; she declines Aricept etc...    Anxiety> mod stress in the family, she declines anxiolytic meds...    B12 defic> on OTC oral B12 supplement 1000u daily... We reviewed prob list, meds, xrays and labs> see below for updates >> Given 2015 Flu vaccine today; she did not bring meds or med list  to office visit today!  LABS 10/15:  FLP- looks great on Lip40;  Chems- BS=172, A1c=8.4 on her Metform500-2Qam & glipiz10-2Qam... PLAN>>  Rec to increase her DM meds to include one of each at dinner time in PM, plus diet, exercise, etc (NOTE- she did NOT increase the meds as directed)...   ~  April 24, 2014:  27moRRio Pinarstates she's doing well- CC is hip pain & she notes that Tylenol helps;     HBP, CAD, cardiomyop (sys & diast CHF), MR> on Aten50, Cardizem120, Lasix20-2/d; BP= 110/70 & she reports doing well- no CP etc...    PAF, CHB, pacer> followed by DrTaylor on Xarelto15 (decreased due to epistaxis); she reports stable- no CP, palpit, dizzy, nose bleeds etc...    Chol> on Lip40; FLP 4/16 showed TChol 214, TG 106, HDL 86, LDL 107... We reviewed diet, continue same med.    DM> on Metform500-2Qam & Glucotrol10-2Qam; compliance is ?? She has been reluctant to follow my recs for increased meds; Labs 4/16 showed BS=175, A1c=9.4 => refer to [facility] for DM management.    Other problems as listed> on Neurontin100Tid, MVI, B12-1000/d... We reviewed prob list, meds, xrays and labs> see below for updates >>   CXR 4/16 showed mod cardiomeg, hyperinflation, sl blunt angles c/w pleural thickening, DJD Tspine, osteopenia, NAD...  LABS 4/16:  FLP- note quite at goals;  Chems- ok x BS=175, A1c=9.4;  CBC- wnl;  TSH=4.61;  BNP=438...  ADDENDUM>> she REFUSED referral to Endocrinology  for DM mamagement, refuses to increase her DM meds per my recommendations, refuses to start insulin therapy; she says that she will continue current meds and get on diet + exercise to get her BS improved & A1c better...  ~  October 24, 2014:  BCharnikareports a quiet interval, notes some LBP & this is helped by Tramadol 1/2 tab + Tylenol...    HBP, CAD, cardiomyop (sys & diast CHF), MR> on Aten50, Cardizem120, Lasix20-2/d; BP= 138/64 & she reports doing well- no CP etc; she needs incr exercise...    PAF, CHB, pacer> followed by DrTaylor on Xarelto15/d (decreased due to epistaxis); she reports stable- no CP, palpit, dizzy, nose bleeds etc...    Chol> on Lip40; FLP 4/16 showed TChol 214, TG 106, HDL 86, LDL 107... We reviewed diet, continue same med.    DM> on Metform500-2Qam & Glucotrol10-2Qam; compliance is ?? She has been reluctant to follow my recs for increased meds; Labs 4/16 showed BS=175, A1c=9.4 => refer to [facility] for DM management (she refused again).    Other problems as listed> on Neurontin100Tid, MVI, B12-1000/d... We reviewed prob list, meds, xrays and labs>  IMP/PLANS>>  Reminded to take meds regularly & again asked to bring all med bottles to every office visit; she continues to refuse to increase her DM meds or go to an endocrinologist for DM management; Her CC is the back pain but Tramadol50 makes her too groggy- rec to take 1/2 tab + Tylenol, up to Tid as needed...            Problem List:     HYPERTENSION >> VALVULAR HEART DISEASE >> ATRIAL FIBRILLATION - stable on Rx w/ XARELTO & rate control strategy. ~  4/11:  she reports that she stopped her Lanoxin 0.125 on her own & doesn't want to restart. ~  8/11:  f/u DrTaylor & stable on Coumadin w/ rate control strategy, no changes made. ~  11/11:  Hosp by [facility] w/ TIA>  2DEcho showed mild LVH, diffuse HK w/ EF= 45%, thick pos MV leaflet w/ restrict motion & modMR, dilated LA&RA, severeTR & PAsys=49, can't r/o PFO... ~  8/12:  her  BP=116/84 and she feels OK- denies HA, fatigue, visual changes, CP, palipit, dizziness, syncope, dyspnea, edema, etc... ~  12/12:  BP= 126/88 and she remains asymptomatic as above... ~  1/13:  2DEcho showed mild LVH w/ EF= 40-45% & diffuse HK, Diastolic dysfunction, modMR, severe LAE, modTR, Pasys=45 ~  She was switched from Coumadin to [facility] 1/13 due to the renal infarcts & difficulty maintaining INR. ~  CXR 1/13 showed marked cardiomegaly, pulm vasc congestion, NAD... ~  4/13:  BP= 128/60 and she is feeling better she says;  BUN=15, Creat=0.8; She had f/u DrTaylor> noted to be doing well w/o symptoms- continue same meds & low Na. ~  EKG 4/13 showed AFib, rate 77, right ward axis & NSSTTWA... ~  8/13:  BP= 110/82 and she denies current CP, palpit, SOB, edema... ~  4/14: on Aten50Bid; BP= 130/84 & she denies CP/ SOB/ edema, notes occas palpit. ~  5/14:  Adm by Cards> NSTEMI, Cath w/ LAD minor irreg only, LCirc w/ ostial 40-50%, mid RCA 95%, EF 55%. PCI: Vision BMS to the mid RCA. There was moderate MR on left ventriculogram;  ~  CXR 5/14 showed cardiomeg, underlying COPD w/ apical scarring, DJD in spine, NAD... ~  EKG 6/14 showed AFib, rate72, NSSTTWA...  ~  2DEcho 6/14 showed norm LV size & funct w/ EF=55%, mild MR, severe RA&LA dil, modTR,  ~  Adm 1/15 & 3/15 w/ CP, AFib w/ RVR, had NSTEMI 3/15 from demand ischemia; cath w/ patent coronaries, EF=50%; meds adjusted w/ stop ASA & decr Xarelto to 62m due to severe epistaxis... ~  CXR 3/15 showed cardiomegaly, hyperinflated lungs, biapical pleural thickening, NAD... ~  EKG 3/15 showed AFib, rate74, NSSTTWA... ~  4/15: on Aten50Bid, Diltiazem120, & Lasix20; also taking Xarelto15; BP= 110/64 & she denies CP/ SOB/ edema, notes occas palpit... ~  10/15:  on Aten50Bid, Diltiazem120,  & Lasix20-2Qam, Xarelto15; BP= 110/64 & she claims asymptomatic... ~  4/16: on Aten50, Cardizem120, Lasix20-2/d, Xarelto15; she reports doing well- no CP etc; BP=  110/70... ~  CXR 4/16 showed mod cardiomeg, hyperinflation, sl blunt angles c/w pleural thickening, DJD Tspine, osteopenia, NAD.  HYPERCHOLESTEROLEMIA - on diet + SIMVASTATIN 45md... ~  FLIdylwood007 showed TChol 160, TG 143, HDL 55, LDL 76 ~  FLP 7/09 showed TChol 165, TG 129, HDL 60, LDL 80 ~  FLP 10/10 showed TChol 175, TG 68, HDL 80, LDL 82 ~  FLP 4/11 showed TChol 207, TG 104, HDL 84, LDL 111... rec better diet, continue same med. ~  FLP 11/11 on Simva40 showed TChol 180, TG 71, HDL 67, LDL 99 ~  FLP 8/12 on Simva40 showed TChol 196, TG 96, HDL 89, LDL 88 ~  FLP 4/13 on Simva40 showed TChol 211, TG 77, HDL 85, LDL 112... rec better diet, same med. ~  FLP 10/14 on Lip40 showed TChol 138, TG 63, HDL 65, LDL 61  ~  FLP 10/15 on Lip40 showed  TChol 127, TG 54, HDL 69, LDL 47 ~  FLP 4/16 on Lip40 showed TChol 214, TG 106, HDL 86, LDL 107   TYPE 2 DIABETES MELLITUS, Uncontrolled, w/ Neuropathy >>  ~  on METFORMIN 50048m2tabsBid, GLIPZIDE 86m40m (not taking Januvia due to $$)... ~  labs 1/09 showed BS= 148, HgA1c= 7.1... cMarland KitchenMarland Kitchentinue meds +  better diet... ~  labs 7/09 showed BS= 168, HgA1c= 7.6.Marland KitchenMarland Kitchen incr Metform 2Bid, contin Glip 10Bid & Januv... ~  she stopped the Januvia due to cost... ~  labs 4/10 showed BS= 185, Aic= 8.0.Marland KitchenMarland Kitchen reminder to take meds regularly. ~  labs 10/10 on Metform2Bid+Glip10Bid showed BS= 124, A1c= 6.7 ~  labs 4/11 showed BS= 162, A1c= 6.8.[name]. rec> better diet, take meds regularly. ~  labs 10/11 showed BS= 322, A1c= 8.3.Marland KitchenMarland Kitchen Pharm confirms not taking meds regularly!!! Discussed w/ pt... ~  Labs 4/12 showed BS= 91, A1c= 7.9.Marland KitchenMarland Kitchen Rec> keep same meds, take regularly, better low carb diet... ~  Labs 8/12 on Metform2Bid+Glip10Bid showed BS= 114, A1c= 7.3.Marland KitchenMarland Kitchen Continue same... ~  Labs in hosp 1/13 showed BS~ 153-209 & A1c=7.4... NOTE: she has refused other meds due to $$$ ~  Labs 4/13 on Metform2Bid+Glipiz10Bid showed BS= 208, A1c= 8.6... She is referred to Endocrinology ~  She saw  DrEllison but refused Actos, refused Gliptins, etc... ~  Labs 8/13 on Metform2Bid+Glipiz10Bid showed BS= 163, A1c= 7.5.Marland KitchenMarland Kitchen Continue same + better diet... ~  Labs 10/14 on Metform2Bid+Glipiz10Bid showed BS= 181, A1c= 8.8.Marland KitchenMarland Kitchen rec better diet & incr Glipiz10-2Bid... ~  Labs 4/15 on Metform500-2Bid & Glipizide10-2Bid showed BS= 181, A1c= 9.3; she refuses additional meds; she cut back both meds to just Qam due to "side effects"... ~  Labs 10/15 on Metform500-2Qam & Glipiz10-2Qam showed BS= 172, A1c= 8.4 and REC to add one of each at dinner in the PM...  ~  Labs 4/16 on Metform500-2Qam & Glipiz10-2Qam showed BS=175, A1c=9.4 (compliance is ?? She has been reluctant to follow my recs for increased meds) => refer to [facility] for DM management.  HIATAL HERNIA (ICD-553.3) - last EGD by DrStark was 3/03 and showed a HH, reflux...  COLONIC POLYPS (ICD-211.3) - last colonoscopy 3/03 by DrStark showed divertics and 69m polyp (no path avail).  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 pt reports that there is nothing they can do to help... ~  she has had several UTI's... then perineal rash & saw GYN= neg PAP & Rx yeast infection- resolved. ~  She will f/u w/ Urology for persistant symptoms, seen by DrEskridge w/ trial Myrbetriq but not much benefit per pt... ~  7/15: referred to DrEskridge by Ortho after MRI spine showed ?bilat hydro; reviewed by Urology & they disagreed- no hydro but she has hx renal infarct 1/13, bilat extrarenal pelvis, & left peri-pelvic cysts, small bladder capacity, known cystocele...  DEGENERATIVE JOINT DISEASE (ICD-715.90) - she needs right TKR but wants to put this off as long as poss... ~  4/12:  Ortho eval by DrNorris, hx prev knee arthroscopies, given Cortisone shot, & he plans ?Synvisc series... ~  9/12:  outpt knee surg to repair torn cartilage per DrNorris; preop clearance by  DrTaylor & [facility]...  BACK PAIN, LUMBAR (ICD-724.2) - XRays showed scoliosis, facet degen arthritis, & osteopenia...  she has used ROBAXIN Prn... ~  4/12:  She reports eval from DrNorris w/ "arthritis all down my backbone" & he rec epid steroid injection, but she reports improved on NEURONTIN 1075mTid... ~  4/13:  She reports that back pain has improved but she still has some leg pain but notes that "it's tolerable"...  SHOULDER PAIN, RIGHT (ICD-719.41) - prev eval & Rx by DrRendall... s/p right shoulder surg 3/09... XRays showed calcium deposit, rotator cuff prob, & intol to Tramadol... ~  She tells me  she wants to f/u w/ DrWhitfield for Ortho...  OSTEOPOROSIS (ICD-733.00) ~  labs 10/10 w/ Vit D level = 32... rec> start Vit D OTC 1000 u daily. ~  Labs 4/13 showed Vit D level = 30... rec to incr to 2000u daily...  MEMORY LOSS (ICD-780.93) - concern for her memory expressed 7/09 OV- tried Aricept but she stopped it- "no different". TIA/ INFARCT - she was Hosp 11/11 by DrWillis w/ left sided weakness, ?left facial droop, & MRI showed 2 sm infarcts on right (frontal & ?pontine);  MRA was neg;  CDopplers were neg;  Deficits all cleared rapidly;  2DEcho was abn- see above;  She was continued on her Coumadin Rx- no changes made... ~  4/13:  She was offered memory medications but she declined...  ANXIETY (ICD-300.00) - stress in family... 2 y/o granddau w/ seizures...  VITAMIN B12 DEFICIENCY >> on OTC Vit B-12 tabs orally> 1050mg/d... ~  Labs 7/11 showed Vit V12 level = 135... Started on Vit B12 supplement w/ 10071m/d... ~  Labs 4/13 showed Vit B12 level = 905... Ok top decr to 1/2 tab daily...   Past Surgical History  Procedure Laterality Date  . Cholecystectomy    . Rt.leg surgery  11/2010  . Cataract surgery/both eyes      01/2012 left eye---02/2012 right eye  . Left heart catheterization with coronary angiogram N/A 05/28/2012    Procedure: LEFT HEART CATHETERIZATION WITH CORONARY  ANGIOGRAM;  Surgeon: MiSherren MochaMD;  [facility];  Service: Cardiovascular;  Laterality: N/A;  . Percutaneous coronary stent intervention (pci-s)  05/28/2012    Procedure: PERCUTANEOUS CORONARY STENT INTERVENTION (PCI-S);  Surgeon: MiSherren MochaMD;  [facility];  Service: Cardiovascular;;  . Left heart catheterization with coronary angiogram N/A 03/14/2013    Procedure: LEFT HEART CATHETERIZATION WITH CORONARY ANGIOGRAM;  Surgeon: HeSinclair GroomsMD;  [facility];  Service: Cardiovascular;  Laterality: N/A;    Outpatient Encounter Prescriptions as of 10/24/2014  Medication Sig  . acetaminophen (TYLENOL) 500 MG tablet Take 500 mg by mouth every 6 (six) hours as needed for mild pain.  . Marland Kitchentenolol (TENORMIN) 50 MG tablet TAKE ONE TABLET BY MOUTH ONCE DAILY  . atorvastatin (LIPITOR) 40 MG tablet TAKE ONE TABLET BY MOUTH ONCE DAILY AT 6PM  . Blood Glucose Monitoring Suppl (ACCU-CHEK AVIVA PLUS) W/DEVICE KIT Test blood sugar once daily  . diltiazem (CARDIZEM CD) 120 MG 24 hr capsule TAKE ONE CAPSULE BY MOUTH ONCE DAILY  . furosemide (LASIX) 20 MG tablet TAKE TWO TABLETS BY MOUTH ONCE DAILY  . gabapentin (NEURONTIN) 100 MG capsule TAKE ONE CAPSULE BY MOUTH THREE TIMES DAILY  . glipiZIDE (GLUCOTROL) 10 MG tablet Take 2 tablets by mouth every morning  . glucose blood test strip Use as instructed  . magnesium oxide (MAG-OX) 400 (241.3 MG) MG tablet TAKE ONE TABLET BY MOUTH TWICE DAILY  . metFORMIN (GLUCOPHAGE) 500 MG tablet TAKE TWO TABLETS BY MOUTH once  DAILY WITH MEALS  . nitroGLYCERIN (NITROSTAT) 0.4 MG SL tablet Place 1 tablet (0.4 mg total) under the tongue every 5 (five) minutes as needed for chest pain (up to 3 doses).  . Rivaroxaban (XARELTO) 15 MG TABS tablet Take 1 tablet (15 mg total) by mouth 2 (two) times daily with a meal.  . vitamin B-12 (CYANOCOBALAMIN) 1000 MCG tablet Take 1,000 mcg by mouth every morning.   . traMADol (ULTRAM) 50 MG tablet Take  1/2-1 tablet by mouth three times a  day as needed for pain  . [DISCONTINUED] gabapentin (NEURONTIN) 100 MG capsule TAKE ONE CAPSULE BY MOUTH THREE TIMES DAILY  . [DISCONTINUED] metFORMIN (GLUCOPHAGE) 500 MG tablet TAKE TWO TABLETS BY MOUTH TWICE DAILY WITH  MEALS.   No facility-administered encounter medications on file as of 10/24/2014.     Allergies  Allergen Reactions  . Peanut-Containing Drug Products Anaphylaxis and Swelling  . Azithromycin Other (See Comments)    REACTION: pt states INTOL to ZPak  . Flomax [Tamsulosin Hcl] Swelling  . Pioglitazone Other (See Comments)    edema  . Pregabalin Other (See Comments)    REACTION: pt states INTOL to Lyrica  . Sulfonamide Derivatives Swelling    Review of Systems        See HPI - all other systems neg except as noted... The patient complains of weight loss, chest pain, dyspnea on exertion, and muscle weakness.  The patient denies anorexia, fever, weight gain, vision loss, decreased hearing, hoarseness, syncope, peripheral edema, prolonged cough, headaches, hemoptysis, abdominal pain, melena, hematochezia, severe indigestion/heartburn, hematuria, incontinence, suspicious skin lesions, transient blindness, difficulty walking, depression, unusual weight change, abnormal bleeding, enlarged lymph nodes, and angioedema.     Objective:   Physical Exam      WD, WN, 81 y/o WF in NAD...  GENERAL:  Alert & oriented; pleasant & cooperative... HEENT:  [place]/AT, EOM-wnl, PERRLA, EACs-clear, TMs-wnl, NOSE-clear, THROAT-clear & wnl. NECK:  Supple w/ fairROM; no JVD; normal carotid impulses w/o bruits; no thyromegaly or nodules palpated; no lymphadenopathy. CHEST:  Clear to P & A; without wheezes/ rales/ or rhonchi; +tender left 11th-12th ribs & costal margin. HEART:  Iregular Rhythm= AFib; without murmurs/ rubs/ or gallops heard... ABDOMEN:  Soft & nontender; normal bowel sounds; no organomegaly or masses detected. EXT: without deformities, mild  arthritic changes; no varicose veins/ venous insuffic/ or edema. NEURO:  CN's intact;  no focal neuro deficits found... DERM:  No lesions noted; no rash etc...  RADIOLOGY DATA:  Reviewed in the EPIC EMR & discussed w/ the patient...  LABORATORY DATA:  Reviewed in the EPIC EMR & discussed w/ the patient...   Assessment & Plan:    HBP>  Stable on BBlocker, CCB, Lasix20; continue same... SHE DOES NOT KNOW HER MEDS & DID NOT BRING BOTTLES.  CAD>  Hosp 5/14 for NSTEMI=> stent to RCA; Hosp 3/15 w/ NSTEMI & cath showed widely patent coronaries & EF=50%...  AFIB>  She had abn 2DEcho in Hosp 11/11 & 1/13- reviewed by Cards- she sees DrTaylor et al & on XARELTO15 due to epistaxis...  CHOL>  FLP looked fair on the Simva40 + diet rx; switched to Lip40 during the 5/14 [facility] for NSTEMI & FLP looks good...  DM>  A1c is 8.4 now on Metform500-2Qam & Glipiz10-2Qam & she refused additional meds, A1c now up to 9.4 => refer to [facility] for DM management...  RENAL INFARCTION>  Adm 1/13 w/ renal infarcts felt due to micro-emboli & inadeq INR ==> changed to XARELTO... URINARY INCONTINENCE>  Followed by Eli Hose now Eskridge & she feels that there is nothing they can do to help her; wears pads; offered second opinion & she will consider it...  ORTHO>  Followed by DrNorris/ [facility] & she is s/p cortisone shots in right knee & ?Synvisc series; she had arthroscopic surg for torn cartilage 9/12 she says & she doesn't want TKR.  STROKE>  She has had a CVA w/ hosp by Neuro [facility]);  She was continued on  her Coumadin per DrWillis & followed in [facility]=> changed to Xarelto 2013 as noted;  Stable w/o recurrent cerebral ischemic symptoms...  Other medical problems as noted... She will continue the Calcium, MVI, Vit D, & Vit B12 regimen...   Patient's Medications  New Prescriptions   TRAMADOL (ULTRAM) 50 MG TABLET    Take 1/2-1 tablet by mouth three times a day as needed for pain   Previous Medications   ACETAMINOPHEN (TYLENOL) 500 MG TABLET    Take 500 mg by mouth every 6 (six) hours as needed for mild pain.   ATENOLOL (TENORMIN) 50 MG TABLET    TAKE ONE TABLET BY MOUTH ONCE DAILY   ATORVASTATIN (LIPITOR) 40 MG TABLET    TAKE ONE TABLET BY MOUTH ONCE DAILY AT 6PM   BLOOD GLUCOSE MONITORING SUPPL (ACCU-CHEK AVIVA PLUS) W/DEVICE KIT    Test blood sugar once daily   DILTIAZEM (CARDIZEM CD) 120 MG 24 HR CAPSULE    TAKE ONE CAPSULE BY MOUTH ONCE DAILY   FUROSEMIDE (LASIX) 20 MG TABLET    TAKE TWO TABLETS BY MOUTH ONCE DAILY   GABAPENTIN (NEURONTIN) 100 MG CAPSULE    TAKE ONE CAPSULE BY MOUTH THREE TIMES DAILY   GLIPIZIDE (GLUCOTROL) 10 MG TABLET    Take 2 tablets by mouth every morning   GLUCOSE BLOOD TEST STRIP    Use as instructed   MAGNESIUM OXIDE (MAG-OX) 400 (241.3 MG) MG TABLET    TAKE ONE TABLET BY MOUTH TWICE DAILY   METFORMIN (GLUCOPHAGE) 500 MG TABLET    TAKE TWO TABLETS BY MOUTH once  DAILY WITH MEALS   NITROGLYCERIN (NITROSTAT) 0.4 MG SL TABLET    Place 1 tablet (0.4 mg total) under the tongue every 5 (five) minutes as needed for chest pain (up to 3 doses).   RIVAROXABAN (XARELTO) 15 MG TABS TABLET    Take 1 tablet (15 mg total) by mouth 2 (two) times daily with a meal.   VITAMIN B-12 (CYANOCOBALAMIN) 1000 MCG TABLET    Take 1,000 mcg by mouth every morning.   Modified Medications   No medications on file  Discontinued Medications   GABAPENTIN (NEURONTIN) 100 MG CAPSULE    TAKE ONE CAPSULE BY MOUTH THREE TIMES DAILY   METFORMIN (GLUCOPHAGE) 500 MG TABLET    TAKE TWO TABLETS BY MOUTH TWICE DAILY WITH  MEALS.

## 2014-10-28 ENCOUNTER — Telehealth: Payer: Self-pay | Admitting: Internal Medicine

## 2014-10-28 NOTE — Telephone Encounter (Signed)
Ne wMessage  Pt requesting a call back concerning samples--- Patient calling the office for samples of medication:   1.  What medication and dosage are you requesting samples for? Xarelto   2.  Are you currently out of this medication? Yes-   3. Are you requesting samples to get you through until you receive your prescription? Pt was denied from [employer]

## 2014-10-29 ENCOUNTER — Other Ambulatory Visit: Payer: Self-pay | Admitting: Internal Medicine

## 2014-10-29 NOTE — Telephone Encounter (Signed)
Called pt's son to let him know his mother's sample is up front at the desk. Wife answered, left message with her.

## 2014-10-29 NOTE — Telephone Encounter (Signed)
Follow up     Patient calling the office for samples of medication:   1.  What medication and dosage are you requesting samples for? xarelto 15mg   2.  Are you currently out of this medication? No have approx 2 boxes of samples left  3. Are you requesting samples to get you through until you receive your prescription? No---cannot afford pills--50.00 thru ins

## 2014-11-17 ENCOUNTER — Other Ambulatory Visit: Payer: Self-pay | Admitting: Internal Medicine

## 2014-12-03 ENCOUNTER — Other Ambulatory Visit: Payer: Self-pay | Admitting: Internal Medicine

## 2014-12-06 ENCOUNTER — Other Ambulatory Visit: Payer: Self-pay | Admitting: Internal Medicine

## 2014-12-11 ENCOUNTER — Other Ambulatory Visit: Payer: Self-pay | Admitting: Pulmonary Disease

## 2014-12-18 ENCOUNTER — Encounter: Payer: Self-pay | Admitting: Internal Medicine

## 2014-12-18 ENCOUNTER — Ambulatory Visit (INDEPENDENT_AMBULATORY_CARE_PROVIDER_SITE_OTHER): Payer: Medicare Other | Admitting: Internal Medicine

## 2014-12-18 VITALS — BP 130/80 | HR 88 | Ht 65.0 in | Wt 129.2 lb

## 2014-12-18 DIAGNOSIS — I10 Essential (primary) hypertension: Secondary | ICD-10-CM | POA: Diagnosis not present

## 2014-12-18 DIAGNOSIS — I482 Chronic atrial fibrillation: Secondary | ICD-10-CM

## 2014-12-18 DIAGNOSIS — I25810 Atherosclerosis of coronary artery bypass graft(s) without angina pectoris: Secondary | ICD-10-CM | POA: Diagnosis not present

## 2014-12-18 DIAGNOSIS — I4821 Permanent atrial fibrillation: Secondary | ICD-10-CM

## 2014-12-18 MED ORDER — RIVAROXABAN 15 MG PO TABS: 15.0000 mg | ORAL_TABLET | Freq: Every day | ORAL | Status: DC

## 2014-12-18 MED ORDER — RIVAROXABAN 15 MG PO TABS: 15.0000 mg | ORAL_TABLET | Freq: Two times a day (BID) | ORAL | Status: DC

## 2014-12-18 NOTE — Progress Notes (Signed)
HPI Michelle Herrera returns today for followup. She has a h/o chronic atrial fibrillation, HTN, CAD s/p stent to the RCA in 2014.  When I saw her last several months ago, she complained of increasing dyspnea and fatigue. Subsequent evaluation demonstrated moderate mitral regurgitation, severe tricuspid regurgitation, and moderate pulmonary artery hypertension, with pulmonary artery pressures of 50. Our recommendation was to continue diuretic therapy. In the interim, she denies chest pain but does have chronic sob, class 2.  No syncope. She notes an episode in the store where she was having a difficult time seeing. Allergies  Allergen Reactions  . Peanut-Containing Drug Products Anaphylaxis and Swelling  . Azithromycin Other (See Comments)    REACTION: pt states INTOL to ZPak  . Flomax [Tamsulosin Hcl] Swelling  . Pioglitazone Other (See Comments)    edema  . Pregabalin Other (See Comments)    REACTION: pt states INTOL to Lyrica  . Sulfonamide Derivatives Swelling     Current Outpatient Prescriptions  Medication Sig Dispense Refill  . acetaminophen (TYLENOL) 500 MG tablet Take 500 mg by mouth every 6 (six) hours as needed for mild pain.    [name] atenolol (TENORMIN) 50 MG tablet TAKE ONE TABLET BY MOUTH ONCE DAILY 30 tablet 0  . atorvastatin (LIPITOR) 40 MG tablet TAKE ONE TABLET BY MOUTH ONCE DAILY AT 6PM 30 tablet 6  . Blood Glucose Monitoring Suppl (ACCU-CHEK AVIVA PLUS) W/DEVICE KIT Test blood sugar once daily 1 kit 0  . CARTIA XT 120 MG 24 hr capsule TAKE ONE CAPSULE BY MOUTH ONCE DAILY 30 capsule 1  . furosemide (LASIX) 20 MG tablet TAKE TWO TABLETS BY MOUTH ONCE DAILY. 60 tablet 3  . gabapentin (NEURONTIN) 100 MG capsule TAKE ONE CAPSULE BY MOUTH THREE TIMES DAILY 90 capsule 0  . glipiZIDE (GLUCOTROL) 10 MG tablet Take 2 tablets by mouth every morning    . glipiZIDE (GLUCOTROL) 10 MG tablet TAKE TWO TABLETS BY MOUTH TWICE DAILY 120 tablet 0  . glucose blood test strip Use as  instructed 100 each 0  . magnesium oxide (MAG-OX) 400 (241.3 MG) MG tablet TAKE ONE TABLET BY MOUTH TWICE DAILY 60 tablet 6  . metFORMIN (GLUCOPHAGE) 500 MG tablet TAKE TWO TABLETS BY MOUTH once  DAILY WITH MEALS    . nitroGLYCERIN (NITROSTAT) 0.4 MG SL tablet Place 1 tablet (0.4 mg total) under the tongue every 5 (five) minutes as needed for chest pain (up to 3 doses). 25 tablet 3  . Rivaroxaban (XARELTO) 15 MG TABS tablet Take 1 tablet (15 mg total) by mouth 2 (two) times daily with a meal. 70 tablet 0  . traMADol (ULTRAM) 50 MG tablet Take 1/2-1 tablet by mouth three times a day as needed for pain 90 tablet 0  . vitamin B-12 (CYANOCOBALAMIN) 1000 MCG tablet Take 1,000 mcg by mouth every morning.      No current facility-administered medications for this visit.     Past Medical History  Diagnosis Date  . HTN (hypertension)   . Pure hypercholesterolemia   . Type II or unspecified type diabetes mellitus without mention of complication, not stated as uncontrolled   . Anxiety state, unspecified   . Diaphragmatic hernia without mention of obstruction or gangrene   . Benign neoplasm of colon   . Mixed incontinence urge and stress (female)(female)   . Osteoarthrosis, unspecified whether generalized or localized, unspecified site   . Lumbago   . Osteoporosis, unspecified   . Memory loss   .  Renal infarction ([place])     01/2011 in setting of low INR  . History of stroke   . AF (atrial fibrillation) ([place])     a. Chronic Xarelto  . CAD (coronary artery disease)     a. 05/2012 NSTEMI/Cath/PCI: LM nl, LAD min irregs, LCX min irregs, RI 40-50 ost, OM1 nl, RCA dom, 33m(4.0x12 Vision BMS), PD/PL nl, EF 55%, at least mod MR. b. NSTEMI 03/2013 - cath with widely patent coronaries, suspect demand ischemia due to RVR.  . Mitral regurgitation     a. mild by echo 06/2012.  .Marland KitchenChronic combined systolic and diastolic CHF (congestive heart failure) ([place]     a. 01/2011 Echo: EF 40-45%, diff HK, diast dysfxn, mild  to mod MR, mod to sev dil LA/RV/RA, mod to sev reduced RV fxn, mod TR, PASP 463mg.;  b.  Echo 06/15/12:  EF 55%, mild MR, severe BAE, mod TR, PASP 35  . Hx of cardiovascular stress test     a. Myoview in 2006 was negative for ischemia, EF 54%. (WMartin Majesticn to have CAD development 05/2012.)  . Epistaxis     a. s/p cauterization 11/2012.  . Marland Kitchenypomagnesemia     ROS:   All systems reviewed and negative except as noted in the HPI.   Past Surgical History  Procedure Laterality Date  . Cholecystectomy    . Rt.leg surgery  11/2010  . Cataract surgery/both eyes      01/2012 left eye---02/2012 right eye  . Left heart catheterization with coronary angiogram N/A 05/28/2012    Procedure: LEFT HEART CATHETERIZATION WITH CORONARY ANGIOGRAM;  Surgeon: MiSherren MochaMD;  [facility];  Service: Cardiovascular;  Laterality: N/A;  . Percutaneous coronary stent intervention (pci-s)  05/28/2012    Procedure: PERCUTANEOUS CORONARY STENT INTERVENTION (PCI-S);  Surgeon: MiSherren MochaMD;  [facility];  Service: Cardiovascular;;  . Left heart catheterization with coronary angiogram N/A 03/14/2013    Procedure: LEFT HEART CATHETERIZATION WITH CORONARY ANGIOGRAM;  Surgeon: HeSinclair GroomsMD;  [facility];  Service: Cardiovascular;  Laterality: N/A;     Family History  Problem Relation Age of Onset  . Stomach cancer Father   . Pneumonia Mother   . CAD Brother      Social History   Social History  . Marital Status: Widowed    Spouse Name: N/A  . Number of Children: 6  . Years of Education: N/A   Occupational History  .     Social History Main Topics  . Smoking status: Never Smoker   . Smokeless tobacco: Never Used  . Alcohol Use: No  . Drug Use: No  . Sexual Activity: No   Other Topics Concern  . Not on file   Social History Narrative   1 sister--in poor health   1 brother hx of kidney stones and heart problems     BP 130/80 mmHg  Pulse 88  Ht 5' 5" (1.651  m)  Wt 129 lb 3.2 oz (58.605 kg)  BMI 21.50 kg/m2  SpO2 96%  Physical Exam:  stable appearing elderly woman, NAD HEENT: Unremarkable Neck:  6 cm JVD, no thyromegally Back:  No CVA tenderness Lungs:  Clear with no wheezes HEART:  IRegular rate rhythm, no murmurs, no rubs, no clicks Abd:  soft, positive bowel sounds, no organomegally, no rebound, no guarding Ext:  2 plus pulses, no edema, no cyanosis, no clubbing Skin:  No rashes no nodules Neuro:  CN  II through XII intact, motor grossly intact  EKG -atrial fibrillation with a controlled VR    Assess/Plan:

## 2014-12-18 NOTE — Patient Instructions (Signed)

## 2014-12-18 NOTE — Assessment & Plan Note (Signed)
Her blood pressure is well controlled. No change in medications. 

## 2014-12-18 NOTE — Assessment & Plan Note (Signed)
Her ventricular rate is well controlled. She will continue her chronic anti-coagulation. Will follow.

## 2014-12-18 NOTE — Assessment & Plan Note (Signed)
She is s/p PCI several years ago. She is currently pain free.

## 2014-12-22 ENCOUNTER — Telehealth: Payer: Self-pay

## 2014-12-22 NOTE — Telephone Encounter (Signed)
Did you call patient and let her know?

## 2014-12-22 NOTE — Telephone Encounter (Signed)
Patient was denied assistance for Xarelto through [employer] program, because she didn't make her 3% [place]. I can try other programs for her.

## 2015-01-01 ENCOUNTER — Other Ambulatory Visit: Payer: Self-pay | Admitting: Internal Medicine

## 2015-01-20 ENCOUNTER — Other Ambulatory Visit: Payer: Self-pay | Admitting: Internal Medicine

## 2015-01-29 ENCOUNTER — Telehealth: Payer: Self-pay | Admitting: Pulmonary Disease

## 2015-01-29 MED ORDER — MIRABEGRON ER 50 MG PO TB24: ORAL_TABLET | ORAL | Status: DC

## 2015-01-29 NOTE — Telephone Encounter (Signed)
Spoke with pt. States that she is having issues with urinating a lot. She could not give me number of how many times a day she urinates. Stated, "[place], I don't know. I just know I pee a lot." Denies lower abdominal pain, odor or pain when urinating. Urine is not discolored or cloudy. She is currently taking 40mg  of Lasix a day. Would like SN's recommendations as what to do.  SN - please advise. Thanks.

## 2015-01-29 NOTE — Telephone Encounter (Signed)
Per SN: can call in Myrbetriq 50 mg start with 1/2 tab QD x 1 week then increase to 1 tab QD for overactive bladder.  #30    Called spoke with pt. She is aware of recs and RX sent in. Nothing further needed

## 2015-02-09 ENCOUNTER — Other Ambulatory Visit: Payer: Self-pay | Admitting: Pulmonary Disease

## 2015-02-20 ENCOUNTER — Telehealth: Payer: Self-pay

## 2015-02-20 NOTE — Telephone Encounter (Signed)
PA request received from pharmacy for Myrbetriq 50mg .  Covered alternatives are: Trospium, Oxybutynin Chloride XR, or Detrol.   If a PA is needed, form can be printed online at [email or web address]  SN please advise if you would like to initiate a PA for this medication, or if you would like to switch her to a covered alternative.  Thanks!

## 2015-02-25 ENCOUNTER — Other Ambulatory Visit: Payer: Self-pay | Admitting: Pulmonary Disease

## 2015-02-25 MED ORDER — OXYBUTYNIN CHLORIDE 5 MG PO TABS: 5.0000 mg | ORAL_TABLET | Freq: Two times a day (BID) | ORAL | Status: DC

## 2015-02-25 NOTE — Telephone Encounter (Signed)
Pt is aware of the medication change. Rx has been sent in. Nothing further was needed.

## 2015-02-25 NOTE — Telephone Encounter (Signed)
Per SN: Tell pt insurance/pharmacy will not cover Myrbetriq.  Pt needs to try Oxybutynin #60 take 5 mg po bid refill prn.

## 2015-03-19 ENCOUNTER — Emergency Department (HOSPITAL_COMMUNITY)
Admission: EM | Admit: 2015-03-19 | Discharge: 2015-03-20 | Disposition: A | Discharge status: Home / Self Care | Payer: Medicare Other | Attending: Emergency Medicine | Admitting: Emergency Medicine

## 2015-03-19 ENCOUNTER — Encounter (HOSPITAL_COMMUNITY): Payer: Self-pay | Admitting: Emergency Medicine

## 2015-03-19 DIAGNOSIS — K08409 Partial loss of teeth, unspecified cause, unspecified class: Secondary | ICD-10-CM | POA: Insufficient documentation

## 2015-03-19 DIAGNOSIS — M1990 Unspecified osteoarthritis, unspecified site: Secondary | ICD-10-CM | POA: Diagnosis not present

## 2015-03-19 DIAGNOSIS — E7800 Pure hypercholesterolemia, unspecified: Secondary | ICD-10-CM | POA: Diagnosis not present

## 2015-03-19 DIAGNOSIS — I5042 Chronic combined systolic (congestive) and diastolic (congestive) heart failure: Secondary | ICD-10-CM | POA: Insufficient documentation

## 2015-03-19 DIAGNOSIS — I2510 Atherosclerotic heart disease of native coronary artery without angina pectoris: Secondary | ICD-10-CM | POA: Insufficient documentation

## 2015-03-19 DIAGNOSIS — Z79899 Other long term (current) drug therapy: Secondary | ICD-10-CM | POA: Diagnosis not present

## 2015-03-19 DIAGNOSIS — Z7984 Long term (current) use of oral hypoglycemic drugs: Secondary | ICD-10-CM | POA: Diagnosis not present

## 2015-03-19 DIAGNOSIS — K91840 Postprocedural hemorrhage and hematoma of a digestive system organ or structure following a digestive system procedure: Secondary | ICD-10-CM | POA: Insufficient documentation

## 2015-03-19 DIAGNOSIS — F419 Anxiety disorder, unspecified: Secondary | ICD-10-CM | POA: Diagnosis not present

## 2015-03-19 DIAGNOSIS — I10 Essential (primary) hypertension: Secondary | ICD-10-CM | POA: Diagnosis not present

## 2015-03-19 DIAGNOSIS — E119 Type 2 diabetes mellitus without complications: Secondary | ICD-10-CM | POA: Insufficient documentation

## 2015-03-19 DIAGNOSIS — Y658 Other specified misadventures during surgical and medical care: Secondary | ICD-10-CM | POA: Diagnosis not present

## 2015-03-19 DIAGNOSIS — Z8673 Personal history of transient ischemic attack (TIA), and cerebral infarction without residual deficits: Secondary | ICD-10-CM | POA: Diagnosis not present

## 2015-03-19 DIAGNOSIS — S0993XA Unspecified injury of face, initial encounter: Secondary | ICD-10-CM

## 2015-03-19 DIAGNOSIS — Z86010 Personal history of colonic polyps: Secondary | ICD-10-CM | POA: Insufficient documentation

## 2015-03-19 NOTE — ED Notes (Signed)
Pt sts she had tooth pulled today and bleeding will not stop. Pt taking Xerelto- did not take it today.

## 2015-03-20 MED ORDER — GELATIN ABSORBABLE 12-7 MM EX MISC
1.0000 | Freq: Once | CUTANEOUS | Status: AC
  Administered 2015-03-20: 1 via TOPICAL
  Filled 2015-03-20: qty 1

## 2015-03-20 NOTE — ED Provider Notes (Signed)
Patient presented to the ER with bleeding from dental extraction. Patient had dental extraction performed earlier today. She does takes Xarelto, but had stopped for 24 hours prior to the procedure.  Face to face Exam: HEENT - PERRLA; left upper dental extraction site with slight oozing of blood Lungs - CTAB Heart - RRR, no M/R/G Abd - S/NT/ND Neuro - alert, oriented x3  Plan: Will pack area with Gelfoam and apply direct pressure  Orpah Greek, MD 03/20/15 [phone]

## 2015-03-20 NOTE — ED Notes (Signed)
Patient able to ambulate independently  

## 2015-03-20 NOTE — Discharge Instructions (Signed)
1. Follow up with dentist in the morning  2. Restart Xarelto  3. Sleep on left side  4. Apply additional packing or tea bag if wound site continues to ooze  5. Return to the ED immediately if you become dizzy, lightheaded, feel like you are going to pass out, are having chest pain.

## 2015-03-20 NOTE — ED Notes (Signed)
PA reassessing patient at this time

## 2015-03-20 NOTE — ED Notes (Signed)
Pt's family came out and states patient was dripping blood down her chin.  Replacement gauze given and family reassured.  PA made aware.  Will attempt larger piece of Gelfoam.

## 2015-03-20 NOTE — ED Notes (Signed)
PA at bedside applying Gelfoam at this time

## 2015-03-20 NOTE — ED Notes (Signed)
PA reassessing at this time

## 2015-03-20 NOTE — ED Provider Notes (Signed)
CSN: [id number]     Arrival date & time 03/19/15  1946 History   First MD Initiated Contact with Patient 03/19/15 2336     Chief Complaint  Patient presents with  . Dental Problem     HPI Comments: 82 year old female who presents with uncontrolled bleeding from a pulled tooth earlier today. Her tooth was pulled at about 3pm. She is on Xarelto for A.fib however she has not taken it for 2 days. She denies LOC, syncope, dizziness, chest pain. She is accompanied by 2 family members.  The history is provided by the patient.    Past Medical History  Diagnosis Date  . HTN (hypertension)   . Pure hypercholesterolemia   . Type II or unspecified type diabetes mellitus without mention of complication, not stated as uncontrolled   . Anxiety state, unspecified   . Diaphragmatic hernia without mention of obstruction or gangrene   . Benign neoplasm of colon   . Mixed incontinence urge and stress (female)(female)   . Osteoarthrosis, unspecified whether generalized or localized, unspecified site   . Lumbago   . Osteoporosis, unspecified   . Memory loss   . Renal infarction ([place])     01/2011 in setting of low INR  . History of stroke   . AF (atrial fibrillation) ([place])     a. Chronic Xarelto  . CAD (coronary artery disease)     a. 05/2012 NSTEMI/Cath/PCI: LM nl, LAD min irregs, LCX min irregs, RI 40-50 ost, OM1 nl, RCA dom, 42m(4.0x12 Vision BMS), PD/PL nl, EF 55%, at least mod MR. b. NSTEMI 03/2013 - cath with widely patent coronaries, suspect demand ischemia due to RVR.  . Mitral regurgitation     a. mild by echo 06/2012.  .Marland KitchenChronic combined systolic and diastolic CHF (congestive heart failure) ([place]     a. 01/2011 Echo: EF 40-45%, diff HK, diast dysfxn, mild to mod MR, mod to sev dil LA/RV/RA, mod to sev reduced RV fxn, mod TR, PASP 472mg.;  b.  Echo 06/15/12:  EF 55%, mild MR, severe BAE, mod TR, PASP 35  . Hx of cardiovascular stress test     a. Myoview in 2006 was negative for ischemia, EF 54%.  (WMartin Majesticn to have CAD development 05/2012.)  . Epistaxis     a. s/p cauterization 11/2012.  . Marland Kitchenypomagnesemia    Past Surgical History  Procedure Laterality Date  . Cholecystectomy    . Rt.leg surgery  11/2010  . Cataract surgery/both eyes      01/2012 left eye---02/2012 right eye  . Left heart catheterization with coronary angiogram N/A 05/28/2012    Procedure: LEFT HEART CATHETERIZATION WITH CORONARY ANGIOGRAM;  Surgeon: MiSherren MochaMD;  [facility];  Service: Cardiovascular;  Laterality: N/A;  . Percutaneous coronary stent intervention (pci-s)  05/28/2012    Procedure: PERCUTANEOUS CORONARY STENT INTERVENTION (PCI-S);  Surgeon: MiSherren MochaMD;  [facility];  Service: Cardiovascular;;  . Left heart catheterization with coronary angiogram N/A 03/14/2013    Procedure: LEFT HEART CATHETERIZATION WITH CORONARY ANGIOGRAM;  Surgeon: HeSinclair GroomsMD;  [facility];  Service: Cardiovascular;  Laterality: N/A;   Family History  Problem Relation Age of Onset  . Stomach cancer Father   . Pneumonia Mother   . CAD Brother    Social History  Substance Use Topics  . Smoking status: Never Smoker   . Smokeless tobacco: Never Used  . Alcohol Use: No   OB History  No data available     Review of Systems  Constitutional: Negative.   HENT: Negative for trouble swallowing.        Bleeding from dental extraction site  Cardiovascular: Negative for chest pain.  Neurological: Negative for dizziness, syncope, speech difficulty, weakness and light-headedness.      Allergies  Peanut-containing drug products; Azithromycin; Flomax; Pioglitazone; Pregabalin; and Sulfonamide derivatives  Home Medications   Prior to Admission medications   Medication Sig Start Date End Date Taking? Authorizing Provider  acetaminophen (TYLENOL) 500 MG tablet Take 500 mg by mouth every 6 (six) hours as needed for mild pain. 03/15/13   Dayna N Dunn, PA-C  atenolol (TENORMIN) 50 MG  tablet TAKE ONE TABLET BY MOUTH ONCE DAILY 01/01/15   Evans Lance, MD  atorvastatin (LIPITOR) 40 MG tablet TAKE ONE TABLET BY MOUTH ONCE DAILY AT [facility] [place] 05/20/13   Evans Lance, MD  Blood Glucose Monitoring Suppl (ACCU-CHEK AVIVA PLUS) W/DEVICE KIT Test blood sugar once daily 10/24/13   Noralee Space, MD  diltiazem (CARTIA XT) 120 MG 24 hr capsule Take 1 capsule (120 mg total) by mouth daily. 01/20/15   Evans Lance, MD  furosemide (LASIX) 20 MG tablet TAKE TWO TABLETS BY MOUTH ONCE DAILY. 11/18/14   Evans Lance, MD  gabapentin (NEURONTIN) 100 MG capsule TAKE ONE CAPSULE BY MOUTH THREE TIMES DAILY 02/17/14   Noralee Space, MD  gabapentin (NEURONTIN) 100 MG capsule TAKE ONE CAPSULE BY MOUTH THREE TIMES DAILY 02/10/15   Noralee Space, MD  glipiZIDE (GLUCOTROL) 10 MG tablet Take 2 tablets by mouth every morning 10/22/12   Noralee Space, MD  glipiZIDE (GLUCOTROL) 10 MG tablet TAKE TWO TABLETS BY MOUTH TWICE DAILY 02/26/15   Noralee Space, MD  glucose blood test strip Use as instructed 10/16/13   Noralee Space, MD  magnesium oxide (MAG-OX) 400 (241.3 MG) MG tablet TAKE ONE TABLET BY MOUTH TWICE DAILY 07/04/14   Evans Lance, MD  metFORMIN (GLUCOPHAGE) 500 MG tablet TAKE TWO TABLETS BY MOUTH once  DAILY WITH MEALS 10/14/13   Noralee Space, MD  nitroGLYCERIN (NITROSTAT) 0.4 MG SL tablet Place 1 tablet (0.4 mg total) under the tongue every 5 (five) minutes as needed for chest pain (up to 3 doses). 03/15/13   Dayna N Dunn, PA-C  oxybutynin (DITROPAN) 5 MG tablet Take 1 tablet (5 mg total) by mouth 2 (two) times daily. 02/25/15   Noralee Space, MD  Rivaroxaban (XARELTO) 15 MG TABS tablet Take 1 tablet (15 mg total) by mouth daily with supper. 12/18/14   Evans Lance, MD  traMADol Veatrice Bourbon) 50 MG tablet Take 1/2-1 tablet by mouth three times a day as needed for pain 10/24/14   Noralee Space, MD  vitamin B-12 (CYANOCOBALAMIN) 1000 MCG tablet Take 1,000 mcg by mouth every morning.     Historical Provider, MD   BP  127/78 mmHg  Pulse 78  Resp 20  SpO2 100% Physical Exam  Constitutional: She is oriented to person, place, and time. She appears well-developed and well-nourished. No distress.  HENT:  Head: Normocephalic and atraumatic.  Blood oozing from left upper dental extraction site  Eyes: Pupils are equal, round, and reactive to light.  Pulmonary/Chest: Effort normal.  Neurological: She is alert and oriented to person, place, and time.  Skin: Skin is warm and dry.    ED Course  Procedures (including critical care time)  Placed Gelfoam and gauze and had  patient bite down for 20 minutes. Pt was still bleeding so repositioned gelfoam so it was inserted in to extraction site. After re-evaluation blood was still oozing however it was not as brisk. Placed a tea bag in site and had patient bite down for another 20 minutes. After re-evaluation bleeding has stopped.  MDM   Final diagnoses:  Dental injury, initial encounter   Pt advised to call her dentist in the morning for re-evaluation and restart her Xarelto. Advised to sleep on her L side tonight in case bleeding restarts. Given gauze and a tea bag and told to reapply pressure. Pt is hemodynamically stable and denies LOC, syncope, feeling lightheaded or dizzy so safe for discharge however if these symptoms do develop to return to the ED. Patient and family verbalize understanding and agree with plan.  Recardo Evangelist, PA-C 03/20/15 1200  Orpah Greek, MD 03/26/15 [phone]

## 2015-03-23 ENCOUNTER — Telehealth: Payer: Self-pay

## 2015-03-23 NOTE — Telephone Encounter (Signed)
Pt called an asked for samples of xarelto 15 mg. I left her some samples up front

## 2015-03-25 ENCOUNTER — Other Ambulatory Visit: Payer: Self-pay | Admitting: Pulmonary Disease

## 2015-04-23 ENCOUNTER — Encounter: Payer: Self-pay | Admitting: Pulmonary Disease

## 2015-04-23 ENCOUNTER — Other Ambulatory Visit (INDEPENDENT_AMBULATORY_CARE_PROVIDER_SITE_OTHER): Payer: Medicare Other

## 2015-04-23 ENCOUNTER — Ambulatory Visit (INDEPENDENT_AMBULATORY_CARE_PROVIDER_SITE_OTHER): Payer: Medicare Other | Admitting: Pulmonary Disease

## 2015-04-23 VITALS — BP 138/86 | HR 82 | Temp 97.1°F | Resp 16 | Ht 65.0 in | Wt 128.6 lb

## 2015-04-23 DIAGNOSIS — N3946 Mixed incontinence: Secondary | ICD-10-CM

## 2015-04-23 DIAGNOSIS — M810 Age-related osteoporosis without current pathological fracture: Secondary | ICD-10-CM

## 2015-04-23 DIAGNOSIS — I10 Essential (primary) hypertension: Secondary | ICD-10-CM

## 2015-04-23 DIAGNOSIS — E7800 Pure hypercholesterolemia, unspecified: Secondary | ICD-10-CM

## 2015-04-23 DIAGNOSIS — F411 Generalized anxiety disorder: Secondary | ICD-10-CM

## 2015-04-23 DIAGNOSIS — K449 Diaphragmatic hernia without obstruction or gangrene: Secondary | ICD-10-CM | POA: Diagnosis not present

## 2015-04-23 DIAGNOSIS — E1140 Type 2 diabetes mellitus with diabetic neuropathy, unspecified: Secondary | ICD-10-CM

## 2015-04-23 DIAGNOSIS — E1165 Type 2 diabetes mellitus with hyperglycemia: Secondary | ICD-10-CM

## 2015-04-23 DIAGNOSIS — I2510 Atherosclerotic heart disease of native coronary artery without angina pectoris: Secondary | ICD-10-CM | POA: Diagnosis not present

## 2015-04-23 DIAGNOSIS — I5042 Chronic combined systolic (congestive) and diastolic (congestive) heart failure: Secondary | ICD-10-CM | POA: Diagnosis not present

## 2015-04-23 DIAGNOSIS — I4821 Permanent atrial fibrillation: Secondary | ICD-10-CM

## 2015-04-23 DIAGNOSIS — I482 Chronic atrial fibrillation: Secondary | ICD-10-CM | POA: Diagnosis not present

## 2015-04-23 DIAGNOSIS — M150 Primary generalized (osteo)arthritis: Secondary | ICD-10-CM

## 2015-04-23 DIAGNOSIS — M159 Polyosteoarthritis, unspecified: Secondary | ICD-10-CM

## 2015-04-23 DIAGNOSIS — IMO0002 Reserved for concepts with insufficient information to code with codable children: Secondary | ICD-10-CM

## 2015-04-23 LAB — LIPID PANEL
Cholesterol: 216 mg/dL — ABNORMAL HIGH (ref 0–200)
HDL: 79.7 mg/dL (ref >39.00)
LDL Cholesterol: 113 mg/dL — ABNORMAL HIGH (ref 0–99)
NONHDL: 136.07
Total CHOL/HDL Ratio: 3
Triglycerides: 115 mg/dL (ref 0.0–149.0)
VLDL: 23 mg/dL (ref 0.0–40.0)

## 2015-04-23 LAB — CBC WITH DIFFERENTIAL/PLATELET
BASOS ABS: 0 10*3/uL (ref 0.0–0.1)
Basophils Relative: 0.5 % (ref 0.0–3.0)
EOS ABS: 0.1 10*3/uL (ref 0.0–0.7)
Eosinophils Relative: 1.8 % (ref 0.0–5.0)
HEMATOCRIT: 39.9 % (ref 36.0–46.0)
HEMOGLOBIN: 13.5 g/dL (ref 12.0–15.0)
LYMPHS PCT: 15.7 % (ref 12.0–46.0)
Lymphs Abs: 0.9 10*3/uL (ref 0.7–4.0)
MCHC: 33.9 g/dL (ref 30.0–36.0)
MCV: 95 fl (ref 78.0–100.0)
MONOS PCT: 9.4 % (ref 3.0–12.0)
Monocytes Absolute: 0.5 10*3/uL (ref 0.1–1.0)
NEUTROS ABS: 4 10*3/uL (ref 1.4–7.7)
Neutrophils Relative %: 72.6 % (ref 43.0–77.0)
Platelets: 228 10*3/uL (ref 150.0–400.0)
RBC: 4.2 Mil/uL (ref 3.87–5.11)
RDW: 14.4 % (ref 11.5–15.5)
WBC: 5.5 10*3/uL (ref 4.0–10.5)

## 2015-04-23 LAB — COMPREHENSIVE METABOLIC PANEL
ALK PHOS: 79 U/L (ref 39–117)
ALT: 9 U/L (ref 0–35)
AST: 15 U/L (ref 0–37)
Albumin: 4.3 g/dL (ref 3.5–5.2)
BILIRUBIN TOTAL: 1 mg/dL (ref 0.2–1.2)
BUN: 19 mg/dL (ref 6–23)
CO2: 33 mEq/L — ABNORMAL HIGH (ref 19–32)
CREATININE: 0.94 mg/dL (ref 0.40–1.20)
Calcium: 9.8 mg/dL (ref 8.4–10.5)
Chloride: 97 mEq/L (ref 96–112)
GFR: 60.5 mL/min (ref >60.00)
GLUCOSE: 268 mg/dL — AB (ref 70–99)
Potassium: 4.6 mEq/L (ref 3.5–5.1)
Sodium: 135 mEq/L (ref 135–145)
TOTAL PROTEIN: 7.2 g/dL (ref 6.0–8.3)

## 2015-04-23 LAB — HEMOGLOBIN A1C: Hgb A1c MFr Bld: 10.6 % — ABNORMAL HIGH (ref 4.6–6.5)

## 2015-04-23 LAB — TSH: TSH: 3.51 u[IU]/mL (ref 0.35–4.50)

## 2015-04-23 NOTE — Patient Instructions (Signed)
Today we updated your med list in our EPIC system...    Continue your current medications the same...  Today we checked your yearly blood work...    We will contact you w/ the results when available...   Continue to be as active as possible... Continue to take your regular medications every day...  Call for any questions...  Let's plan a follow up visit in 41mo, sooner if needed for problems.Marland KitchenMarland Kitchen

## 2015-04-24 ENCOUNTER — Encounter: Payer: Self-pay | Admitting: Pulmonary Disease

## 2015-04-24 ENCOUNTER — Ambulatory Visit: Payer: Medicare Other | Admitting: Pulmonary Disease

## 2015-04-24 NOTE — Progress Notes (Signed)
Subjective:    Patient ID: Michelle Herrera, female    DOB: 02-Apr-1930, 82 y.o.   MRN: [id number]  HPI 82 y/o WF here for a follow up visit...  SEE PREV EPIC NOTES FOR OLDER DATA >>     Adm 1/13 w/ renal infarcts felt due to micro-emboli & inadeq INR ==> changed to [place].  Hosp 5/14 for NSTEMI=> stent to RCA.  LABS 10/14:  FLP- looks good on Lip40;  Chems- ok x BS=181, A1c=8.8.Marland KitchenMarland Kitchen  Hosp 3/15 w/ NSTEMI & cath showed widely patent coronaries & EF=50%.  LABS 4/15:  Chems- ok x BS=181, A1c=9.3;  LFTs- wnl;  CBC- wnl;  Fe=121 (31%sat);    ADDENDUM>> she notes that she has no appetite & "not eating"; she has lost 21# in 67modown to 109# & BMI=18; but serum proteins WNL; we will treat w/ MEGACE '200mg'$  before meals Tid...  ~  October 23, 2013:  649moOV & Michelle Herrera reports that she is feeling well, no new complaints or concerns;  Over the 57m51moterval however she has decreased both of her DM meds in half as she says she doesn't tolerate the max doses due to "feeling bad, GI upset, & diarrhea";  She has also seen DrWhitfield for back & side pain- she reports he did XRays but doesn't know what it is & we don't have his notes to review; she gets relief w/ Tramadol but she states '50mg'$  tab gives her diarrhea & she is rec to take 1/2 tab w/ an ESTylenol... We reviewed the following medical problems during today's office visit >>     HBP> on Aten50Bid, Diltiazem120,  & Lasix20-2Qam; BP= 110/64 & she denies CP/ SOB/ edema, notes occas palpit...    CAD, Cardiomyopathy (sys & diast CHF), Valvular Dis w/ MR> off ASA81 due to epistaxis, on Xarelto15 (lowered dose due to nose bleed) & above meds; Adm 5/14 by Cards w/ NSTEMI, Cath=> stent to 95%RCA; followed by DrTaylor; Adm 1/15 & 3/15 w/ AFib, RVR, NSTEMI due to demand ischemia and recath w/ widely patent coronaries, EF=50%; meds adjusted...    AFib, CHB & Pacer> on Xarelto15 due to epistaxis; followed by DrTaylor & seen 8/15> cardiology notes are reviewed...  Chol> on Lip40; weight is up to 116#, BMI=19; FLP 10/15 shows TChol 127, TG 54, HDL 69, LDL 47    DM w/ neuropathy> prev on Metform500-2Bid & Glipiz10-2Bid but INTOL she says & decreased to 2 of each Qam only; prev followed by DrEllison but last seen 6/13- Actos stopped due to edema; won't take additional meds due to cost; refuses insulin shots; she claims that BS higher when her knee is hurting; Labs 10/15 showed BS=172, A1c=8.4 (sl improved) & asked to ADD Metform500 & Glipiz10 (one of each) in PM at dinner...     GI- HH, colon polyps, s/p GB> stable she says, not on regular PPI dosing, last colon was 2003 by DrStark w/ 5mm20mlyp & divertics; denies abd pain, n/v, d/c, blood seen...    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     DJD, LBP, right shoulder pain> on Neurontin100Tid; Ortho evals from DrNorris/ [facility]- prev knee surg for torn cartilage, back discomfort treated w/ Tramadol & Tylenol...    Osteoporosis, Vit D defic> Vit D level 2013 = 30 and she  was asked to incr her OTC Vit D supplement to 2000u daily; needs f/u BMD but she wants to wait...    Memory Loss> Hx 2 sm infarcts on prev MRI, on Xarelto from Cards; she declines Aricept etc...    Anxiety> mod stress in the family, she declines anxiolytic meds...    B12 defic> on OTC oral B12 supplement 1000u daily... We reviewed prob list, meds, xrays and labs> see below for updates >> Given 2015 Flu vaccine today; she did not bring meds or med list to office visit today!  LABS 10/15:  FLP- looks great on Lip40;  Chems- BS=172, A1c=8.4 on her Metform500-2Qam & glipiz10-2Qam... PLAN>>  Rec to increase her DM meds to include one of each at dinner time in PM, plus diet, exercise, etc (NOTE- she did NOT increase the meds as  directed)...   ~  April 24, 2014:  38moRLuis Llorens Torresstates she's doing well- CC is hip pain & she notes that Tylenol helps;     HBP, CAD, cardiomyop (sys & diast CHF), MR> on Aten50, Cardizem120, Lasix20-2/d; BP= 110/70 & she reports doing well- no CP etc...    PAF, CHB, pacer> followed by DrTaylor on Xarelto15 (decreased due to epistaxis); she reports stable- no CP, palpit, dizzy, nose bleeds etc...    Chol> on Lip40; FLP 4/16 showed TChol 214, TG 106, HDL 86, LDL 107... We reviewed diet, continue same med.    DM> on Metform500-2Qam & Glucotrol10-2Qam; compliance is ?? She has been reluctant to follow my recs for increased meds; Labs 4/16 showed BS=175, A1c=9.4 => refer to [facility] for DM management.    Other problems as listed> on Neurontin100Tid, MVI, B12-1000/d... We reviewed prob list, meds, xrays and labs> see below for updates >>   CXR 4/16 showed mod cardiomeg, hyperinflation, sl blunt angles c/w pleural thickening, DJD Tspine, osteopenia, NAD...  LABS 4/16:  FLP- note quite at goals;  Chems- ok x BS=175, A1c=9.4;  CBC- wnl;  TSH=4.61;  BNP=438...  ADDENDUM>> she REFUSED referral to Endocrinology for DM mamagement, refuses to increase her DM meds per my recommendations, refuses to start insulin therapy; she says that she will continue current meds and get on diet + exercise to get her BS improved & A1c better...  ~  October 24, 2014:  Michelle Herrera a quiet interval, notes some LBP & this is helped by Tramadol 1/2 tab + Tylenol...    HBP, CAD, cardiomyop (sys & diast CHF), MR> on Aten50, Cardizem120, Lasix20-2/d; BP= 138/64 & she reports doing well- no CP etc; she needs incr exercise...    PAF, CHB, pacer> followed by DrTaylor on Xarelto15/d (decreased due to epistaxis); she reports stable- no CP, palpit, dizzy, nose bleeds etc...    Chol> on Lip40; FLP 4/16 showed TChol 214, TG 106, HDL 86, LDL 107... We reviewed diet, continue same med.    DM> on Metform500-2Qam & Glucotrol10-2Qam;  compliance is ?? She has been reluctant to follow my recs for increased meds; Labs 4/16 showed BS=175, A1c=9.4 => refer to [facility] for DM management (she refused again).    Other problems as listed> on Neurontin100Tid, MVI, B12-1000/d... We reviewed prob list, meds, xrays and labs>  IMP/PLANS>>  Reminded to take meds regularly & again asked to bring all med bottles to every office visit; she continues to refuse to increase her DM meds or go to an endocrinologist for DM management; Her CC is the back pain but Tramadol50 makes her too groggy- rec  to take 1/2 tab + Tylenol, up to Tid as needed...   ~  April 23, 2015:  68moRBateslandhad some dental work w/ a tooth pulled 03/19/15- developed some bleeding that wouldn't stop & went to ER (she is on Xarelto- held that one day); treated w/ gauze & a tea-bag w/ control...     She had f/u Cards- DrTaylor 12/18/14> note reviewed- chr AFib, HBP, CAD s/p stent to RCA 2014, combined sys & diast CHF, valv dis w/ mod MR/ severe TR/ and mod PulmHTN (553mg); no CP, chrDOE (class2); Same meds- on Xarelto, Aten50, Cardizem120, Lasix40, & no changes...     HBP> on Aten50, Diltiazem120 & Lasix20-2Qam; BP= 138/86 & she denies CP/ ch in SOB/ edema, notes occas palpit...    CAD, Cardiomyopathy (sys & diast CHF), Valvular Dis w/ MR etc> off ASA81 due to epistaxis, on Xarelto15 (lowered dose due to nose bleed) & above meds; Adm 5/14 by Cards w/ NSTEMI, Cath=> stent to 95%RCA; followed by DrTaylor; Adm 1/15 & 3/15 w/ AFib, RVR, NSTEMI due to demand ischemia and recath w/ widely patent coronaries, EF=50%; meds adjusted...    AFib, CHB & Pacer> on Xarelto at 15 (due to epistaxis); followed by DrTaylor & seen 12/16> cardiology notes are reviewed...    Chol> on Lip40; weight is up to 128#, BMI=21; FLP 4/16 shows TChol 214, TG 106, HDL 86, LDL 107 & reminded to take med every day!    DM w/ neuropathy> on Metform500Bid & Glipiz10-2Bid but ?what she is taking (didn't brink bottles);  prev followed by DrEllison- Actos stopped due to edema; won't take additional meds due to cost; refuses insulin shots; she claims that BS higher when her knee is hurting; Labs 4/16 showed BS=175, A1c=9.4;  Labs 4/17 showed BS=268, A1c=10.6;  I confirmed w/ [facility] ([name]) that pt is NOT filling meds regularly (Metform= 04/04/15 for 30d supply & 01/20/15 prior to that;  Glipizide= 02/26/15 for 30d supply & 12/11/14 prior to that);  We will again try to refer to Endocrine (she has refused mult times).    GI- HH, colon polyps, s/p GB> stable she says, not on regular PPI dosing, last colon was 2003 by DrStark w/ 65m40molyp & divertics; denies abd pain, n/v, d/c, blood seen...    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     DJD, LBP, right shoulder pain> on Neurontin100Tid; Ortho evals from DrNorris/ Whitfield- prev knee surg for torn cartilage, back discomfort treated w/ Tramadol & Tylenol...    Osteoporosis, Vit D defic> Vit D level 2013 = 30 and she was asked to incr her OTC Vit D supplement to 2000u daily; needs f/u BMD but she wants to wait...    Memory Loss> Hx 2 sm infarcts on prev MRI, on Xarelto from Cards; she declines Aricept etc...    Anxiety> mod stress in the family, she declines anxiolytic meds...    B12 defic> on OTC oral B12 supplement 1000u daily...    Medication non-compliance>  This is well documented at each & every office visit... EXAM shows Afeb, VSS, O2sat=98% on RA;  HEENT- dental problems, mallampati1;  Chest- clear w/o w/r/r;  Heart- Irreg Afib, gr1-2/6 DEM w/o r/g heard;  Abd- soft, nontender, neg;  Ext- VI, w/o c/c/e;  Neuro- intact w/o focal  neuro deficits...  LABS 04/23/15>  FLP- not at goals, not taking meds regularly;  Chems- ok x BS=268, A1c=10.6;   CBC- wnl;  Thyroid=3.51;  Reminded to take all meds every day! IMP/PLAN>>  Michelle Herrera is still NOT taking her meds regularly- asked to fill all Rx every month & take all meds as prescribed every day!  We will attempt to get her to see DrGherghe at [facility]...            Problem List:     HYPERTENSION >> VALVULAR HEART DISEASE >> ATRIAL FIBRILLATION - stable on Rx w/ XARELTO & rate control strategy. ~  4/11:  she reports that she stopped her Lanoxin 0.125 on her own & doesn't want to restart. ~  8/11:  f/u DrTaylor & stable on Coumadin w/ rate control strategy, no changes made. ~  11/11:  Hosp by Neuro w/ TIA> 2DEcho showed mild LVH, diffuse HK w/ EF= 45%, thick pos MV leaflet w/ restrict motion & modMR, dilated LA&RA, severeTR & PAsys=49, can't r/o PFO... ~  8/12:  her BP=116/84 and she feels OK- denies HA, fatigue, visual changes, CP, palipit, dizziness, syncope, dyspnea, edema, etc... ~  12/12:  BP= 126/88 and she remains asymptomatic as above... ~  1/13:  2DEcho showed mild LVH w/ EF= 40-45% & diffuse HK, Diastolic dysfunction, modMR, severe LAE, modTR, Pasys=45 ~  She was switched from Coumadin to [facility] 1/13 due to the renal infarcts & difficulty maintaining INR. ~  CXR 1/13 showed marked cardiomegaly, pulm vasc congestion, NAD... ~  4/13:  BP= 128/60 and she is feeling better she says;  BUN=15, Creat=0.8; She had f/u DrTaylor> noted to be doing well w/o symptoms- continue same meds & low Na. ~  EKG 4/13 showed AFib, rate 77, right ward axis & NSSTTWA... ~  8/13:  BP= 110/82 and she denies current CP, palpit, SOB, edema... ~  4/14: on Aten50Bid; BP= 130/84 & she denies CP/ SOB/ edema, notes occas palpit. ~  5/14:  Adm by Cards> NSTEMI, Cath w/ LAD minor irreg only, LCirc w/ ostial 40-50%, mid RCA 95%, EF 55%. PCI: Vision BMS to the mid RCA. There was moderate MR on left ventriculogram;  ~  CXR 5/14 showed cardiomeg, underlying COPD w/ apical scarring, DJD in spine, NAD... ~  EKG 6/14  showed AFib, rate72, NSSTTWA...  ~  2DEcho 6/14 showed norm LV size & funct w/ EF=55%, mild MR, severe RA&LA dil, modTR,  ~  Adm 1/15 & 3/15 w/ CP, AFib w/ RVR, had NSTEMI 3/15 from demand ischemia; cath w/ patent coronaries, EF=50%; meds adjusted w/ stop ASA & decr Xarelto to 36m due to severe epistaxis... ~  CXR 3/15 showed cardiomegaly, hyperinflated lungs, biapical pleural thickening, NAD... ~  EKG 3/15 showed AFib, rate74, NSSTTWA... ~  4/15: on Aten50Bid, Diltiazem120, & Lasix20; also taking Xarelto15; BP= 110/64 & she denies CP/ SOB/ edema, notes occas palpit... ~  10/15:  on Aten50Bid, Diltiazem120,  & Lasix20-2Qam, Xarelto15; BP= 110/64 & she claims asymptomatic... ~  4/16: on Aten50, Cardizem120, Lasix20-2/d, Xarelto15; she reports doing well- no CP etc; BP= 110/70... ~  CXR 4/16 showed mod cardiomeg, hyperinflation, sl blunt angles c/w pleural thickening, DJD Tspine, osteopenia, NAD.  HYPERCHOLESTEROLEMIA - on diet + SIMVASTATIN 411md... ~  FLWinton007 showed TChol 160, TG 143, HDL 55, LDL 76 ~  FLP 7/09 showed TChol 165, TG 129, HDL 60, LDL 80 ~  FLP 10/10 showed TChol 175, TG 68, HDL 80, LDL 82 ~  FLP 4/11 showed TChol 207, TG 104, HDL 84, LDL 111... rec better diet, continue same med. ~  FLP 11/11 on Simva40 showed TChol 180, TG 71, HDL 67, LDL 99 ~  FLP 8/12 on Simva40 showed TChol 196, TG 96, HDL 89, LDL 88 ~  FLP 4/13 on Simva40 showed TChol 211, TG 77, HDL 85, LDL 112... rec better diet, same med. ~  FLP 10/14 on Lip40 showed TChol 138, TG 63, HDL 65, LDL 61  ~  FLP 10/15 on Lip40 showed  TChol 127, TG 54, HDL 69, LDL 47 ~  FLP 4/16 on Lip40 showed TChol 214, TG 106, HDL 86, LDL 107   TYPE 2 DIABETES MELLITUS, Uncontrolled, w/ Neuropathy >>  ~  on METFORMIN 521m- 2tabsBid, GLIPZIDE 169mid (not taking Januvia due to $$)... ~  labs 1/09 showed BS= 148, HgA1c= 7.1...Marland KitchenMarland Kitchenontinue meds + better diet... ~  labs 7/09 showed BS= 168, HgA1c= 7.6...Marland KitchenMarland Kitchenncr Metform 2Bid, contin Glip 10Bid  & Januv... ~  she stopped the Januvia due to cost... ~  labs 4/10 showed BS= 185, Aic= 8.0...Marland KitchenMarland Kitcheneminder to take meds regularly. ~  labs 10/10 on Metform2Bid+Glip10Bid showed BS= 124, A1c= 6.7 ~  labs 4/11 showed BS= 162, A1c= 6.8...Marland Kitchenrec> better diet, take meds regularly. ~  labs 10/11 showed BS= 322, A1c= 8.3...Marland KitchenMarland Kitchenharm confirms not taking meds regularly!!! Discussed w/ pt... ~  Labs 4/12 showed BS= 91, A1c= 7.9...Marland KitchenMarland Kitchenec> keep same meds, take regularly, better low carb diet... ~  Labs 8/12 on Metform2Bid+Glip10Bid showed BS= 114, A1c= 7.3...Marland KitchenMarland Kitchenontinue same... ~  Labs in hosp 1/13 showed BS~ 153-209 & A1c=7.4... NOTE: she has refused other meds due to $$$ ~  Labs 4/13 on Metform2Bid+Glipiz10Bid showed BS= 208, A1c= 8.6... She is referred to Endocrinology ~  She saw DrEllison but refused Actos, refused Gliptins, etc... ~  Labs 8/13 on Metform2Bid+Glipiz10Bid showed BS= 163, A1c= 7.5...Marland KitchenMarland Kitchenontinue same + better diet... ~  Labs 10/14 on Metform2Bid+Glipiz10Bid showed BS= 181, A1c= 8.8...Marland KitchenMarland Kitchenec better diet & incr Glipiz10-2Bid... ~  Labs 4/15 on Metform500-2Bid & Glipizide10-2Bid showed BS= 181, A1c= 9.3; she refuses additional meds; she cut back both meds to just Qam due to "side effects"... ~  Labs 10/15 on Metform500-2Qam & Glipiz10-2Qam showed BS= 172, A1c= 8.4 and REC to add one of each at dinner in the PM...  ~  Labs 4/16 on Metform500-2Qam & Glipiz10-2Qam showed BS=175, A1c=9.4 (compliance is ?? She has been reluctant to follow my recs for increased meds) => refer to [facility] for DM management.  HIATAL HERNIA (ICD-553.3) - last EGD by DrStark was 3/03 and showed a HH, reflux...  COLONIC POLYPS (ICD-211.3) - last colonoscopy 3/03 by DrStark showed divertics and 77m50molyp (no path avail).  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 pt reports that there is nothing they can do to help... ~  she has had  several UTI's... then perineal rash & saw GYN= neg PAP & Rx yeast infection- resolved. ~  She will f/u w/ Urology for persistant symptoms, seen by DrEskridge w/ trial Myrbetriq but not much benefit per pt... ~  7/15: referred to DrEskridge by Ortho after MRI spine showed ?bilat hydro; reviewed by Urology & they disagreed- no hydro but she has hx renal infarct 1/13, bilat extrarenal pelvis, & left peri-pelvic cysts, small bladder capacity, known cystocele...  DEGENERATIVE JOINT DISEASE (ICD-715.90) - she needs right TKR but wants to put  this off as long as poss... ~  4/12:  Ortho eval by DrNorris, hx prev knee arthroscopies, given Cortisone shot, & he plans ?Synvisc series... ~  9/12:  outpt knee surg to repair torn cartilage per DrNorris; preop clearance by DrTaylor & [facility]...  BACK PAIN, LUMBAR (ICD-724.2) - XRays showed scoliosis, facet degen arthritis, & osteopenia...  she has used ROBAXIN Prn... ~  4/12:  She reports eval from DrNorris w/ "arthritis all down my backbone" & he rec epid steroid injection, but she reports improved on NEURONTIN 139m Tid... ~  4/13:  She reports that back pain has improved but she still has some leg pain but notes that "it's tolerable"...  SHOULDER PAIN, RIGHT (ICD-719.41) - prev eval & Rx by DrRendall... s/p right shoulder surg 3/09... XRays showed calcium deposit, rotator cuff prob, & intol to Tramadol... ~  She tells me she wants to f/u w/ DrWhitfield for Ortho...  OSTEOPOROSIS (ICD-733.00) ~  labs 10/10 w/ Vit D level = 32... rec> start Vit D OTC 1000 u daily. ~  Labs 4/13 showed Vit D level = 30... rec to incr to 2000u daily...  MEMORY LOSS (ICD-780.93) - concern for her memory expressed 7/09 OV- tried Aricept but she stopped it- "no different". TIA/ INFARCT - she was Hosp 11/11 by DrWillis w/ left sided weakness, ?left facial droop, & MRI showed 2 sm infarcts on right (frontal & ?pontine);  MRA was neg;  CDopplers were neg;  Deficits all cleared  rapidly;  2DEcho was abn- see above;  She was continued on her Coumadin Rx- no changes made... ~  4/13:  She was offered memory medications but she declined...  ANXIETY (ICD-300.00) - stress in family... 2 y/o granddau w/ seizures...  VITAMIN B12 DEFICIENCY >> on OTC Vit B-12 tabs orally> 10057m/d... ~  Labs 7/11 showed Vit V12 level = 135... Started on Vit B12 supplement w/ 10001md... ~  Labs 4/13 showed Vit B12 level = 905... Ok top decr to 1/2 tab daily...   Past Surgical History  Procedure Laterality Date  . Cholecystectomy    . Rt.leg surgery  11/2010  . Cataract surgery/both eyes      01/2012 left eye---02/2012 right eye  . Left heart catheterization with coronary angiogram N/A 05/28/2012    Procedure: LEFT HEART CATHETERIZATION WITH CORONARY ANGIOGRAM;  Surgeon: MicSherren MochaD;  [facility];  Service: Cardiovascular;  Laterality: N/A;  . Percutaneous coronary stent intervention (pci-s)  05/28/2012    Procedure: PERCUTANEOUS CORONARY STENT INTERVENTION (PCI-S);  Surgeon: MicSherren MochaD;  [facility];  Service: Cardiovascular;;  . Left heart catheterization with coronary angiogram N/A 03/14/2013    Procedure: LEFT HEART CATHETERIZATION WITH CORONARY ANGIOGRAM;  Surgeon: HenSinclair GroomsD;  [facility];  Service: Cardiovascular;  Laterality: N/A;    Outpatient Encounter Prescriptions as of 04/23/2015  Medication Sig  . acetaminophen (TYLENOL) 500 MG tablet Take 500 mg by mouth every 6 (six) hours as needed for mild pain.  . aMarland Kitchenenolol (TENORMIN) 50 MG tablet TAKE ONE TABLET BY MOUTH ONCE DAILY  . atorvastatin (LIPITOR) 40 MG tablet TAKE ONE TABLET BY MOUTH ONCE DAILY AT 6PM  . Blood Glucose Monitoring Suppl (ACCU-CHEK AVIVA PLUS) W/DEVICE KIT Test blood sugar once daily  . diltiazem (CARTIA XT) 120 MG 24 hr capsule Take 1 capsule (120 mg total) by mouth daily.  . furosemide (LASIX) 20 MG tablet TAKE TWO TABLETS BY MOUTH ONCE DAILY.  . gMarland Kitchenbapentin  (  NEURONTIN) 100 MG capsule TAKE ONE CAPSULE BY MOUTH THREE TIMES DAILY  . glipiZIDE (GLUCOTROL) 10 MG tablet TAKE TWO TABLETS BY MOUTH TWICE DAILY  . glucose blood test strip Use as instructed  . magnesium oxide (MAG-OX) 400 (241.3 MG) MG tablet TAKE ONE TABLET BY MOUTH TWICE DAILY  . metFORMIN (GLUCOPHAGE) 500 MG tablet TAKE TWO TABLETS BY MOUTH once  DAILY WITH MEALS  . nitroGLYCERIN (NITROSTAT) 0.4 MG SL tablet Place 1 tablet (0.4 mg total) under the tongue every 5 (five) minutes as needed for chest pain (up to 3 doses).  [name] oxybutynin (DITROPAN) 5 MG tablet Take 1 tablet (5 mg total) by mouth 2 (two) times daily.  . Rivaroxaban (XARELTO) 15 MG TABS tablet Take 1 tablet (15 mg total) by mouth daily with supper.  . traMADol (ULTRAM) 50 MG tablet Take 1/2-1 tablet by mouth three times a day as needed for pain  . vitamin B-12 (CYANOCOBALAMIN) 1000 MCG tablet Take 1,000 mcg by mouth every morning.   . [DISCONTINUED] glipiZIDE (GLUCOTROL) 10 MG tablet Take 2 tablets by mouth every morning  . [DISCONTINUED] gabapentin (NEURONTIN) 100 MG capsule TAKE ONE CAPSULE BY MOUTH THREE TIMES DAILY   No facility-administered encounter medications on file as of 04/23/2015.     Allergies  Allergen Reactions  . Peanut-Containing Drug Products Anaphylaxis and Swelling  . Azithromycin Other (See Comments)    REACTION: pt states INTOL to ZPak  . Flomax [Tamsulosin Hcl] Swelling  . Pioglitazone Other (See Comments)    edema  . Pregabalin Other (See Comments)    REACTION: pt states INTOL to Lyrica  . Sulfonamide Derivatives Swelling    Review of Systems        See HPI - all other systems neg except as noted... The patient complains of weight loss, chest pain, dyspnea on exertion, and muscle weakness.  The patient denies anorexia, fever, weight gain, vision loss, decreased hearing, hoarseness, syncope, peripheral edema, prolonged cough, headaches, hemoptysis, abdominal pain, melena, hematochezia, severe  indigestion/heartburn, hematuria, incontinence, suspicious skin lesions, transient blindness, difficulty walking, depression, unusual weight change, abnormal bleeding, enlarged lymph nodes, and angioedema.     Objective:   Physical Exam      WD, WN, 82 y/o WF in NAD...  GENERAL:  Alert & oriented; pleasant & cooperative... HEENT:  /AT, EOM-wnl, PERRLA, EACs-clear, TMs-wnl, NOSE-clear, THROAT-clear & wnl. NECK:  Supple w/ fairROM; no JVD; normal carotid impulses w/o bruits; no thyromegaly or nodules palpated; no lymphadenopathy. CHEST:  Clear to P & A; without wheezes/ rales/ or rhonchi; +tender left 11th-12th ribs & costal margin. HEART:  Iregular Rhythm= AFib; Gr1-2 SEM without rubs or gallops heard... ABDOMEN:  Soft & nontender; normal bowel sounds; no organomegaly or masses detected. EXT: without deformities, mild arthritic changes; no varicose veins/ venous insuffic/ or edema. NEURO:  CN's intact;  no focal neuro deficits found... DERM:  No lesions noted; no rash etc...  RADIOLOGY DATA:  Reviewed in the EPIC EMR & discussed w/ the patient...  LABORATORY DATA:  Reviewed in the EPIC EMR & discussed w/ the patient...   Assessment & Plan:    HBP>  Stable on BBlocker, CCB, Lasix20; continue same... SHE DOES NOT KNOW HER MEDS & DID NOT BRING BOTTLES.  CAD>  Hosp 5/14 for NSTEMI=> stent to RCA; Hosp 3/15 w/ NSTEMI & cath showed widely patent coronaries & EF=50%...  AFIB>  She had abn 2DEcho in Hosp 11/11 & 1/13- reviewed by Cards- she sees DrTaylor et al &  on XARELTO15 due to epistaxis...  CHOL>  FLP looked fair on the Simva40 + diet rx; switched to [place] during the 5/14 [facility] for NSTEMI & FLP looks good when she takes it every day!...  DM>  A1c is 10.6 now on Metform500-2Qam & Glipiz10-2Qam & she refuses additional meds + she is NOT taking these meds everyday!  Asked to take all meds as prescribed regularly & once again asked to see ENDOCRINE for DM management.  RENAL INFARCTION>   Adm 1/13 w/ renal infarcts felt due to micro-emboli & inadeq INR ==> changed to XARELTO... URINARY INCONTINENCE>  Followed by Eli Hose now Eskridge & she feels that there is nothing they can do to help her; wears pads; offered second opinion & she will consider it...  ORTHO>  Followed by DrNorris/ [facility] & she is s/p cortisone shots in right knee & ?Synvisc series; she had arthroscopic surg for torn cartilage 9/12 she says & she doesn't want TKR.  STROKE>  She has had a CVA w/ hosp by Neuro 11/11 [facility] records all reviewed);  She was continued on her Coumadin per DrWillis & followed in [facility]=> changed to [place] 2013 as noted;  Stable w/o recurrent cerebral ischemic symptoms...  Other medical problems as noted... She will continue the Calcium, MVI, Vit D, & Vit B12 regimen...   Patient's Medications  New Prescriptions   No medications on file  Previous Medications   ACETAMINOPHEN (TYLENOL) 500 MG TABLET    Take 500 mg by mouth every 6 (six) hours as needed for mild pain.   ATENOLOL (TENORMIN) 50 MG TABLET    TAKE ONE TABLET BY MOUTH ONCE DAILY   ATORVASTATIN (LIPITOR) 40 MG TABLET    TAKE ONE TABLET BY MOUTH ONCE DAILY AT 6PM   BLOOD GLUCOSE MONITORING SUPPL (ACCU-CHEK AVIVA PLUS) W/DEVICE KIT    Test blood sugar once daily   DILTIAZEM (CARTIA XT) 120 MG 24 HR CAPSULE    Take 1 capsule (120 mg total) by mouth daily.   FUROSEMIDE (LASIX) 20 MG TABLET    TAKE TWO TABLETS BY MOUTH ONCE DAILY.   GABAPENTIN (NEURONTIN) 100 MG CAPSULE    TAKE ONE CAPSULE BY MOUTH THREE TIMES DAILY   GLIPIZIDE (GLUCOTROL) 10 MG TABLET    TAKE TWO TABLETS BY MOUTH TWICE DAILY   GLUCOSE BLOOD TEST STRIP    Use as instructed   MAGNESIUM OXIDE (MAG-OX) 400 (241.3 MG) MG TABLET    TAKE ONE TABLET BY MOUTH TWICE DAILY   METFORMIN (GLUCOPHAGE) 500 MG TABLET    TAKE TWO TABLETS BY MOUTH once  DAILY WITH MEALS   NITROGLYCERIN (NITROSTAT) 0.4 MG SL TABLET    Place 1 tablet (0.4 mg total) under the tongue every 5  (five) minutes as needed for chest pain (up to 3 doses).   OXYBUTYNIN (DITROPAN) 5 MG TABLET    Take 1 tablet (5 mg total) by mouth 2 (two) times daily.   RIVAROXABAN (XARELTO) 15 MG TABS TABLET    Take 1 tablet (15 mg total) by mouth daily with supper.   TRAMADOL (ULTRAM) 50 MG TABLET    Take 1/2-1 tablet by mouth three times a day as needed for pain   VITAMIN B-12 (CYANOCOBALAMIN) 1000 MCG TABLET    Take 1,000 mcg by mouth every morning.   Modified Medications   No medications on file  Discontinued Medications   GABAPENTIN (NEURONTIN) 100 MG CAPSULE    TAKE ONE CAPSULE BY MOUTH THREE TIMES DAILY  GLIPIZIDE (GLUCOTROL) 10 MG TABLET    Take 2 tablets by mouth every morning

## 2015-04-27 ENCOUNTER — Other Ambulatory Visit: Payer: Self-pay | Admitting: Pulmonary Disease

## 2015-04-27 DIAGNOSIS — IMO0002 Reserved for concepts with insufficient information to code with codable children: Secondary | ICD-10-CM

## 2015-04-27 DIAGNOSIS — E1140 Type 2 diabetes mellitus with diabetic neuropathy, unspecified: Secondary | ICD-10-CM

## 2015-04-27 DIAGNOSIS — E1165 Type 2 diabetes mellitus with hyperglycemia: Principal | ICD-10-CM

## 2015-04-27 MED ORDER — ATORVASTATIN CALCIUM 40 MG PO TABS: ORAL_TABLET | ORAL | Status: DC

## 2015-04-27 NOTE — Progress Notes (Signed)
Quick Note:  Called and spoke to pt. Informed her of the results and recs per SN. Order placed for referral and new Lipitor rx sent to preferred pharmacy. Pt verbalized understanding and denied any further questions or concerns at this time.   ______

## 2015-05-02 ENCOUNTER — Other Ambulatory Visit: Payer: Self-pay | Admitting: Pulmonary Disease

## 2015-05-07 ENCOUNTER — Other Ambulatory Visit: Payer: Self-pay | Admitting: Internal Medicine

## 2015-05-25 ENCOUNTER — Telehealth: Payer: Self-pay | Admitting: Pulmonary Disease

## 2015-05-25 MED ORDER — ACCU-CHEK AVIVA PLUS W/DEVICE KIT: PACK | Status: DC

## 2015-05-25 NOTE — Telephone Encounter (Signed)
Spoke with the pt  She is asking for new rx for blood glusose meter  I have sent this to [facility] per her request  She states she pulled a tick off of her yesterday  She states that she cleaned the area, it itches a little but no pain or redness  I advised use benadryl cream for the itching, and monitor the site for redness or swelling  She will call back if needed

## 2015-06-19 ENCOUNTER — Telehealth: Payer: Self-pay | Admitting: Pulmonary Disease

## 2015-06-19 NOTE — Telephone Encounter (Signed)
Spoke with pt. States that she is "breaking out" on her bottom and the sides of her vagina. She has developed and rash and few bumps. Rash is itching and is red. States that she recently changed her pads that uses for bladder leakage. Denies changing laundry detergent or body soap. Has been using oral Benadryl and Benadryl cream with minimal relief. Would like SN's recommendations.  SN - please advise. Thanks.  Allergies  Allergen Reactions  . Peanut-Containing Drug Products Anaphylaxis and Swelling  . Azithromycin Other (See Comments)    REACTION: pt states INTOL to ZPak  . Flomax [Tamsulosin Hcl] Swelling  . Pioglitazone Other (See Comments)    edema  . Pregabalin Other (See Comments)    REACTION: pt states INTOL to Lyrica  . Sulfonamide Derivatives Swelling

## 2015-06-19 NOTE — Telephone Encounter (Signed)
Per SN >> pt needs to be seen. Hard to treat something like this over the phone. See if she has a dermatologist, GYN or Urologist. If she doesn't she will need to be evaluated by Urgent Care.   Pt is aware of SN's recommendations. She will see if her son will take her to an urgent care.

## 2015-06-22 ENCOUNTER — Telehealth: Payer: Self-pay | Admitting: Internal Medicine

## 2015-06-22 NOTE — Telephone Encounter (Signed)
New message  Patient calling the office for samples of medication:   1.  What medication and dosage are you requesting samples for? Xarelto  2.  Are you currently out of this medication? No! She has a few more left but her son is coming in to town and she was going to ask him to pick them up for her.

## 2015-06-22 NOTE — Telephone Encounter (Signed)
Patient calling the office for samples of medication:   1.  What medication and dosage are you requesting samples for? XARELTO 20MG  1XDAY  2.  Are you currently out of this medication? NO HAVE A FEW LEFT

## 2015-06-23 NOTE — Telephone Encounter (Signed)
Pt calling again to see if we have Xarelto samples-son will be in town today and would like to pick up

## 2015-06-23 NOTE — Telephone Encounter (Signed)
Follow Up:   Pt calling again to see if we had any samples of Xarelto.Please call asap and let her know,so she can get her son to pick them up.

## 2015-06-23 NOTE — Telephone Encounter (Signed)
Patient aware samples placed at the front desk. 

## 2015-07-23 ENCOUNTER — Other Ambulatory Visit: Payer: Self-pay | Admitting: Internal Medicine

## 2015-07-26 ENCOUNTER — Other Ambulatory Visit: Payer: Self-pay | Admitting: Pulmonary Disease

## 2015-07-29 ENCOUNTER — Telehealth: Payer: Self-pay | Admitting: Pulmonary Disease

## 2015-07-29 MED ORDER — GLUCOSE BLOOD VI STRP: ORAL_STRIP | Status: DC

## 2015-07-29 NOTE — Telephone Encounter (Signed)
Spoke with Sonia Baller at [facility]. Informed that the dx code was missing on the rx. rx corrected and resent to pharmacy. Pt line was giving a busy tone. Will try pt later

## 2015-08-04 LAB — HM DIABETES EYE EXAM

## 2015-08-12 ENCOUNTER — Telehealth: Payer: Self-pay | Admitting: Internal Medicine

## 2015-08-12 NOTE — Telephone Encounter (Signed)
New Message  Pt verbalized she is

## 2015-08-18 ENCOUNTER — Encounter: Payer: Self-pay | Admitting: Pulmonary Disease

## 2015-08-19 ENCOUNTER — Ambulatory Visit (HOSPITAL_COMMUNITY)
Admission: EM | Admit: 2015-08-19 | Discharge: 2015-08-19 | Disposition: A | Discharge status: Home / Self Care | Payer: Medicare Other | Attending: Family Medicine | Admitting: Family Medicine

## 2015-08-19 ENCOUNTER — Encounter (HOSPITAL_COMMUNITY): Payer: Self-pay | Admitting: Family Medicine

## 2015-08-19 ENCOUNTER — Telehealth: Payer: Self-pay | Admitting: Pulmonary Disease

## 2015-08-19 DIAGNOSIS — B3731 Acute candidiasis of vulva and vagina: Secondary | ICD-10-CM

## 2015-08-19 DIAGNOSIS — B369 Superficial mycosis, unspecified: Secondary | ICD-10-CM

## 2015-08-19 DIAGNOSIS — B373 Candidiasis of vulva and vagina: Secondary | ICD-10-CM

## 2015-08-19 MED ORDER — CLOTRIMAZOLE 1 % EX CREA: TOPICAL_CREAM | CUTANEOUS | 1 refills | Status: DC

## 2015-08-19 MED ORDER — FLUCONAZOLE 150 MG PO TABS: ORAL_TABLET | ORAL | 0 refills | Status: DC

## 2015-08-19 NOTE — Discharge Instructions (Signed)
Use the cream for 4 weeks.  If your symptoms do not improve, please schedule an appointment for reevaluation.

## 2015-08-19 NOTE — Telephone Encounter (Signed)
Per SN: we are booked at this time and SN has never evaluated or treated this problem before  She should go to an UC asap  I spoke with the pt and notified of this and she verbalized understanding and nothing further needed

## 2015-08-19 NOTE — ED Provider Notes (Signed)
CSN: [id number]     Arrival date & time 08/19/15  1030 History   First MD Initiated Contact with Patient 08/19/15 1112     Chief Complaint  Patient presents with  . Pruritis   (Consider location/radiation/quality/duration/timing/severity/associated sxs/prior Treatment) HPI Patient reports rectal itching for several months.  She reports rectal itching throughout the day.  She has used OTC pinworm medication (2-3 doses).  No hematochezia but reports possible melena.    She reports that she has urinary incontinence for several years.  She reports occ bleeding after scratching vagina.  No fevers, chills, nausea, vomiting, dysuria, hematuria, urgency, urinary frequency.  She reports chronic back pain.  She reports a rash on her groin that has been present for several months.  She reports pruritis.  She notes that she itches so often that she has skin breakdown.  She has used several OTC ointments with no improvement.  She has used hydrocortisone cream with no improvement.  Past Medical History:  Diagnosis Date  . AF (atrial fibrillation) (HCC)    a. Chronic Xarelto  . Anxiety state, unspecified   . Benign neoplasm of colon   . CAD (coronary artery disease)    a. 05/2012 NSTEMI/Cath/PCI: LM nl, LAD min irregs, LCX min irregs, RI 40-50 ost, OM1 nl, RCA dom, 26m (4.0x12 Vision BMS), PD/PL nl, EF 55%, at least mod MR. b. NSTEMI 03/2013 - cath with widely patent coronaries, suspect demand ischemia due to RVR.  [name] Chronic combined systolic and diastolic CHF (congestive heart failure) (HCC)    a. 01/2011 Echo: EF 40-45%, diff HK, diast dysfxn, mild to mod MR, mod to sev dil LA/RV/RA, mod to sev reduced RV fxn, mod TR, PASP .;  b.  Echo 06/15/12:  EF 55%, mild MR, severe BAE, mod TR, PASP 35  . Diaphragmatic hernia without mention of obstruction or gangrene   . Epistaxis    a. s/p cauterization 11/2012.  [name] History of stroke   . HTN (hypertension)   . Hx of cardiovascular stress test    a. Myoview in  2006 was negative for ischemia, EF 54%. Micah Flesher on to have CAD development 05/2012.)  . Hypomagnesemia   . Lumbago   . Memory loss   . Mitral regurgitation    a. mild by echo 06/2012.  . Mixed incontinence urge and stress (female)(female)   . Osteoarthrosis, unspecified whether generalized or localized, unspecified site   . Osteoporosis, unspecified   . Pure hypercholesterolemia   . Renal infarction (HCC)    01/2011 in setting of low INR  . Type II or unspecified type diabetes mellitus without mention of complication, not stated as uncontrolled    Past Surgical History:  Procedure Laterality Date  . cataract surgery/both eyes     01/2012 left eye---02/2012 right eye  . CHOLECYSTECTOMY    . LEFT HEART CATHETERIZATION WITH CORONARY ANGIOGRAM N/A 05/28/2012   Procedure: LEFT HEART CATHETERIZATION WITH CORONARY ANGIOGRAM;  Surgeon: Tonny Bollman, MD;  Location[facility] CATH LAB;  Service: Cardiovascular;  Laterality: N/A;  . LEFT HEART CATHETERIZATION WITH CORONARY ANGIOGRAM N/A 03/14/2013   Procedure: LEFT HEART CATHETERIZATION WITH CORONARY ANGIOGRAM;  Surgeon: Lesleigh Noe, MD;  Location: [facility] CATH LAB;  Service: Cardiovascular;  Laterality: N/A;  . PERCUTANEOUS CORONARY STENT INTERVENTION (PCI-S)  05/28/2012   Procedure: PERCUTANEOUS CORONARY STENT INTERVENTION (PCI-S);  Surgeon: Tonny Bollman, MD;  Location: [facility] CATH LAB;  Service: Cardiovascular;;  . rt.leg surgery  11/2010   Family History  Problem Relation Age  of Onset  . Stomach cancer Father   . Pneumonia Mother   . CAD Brother    Social History  Substance Use Topics  . Smoking status: Never Smoker  . Smokeless tobacco: Never Used  . Alcohol use No   OB History    No data available     Review of Systems  Constitutional: Negative for chills, diaphoresis and fever.  Gastrointestinal: Positive for anal bleeding (after scratching). Negative for abdominal pain, constipation, nausea, rectal pain and vomiting.  Genitourinary: Positive  for vaginal bleeding (after scratching). Negative for dysuria, frequency, hematuria, pelvic pain, urgency and vaginal discharge.  Musculoskeletal: Positive for back pain (chronic).  Skin: Positive for rash (vagina).    Allergies  Peanut-containing drug products; Azithromycin; Flomax [tamsulosin hcl]; Pioglitazone; Pregabalin; and Sulfonamide derivatives  Home Medications   Prior to Admission medications   Medication Sig Start Date End Date Taking? Authorizing Provider  acetaminophen (TYLENOL) 500 MG tablet Take 500 mg by mouth every 6 (six) hours as needed for mild pain. 03/15/13   Dayna N Dunn, PA-C  atenolol (TENORMIN) 50 MG tablet TAKE ONE TABLET BY MOUTH ONCE DAILY 01/01/15   Evans Lance, MD  atorvastatin (LIPITOR) 40 MG tablet TAKE ONE TABLET BY MOUTH ONCE DAILY AT [facility] 04/27/15   Noralee Space, MD  Blood Glucose Monitoring Suppl (ACCU-CHEK AVIVA PLUS) w/Device KIT Test blood sugar once daily 05/25/15   Noralee Space, MD  clotrimazole (LOTRIMIN) 1 % cream Apply to affected area near rectum twice daily for 4 weeks. 08/19/15   Prima Rayner Windell Moulding, DO  diltiazem (CARTIA XT) 120 MG 24 hr capsule Take 1 capsule (120 mg total) by mouth daily. 01/20/15   Evans Lance, MD  fluconazole (DIFLUCAN) 150 MG tablet Take 1 tablet now.  May repeat dose in 3 days if persistent vaginal itching. 08/19/15   Janora Norlander, DO  furosemide (LASIX) 20 MG tablet TAKE TWO TABLETS BY MOUTH ONCE DAILY 05/07/15   Evans Lance, MD  gabapentin (NEURONTIN) 100 MG capsule TAKE ONE CAPSULE BY MOUTH THREE TIMES DAILY 05/04/15   Noralee Space, MD  glipiZIDE (GLUCOTROL) 10 MG tablet TAKE TWO TABLETS BY MOUTH TWICE DAILY 02/26/15   Noralee Space, MD  glucose blood (ACCU-CHEK AVIVA PLUS) test strip TEST BLOOD SUGAR ONCE DAILY 07/29/15   Noralee Space, MD  glucose blood test strip Use as instructed 10/16/13   Noralee Space, MD  MAGNESIUM-OXIDE 400 (241.3 Mg) MG tablet TAKE ONE TABLET BY MOUTH TWICE DAILY 07/24/15   Evans Lance,  MD  metFORMIN (GLUCOPHAGE) 500 MG tablet TAKE TWO TABLETS BY MOUTH once  DAILY WITH MEALS 10/14/13   Noralee Space, MD  nitroGLYCERIN (NITROSTAT) 0.4 MG SL tablet Place 1 tablet (0.4 mg total) under the tongue every 5 (five) minutes as needed for chest pain (up to 3 doses). 03/15/13   Dayna N Dunn, PA-C  oxybutynin (DITROPAN) 5 MG tablet Take 1 tablet (5 mg total) by mouth 2 (two) times daily. 02/25/15   Noralee Space, MD  Rivaroxaban (XARELTO) 15 MG TABS tablet Take 1 tablet (15 mg total) by mouth daily with supper. 12/18/14   Evans Lance, MD  traMADol Veatrice Bourbon) 50 MG tablet Take 1/2-1 tablet by mouth three times a day as needed for pain 10/24/14   Noralee Space, MD  vitamin B-12 (CYANOCOBALAMIN) 1000 MCG tablet Take 1,000 mcg by mouth every morning.     Historical Provider, MD  Meds Ordered and Administered this Visit  Medications - No data to display  BP 150/72 (BP Location: Right Arm)   Pulse 82   Temp 98.6 F (37 C) (Oral)   Resp 18   SpO2 98%  No data found.   Physical Exam  Constitutional: She appears well-developed and well-nourished. No distress.  Cardiovascular: Normal rate.   Pulmonary/Chest: Effort normal.  Abdominal: Soft. She exhibits no distension. There is no tenderness. There is no rebound and no guarding.  No suprapubic TTP  Genitourinary: Pelvic exam was performed with patient supine. Vaginal discharge (white curdy vaginal discharge appreciated at the introitus) found.  Genitourinary Comments: Excoriation appreciated on mucosal surface of bilateral labia majora.    Rectal exam: with minimal skin breakdown secondary to excoriation.  Satellite lesions appreciated.  No exudate, no bleeding, no evidence of secondary bacterial infection.  Skin: She is not diaphoretic.    Urgent Care Course   Clinical Course    Procedures (including critical care time)  Labs Review Labs Reviewed - No data to display  Imaging Review No results found.    MDM   1. Fungal skin  infection   2. Vulvovaginal candidiasis    Michelle Herrera is a 82 y.o. female that presents today for vaginal and rectal itching.  She voiced concern re: possible pin worm infection.  However, based on symptoms and physical exam, I have a very low suspicion of this.  Patient unable to defecate during today's appointment, so no stool studies performed.  Exam more consistent with vaginal candidiasis and fungal infection of the skin surrounding the rectum.  No evidence of secondary bacterial infection at this time.  Antifungals prescribed and reviewed with patient and son.  Return precautions reviewed.  All questions answered.  Meds ordered this encounter  Medications  . fluconazole (DIFLUCAN) 150 MG tablet    Sig: Take 1 tablet now.  May repeat dose in 3 days if persistent vaginal itching.    Dispense:  2 tablet    Refill:  0  . clotrimazole (LOTRIMIN) 1 % cream    Sig: Apply to affected area near rectum twice daily for 4 weeks.    Dispense:  45 g    Refill:  1     Brayan Votaw M. Lajuana Ripple, DO PGY-3, [place], DO 08/19/15 1204

## 2015-08-19 NOTE — ED Triage Notes (Signed)
Pt  Reports     Symptoms of rectal  Itching    And  Rash   With   Symptoms   X     sev  Months    Pt  Reports    Symptoms     Of  Pt  Has  Been taking otc  meds  For   Pinworms     She  Has  Not officially    Been told  She   Has  Pinworms      She  Reports  After a  bm    She  Also has    Frequency  Of urination     And  Some  incontince

## 2015-08-19 NOTE — Telephone Encounter (Signed)
Spoke with the pt  She states that she has had pin worms for months now  She can not see them, but she has severe itching in her rectum, esp at night  She hs tried OTC med called "reeses for pin worms" and this has not given any relief  She has no pets and has no clue how she could have developed this problem  Please advise, thanks! Allergies  Allergen Reactions  . Peanut-Containing Drug Products Anaphylaxis and Swelling  . Azithromycin Other (See Comments)    REACTION: pt states INTOL to ZPak  . Flomax [Tamsulosin Hcl] Swelling  . Pioglitazone Other (See Comments)    edema  . Pregabalin Other (See Comments)    REACTION: pt states INTOL to Lyrica  . Sulfonamide Derivatives Swelling

## 2015-09-09 ENCOUNTER — Other Ambulatory Visit: Payer: Self-pay | Admitting: Pulmonary Disease

## 2015-09-10 ENCOUNTER — Telehealth: Payer: Self-pay | Admitting: Pulmonary Disease

## 2015-09-10 NOTE — Telephone Encounter (Signed)
Attempted to call the pts son and he did not answer.  Will try back later.

## 2015-09-10 NOTE — Telephone Encounter (Signed)
Called and spoke with pts son and he stated that he has received a letter to do jury duty and he is wanting to know if SN can write him a letter so he could be excused.  He stated that he is with his mother all the time, and they dont leave her alone.  SN please advise. thanks

## 2015-09-10 NOTE — Telephone Encounter (Signed)
Per SN---  We can do the jury letter for the pts son, but he will need to bring the jury summons and drop this off.  This is for [employer].

## 2015-09-15 NOTE — Telephone Encounter (Signed)
Spoke to patient son and informed him that he needs to drop off jury summons. He states he will bring the summons in.-pr

## 2015-09-15 NOTE — Telephone Encounter (Signed)
Will send back to Leigh to keep an eye out for the [place] to be dropped off.

## 2015-09-17 NOTE — Telephone Encounter (Signed)
Summons received and placed on SN cart to be reviewed.

## 2015-09-17 NOTE — Telephone Encounter (Signed)
We received the summons took to [facility]

## 2015-09-23 ENCOUNTER — Encounter: Payer: Self-pay | Admitting: Pulmonary Disease

## 2015-09-23 NOTE — Telephone Encounter (Signed)
Spoke with pt. Made her aware that this letter has been completed by SN. Letter has been placed at the front for pick up. Nothing further was needed.

## 2015-09-23 NOTE — Telephone Encounter (Signed)
Waiting for letter to be completed.

## 2015-09-30 ENCOUNTER — Encounter: Payer: Self-pay | Admitting: Internal Medicine

## 2015-10-14 ENCOUNTER — Ambulatory Visit (INDEPENDENT_AMBULATORY_CARE_PROVIDER_SITE_OTHER): Payer: Medicare Other | Admitting: Internal Medicine

## 2015-10-14 ENCOUNTER — Encounter: Payer: Self-pay | Admitting: Internal Medicine

## 2015-10-14 ENCOUNTER — Encounter (INDEPENDENT_AMBULATORY_CARE_PROVIDER_SITE_OTHER): Payer: Self-pay

## 2015-10-14 VITALS — BP 98/70 | HR 64 | Ht 65.0 in | Wt 123.0 lb

## 2015-10-14 DIAGNOSIS — I25810 Atherosclerosis of coronary artery bypass graft(s) without angina pectoris: Secondary | ICD-10-CM | POA: Diagnosis not present

## 2015-10-14 DIAGNOSIS — I482 Chronic atrial fibrillation: Secondary | ICD-10-CM | POA: Diagnosis not present

## 2015-10-14 DIAGNOSIS — I4821 Permanent atrial fibrillation: Secondary | ICD-10-CM

## 2015-10-14 NOTE — Progress Notes (Signed)
HPI [name] returns today for followup. She has a h/o chronic atrial fibrillation, HTN, CAD s/p stent to the RCA in 2014.  Her dyspnea has improved. She has minimal edema. She notes that her appetite is down though she is not sure if she has lost any weight or not. Our scales suggest 6 lbs in the last year. No syncope. No chest pain. Minimal leg swelling. Allergies  Allergen Reactions  . Peanut-Containing Drug Products Anaphylaxis and Swelling  . Azithromycin Other (See Comments)    REACTION: pt states INTOL to ZPak  . Flomax [Tamsulosin Hcl] Swelling  . Pioglitazone Other (See Comments)    edema  . Pregabalin Other (See Comments)    REACTION: pt states INTOL to Lyrica  . Sulfonamide Derivatives Swelling     Current Outpatient Prescriptions  Medication Sig Dispense Refill  . acetaminophen (TYLENOL) 500 MG tablet Take 500 mg by mouth every 6 (six) hours as needed for mild pain.    [name] atenolol (TENORMIN) 50 MG tablet TAKE ONE TABLET BY MOUTH ONCE DAILY 90 tablet 3  . diltiazem (CARTIA XT) 120 MG 24 hr capsule Take 1 capsule (120 mg total) by mouth daily. 30 capsule 11  . glipiZIDE (GLUCOTROL) 10 MG tablet TAKE TWO TABLETS BY MOUTH TWICE DAILY 120 tablet 2  . MAGNESIUM-OXIDE 400 (241.3 Mg) MG tablet TAKE ONE TABLET BY MOUTH TWICE DAILY 60 tablet 4  . metFORMIN (GLUCOPHAGE) 500 MG tablet TAKE TWO TABLETS BY MOUTH TWICE DAILY WITH MEALS 120 tablet 3  . nitroGLYCERIN (NITROSTAT) 0.4 MG SL tablet Place 1 tablet (0.4 mg total) under the tongue every 5 (five) minutes as needed for chest pain (up to 3 doses). 25 tablet 3  . oxybutynin (DITROPAN) 5 MG tablet Take 1 tablet (5 mg total) by mouth 2 (two) times daily. 60 tablet prn  . Rivaroxaban (XARELTO) 15 MG TABS tablet Take 1 tablet (15 mg total) by mouth daily with supper. 30 tablet 11  . vitamin B-12 (CYANOCOBALAMIN) 1000 MCG tablet Take 1,000 mcg by mouth every morning.     [name] atorvastatin (LIPITOR) 40 MG tablet TAKE ONE TABLET BY  MOUTH ONCE DAILY AT 6PM 30 tablet 6  . Blood Glucose Monitoring Suppl (ACCU-CHEK AVIVA PLUS) w/Device KIT Test blood sugar once daily 1 kit 0  . fluconazole (DIFLUCAN) 150 MG tablet Take 1 tablet now.  May repeat dose in 3 days if persistent vaginal itching. 2 tablet 0  . furosemide (LASIX) 20 MG tablet TAKE TWO TABLETS BY MOUTH ONCE DAILY 60 tablet 7  . gabapentin (NEURONTIN) 100 MG capsule TAKE ONE CAPSULE BY MOUTH THREE TIMES DAILY (Patient not taking: Reported on 10/14/2015) 90 capsule 2  . glucose blood (ACCU-CHEK AVIVA PLUS) test strip TEST BLOOD SUGAR ONCE DAILY 100 each 1  . glucose blood test strip Use as instructed 100 each 0  . traMADol (ULTRAM) 50 MG tablet Take 1/2-1 tablet by mouth three times a day as needed for pain (Patient not taking: Reported on 10/14/2015) 90 tablet 0   No current facility-administered medications for this visit.      Past Medical History:  Diagnosis Date  . AF (atrial fibrillation) ([place])    a. Chronic Xarelto  . Anxiety state, unspecified   . Benign neoplasm of colon   . CAD (coronary artery disease)    a. 05/2012 NSTEMI/Cath/PCI: LM nl, LAD min irregs, LCX min irregs, RI 40-50 ost, OM1 nl, RCA dom, 42m(4.0x12 Vision BMS), PD/PL nl,  EF 55%, at least mod MR. b. NSTEMI 03/2013 - cath with widely patent coronaries, suspect demand ischemia due to RVR.  [name] Chronic combined systolic and diastolic CHF (congestive heart failure) ([place])    a. 01/2011 Echo: EF 40-45%, diff HK, diast dysfxn, mild to mod MR, mod to sev dil LA/RV/RA, mod to sev reduced RV fxn, mod TR, PASP 42mHg.;  b.  Echo 06/15/12:  EF 55%, mild MR, severe BAE, mod TR, PASP 35  . Diaphragmatic hernia without mention of obstruction or gangrene   . Epistaxis    a. s/p cauterization 11/2012.  .Marland KitchenHistory of stroke   . HTN (hypertension)   . Hx of cardiovascular stress test    a. Myoview in 2006 was negative for ischemia, EF 54%. (Martin Majesticon to have CAD development 05/2012.)  . Hypomagnesemia   . Lumbago   .  Memory loss   . Mitral regurgitation    a. mild by echo 06/2012.  . Mixed incontinence urge and stress (female)(female)   . Osteoarthrosis, unspecified whether generalized or localized, unspecified site   . Osteoporosis, unspecified   . Pure hypercholesterolemia   . Renal infarction ([place]    01/2011 in setting of low INR  . Type II or unspecified type diabetes mellitus without mention of complication, not stated as uncontrolled     ROS:   All systems reviewed and negative except as noted in the HPI.   Past Surgical History:  Procedure Laterality Date  . cataract surgery/both eyes     01/2012 left eye---02/2012 right eye  . CHOLECYSTECTOMY    . LEFT HEART CATHETERIZATION WITH CORONARY ANGIOGRAM N/A 05/28/2012   Procedure: LEFT HEART CATHETERIZATION WITH CORONARY ANGIOGRAM;  Surgeon: MSherren Mocha MD;  [facility];  Service: Cardiovascular;  Laterality: N/A;  . LEFT HEART CATHETERIZATION WITH CORONARY ANGIOGRAM N/A 03/14/2013   Procedure: LEFT HEART CATHETERIZATION WITH CORONARY ANGIOGRAM;  Surgeon: HSinclair Grooms MD;  [facility];  Service: Cardiovascular;  Laterality: N/A;  . PERCUTANEOUS CORONARY STENT INTERVENTION (PCI-S)  05/28/2012   Procedure: PERCUTANEOUS CORONARY STENT INTERVENTION (PCI-S);  Surgeon: MSherren Mocha MD;  [facility];  Service: Cardiovascular;;  . rt.leg surgery  11/2010     Family History  Problem Relation Age of Onset  . Stomach cancer Father   . Pneumonia Mother   . CAD Brother      Social History   Social History  . Marital status: Widowed    Spouse name: N/A  . Number of children: 6  . Years of education: N/A   Occupational History  .  Retired   Social History Main Topics  . Smoking status: Never Smoker  . Smokeless tobacco: Never Used  . Alcohol use No  . Drug use: No  . Sexual activity: No   Other Topics Concern  . Not on file   Social History Narrative   1 sister--in poor health   1 brother hx of  kidney stones and heart problems     BP 98/70   Pulse 64   Ht _0  (1.651 m)   Wt 123 lb (55.8 kg)   BMI 20.47 kg/m   Physical Exam:  stable appearing elderly woman, NAD HEENT: Unremarkable Neck:  6 cm JVD, no thyromegally Back:  No CVA tenderness Lungs:  Clear with no wheezes HEART:  IRegular rate rhythm, no murmurs, no rubs, no clicks Abd:  soft, positive bowel sounds, no organomegally, no rebound, no guarding Ext:  2  plus pulses, no edema, no cyanosis, no clubbing Skin:  No rashes no nodules Neuro:  CN II through XII intact, motor grossly intact    Assess/Plan: 1. CAD - she denies anginal symptoms. No change in her meds.  2. HTN - her blood pressure is well controlled. Will follow. 3. Atrial fib - her ventricular rate is well controlled. No change in meds. She has not had any bleeding recently. 4. Preop - she is to undergo dental extraction. From my perspective, she can stop her Xarelto for 2 days before the extraction. Restart when bleeding risk is acceptable.  Mikle Bosworth.D.

## 2015-10-14 NOTE — Patient Instructions (Addendum)

## 2015-10-21 ENCOUNTER — Other Ambulatory Visit: Payer: Self-pay | Admitting: Pulmonary Disease

## 2015-10-26 ENCOUNTER — Ambulatory Visit: Payer: Medicare Other | Admitting: Pulmonary Disease

## 2015-10-26 ENCOUNTER — Encounter: Payer: Self-pay | Admitting: Pulmonary Disease

## 2015-10-26 ENCOUNTER — Other Ambulatory Visit (INDEPENDENT_AMBULATORY_CARE_PROVIDER_SITE_OTHER): Payer: Medicare Other

## 2015-10-26 VITALS — BP 124/82 | HR 74 | Temp 97.3°F | Ht 65.0 in | Wt 126.4 lb

## 2015-10-26 DIAGNOSIS — E1165 Type 2 diabetes mellitus with hyperglycemia: Secondary | ICD-10-CM

## 2015-10-26 DIAGNOSIS — E1140 Type 2 diabetes mellitus with diabetic neuropathy, unspecified: Secondary | ICD-10-CM

## 2015-10-26 DIAGNOSIS — I4821 Permanent atrial fibrillation: Secondary | ICD-10-CM

## 2015-10-26 DIAGNOSIS — I10 Essential (primary) hypertension: Secondary | ICD-10-CM

## 2015-10-26 DIAGNOSIS — I2510 Atherosclerotic heart disease of native coronary artery without angina pectoris: Secondary | ICD-10-CM

## 2015-10-26 DIAGNOSIS — I5042 Chronic combined systolic (congestive) and diastolic (congestive) heart failure: Secondary | ICD-10-CM

## 2015-10-26 DIAGNOSIS — I482 Chronic atrial fibrillation: Secondary | ICD-10-CM

## 2015-10-26 DIAGNOSIS — IMO0002 Reserved for concepts with insufficient information to code with codable children: Secondary | ICD-10-CM

## 2015-10-26 DIAGNOSIS — E7800 Pure hypercholesterolemia, unspecified: Secondary | ICD-10-CM | POA: Diagnosis not present

## 2015-10-26 DIAGNOSIS — K449 Diaphragmatic hernia without obstruction or gangrene: Secondary | ICD-10-CM | POA: Diagnosis not present

## 2015-10-26 DIAGNOSIS — Z23 Encounter for immunization: Secondary | ICD-10-CM | POA: Diagnosis not present

## 2015-10-26 LAB — HEMOGLOBIN A1C: HEMOGLOBIN A1C: 10.6 % — AB (ref 4.6–6.5)

## 2015-10-26 LAB — BASIC METABOLIC PANEL
BUN: 19 mg/dL (ref 6–23)
CHLORIDE: 99 meq/L (ref 96–112)
CO2: 31 mEq/L (ref 19–32)
CREATININE: 0.92 mg/dL (ref 0.40–1.20)
Calcium: 10 mg/dL (ref 8.4–10.5)
GFR: 61.94 mL/min (ref >60.00)
Glucose, Bld: 296 mg/dL — ABNORMAL HIGH (ref 70–99)
POTASSIUM: 4.3 meq/L (ref 3.5–5.1)
SODIUM: 139 meq/L (ref 135–145)

## 2015-10-26 NOTE — Progress Notes (Signed)
Subjective:    Patient ID: Michelle Herrera, female    DOB: 1930-04-07, 82 y.o.   MRN: [id number]  HPI 82 y/o WF here for a follow up visit...  SEE PREV EPIC NOTES FOR OLDER DATA >>      Adm 1/13 w/ renal infarcts felt due to micro-emboli & inadeq INR ==> changed to [place].  Hosp 5/14 for NSTEMI=> stent to RCA.  LABS 10/14:  FLP- looks good on Lip40;  Chems- ok x BS=181, A1c=8.8.Marland KitchenMarland Kitchen  Hosp 3/15 w/ NSTEMI & cath showed widely patent coronaries & EF=50%.  LABS 4/15:  Chems- ok x BS=181, A1c=9.3;  LFTs- wnl;  CBC- wnl;  Fe=121 (31%sat);    ADDENDUM>> she notes that she has no appetite & "not eating"; she has lost 21# in 29modown to 109# & BMI=18; but serum proteins WNL; we will treat w/ MEGACE 2064mbefore meals Tid... ~  October 23, 2013:  BeTrinitias decreased both of her DM meds in half as she says she doesn't tolerate the max doses due to "feeling bad, GI upset, & diarrhea";  She has DM w/ neuropathy> prev on Metform500-2Bid & Glipiz10-2Bid but INTOL she says & decreased to 2 of each Qam only; prev followed by DrEllison but last seen 6/13- Actos stopped due to edema; won't take additional meds due to cost; refuses insulin shots; she claims that BS higher when her knee is hurting; Labs 10/15 showed BS=172, A1c=8.4 & asked to ADD Metform500 & Glipiz10 (one of each) in PM at dinner => NOTE: she did NOT incr the Metform & glipizide as requested...   ~  April 24, 2014:  38m69moVParadiseates she's doing well- CC is hip pain & she notes that Tylenol helps;     HBP, CAD, cardiomyop (sys & diast CHF), MR> on Aten50, Cardizem120, Lasix20-2/d; BP= 110/70 & she reports doing well- no CP etc...    PAF, CHB, pacer> followed by DrTaylor on Xarelto15 (decreased due to epistaxis); she reports stable- no CP, palpit, dizzy, nose bleeds etc...    Chol> on Lip40; FLP 4/16 showed TChol 214, TG 106, HDL 86, LDL 107... We reviewed diet, continue same med.    DM> on Metform500-2Qam & Glucotrol10-2Qam; compliance  is ?? She has been reluctant to follow my recs for increased meds; Labs 4/16 showed BS=175, A1c=9.4 => refer to [facility] for DM management.    Other problems as listed> on Neurontin100Tid, MVI, B12-1000/d... We reviewed prob list, meds, xrays and labs> see below for updates >>   CXR 4/16 showed mod cardiomeg, hyperinflation, sl blunt angles c/w pleural thickening, DJD Tspine, osteopenia, NAD...  LABS 4/16:  FLP- note quite at goals;  Chems- ok x BS=175, A1c=9.4;  CBC- wnl;  TSH=4.61;  BNP=438...  ADDENDUM>> she REFUSED referral to Endocrinology for DM mamagement, refuses to increase her DM meds per my recommendations, refuses to start insulin therapy; she says that she will continue current meds and get on diet + exercise to get her BS improved & A1c better...  ~  October 24, 2014:  BetKavinaports a quiet interval, notes some LBP & this is helped by Tramadol 1/2 tab + Tylenol...    HBP, CAD, cardiomyop (sys & diast CHF), MR> on Aten50, Cardizem120, Lasix20-2/d; BP= 138/64 & she reports doing well- no CP etc; she needs incr exercise...    PAF, CHB, pacer> followed by DrTaylor on Xarelto15/d (decreased due to epistaxis); she reports stable- no CP, palpit, dizzy, nose bleeds etc...[name]  Chol> on Lip40; FLP 4/16 showed TChol 214, TG 106, HDL 86, LDL 107... We reviewed diet, continue same med.    DM> on Metform500-2Qam & Glucotrol10-2Qam; compliance is ?? She has been reluctant to follow my recs for increased meds; Labs 4/16 showed BS=175, A1c=9.4 => refer to [facility] for DM management (she refused again).    Other problems as listed> on Neurontin100Tid, MVI, B12-1000/d... We reviewed prob list, meds, xrays and labs>  IMP/PLANS>>  Reminded to take meds regularly & again asked to bring all med bottles to every office visit; she continues to refuse to increase her DM meds or go to an endocrinologist for DM management; Her CC is the back pain but Tramadol50 makes her too groggy- rec to take 1/2 tab +  Tylenol, up to Tid as needed...   ~  April 23, 2015:  757moRBerlinhad some dental work w/ a tooth pulled 03/19/15- developed some bleeding that wouldn't stop & went to ER (she is on Xarelto- held that one day); treated w/ gauze & a tea-bag w/ control...     She had f/u Cards- DrTaylor 12/18/14> note reviewed- chr AFib, HBP, CAD s/p stent to RCA 2014, combined sys & diast CHF, valv dis w/ mod MR/ severe TR/ and mod PulmHTN (590mg); no CP, chrDOE (class2); Same meds- on Xarelto, Aten50, Cardizem120, Lasix40, & no changes...     HBP> on Aten50, Diltiazem120 & Lasix20-2Qam; BP= 138/86 & she denies CP/ ch in SOB/ edema, notes occas palpit...    CAD, Cardiomyopathy (sys & diast CHF), Valvular Dis w/ MR etc> off ASA81 due to epistaxis, on Xarelto15 (lowered dose due to nose bleed) & above meds; Adm 5/14 by [facility] w/ NSTEMI, Cath=> stent to 95%RCA; followed by DrTaylor; Adm 1/15 & 3/15 w/ AFib, RVR, NSTEMI due to demand ischemia and recath w/ widely patent coronaries, EF=50%; meds adjusted...    AFib, CHB & Pacer> on Xarelto at 15 (due to epistaxis); followed by DrTaylor & seen 12/16> cardiology notes are reviewed...    Chol> on Lip40; weight is up to 128#, BMI=21; FLP 4/16 shows TChol 214, TG 106, HDL 86, LDL 107 & reminded to take med every day!    DM w/ neuropathy> on Metform500Bid & Glipiz10-2Bid but ?what she is taking (didn't brink bottles); prev followed by DrEllison- Actos stopped due to edema; won't take additional meds due to cost; refuses insulin shots; she claims that BS higher when her knee is hurting; Labs 4/16 showed BS=175, A1c=9.4;  Labs 4/17 showed BS=268, A1c=10.6;  I confirmed w/ [facility] ([name]) that pt is NOT filling meds regularly (Metform= 04/04/15 for 30d supply & 01/20/15 prior to that;  Glipizide= 02/26/15 for 30d supply & 12/11/14 prior to that);  We will again try to refer to Endocrine (she has refused mult times).    GI- HH, colon polyps, s/p GB> stable she says, not on regular  PPI dosing, last colon was 2003 by DrStark w/ 57m53molyp & divertics; denies abd pain, n/v, d/c, blood seen...    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     DJD, LBP, right shoulder pain> on Neurontin100Tid; Ortho evals from DrNorris/ [facility]- prev knee surg for torn cartilage, back discomfort treated w/ Tramadol & Tylenol...    Osteoporosis, Vit D defic> Vit D  level 2013 = 30 and she was asked to incr her OTC Vit D supplement to 2000u daily; needs f/u BMD but she wants to wait...    Memory Loss> Hx 2 sm infarcts on prev MRI, on Xarelto from Cards; she declines Aricept etc...    Anxiety> mod stress in the family, she declines anxiolytic meds...    B12 defic> on OTC oral B12 supplement 1000u daily...    Medication non-compliance>  This is well documented at each & every office visit... EXAM shows Afeb, VSS, O2sat=98% on RA;  HEENT- dental problems, mallampati1;  Chest- clear w/o w/r/r;  Heart- Irreg Afib, gr1-2/6 DEM w/o r/g heard;  Abd- soft, nontender, neg;  Ext- VI, w/o c/c/e;  Neuro- intact w/o focal neuro deficits...  LABS 04/23/15>  FLP- not at goals, not taking meds regularly;  Chems- ok x BS=268, A1c=10.6;  CBC- wnl;  Thyroid=3.51;  Reminded to take all meds every day! IMP/PLAN>>  Isatou is still NOT taking her meds regularly- asked to fill all Rx every month & take all meds as prescribed every day!  We will attempt to get her to see DrGherghe at [facility] (she once again refused...   ~  October 26, 2015:  62moROV & Nyeli reports that she is doing well, feeling good, & no new complaints or concerns;  She denies CP, palpit, edema; states her breathing is OK & denies cough, sput, SOB; it is apparent that she is still NOT exercising at all... we reviewed the following  medical problems during today's office visit >>     BP & cardiac controlled w/ Aten50, DIYMEBRA309 Lasix40, Xarelto15; BP=124/82 today & she remains asymptomatic...    Chol treated w/ diet + Lip40 but compliance is questioned; she did not bring pill bottles or med list to todays OV; [place]/17 showed TChol 216, TG 115, HDL 80, LDL 113 & reminded to take med every day...    DM treated w/ Metform + Glipizide but she doesn't take what is prescribed- admits to taking Metform500-2Qam & Glipiz10-2Qam but no PM doses; Labs today showed     GI/GU- stable, she wears depends & take Ditropan due to her incont but this system is working satis for her...    She has diffuse DJD, LBP, osteoporosis etc; on Neurontin, Tramadol, Tylenol prn... EXAM shows Afeb, VSS, O2sat=97% on RA;  HEENT- dental problems, mallampati1;  Chest- clear w/o w/r/r;  Heart- Irreg Afib, gr1-2/6 DEM w/o r/g heard;  Abd- soft, nontender, neg;  Ext- VI, w/o c/c/e;  Neuro- intact w/o focal neuro deficits...  LABS 10/26/15>  Chems- ok x BS=296, A1c=10.6 IMP/PLAN>  BSanylacontinues to treat herself by taking the meds as she wants & not as prescribed; she has also tied my hands as she will not take any of the newed (more expensive) DM meds and wil not agree to insulin rx; finally I have tried to refer her to endocrine/ DM specialist but she has repeatedly refused to go; we reviewed low carb/ DM diet & rec to take the meds as follows- Metform500- 2Qam & 1Qpm plus the Glipizide10-2Qam & 1Qpm as well... She has again declined my offer to send her to a DM specialist for their review... Note: >50% of this 25 min appt was spent in counseling & coordination of care.           Problem List:     HYPERTENSION >> VALVULAR HEART DISEASE >> ATRIAL FIBRILLATION - stable on Rx w/ XARELTO & rate  control strategy. ~  4/11:  she reports that she stopped her Lanoxin 0.125 on her own & doesn't want to restart. ~  8/11:  f/u DrTaylor & stable on Coumadin w/ rate  control strategy, no changes made. ~  11/11:  Hosp by Neuro w/ TIA> 2DEcho showed mild LVH, diffuse HK w/ EF= 45%, thick pos MV leaflet w/ restrict motion & modMR, dilated LA&RA, severeTR & PAsys=49, can't r/o PFO... ~  8/12:  her BP=116/84 and she feels OK- denies HA, fatigue, visual changes, CP, palipit, dizziness, syncope, dyspnea, edema, etc... ~  12/12:  BP= 126/88 and she remains asymptomatic as above... ~  1/13:  2DEcho showed mild LVH w/ EF= 40-45% & diffuse HK, Diastolic dysfunction, modMR, severe LAE, modTR, Pasys=45 ~  She was switched from Coumadin to [facility] 1/13 due to the renal infarcts & difficulty maintaining INR. ~  CXR 1/13 showed marked cardiomegaly, pulm vasc congestion, NAD... ~  4/13:  BP= 128/60 and she is feeling better she says;  BUN=15, Creat=0.8; She had f/u DrTaylor> noted to be doing well w/o symptoms- continue same meds & low Na. ~  EKG 4/13 showed AFib, rate 77, right ward axis & NSSTTWA... ~  8/13:  BP= 110/82 and she denies current CP, palpit, SOB, edema... ~  4/14: on Aten50Bid; BP= 130/84 & she denies CP/ SOB/ edema, notes occas palpit. ~  5/14:  Adm by Cards> NSTEMI, Cath w/ LAD minor irreg only, LCirc w/ ostial 40-50%, mid RCA 95%, EF 55%. PCI: Vision BMS to the mid RCA. There was moderate MR on left ventriculogram;  ~  CXR 5/14 showed cardiomeg, underlying COPD w/ apical scarring, DJD in spine, NAD... ~  EKG 6/14 showed AFib, rate72, NSSTTWA...  ~  2DEcho 6/14 showed norm LV size & funct w/ EF=55%, mild MR, severe RA&LA dil, modTR,  ~  Adm 1/15 & 3/15 w/ CP, AFib w/ RVR, had NSTEMI 3/15 from demand ischemia; cath w/ patent coronaries, EF=50%; meds adjusted w/ stop ASA & decr Xarelto to 55m due to severe epistaxis... ~  CXR 3/15 showed cardiomegaly, hyperinflated lungs, biapical pleural thickening, NAD... ~  EKG 3/15 showed AFib, rate74, NSSTTWA... ~  4/15: on Aten50Bid, Diltiazem120, & Lasix20; also taking Xarelto15; BP= 110/64 & she denies CP/ SOB/ edema,  notes occas palpit... ~  10/15:  on Aten50Bid, Diltiazem120,  & Lasix20-2Qam, Xarelto15; BP= 110/64 & she claims asymptomatic... ~  4/16: on Aten50, Cardizem120, Lasix20-2/d, Xarelto15; she reports doing well- no CP etc; BP= 110/70... ~  CXR 4/16 showed mod cardiomeg, hyperinflation, sl blunt angles c/w pleural thickening, DJD Tspine, osteopenia, NAD.  HYPERCHOLESTEROLEMIA - on diet + SIMVASTATIN 417md... ~  FL[place]007 showed TChol 160, TG 143, HDL 55, LDL 76 ~  FLP 7/09 showed TChol 165, TG 129, HDL 60, LDL 80 ~  FLP 10/10 showed TChol 175, TG 68, HDL 80, LDL 82 ~  FLP 4/11 showed TChol 207, TG 104, HDL 84, LDL 111... rec better diet, continue same med. ~  FLP 11/11 on Simva40 showed TChol 180, TG 71, HDL 67, LDL 99 ~  FLP 8/12 on Simva40 showed TChol 196, TG 96, HDL 89, LDL 88 ~  FLP 4/13 on Simva40 showed TChol 211, TG 77, HDL 85, LDL 112... rec better diet, same med. ~  FLP 10/14 on Lip40 showed TChol 138, TG 63, HDL 65, LDL 61  ~  FLP 10/15 on Lip40 showed  TChol 127, TG 54, HDL 69, LDL 47 ~  FLP 4/16  on Lip40 showed TChol 214, TG 106, HDL 86, LDL 107   TYPE 2 DIABETES MELLITUS, Uncontrolled, w/ Neuropathy >>  ~  on METFORMIN 533m- 2tabsBid, GLIPZIDE 139mid (not taking Januvia due to $$)... ~  labs 1/09 showed BS= 148, HgA1c= 7.1...Marland KitchenMarland Kitchenontinue meds + better diet... ~  labs 7/09 showed BS= 168, HgA1c= 7.6...Marland KitchenMarland Kitchenncr Metform 2Bid, contin Glip 10Bid & Januv... ~  she stopped the Januvia due to cost... ~  labs 4/10 showed BS= 185, Aic= 8.0...Marland KitchenMarland Kitcheneminder to take meds regularly. ~  labs 10/10 on Metform2Bid+Glip10Bid showed BS= 124, A1c= 6.7 ~  labs 4/11 showed BS= 162, A1c= 6.8...Marland Kitchenrec> better diet, take meds regularly. ~  labs 10/11 showed BS= 322, A1c= 8.3...Marland KitchenMarland Kitchenharm confirms not taking meds regularly!!! Discussed w/ pt... ~  Labs 4/12 showed BS= 91, A1c= 7.9...Marland KitchenMarland Kitchenec> keep same meds, take regularly, better low carb diet... ~  Labs 8/12 on Metform2Bid+Glip10Bid showed BS= 114, A1c= 7.3...Marland KitchenMarland Kitchenontinue  same... ~  Labs in hosp 1/13 showed BS~ 153-209 & A1c=7.4... NOTE: she has refused other meds due to $$$ ~  Labs 4/13 on Metform2Bid+Glipiz10Bid showed BS= 208, A1c= 8.6... She is referred to Endocrinology ~  She saw DrEllison but refused Actos, refused Gliptins, etc... ~  Labs 8/13 on Metform2Bid+Glipiz10Bid showed BS= 163, A1c= 7.5...Marland KitchenMarland Kitchenontinue same + better diet... ~  Labs 10/14 on Metform2Bid+Glipiz10Bid showed BS= 181, A1c= 8.8...Marland KitchenMarland Kitchenec better diet & incr Glipiz10-2Bid... ~  Labs 4/15 on Metform500-2Bid & Glipizide10-2Bid showed BS= 181, A1c= 9.3; she refuses additional meds; she cut back both meds to just Qam due to "side effects"... ~  Labs 10/15 on Metform500-2Qam & Glipiz10-2Qam showed BS= 172, A1c= 8.4 and REC to add one of each at dinner in the PM...  ~  Labs 4/16 on Metform500-2Qam & Glipiz10-2Qam showed BS=175, A1c=9.4 (compliance is ?? She has been reluctant to follow my recs for increased meds) => refer to [facility] for DM management.  HIATAL HERNIA (ICD-553.3) - last EGD by DrStark was 3/03 and showed a HH, reflux...  COLONIC POLYPS (ICD-211.3) - last colonoscopy 3/03 by DrStark showed divertics and 4m73molyp (no path avail).  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 pt reports that there is nothing they can do to help... ~  she has had several UTI's... then perineal rash & saw GYN= neg PAP & Rx yeast infection- resolved. ~  She will f/u w/ Urology for persistant symptoms, seen by DrEskridge w/ trial Myrbetriq but not much benefit per pt... ~  7/15: referred to DrEskridge by Ortho after MRI spine showed ?bilat hydro; reviewed by Urology & they disagreed- no hydro but she has hx renal infarct 1/13, bilat extrarenal pelvis, & left peri-pelvic cysts, small bladder capacity, known cystocele...  DEGENERATIVE JOINT DISEASE (ICD-715.90) - she needs right TKR but wants to put this off as  long as poss... ~  4/12:  Ortho eval by DrNorris, hx prev knee arthroscopies, given Cortisone shot, & he plans ?Synvisc series... ~  9/12:  outpt knee surg to repair torn cartilage per DrNorris; preop clearance by DrTaylor & [facility]...  BACK PAIN, LUMBAR (ICD-724.2) - XRays showed scoliosis, facet degen arthritis, & osteopenia...  she has used ROBAXIN Prn... ~  4/12:  She reports eval from DrNorris w/ "arthritis all down my backbone" & he rec epid steroid injection, but she reports improved on NEURONTIN 100m52md... ~  4/13:  She reports that back  pain has improved but she still has some leg pain but notes that "it's tolerable"...  SHOULDER PAIN, RIGHT (ICD-719.41) - prev eval & Rx by DrRendall... s/p right shoulder surg 3/09... XRays showed calcium deposit, rotator cuff prob, & intol to Tramadol... ~  She tells me she wants to f/u w/ DrWhitfield for Ortho...  OSTEOPOROSIS (ICD-733.00) ~  labs 10/10 w/ Vit D level = 32... rec> start Vit D OTC 1000 u daily. ~  Labs 4/13 showed Vit D level = 30... rec to incr to 2000u daily...  MEMORY LOSS (ICD-780.93) - concern for her memory expressed 7/09 OV- tried Aricept but she stopped it- "no different". TIA/ INFARCT - she was Hosp 11/11 by DrWillis w/ left sided weakness, ?left facial droop, & MRI showed 2 sm infarcts on right (frontal & ?pontine);  MRA was neg;  CDopplers were neg;  Deficits all cleared rapidly;  2DEcho was abn- see above;  She was continued on her Coumadin Rx- no changes made... ~  4/13:  She was offered memory medications but she declined...  ANXIETY (ICD-300.00) - stress in family... 2 y/o granddau w/ seizures...  VITAMIN B12 DEFICIENCY >> on OTC Vit B-12 tabs orally> 1051mg/d... ~  Labs 7/11 showed Vit V12 level = 135... Started on Vit B12 supplement w/ 10037m/d... ~  Labs 4/13 showed Vit B12 level = 905... Ok top decr to 1/2 tab daily...   Past Surgical History:  Procedure Laterality Date  . cataract surgery/both  eyes     01/2012 left eye---02/2012 right eye  . CHOLECYSTECTOMY    . LEFT HEART CATHETERIZATION WITH CORONARY ANGIOGRAM N/A 05/28/2012   Procedure: LEFT HEART CATHETERIZATION WITH CORONARY ANGIOGRAM;  Surgeon: MiSherren MochaMD;  [facility];  Service: Cardiovascular;  Laterality: N/A;  . LEFT HEART CATHETERIZATION WITH CORONARY ANGIOGRAM N/A 03/14/2013   Procedure: LEFT HEART CATHETERIZATION WITH CORONARY ANGIOGRAM;  Surgeon: HeSinclair GroomsMD;  [facility];  Service: Cardiovascular;  Laterality: N/A;  . PERCUTANEOUS CORONARY STENT INTERVENTION (PCI-S)  05/28/2012   Procedure: PERCUTANEOUS CORONARY STENT INTERVENTION (PCI-S);  Surgeon: MiSherren MochaMD;  [facility];  Service: Cardiovascular;;  . rt.leg surgery  11/2010    Outpatient Encounter Prescriptions as of 10/26/2015  Medication Sig Dispense Refill  . acetaminophen (TYLENOL) 500 MG tablet Take 500 mg by mouth every 6 (six) hours as needed for mild pain.    . Marland Kitchentenolol (TENORMIN) 50 MG tablet TAKE ONE TABLET BY MOUTH ONCE DAILY 90 tablet 3  . atorvastatin (LIPITOR) 40 MG tablet TAKE ONE TABLET BY MOUTH ONCE DAILY AT 6PM 30 tablet 6  . Blood Glucose Monitoring Suppl (ACCU-CHEK AVIVA PLUS) w/Device KIT Test blood sugar once daily 1 kit 0  . diltiazem (CARTIA XT) 120 MG 24 hr capsule Take 1 capsule (120 mg total) by mouth daily. 30 capsule 11  . fluconazole (DIFLUCAN) 150 MG tablet Take 1 tablet now.  May repeat dose in 3 days if persistent vaginal itching. 2 tablet 0  . furosemide (LASIX) 20 MG tablet TAKE TWO TABLETS BY MOUTH ONCE DAILY 60 tablet 7  . gabapentin (NEURONTIN) 100 MG capsule TAKE ONE CAPSULE BY MOUTH THREE TIMES DAILY 90 capsule 2  . glipiZIDE (GLUCOTROL) 10 MG tablet TAKE TWO TABLETS BY MOUTH TWICE DAILY 120 tablet 2  . glucose blood (ACCU-CHEK AVIVA PLUS) test strip TEST BLOOD SUGAR ONCE DAILY 100 each 1  . glucose blood test strip Use as instructed 100 each 0  . MAGNESIUM-OXIDE  400 (241.3  Mg) MG tablet TAKE ONE TABLET BY MOUTH TWICE DAILY 60 tablet 4  . metFORMIN (GLUCOPHAGE) 500 MG tablet TAKE TWO TABLETS BY MOUTH TWICE DAILY WITH MEALS 120 tablet 3  . nitroGLYCERIN (NITROSTAT) 0.4 MG SL tablet Place 1 tablet (0.4 mg total) under the tongue every 5 (five) minutes as needed for chest pain (up to 3 doses). 25 tablet 3  . oxybutynin (DITROPAN) 5 MG tablet Take 1 tablet (5 mg total) by mouth 2 (two) times daily. 60 tablet prn  . Rivaroxaban (XARELTO) 15 MG TABS tablet Take 1 tablet (15 mg total) by mouth daily with supper. 30 tablet 11  . traMADol (ULTRAM) 50 MG tablet Take 1/2-1 tablet by mouth three times a day as needed for pain 90 tablet 0  . vitamin B-12 (CYANOCOBALAMIN) 1000 MCG tablet Take 1,000 mcg by mouth every morning.      No facility-administered encounter medications on file as of 10/26/2015.      Allergies  Allergen Reactions  . Peanut-Containing Drug Products Anaphylaxis and Swelling  . Azithromycin Other (See Comments)    REACTION: pt states INTOL to ZPak  . Flomax [Tamsulosin Hcl] Swelling  . Pioglitazone Other (See Comments)    edema  . Pregabalin Other (See Comments)    REACTION: pt states INTOL to Lyrica  . Sulfonamide Derivatives Swelling    Review of Systems        See HPI - all other systems neg except as noted... The patient complains of weight loss, chest pain, dyspnea on exertion, and muscle weakness.  The patient denies anorexia, fever, weight gain, vision loss, decreased hearing, hoarseness, syncope, peripheral edema, prolonged cough, headaches, hemoptysis, abdominal pain, melena, hematochezia, severe indigestion/heartburn, hematuria, incontinence, suspicious skin lesions, transient blindness, difficulty walking, depression, unusual weight change, abnormal bleeding, enlarged lymph nodes, and angioedema.     Objective:   Physical Exam      WD, WN, 82 y/o WF in NAD...  GENERAL:  Alert & oriented; pleasant & cooperative... HEENT:  [place]/AT,  EOM-wnl, PERRLA, EACs-clear, TMs-wnl, NOSE-clear, THROAT-clear & wnl. NECK:  Supple w/ fairROM; no JVD; normal carotid impulses w/o bruits; no thyromegaly or nodules palpated; no lymphadenopathy. CHEST:  Clear to P & A; without wheezes/ rales/ or rhonchi; +tender left 11th-12th ribs & costal margin. HEART:  Iregular Rhythm= AFib; Gr1-2 SEM without rubs or gallops heard... ABDOMEN:  Soft & nontender; normal bowel sounds; no organomegaly or masses detected. EXT: without deformities, mild arthritic changes; no varicose veins/ venous insuffic/ or edema. NEURO:  CN's intact;  no focal neuro deficits found... DERM:  No lesions noted; no rash etc...  RADIOLOGY DATA:  Reviewed in the EPIC EMR & discussed w/ the patient...  LABORATORY DATA:  Reviewed in the EPIC EMR & discussed w/ the patient...   Assessment & Plan:    10/26/15>  Horace continues to treat herself by taking the meds as she wants & not as prescribed; she has also tied my hands as she will not take any of the newed (more expensive) DM meds and wil not agree to insulin rx; finally I have tried to refer her to endocrine/ DM specialist but she has repeatedly refused to go; we reviewed low carb/ DM diet & rec to take the meds as follows- Metform500- 2Qam & 1Qpm plus the Glipizide10-2Qam & 1Qpm as well... She has again declined my offer to send her to a DM specialist for their review.   HBP>  Stable on BBlocker, CCB, Lasix20; continue  same... SHE DOES NOT KNOW HER MEDS & DID NOT BRING BOTTLES.  CAD>  Hosp 5/14 for NSTEMI=> stent to RCA; Hosp 3/15 w/ NSTEMI & cath showed widely patent coronaries & EF=50%...  AFIB>  She had abn 2DEcho in Hosp 11/11 & 1/13- reviewed by Cards- she sees DrTaylor et al & on XARELTO15 due to epistaxis...  CHOL>  FLP looked fair on the Simva40 + diet rx; switched to [place] during the 5/14 [facility] for NSTEMI & FLP looks good when she takes it every day!...  DM>  A1c is 10.6 now on Metform500-2Qam & Glipiz10-2Qam & she  refuses additional meds + she is NOT taking these meds everyday!  Asked to take all meds as prescribed regularly & once again asked to see ENDOCRINE for DM management.  RENAL INFARCTION>  Adm 1/13 w/ renal infarcts felt due to micro-emboli & inadeq INR ==> changed to XARELTO... URINARY INCONTINENCE>  Followed by Eli Hose now Eskridge & she feels that there is nothing they can do to help her; wears pads; offered second opinion & she will consider it...  ORTHO>  Followed by DrNorris/ [facility] & she is s/p cortisone shots in right knee & ?Synvisc series; she had arthroscopic surg for torn cartilage 9/12 she says & she doesn't want TKR.  STROKE>  She has had a CVA w/ hosp by Neuro 11/11 [facility] records all reviewed);  She was continued on her Coumadin per DrWillis & followed in [facility]=> changed to [place] 2013 as noted;  Stable w/o recurrent cerebral ischemic symptoms...  Other medical problems as noted... She will continue the Calcium, MVI, Vit D, & Vit B12 regimen...   Patient's Medications  New Prescriptions   No medications on file  Previous Medications   ACETAMINOPHEN (TYLENOL) 500 MG TABLET    Take 500 mg by mouth every 6 (six) hours as needed for mild pain.   ATENOLOL (TENORMIN) 50 MG TABLET    TAKE ONE TABLET BY MOUTH ONCE DAILY   ATORVASTATIN (LIPITOR) 40 MG TABLET    TAKE ONE TABLET BY MOUTH ONCE DAILY AT 6PM   BLOOD GLUCOSE MONITORING SUPPL (ACCU-CHEK AVIVA PLUS) W/DEVICE KIT    Test blood sugar once daily   DILTIAZEM (CARTIA XT) 120 MG 24 HR CAPSULE    Take 1 capsule (120 mg total) by mouth daily.   FLUCONAZOLE (DIFLUCAN) 150 MG TABLET    Take 1 tablet now.  May repeat dose in 3 days if persistent vaginal itching.   FUROSEMIDE (LASIX) 20 MG TABLET    TAKE TWO TABLETS BY MOUTH ONCE DAILY   GABAPENTIN (NEURONTIN) 100 MG CAPSULE    TAKE ONE CAPSULE BY MOUTH THREE TIMES DAILY   GLIPIZIDE (GLUCOTROL) 10 MG TABLET    TAKE TWO TABLETS BY MOUTH TWICE DAILY   GLUCOSE BLOOD (ACCU-CHEK AVIVA  PLUS) TEST STRIP    TEST BLOOD SUGAR ONCE DAILY   GLUCOSE BLOOD TEST STRIP    Use as instructed   MAGNESIUM-OXIDE 400 (241.3 MG) MG TABLET    TAKE ONE TABLET BY MOUTH TWICE DAILY   METFORMIN (GLUCOPHAGE) 500 MG TABLET    TAKE TWO TABLETS BY MOUTH TWICE DAILY WITH MEALS   NITROGLYCERIN (NITROSTAT) 0.4 MG SL TABLET    Place 1 tablet (0.4 mg total) under the tongue every 5 (five) minutes as needed for chest pain (up to 3 doses).   OXYBUTYNIN (DITROPAN) 5 MG TABLET    Take 1 tablet (5 mg total) by mouth 2 (two) times daily.  RIVAROXABAN (XARELTO) 15 MG TABS TABLET    Take 1 tablet (15 mg total) by mouth daily with supper.   TRAMADOL (ULTRAM) 50 MG TABLET    Take 1/2-1 tablet by mouth three times a day as needed for pain   VITAMIN B-12 (CYANOCOBALAMIN) 1000 MCG TABLET    Take 1,000 mcg by mouth every morning.   Modified Medications   No medications on file  Discontinued Medications   No medications on file

## 2015-10-26 NOTE — Patient Instructions (Signed)
Today we updated your med list in our EPIC system...    Continue your current medications the same...  Today we rechecked your diabetic labs...    We will contact you w/ the results when available...   BE sure to take the METFORMIN 500mg  2 tabs twice daily (breakfast & dinner)   And the GLIPIZIDE (GLUCOTROL) 10mg  tabs- 2 tabs twice daily...  Remember- no sugars no sweets, low carb diet...    And drink plenty of water...  Call for any questions...  We gave you the 2017 Flu vaccine today...  Let's plan a follow up visit in 61mo, sooner if needed for problems.Marland KitchenMarland Kitchen

## 2015-11-30 ENCOUNTER — Telehealth: Payer: Self-pay | Admitting: Pulmonary Disease

## 2015-11-30 MED ORDER — CIPROFLOXACIN HCL 250 MG PO TABS: 250.0000 mg | ORAL_TABLET | Freq: Two times a day (BID) | ORAL | 0 refills | Status: DC

## 2015-11-30 NOTE — Telephone Encounter (Signed)
Called and spoke with pt and she is aware of meds being sent to her pharmacy.  nothing further is needed.

## 2015-11-30 NOTE — Telephone Encounter (Signed)
Spoke with pt, thinks she has a UTI- c/o increased urination, painful urination, fullness in abdomen X3-4 days.  Pt has taken Marlin which has not helped with s/s.  Requesting further recs.  Uses [facility] on [name].    SN please advise on recs.  Thanks!

## 2015-12-01 ENCOUNTER — Telehealth: Payer: Self-pay

## 2015-12-01 NOTE — Telephone Encounter (Signed)
Per SN---  Ok to fill this medication.  Pharmacy is aware.

## 2015-12-01 NOTE — Telephone Encounter (Signed)
C[facility]on called about Cipro counteracting with her Glipizide. Spoke to Leigh but SN was in a room will forward to [place] to inform us of his decision.  They want to find out what he want to do.   Pharmacy can be reached at [phone]

## 2015-12-31 ENCOUNTER — Other Ambulatory Visit: Payer: Self-pay | Admitting: Internal Medicine

## 2016-01-06 ENCOUNTER — Other Ambulatory Visit: Payer: Self-pay | Admitting: *Deleted

## 2016-01-06 DIAGNOSIS — I4821 Permanent atrial fibrillation: Secondary | ICD-10-CM

## 2016-01-06 MED ORDER — RIVAROXABAN 15 MG PO TABS: 15.0000 mg | ORAL_TABLET | Freq: Every day | ORAL | 0 refills | Status: DC

## 2016-01-06 NOTE — Telephone Encounter (Signed)
left one bottle of xarelto up front with cards and informed pt we can not longer supply her a years worth of samples, she will have to look into alternatives & help resources.

## 2016-01-08 ENCOUNTER — Telehealth: Payer: Self-pay

## 2016-01-08 NOTE — Telephone Encounter (Signed)
spoke with patient about her xarelto sample she picked up yesterday 01/07/16. she was given 20mg  instead of 15mg  which she is prescribed. I am leaving her 2 bottles of xarelto 15mg  up front for pick up and patient is bringing her sample of 20mg  back.

## 2016-02-20 ENCOUNTER — Other Ambulatory Visit: Payer: Self-pay | Admitting: Internal Medicine

## 2016-02-20 DIAGNOSIS — I4821 Permanent atrial fibrillation: Secondary | ICD-10-CM

## 2016-02-22 ENCOUNTER — Other Ambulatory Visit: Payer: Self-pay | Admitting: *Deleted

## 2016-02-22 MED ORDER — DILTIAZEM HCL ER COATED BEADS 120 MG PO CP24: 120.0000 mg | ORAL_CAPSULE | Freq: Every day | ORAL | 2 refills | Status: DC

## 2016-02-23 ENCOUNTER — Telehealth: Payer: Self-pay | Admitting: Pulmonary Disease

## 2016-02-23 MED ORDER — CIPROFLOXACIN HCL 250 MG PO TABS: 250.0000 mg | ORAL_TABLET | Freq: Two times a day (BID) | ORAL | 0 refills | Status: DC

## 2016-02-23 NOTE — Telephone Encounter (Signed)
Per SN---  cipro 250 mg  #14  1 po bid   Ok to take with her other meds---will make sure that [facility] fills this med for her.    Called and spoke with pt and she is aware. Nothing further is needed.

## 2016-02-23 NOTE — Telephone Encounter (Signed)
Pt requesting something to help with UTI- pt c/o increased urination, strong urge to frequently urinate, burning with urination X4 days.  Pt has not taken anything to help with symptoms.   Pt uses [facility] on [address]  SN please advise on recs.  Thanks!

## 2016-02-24 ENCOUNTER — Telehealth: Payer: Self-pay | Admitting: Internal Medicine

## 2016-02-24 NOTE — Telephone Encounter (Signed)
Patient is calling regards to Xarelto prescription. She would like some samples, patient states that she could not get prescription filled because it was too expensive. Would like to discuss, thanks.

## 2016-02-25 NOTE — Telephone Encounter (Signed)
Called, pt unavailable. Line busy.

## 2016-02-25 NOTE — Telephone Encounter (Signed)
Pt was given 2 bottles of Xarelto 15 mg tablet, 2 week supply. LOT# [id number]  Exp: 1/20

## 2016-02-25 NOTE — Telephone Encounter (Signed)
Called, spoke with pt. Pt informed she needs samples of Xarelto because she can't afford it. Informed of PAN (to apply for assistance). Informed I will check to see if samples are available. Pt stated "I will come up there with my shotgun and find the samples". Pt also stated her son is around the area and will be stopping by to pick up the samples. Informed I will call back after checking with refill dept.

## 2016-02-25 NOTE — Telephone Encounter (Signed)
Follow Up Call   1.  What medication and dosage are you requesting samples for? Xarelto   2.  Are you currently out of this medication? Yes

## 2016-02-29 ENCOUNTER — Emergency Department (HOSPITAL_COMMUNITY): Payer: Medicare Other

## 2016-02-29 ENCOUNTER — Telehealth: Payer: Self-pay | Admitting: Internal Medicine

## 2016-02-29 ENCOUNTER — Observation Stay (HOSPITAL_COMMUNITY)
Admission: EM | Admit: 2016-02-29 | Discharge: 2016-03-02 | Disposition: A | Discharge status: Home / Self Care | Payer: Medicare Other | Attending: Family Medicine | Admitting: Family Medicine

## 2016-02-29 ENCOUNTER — Encounter (HOSPITAL_COMMUNITY): Payer: Self-pay | Admitting: Emergency Medicine

## 2016-02-29 DIAGNOSIS — E1140 Type 2 diabetes mellitus with diabetic neuropathy, unspecified: Secondary | ICD-10-CM | POA: Insufficient documentation

## 2016-02-29 DIAGNOSIS — R079 Chest pain, unspecified: Secondary | ICD-10-CM

## 2016-02-29 DIAGNOSIS — I482 Chronic atrial fibrillation: Secondary | ICD-10-CM | POA: Insufficient documentation

## 2016-02-29 DIAGNOSIS — B9620 Unspecified Escherichia coli [E. coli] as the cause of diseases classified elsewhere: Secondary | ICD-10-CM | POA: Diagnosis not present

## 2016-02-29 DIAGNOSIS — I272 Pulmonary hypertension, unspecified: Secondary | ICD-10-CM | POA: Diagnosis not present

## 2016-02-29 DIAGNOSIS — Z7901 Long term (current) use of anticoagulants: Secondary | ICD-10-CM | POA: Insufficient documentation

## 2016-02-29 DIAGNOSIS — I110 Hypertensive heart disease with heart failure: Secondary | ICD-10-CM | POA: Diagnosis not present

## 2016-02-29 DIAGNOSIS — I5042 Chronic combined systolic (congestive) and diastolic (congestive) heart failure: Secondary | ICD-10-CM | POA: Diagnosis not present

## 2016-02-29 DIAGNOSIS — R109 Unspecified abdominal pain: Secondary | ICD-10-CM

## 2016-02-29 DIAGNOSIS — Z7984 Long term (current) use of oral hypoglycemic drugs: Secondary | ICD-10-CM | POA: Diagnosis not present

## 2016-02-29 DIAGNOSIS — I2510 Atherosclerotic heart disease of native coronary artery without angina pectoris: Secondary | ICD-10-CM | POA: Diagnosis not present

## 2016-02-29 DIAGNOSIS — N12 Tubulo-interstitial nephritis, not specified as acute or chronic: Principal | ICD-10-CM | POA: Insufficient documentation

## 2016-02-29 DIAGNOSIS — I4821 Permanent atrial fibrillation: Secondary | ICD-10-CM | POA: Diagnosis present

## 2016-02-29 DIAGNOSIS — N390 Urinary tract infection, site not specified: Secondary | ICD-10-CM | POA: Diagnosis not present

## 2016-02-29 DIAGNOSIS — N3946 Mixed incontinence: Secondary | ICD-10-CM | POA: Diagnosis not present

## 2016-02-29 DIAGNOSIS — R0789 Other chest pain: Secondary | ICD-10-CM | POA: Insufficient documentation

## 2016-02-29 DIAGNOSIS — I10 Essential (primary) hypertension: Secondary | ICD-10-CM | POA: Diagnosis not present

## 2016-02-29 DIAGNOSIS — I252 Old myocardial infarction: Secondary | ICD-10-CM | POA: Insufficient documentation

## 2016-02-29 DIAGNOSIS — Z955 Presence of coronary angioplasty implant and graft: Secondary | ICD-10-CM | POA: Insufficient documentation

## 2016-02-29 DIAGNOSIS — E1165 Type 2 diabetes mellitus with hyperglycemia: Secondary | ICD-10-CM | POA: Diagnosis not present

## 2016-02-29 DIAGNOSIS — E7800 Pure hypercholesterolemia, unspecified: Secondary | ICD-10-CM | POA: Insufficient documentation

## 2016-02-29 DIAGNOSIS — Z8673 Personal history of transient ischemic attack (TIA), and cerebral infarction without residual deficits: Secondary | ICD-10-CM | POA: Insufficient documentation

## 2016-02-29 DIAGNOSIS — Z79899 Other long term (current) drug therapy: Secondary | ICD-10-CM | POA: Insufficient documentation

## 2016-02-29 LAB — CBC
HEMATOCRIT: 39.5 % (ref 36.0–46.0)
HEMOGLOBIN: 13.7 g/dL (ref 12.0–15.0)
MCH: 31.4 pg (ref 26.0–34.0)
MCHC: 34.7 g/dL (ref 30.0–36.0)
MCV: 90.6 fL (ref 78.0–100.0)
Platelets: 171 10*3/uL (ref 150–400)
RBC: 4.36 MIL/uL (ref 3.87–5.11)
RDW: 12.5 % (ref 11.5–15.5)
WBC: 4.8 10*3/uL (ref 4.0–10.5)

## 2016-02-29 LAB — URINALYSIS, ROUTINE W REFLEX MICROSCOPIC
BILIRUBIN URINE: NEGATIVE
Ketones, ur: 20 mg/dL — AB
NITRITE: POSITIVE — AB
Protein, ur: NEGATIVE mg/dL
SPECIFIC GRAVITY, URINE: 1.011 (ref 1.005–1.030)
pH: 5 (ref 5.0–8.0)

## 2016-02-29 LAB — TROPONIN I

## 2016-02-29 LAB — I-STAT TROPONIN, ED: Troponin i, poc: 0 ng/mL (ref 0.00–0.08)

## 2016-02-29 LAB — COMPREHENSIVE METABOLIC PANEL
ALBUMIN: 4.1 g/dL (ref 3.5–5.0)
ALK PHOS: 74 U/L (ref 38–126)
ALT: 14 U/L (ref 14–54)
ANION GAP: 9 (ref 5–15)
AST: 20 U/L (ref 15–41)
BUN: 14 mg/dL (ref 6–20)
CALCIUM: 9.4 mg/dL (ref 8.9–10.3)
CO2: 28 mmol/L (ref 22–32)
Chloride: 99 mmol/L — ABNORMAL LOW (ref 101–111)
Creatinine, Ser: 0.79 mg/dL (ref 0.44–1.00)
GLUCOSE: 288 mg/dL — AB (ref 65–99)
POTASSIUM: 3.7 mmol/L (ref 3.5–5.1)
Sodium: 136 mmol/L (ref 135–145)
TOTAL PROTEIN: 6.8 g/dL (ref 6.5–8.1)
Total Bilirubin: 1.6 mg/dL — ABNORMAL HIGH (ref 0.3–1.2)

## 2016-02-29 LAB — GLUCOSE, CAPILLARY
Glucose-Capillary: 201 mg/dL — ABNORMAL HIGH (ref 65–99)
Glucose-Capillary: 202 mg/dL — ABNORMAL HIGH (ref 65–99)

## 2016-02-29 LAB — LIPASE, BLOOD: Lipase: 34 U/L (ref 11–51)

## 2016-02-29 MED ORDER — ASPIRIN 81 MG PO CHEW
324.0000 mg | CHEWABLE_TABLET | Freq: Once | ORAL | Status: AC
  Administered 2016-02-29: 324 mg via ORAL
  Filled 2016-02-29: qty 4

## 2016-02-29 MED ORDER — GABAPENTIN 100 MG PO CAPS
100.0000 mg | ORAL_CAPSULE | Freq: Three times a day (TID) | ORAL | Status: DC
  Administered 2016-02-29 – 2016-03-02 (×6): 100 mg via ORAL
  Filled 2016-02-29 (×6): qty 1

## 2016-02-29 MED ORDER — LOSARTAN POTASSIUM 50 MG PO TABS
25.0000 mg | ORAL_TABLET | Freq: Every day | ORAL | Status: DC
  Administered 2016-02-29 – 2016-03-02 (×3): 25 mg via ORAL
  Filled 2016-02-29 (×3): qty 1

## 2016-02-29 MED ORDER — RIVAROXABAN 20 MG PO TABS: 20.0000 mg | ORAL_TABLET | Freq: Every day | ORAL | Status: DC

## 2016-02-29 MED ORDER — RIVAROXABAN 15 MG PO TABS
15.0000 mg | ORAL_TABLET | Freq: Every day | ORAL | Status: DC
  Administered 2016-02-29 – 2016-03-01 (×2): 15 mg via ORAL
  Filled 2016-02-29 (×2): qty 1

## 2016-02-29 MED ORDER — DEXTROSE 5 % IV SOLN
1.0000 g | Freq: Once | INTRAVENOUS | Status: AC
  Administered 2016-02-29: 1 g via INTRAVENOUS
  Filled 2016-02-29: qty 10

## 2016-02-29 MED ORDER — INSULIN ASPART 100 UNIT/ML ~~LOC~~ SOLN
0.0000 [IU] | Freq: Three times a day (TID) | SUBCUTANEOUS | Status: DC
  Administered 2016-02-29: 3 [IU] via SUBCUTANEOUS
  Administered 2016-03-01: 1 [IU] via SUBCUTANEOUS
  Administered 2016-03-01: 2 [IU] via SUBCUTANEOUS
  Administered 2016-03-01: 5 [IU] via SUBCUTANEOUS
  Administered 2016-03-02: 2 [IU] via SUBCUTANEOUS
  Administered 2016-03-02: 9 [IU] via SUBCUTANEOUS

## 2016-02-29 MED ORDER — ATORVASTATIN CALCIUM 40 MG PO TABS
40.0000 mg | ORAL_TABLET | Freq: Every day | ORAL | Status: DC
  Administered 2016-02-29 – 2016-03-01 (×2): 40 mg via ORAL
  Filled 2016-02-29 (×2): qty 1

## 2016-02-29 MED ORDER — ACETAMINOPHEN 325 MG PO TABS: 650.0000 mg | ORAL_TABLET | Freq: Four times a day (QID) | ORAL | Status: DC | PRN

## 2016-02-29 MED ORDER — ACETAMINOPHEN 650 MG RE SUPP: 650.0000 mg | Freq: Four times a day (QID) | RECTAL | Status: DC | PRN

## 2016-02-29 MED ORDER — DILTIAZEM HCL ER COATED BEADS 120 MG PO CP24
120.0000 mg | ORAL_CAPSULE | Freq: Every day | ORAL | Status: DC
  Administered 2016-02-29 – 2016-03-02 (×3): 120 mg via ORAL
  Filled 2016-02-29 (×3): qty 1

## 2016-02-29 MED ORDER — ATENOLOL 50 MG PO TABS
50.0000 mg | ORAL_TABLET | Freq: Every day | ORAL | Status: DC
  Administered 2016-02-29 – 2016-03-02 (×3): 50 mg via ORAL
  Filled 2016-02-29 (×3): qty 1

## 2016-02-29 MED ORDER — ONDANSETRON HCL 4 MG PO TABS: 4.0000 mg | ORAL_TABLET | Freq: Four times a day (QID) | ORAL | Status: DC | PRN

## 2016-02-29 MED ORDER — ONDANSETRON HCL 4 MG/2ML IJ SOLN: 4.0000 mg | Freq: Four times a day (QID) | INTRAMUSCULAR | Status: DC | PRN

## 2016-02-29 MED ORDER — INSULIN ASPART 100 UNIT/ML ~~LOC~~ SOLN
0.0000 [IU] | Freq: Every day | SUBCUTANEOUS | Status: DC
  Administered 2016-02-29 – 2016-03-01 (×2): 2 [IU] via SUBCUTANEOUS

## 2016-02-29 MED ORDER — SODIUM CHLORIDE 0.9% FLUSH
3.0000 mL | Freq: Two times a day (BID) | INTRAVENOUS | Status: DC
  Administered 2016-02-29 – 2016-03-02 (×4): 3 mL via INTRAVENOUS

## 2016-02-29 MED ORDER — DEXTROSE 5 % IV SOLN
1.0000 g | INTRAVENOUS | Status: DC
  Administered 2016-03-01 – 2016-03-02 (×2): 1 g via INTRAVENOUS
  Filled 2016-02-29 (×2): qty 10

## 2016-02-29 NOTE — Telephone Encounter (Signed)
Pt calling back, wanting to know if she needs to go to the ER??? NO CHEST PAIN

## 2016-02-29 NOTE — ED Triage Notes (Signed)
Pt c/o intermittent stabbing epigastric pain x 3 days. No alleviating/aggravating factors. Also c/o palpitations, which are usual for her. Hx 2 stents placed in heart due to "90% blockage" of artery.

## 2016-02-29 NOTE — Telephone Encounter (Signed)
New Message    Pt thinks her stint is broken, she is having pain in stomach and a burning sensation, this started 3 days ago and it feels like she is being stung by bee

## 2016-02-29 NOTE — H&P (Signed)
History and Physical  Michelle Herrera [id number] DOB: June 28, 1930 DOA: 02/29/2016   PCP: Noralee Space, MD   Patient coming from: Home  Chief Complaint: chest pain  HPI:  Michelle Herrera is a 83 y.o. female with medical history of atrial fibrillation, hypertension, coronary artery disease with stent in the RCA 2014, diabetes mellitus presenting with chest pain of 2-3 days duration that's located on the left side of her chest below her breast and migrating to the epigastrium and right side of her chest. She denies any fevers, chills, shortness breath, coughing, nausea, vomiting, diarrhea, abdominal pain. There is no dizziness or syncope. The patient states that her chest pain usually last anywhere from 2-5 minutes, but was concerned because it was occurring and higher frequency in the past 24 hours. She states that her chest pain is worsened in the supine position.  In the emergency department, the patient was afebrile hemodynamically stable saturating well on room air. BMP and CBC were essentially unremarkable except for serum glucose of 288. Hepatic enzymes were unremarkable. Chest x-ray showed emphysema and stable cardiomegaly. EKG showed atrial fibrillation with nonspecific ST changes. Urinalysis showed TNTC WBC.  Assessment/Plan: Atypical chest pain in pt with CAD -Cycle troponins -cardiology consulted by EDP-will see pt 03/01/16 -personally reviewed EKG--atrial fibrillation with nonspecific ST changes  -Echocardiogram   Atrial fibrillation -Continue rivaroxaban--patient states that she has been out of her medication for approximately 3 days -Rate controlled -Continue diltiazem and atenolol  UTI -UA--TNTC WBC -Empiric ceftriaxone pending culture data  Hypertension -Continue atenolol, losartan, diltiazem   Diabetes mellitus type 2  -Hemoglobin A1c  -NovoLog sliding scale  -Holding metformin and glipizide   Chronic systolic and diastolic CHF -Euvolemic  clinically -Daily weights -05/28/2013 echo EF 50-55 percent, mod MR, PASP 47      Past Medical History:  Diagnosis Date  . AF (atrial fibrillation) ([place])    a. Chronic Xarelto  . Anxiety state, unspecified   . Benign neoplasm of colon   . CAD (coronary artery disease)    a. 05/2012 NSTEMI/Cath/PCI: LM nl, LAD min irregs, LCX min irregs, RI 40-50 ost, OM1 nl, RCA dom, 46m (4.0x12 Vision BMS), PD/PL nl, EF 55%, at least mod MR. b. NSTEMI 03/2013 - cath with widely patent coronaries, suspect demand ischemia due to RVR.  [name] Chronic combined systolic and diastolic CHF (congestive heart failure) ([place])    a. 01/2011 Echo: EF 40-45%, diff HK, diast dysfxn, mild to mod MR, mod to sev dil LA/RV/RA, mod to sev reduced RV fxn, mod TR, PASP 32mmHg.;  b.  Echo 06/15/12:  EF 55%, mild MR, severe BAE, mod TR, PASP 35  . Diaphragmatic hernia without mention of obstruction or gangrene   . Epistaxis    a. s/p cauterization 11/2012.  [name] History of stroke   . HTN (hypertension)   . Hx of cardiovascular stress test    a. Myoview in 2006 was negative for ischemia, EF 54%. Martin Majestic on to have CAD development 05/2012.)  . Hypomagnesemia   . Lumbago   . Memory loss   . Mitral regurgitation    a. mild by echo 06/2012.  . Mixed incontinence urge and stress (female)(female)   . Osteoarthrosis, unspecified whether generalized or localized, unspecified site   . Osteoporosis, unspecified   . Pure hypercholesterolemia   . Renal infarction ([place])    01/2011 in setting of low INR  . Type II or unspecified type diabetes mellitus without mention of complication,  not stated as uncontrolled    Past Surgical History:  Procedure Laterality Date  . cataract surgery/both eyes     01/2012 left eye---02/2012 right eye  . CHOLECYSTECTOMY    . LEFT HEART CATHETERIZATION WITH CORONARY ANGIOGRAM N/A 05/28/2012   Procedure: LEFT HEART CATHETERIZATION WITH CORONARY ANGIOGRAM;  Surgeon: Sherren Mocha, MD;  [facility] C[facility] LAB;  Service:  Cardiovascular;  Laterality: N/A;  . LEFT HEART CATHETERIZATION WITH CORONARY ANGIOGRAM N/A 03/14/2013   Procedure: LEFT HEART CATHETERIZATION WITH CORONARY ANGIOGRAM;  Surgeon: Sinclair Grooms, MD;  Location: [facility] CATH LAB;  Service: Cardiovascular;  Laterality: N/A;  . PERCUTANEOUS CORONARY STENT INTERVENTION (PCI-S)  05/28/2012   Procedure: PERCUTANEOUS CORONARY STENT INTERVENTION (PCI-S);  Surgeon: Sherren Mocha, MD;  Location: [facility] CATH LAB;  Service: Cardiovascular;;  . rt.leg surgery  11/2010   Social History:  reports that she has never smoked. She has never used smokeless tobacco. She reports that she does not drink alcohol or use drugs.   Family History  Problem Relation Age of Onset  . Stomach cancer Father   . Pneumonia Mother   . CAD Brother      Allergies  Allergen Reactions  . Peanut-Containing Drug Products Anaphylaxis and Swelling  . Azithromycin Other (See Comments)    REACTION: pt states INTOL to ZPak  . Flomax [Tamsulosin Hcl] Swelling  . Pioglitazone Other (See Comments)    edema  . Pregabalin Other (See Comments)    REACTION: pt states INTOL to Lyrica  . Sulfonamide Derivatives Swelling     Prior to Admission medications   Medication Sig Start Date End Date Taking? Authorizing Provider  acetaminophen (TYLENOL) 500 MG tablet Take 500 mg by mouth every 6 (six) hours as needed for mild pain. 03/15/13  Yes Dayna N Dunn, PA-C  atenolol (TENORMIN) 50 MG tablet TAKE ONE TABLET BY MOUTH ONCE DAILY 12/31/15  Yes Evans Lance, MD  atorvastatin (LIPITOR) 40 MG tablet TAKE ONE TABLET BY MOUTH ONCE DAILY AT [facility] 04/27/15  Yes Noralee Space, MD  ciprofloxacin (CIPRO) 250 MG tablet Take 1 tablet (250 mg total) by mouth 2 (two) times daily. 02/23/16  Yes Noralee Space, MD  gabapentin (NEURONTIN) 100 MG capsule TAKE ONE CAPSULE BY MOUTH THREE TIMES DAILY 10/23/15  Yes Noralee Space, MD  glipiZIDE (GLUCOTROL) 10 MG tablet TAKE TWO TABLETS BY MOUTH TWICE DAILY Patient taking  differently: TAKE TWO TABLETS in the morning and 1 at dinner 02/26/15  Yes Noralee Space, MD  losartan (COZAAR) 25 MG tablet Take 25 mg by mouth daily.   Yes Historical Provider, MD  metFORMIN (GLUCOPHAGE) 500 MG tablet TAKE TWO TABLETS BY MOUTH TWICE DAILY WITH MEALS Patient taking differently: TAKE TWO TABLETS in the morning and 1 tablet at dinner 09/10/15  Yes Noralee Space, MD  rivaroxaban (XARELTO) 20 MG TABS tablet Take 20 mg by mouth daily with supper.   Yes Historical Provider, MD  vitamin B-12 (CYANOCOBALAMIN) 1000 MCG tablet Take 1,000 mcg by mouth every morning.    Yes Historical Provider, MD  ciprofloxacin (CIPRO) 250 MG tablet Take 1 tablet (250 mg total) by mouth 2 (two) times daily. Patient not taking: Reported on 02/29/2016 11/30/15   Noralee Space, MD  diltiazem (CARTIA XT) 120 MG 24 hr capsule Take 1 capsule (120 mg total) by mouth daily. 02/22/16   Evans Lance, MD  fluconazole (DIFLUCAN) 150 MG tablet Take 1 tablet now.  May repeat dose in 3 days  if persistent vaginal itching. Patient not taking: Reported on 02/29/2016 08/19/15   Janora Norlander, DO  furosemide (LASIX) 20 MG tablet TAKE TWO TABLETS BY MOUTH ONCE DAILY Patient not taking: Reported on 02/29/2016 05/07/15   Evans Lance, MD  MAGNESIUM-OXIDE 400 (241.3 Mg) MG tablet TAKE ONE TABLET BY MOUTH TWICE DAILY Patient not taking: Reported on 02/29/2016 07/24/15   Evans Lance, MD  nitroGLYCERIN (NITROSTAT) 0.4 MG SL tablet Place 1 tablet (0.4 mg total) under the tongue every 5 (five) minutes as needed for chest pain (up to 3 doses). 03/15/13   Dayna N Dunn, PA-C  oxybutynin (DITROPAN) 5 MG tablet Take 1 tablet (5 mg total) by mouth 2 (two) times daily. Patient not taking: Reported on 02/29/2016 02/25/15   Noralee Space, MD  traMADol Veatrice Bourbon) 50 MG tablet Take 1/2-1 tablet by mouth three times a day as needed for pain Patient not taking: Reported on 02/29/2016 10/24/14   Noralee Space, MD  XARELTO 15 MG TABS tablet TAKE ONE  TABLET BY MOUTH ONCE DAILY WITH  SUPPER Patient not taking: Reported on 02/29/2016 02/22/16   Evans Lance, MD    Review of Systems:  Constitutional:  No weight loss, night sweats, Fevers, chills, fatigue.  Head&Eyes: No headache.  No vision loss.  No eye pain or scotoma ENT:  No Difficulty swallowing,Tooth/dental problems,Sore throat,  No ear ache, post nasal drip,  Cardio-vascular:  No  Orthopnea, PND, swelling in lower extremities,  dizziness, palpitations  GI:  No  abdominal pain, nausea, vomiting, diarrhea, loss of appetite, hematochezia, melena, heartburn, indigestion, Resp:  No shortness of breath with exertion or at rest. No cough. No coughing up of blood .No wheezing.No chest wall deformity  Skin:  no rash or lesions.  GU:  no dysuria, change in color of urine, no urgency or frequency. No flank pain.  Musculoskeletal:  No joint pain or swelling. No decreased range of motion. No back pain.  Psych:  No change in mood or affect. No depression or anxiety. Neurologic: No headache, no dysesthesia, no focal weakness, no vision loss. No syncope  Physical Exam: Vitals:   02/29/16 1403 02/29/16 1430 02/29/16 1500 02/29/16 1604  BP: 151/86 165/97 (!) 146/103 (!) 166/98  Pulse: 78 85 79 77  Resp: 15 21 15    Temp:    97.8 F (36.6 C)  TempSrc:    Oral  SpO2: 99% 99% 95% 90%  Weight:    53.1 kg (117 lb 1 oz)  Height:    5\' 5"  (1.651 m)   General:  A&O x 3, NAD, nontoxic, pleasant/cooperative Head/Eye: No conjunctival hemorrhage, no icterus, [place]/AT, No nystagmus ENT:  No icterus,  No thrush, good dentition, no pharyngeal exudate Neck:  No masses, no lymphadenpathy, no bruits CV:  RRR, no rub, no gallop, no S3 Lung:  CTAB, good air movement, no wheeze, no rhonchi Abdomen: soft/NT, +BS, nondistended, no peritoneal signs Ext: No cyanosis, No rashes, No petechiae, No lymphangitis, No edema Neuro: CNII-XII intact, strength 4/5 in bilateral upper and lower extremities, no  dysmetria  Labs on Admission:  Basic Metabolic Panel:  Recent Labs Lab 02/29/16 1040  NA 136  K 3.7  CL 99*  CO2 28  GLUCOSE 288*  BUN 14  CREATININE 0.79  CALCIUM 9.4   Liver Function Tests:  Recent Labs Lab 02/29/16 1040  AST 20  ALT 14  ALKPHOS 74  BILITOT 1.6*  PROT 6.8  ALBUMIN 4.1    Recent  Labs Lab 02/29/16 1040  LIPASE 34   No results for input(s): AMMONIA in the last 168 hours. CBC:  Recent Labs Lab 02/29/16 1040  WBC 4.8  HGB 13.7  HCT 39.5  MCV 90.6  PLT 171   Coagulation Profile: No results for input(s): INR, PROTIME in the last 168 hours. Cardiac Enzymes:  Recent Labs Lab 02/29/16 1645  TROPONINI <0.03   BNP: Invalid input(s): POCBNP CBG:  Recent Labs Lab 02/29/16 1728  GLUCAP 201*   Urine analysis:    Component Value Date/Time   COLORURINE YELLOW 02/29/2016 1116   APPEARANCEUR CLOUDY (A) 02/29/2016 1116   LABSPEC 1.011 02/29/2016 1116   PHURINE 5.0 02/29/2016 1116   GLUCOSEU >=500 (A) 02/29/2016 1116   GLUCOSEU NEGATIVE 10/14/2008 1148   HGBUR SMALL (A) 02/29/2016 1116   BILIRUBINUR NEGATIVE 02/29/2016 1116   KETONESUR 20 (A) 02/29/2016 1116   PROTEINUR NEGATIVE 02/29/2016 1116   UROBILINOGEN 0.2 02/05/2011 1117   NITRITE POSITIVE (A) 02/29/2016 1116   LEUKOCYTESUR LARGE (A) 02/29/2016 1116   Sepsis Labs: @LABRCNTIP (procalcitonin:4,lacticidven:4) )No results found for this or any previous visit (from the past 240 hour(s)).   Radiological Exams on Admission: Dg Chest 2 View  Result Date: 02/29/2016 CLINICAL DATA:  Epigastric abdominal pain for 3 days. EXAM: CHEST  2 VIEW COMPARISON:  04/24/2014 FINDINGS: The heart is enlarged but stable. Stable tortuosity of the thoracic aorta. There are chronic lung changes and emphysema but no acute overlying pulmonary findings. The bony thorax is intact. IMPRESSION: Stable cardiac enlargement and chronic lung changes with emphysema but no definite acute overlying pulmonary  process. Electronically Signed   By: Marijo Sanes M.D.   On: 02/29/2016 12:27    EKG: Independently reviewed. Atrial fibrillation, nonspecific ST changes    Time spent:50 minutes Code Status:   FULL Family Communication:  No Family at bedside Disposition Plan: expect 1 day hospitalization Consults called: cardiology DVT Prophylaxis: [place] Lovenox  Almina Schul, DO  [facility] Pager [phone]  If 7PM-7AM, please contact night-coverage [email or web address] Password [facility] 02/29/2016, 6:07 PM

## 2016-02-29 NOTE — ED Provider Notes (Signed)
[facility] DEPT Provider Note   CSN: [id number] Arrival date & time: 02/29/16  [id number]     History   Chief Complaint Chief Complaint  Patient presents with  . Abdominal Pain    HPI Michelle Herrera is a 83 y.o. female.  Patient with the known cardiac disease had stents placed in 2014 right coronary artery. Followed by Dr. Lovena Le from cardiology. Patient with a history of left chest pain intermittently started 3 days ago but got worse today and then worse at night. Your patient is had recurrent chest pain for several minutes but less than 15 minutes about 10 minutes apart throughout her stay. Not associated with shortness of breath not associated with nausea or vomiting. Patient has a history of atrial fibrillation. She's been off of her blood thinner now for several days ago she has not been able to get it renewed.      Past Medical History:  Diagnosis Date  . AF (atrial fibrillation) ([place])    a. Chronic Xarelto  . Anxiety state, unspecified   . Benign neoplasm of colon   . CAD (coronary artery disease)    a. 05/2012 NSTEMI/Cath/PCI: LM nl, LAD min irregs, LCX min irregs, RI 40-50 ost, OM1 nl, RCA dom, 43m (4.0x12 Vision BMS), PD/PL nl, EF 55%, at least mod MR. b. NSTEMI 03/2013 - cath with widely patent coronaries, suspect demand ischemia due to RVR.  [name] Chronic combined systolic and diastolic CHF (congestive heart failure) ([place])    a. 01/2011 Echo: EF 40-45%, diff HK, diast dysfxn, mild to mod MR, mod to sev dil LA/RV/RA, mod to sev reduced RV fxn, mod TR, PASP 20mmHg.;  b.  Echo 06/15/12:  EF 55%, mild MR, severe BAE, mod TR, PASP 35  . Diaphragmatic hernia without mention of obstruction or gangrene   . Epistaxis    a. s/p cauterization 11/2012.  [name] History of stroke   . HTN (hypertension)   . Hx of cardiovascular stress test    a. Myoview in 2006 was negative for ischemia, EF 54%. Martin Majestic on to have CAD development 05/2012.)  . Hypomagnesemia   . Lumbago   . Memory loss   .  Mitral regurgitation    a. mild by echo 06/2012.  . Mixed incontinence urge and stress (female)(female)   . Osteoarthrosis, unspecified whether generalized or localized, unspecified site   . Osteoporosis, unspecified   . Pure hypercholesterolemia   . Renal infarction ([place])    01/2011 in setting of low INR  . Type II or unspecified type diabetes mellitus without mention of complication, not stated as uncontrolled     Patient Active Problem List   Diagnosis Date Noted  . Type 2 diabetes mellitus, uncontrolled, with neuropathy ([place]) 04/24/2013  . Hypomagnesemia 03/15/2013  . CAD (coronary artery disease) 03/13/2013  . History of epistaxis 03/13/2013  . Abnormal TSH 03/13/2013  . Acute CHF ([place]) 01/20/2013  . Hypokalemia 01/20/2013  . Epistaxis 12/04/2012  . Mitral regurgitation 05/29/2012  . Chronic combined systolic and diastolic CHF (congestive heart failure) ([place])   . Acute myocardial infarction, subendocardial infarction, initial episode of care ([place]) 05/26/2012  . Chest pain 05/25/2012  . Vitamin B 12 deficiency 04/19/2011  . Renal infarction ([place]) 01/31/2011  . History of stroke 08/16/2010  . Anemia 04/14/2010  . Permanent atrial fibrillation ([place]) 03/10/2010  . Long term current use of anticoagulant 03/10/2010  . SHOULDER PAIN, RIGHT 04/13/2009  . UNSTEADY GAIT 11/03/2008  . BACK PAIN, LUMBAR 10/14/2008  .  MEMORY LOSS 07/29/2007  . URINARY INCONTINENCE, MIXED 07/29/2007  . COLONIC POLYPS 01/23/2007  . HYPERCHOLESTEROLEMIA 01/23/2007  . Anxiety state 01/23/2007  . Essential hypertension 01/23/2007  . CHF 01/23/2007  . Diaphragmatic hernia 01/23/2007  . Osteoarthritis 01/23/2007  . Osteoporosis 01/23/2007    Past Surgical History:  Procedure Laterality Date  . cataract surgery/both eyes     01/2012 left eye---02/2012 right eye  . CHOLECYSTECTOMY    . LEFT HEART CATHETERIZATION WITH CORONARY ANGIOGRAM N/A 05/28/2012   Procedure: LEFT HEART CATHETERIZATION WITH CORONARY  ANGIOGRAM;  Surgeon: Sherren Mocha, MD;  Location: [facility] CATH LAB;  Service: Cardiovascular;  Laterality: N/A;  . LEFT HEART CATHETERIZATION WITH CORONARY ANGIOGRAM N/A 03/14/2013   Procedure: LEFT HEART CATHETERIZATION WITH CORONARY ANGIOGRAM;  Surgeon: Sinclair Grooms, MD;  [facility] C[facility] LAB;  Service: Cardiovascular;  Laterality: N/A;  . PERCUTANEOUS CORONARY STENT INTERVENTION (PCI-S)  05/28/2012   Procedure: PERCUTANEOUS CORONARY STENT INTERVENTION (PCI-S);  Surgeon: Sherren Mocha, MD;  Location: [facility] CATH LAB;  Service: Cardiovascular;;  . rt.leg surgery  11/2010    OB History    No data available       Home Medications    Prior to Admission medications   Medication Sig Start Date End Date Taking? Authorizing Provider  acetaminophen (TYLENOL) 500 MG tablet Take 500 mg by mouth every 6 (six) hours as needed for mild pain. 03/15/13  Yes Dayna N Dunn, PA-C  atenolol (TENORMIN) 50 MG tablet TAKE ONE TABLET BY MOUTH ONCE DAILY 12/31/15  Yes Evans Lance, MD  atorvastatin (LIPITOR) 40 MG tablet TAKE ONE TABLET BY MOUTH ONCE DAILY AT [facility] 04/27/15  Yes Noralee Space, MD  ciprofloxacin (CIPRO) 250 MG tablet Take 1 tablet (250 mg total) by mouth 2 (two) times daily. 02/23/16  Yes Noralee Space, MD  gabapentin (NEURONTIN) 100 MG capsule TAKE ONE CAPSULE BY MOUTH THREE TIMES DAILY 10/23/15  Yes Noralee Space, MD  glipiZIDE (GLUCOTROL) 10 MG tablet TAKE TWO TABLETS BY MOUTH TWICE DAILY Patient taking differently: TAKE TWO TABLETS in the morning and 1 at dinner 02/26/15  Yes Noralee Space, MD  losartan (COZAAR) 25 MG tablet Take 25 mg by mouth daily.   Yes Historical Provider, MD  metFORMIN (GLUCOPHAGE) 500 MG tablet TAKE TWO TABLETS BY MOUTH TWICE DAILY WITH MEALS Patient taking differently: TAKE TWO TABLETS in the morning and 1 tablet at dinner 09/10/15  Yes Noralee Space, MD  rivaroxaban (XARELTO) 20 MG TABS tablet Take 20 mg by mouth daily with supper.   Yes Historical Provider, MD  vitamin B-12  (CYANOCOBALAMIN) 1000 MCG tablet Take 1,000 mcg by mouth every morning.    Yes Historical Provider, MD  ciprofloxacin (CIPRO) 250 MG tablet Take 1 tablet (250 mg total) by mouth 2 (two) times daily. Patient not taking: Reported on 02/29/2016 11/30/15   Noralee Space, MD  diltiazem (CARTIA XT) 120 MG 24 hr capsule Take 1 capsule (120 mg total) by mouth daily. 02/22/16   Evans Lance, MD  fluconazole (DIFLUCAN) 150 MG tablet Take 1 tablet now.  May repeat dose in 3 days if persistent vaginal itching. Patient not taking: Reported on 02/29/2016 08/19/15   Janora Norlander, DO  furosemide (LASIX) 20 MG tablet TAKE TWO TABLETS BY MOUTH ONCE DAILY Patient not taking: Reported on 02/29/2016 05/07/15   Evans Lance, MD  MAGNESIUM-OXIDE 400 (241.3 Mg) MG tablet TAKE ONE TABLET BY MOUTH TWICE DAILY Patient not taking: Reported on  02/29/2016 07/24/15   Evans Lance, MD  nitroGLYCERIN (NITROSTAT) 0.4 MG SL tablet Place 1 tablet (0.4 mg total) under the tongue every 5 (five) minutes as needed for chest pain (up to 3 doses). 03/15/13   Dayna N Dunn, PA-C  oxybutynin (DITROPAN) 5 MG tablet Take 1 tablet (5 mg total) by mouth 2 (two) times daily. Patient not taking: Reported on 02/29/2016 02/25/15   Noralee Space, MD  traMADol Veatrice Bourbon) 50 MG tablet Take 1/2-1 tablet by mouth three times a day as needed for pain Patient not taking: Reported on 02/29/2016 10/24/14   Noralee Space, MD  XARELTO 15 MG TABS tablet TAKE ONE TABLET BY MOUTH ONCE DAILY WITH  SUPPER Patient not taking: Reported on 02/29/2016 02/22/16   Evans Lance, MD    Family History Family History  Problem Relation Age of Onset  . Stomach cancer Father   . Pneumonia Mother   . CAD Brother     Social History Social History  Substance Use Topics  . Smoking status: Never Smoker  . Smokeless tobacco: Never Used  . Alcohol use No     Allergies   Peanut-containing drug products; Azithromycin; Flomax [tamsulosin hcl]; Pioglitazone; Pregabalin; and  Sulfonamide derivatives   Review of Systems Review of Systems  Constitutional: Negative for fever.  HENT: Negative for congestion.   Eyes: Negative for redness.  Respiratory: Negative for shortness of breath.   Cardiovascular: Positive for chest pain.  Gastrointestinal: Positive for abdominal pain. Negative for nausea and vomiting.  Genitourinary: Negative for dysuria.  Musculoskeletal: Negative for back pain.  Skin: Negative for rash.  Neurological: Negative for headaches.  Hematological: Does not bruise/bleed easily.  Psychiatric/Behavioral: Negative for confusion.     Physical Exam Updated Vital Signs BP 151/86   Pulse 78   Temp 98.6 F (37 C) (Oral)   Resp 15   SpO2 99%   Physical Exam  Constitutional: She is oriented to person, place, and time. She appears well-developed and well-nourished. No distress.  HENT:  Head: Normocephalic and atraumatic.  Mouth/Throat: Oropharynx is clear and moist.  Eyes: Conjunctivae and EOM are normal. Pupils are equal, round, and reactive to light.  Neck: Normal range of motion. Neck supple.  Cardiovascular: Normal rate, regular rhythm and normal heart sounds.   Pulmonary/Chest: Effort normal and breath sounds normal. No respiratory distress.  Abdominal: Soft. Bowel sounds are normal. There is no tenderness.  Musculoskeletal: Normal range of motion.  Neurological: She is alert and oriented to person, place, and time. No cranial nerve deficit or sensory deficit. She exhibits normal muscle tone. Coordination normal.  Skin: Skin is warm.  Nursing note and vitals reviewed.    ED Treatments / Results  Labs (all labs ordered are listed, but only abnormal results are displayed) Labs Reviewed  COMPREHENSIVE METABOLIC PANEL - Abnormal; Notable for the following:       Result Value   Chloride 99 (*)    Glucose, Bld 288 (*)    Total Bilirubin 1.6 (*)    All other components within normal limits  URINALYSIS, ROUTINE W REFLEX MICROSCOPIC -  Abnormal; Notable for the following:    APPearance CLOUDY (*)    Glucose, UA >=500 (*)    Hgb urine dipstick SMALL (*)    Ketones, ur 20 (*)    Nitrite POSITIVE (*)    Leukocytes, UA LARGE (*)    Bacteria, UA FEW (*)    Squamous Epithelial / LPF 0-5 (*)  All other components within normal limits  URINE CULTURE  LIPASE, BLOOD  CBC  I-STAT TROPOININ, ED   Results for orders placed or performed during the hospital encounter of 02/29/16  Lipase, blood  Result Value Ref Range   Lipase 34 11 - 51 U/L  Comprehensive metabolic panel  Result Value Ref Range   Sodium 136 135 - 145 mmol/L   Potassium 3.7 3.5 - 5.1 mmol/L   Chloride 99 (L) 101 - 111 mmol/L   CO2 28 22 - 32 mmol/L   Glucose, Bld 288 (H) 65 - 99 mg/dL   BUN 14 6 - 20 mg/dL   Creatinine, Ser 0.79 0.44 - 1.00 mg/dL   Calcium 9.4 8.9 - 10.3 mg/dL   Total Protein 6.8 6.5 - 8.1 g/dL   Albumin 4.1 3.5 - 5.0 g/dL   AST 20 15 - 41 U/L   ALT 14 14 - 54 U/L   Alkaline Phosphatase 74 38 - 126 U/L   Total Bilirubin 1.6 (H) 0.3 - 1.2 mg/dL   GFR calc non Af Amer >60 >60 mL/min   GFR calc Af Amer >60 >60 mL/min   Anion gap 9 5 - 15  CBC  Result Value Ref Range   WBC 4.8 4.0 - 10.5 K/uL   RBC 4.36 3.87 - 5.11 MIL/uL   Hemoglobin 13.7 12.0 - 15.0 g/dL   HCT 39.5 36.0 - 46.0 %   MCV 90.6 78.0 - 100.0 fL   MCH 31.4 26.0 - 34.0 pg   MCHC 34.7 30.0 - 36.0 g/dL   RDW 12.5 11.5 - 15.5 %   Platelets 171 150 - 400 K/uL  Urinalysis, Routine w reflex microscopic  Result Value Ref Range   Color, Urine YELLOW YELLOW   APPearance CLOUDY (A) CLEAR   Specific Gravity, Urine 1.011 1.005 - 1.030   pH 5.0 5.0 - 8.0   Glucose, UA >=500 (A) NEGATIVE mg/dL   Hgb urine dipstick SMALL (A) NEGATIVE   Bilirubin Urine NEGATIVE NEGATIVE   Ketones, ur 20 (A) NEGATIVE mg/dL   Protein, ur NEGATIVE NEGATIVE mg/dL   Nitrite POSITIVE (A) NEGATIVE   Leukocytes, UA LARGE (A) NEGATIVE   RBC / HPF 6-30 0 - 5 RBC/hpf   WBC, UA TOO NUMEROUS TO COUNT 0 -  5 WBC/hpf   Bacteria, UA FEW (A) NONE SEEN   Squamous Epithelial / LPF 0-5 (A) NONE SEEN   WBC Clumps PRESENT   I-Stat Troponin, ED (not at [facility])  Result Value Ref Range   Troponin i, poc 0.00 0.00 - 0.08 ng/mL   Comment 3            EKG  EKG Interpretation  Date/Time:  Monday February 29 2016 10:16:51 EST Ventricular Rate:  86 PR Interval:    QRS Duration: 93 QT Interval:  361 QTC Calculation: 432 R Axis:   92 Text Interpretation:  Atrial fibrillation Right axis deviation Low voltage, extremity leads Borderline repolarization abnormality Lead(s) I were not used for morphology analysis New lateral Twave changes now upright Confirmed by Rogene Houston  MD, Shanekia Latella [phone]) on 02/29/2016 10:22:17 AM       Radiology Dg Chest 2 View  Result Date: 02/29/2016 CLINICAL DATA:  Epigastric abdominal pain for 3 days. EXAM: CHEST  2 VIEW COMPARISON:  04/24/2014 FINDINGS: The heart is enlarged but stable. Stable tortuosity of the thoracic aorta. There are chronic lung changes and emphysema but no acute overlying pulmonary findings. The bony thorax is intact. IMPRESSION: Stable cardiac enlargement  and chronic lung changes with emphysema but no definite acute overlying pulmonary process. Electronically Signed   By: Marijo Sanes M.D.   On: 02/29/2016 12:27    Procedures Procedures (including critical care time)  Medications Ordered in ED Medications  aspirin chewable tablet 324 mg (not administered)  cefTRIAXone (ROCEPHIN) 1 g in dextrose 5 % 50 mL IVPB (1 g Intravenous New Bag/Given 02/29/16 1244)     Initial Impression / Assessment and Plan / ED Course  I have reviewed the triage vital signs and the nursing notes.  Pertinent labs & imaging results that were available during my care of the patient were reviewed by me and considered in my medical decision making (see chart for details).   patient with intermittent frequent recurrent chest pain. Initial troponin negative. But the frequency of the  recurrent still raises concerns for an acute coronary syndrome. Patient's chest x-ray negative also patient has chronic atrial fib and has been off any blood thinners for the past several days. Workup here no evidence of any acute MI at this point. But patient will warrant admission for cardiac rule out. Did have stents placed in 2014 in her right coronary artery. Discussed initially with cardiology they recommend internal medicine admission and they will consult. Rest of labs did show evidence of a urinary tract infection. Patient started on the antibiotic for that and urine culture sent.  Patient with complaint of some epigastric discomfort however nontender on exam. Main concern is more for cardiac source.  Patient was given aspirin here since she's been off blood thinners.   Final Clinical Impressions(s) / ED Diagnoses   Final diagnoses:  Chest pain, unspecified type    New Prescriptions New Prescriptions   No medications on file     Fredia Sorrow, MD 02/29/16 1450

## 2016-02-29 NOTE — ED Notes (Signed)
RN at bedside collecting labs at this time

## 2016-02-29 NOTE — Telephone Encounter (Signed)
PER  SON ,PT IS ON WAY  TO [place] ER HIS BROTHER  IS TAKING PT./CY

## 2016-03-01 ENCOUNTER — Observation Stay (HOSPITAL_COMMUNITY): Payer: Medicare Other

## 2016-03-01 ENCOUNTER — Observation Stay (HOSPITAL_BASED_OUTPATIENT_CLINIC_OR_DEPARTMENT_OTHER): Payer: Medicare Other

## 2016-03-01 DIAGNOSIS — E1165 Type 2 diabetes mellitus with hyperglycemia: Secondary | ICD-10-CM | POA: Diagnosis not present

## 2016-03-01 DIAGNOSIS — R079 Chest pain, unspecified: Secondary | ICD-10-CM | POA: Diagnosis not present

## 2016-03-01 DIAGNOSIS — Z7901 Long term (current) use of anticoagulants: Secondary | ICD-10-CM | POA: Diagnosis not present

## 2016-03-01 DIAGNOSIS — I5042 Chronic combined systolic (congestive) and diastolic (congestive) heart failure: Secondary | ICD-10-CM | POA: Diagnosis not present

## 2016-03-01 DIAGNOSIS — I482 Chronic atrial fibrillation: Secondary | ICD-10-CM | POA: Diagnosis not present

## 2016-03-01 DIAGNOSIS — N3000 Acute cystitis without hematuria: Secondary | ICD-10-CM

## 2016-03-01 DIAGNOSIS — I10 Essential (primary) hypertension: Secondary | ICD-10-CM | POA: Diagnosis not present

## 2016-03-01 LAB — ECHOCARDIOGRAM COMPLETE
HEIGHTINCHES: 65 in
WEIGHTICAEL: 1869.5 [oz_av]

## 2016-03-01 LAB — BASIC METABOLIC PANEL
Anion gap: 8 (ref 5–15)
BUN: 17 mg/dL (ref 6–20)
CHLORIDE: 103 mmol/L (ref 101–111)
CO2: 28 mmol/L (ref 22–32)
CREATININE: 0.76 mg/dL (ref 0.44–1.00)
Calcium: 9.1 mg/dL (ref 8.9–10.3)
GFR calc Af Amer: >60 mL/min (ref >60)
GFR calc non Af Amer: >60 mL/min (ref >60)
GLUCOSE: 164 mg/dL — AB (ref 65–99)
POTASSIUM: 4 mmol/L (ref 3.5–5.1)
Sodium: 139 mmol/L (ref 135–145)

## 2016-03-01 LAB — TROPONIN I

## 2016-03-01 LAB — GLUCOSE, CAPILLARY
GLUCOSE-CAPILLARY: 207 mg/dL — AB (ref 65–99)
Glucose-Capillary: 163 mg/dL — ABNORMAL HIGH (ref 65–99)
Glucose-Capillary: 147 mg/dL — ABNORMAL HIGH (ref 65–99)
Glucose-Capillary: 267 mg/dL — ABNORMAL HIGH (ref 65–99)

## 2016-03-01 MED ORDER — IOPAMIDOL (ISOVUE-300) INJECTION 61%
INTRAVENOUS | Status: AC
  Filled 2016-03-01: qty 30

## 2016-03-01 MED ORDER — IOPAMIDOL (ISOVUE-300) INJECTION 61%
INTRAVENOUS | Status: AC
  Filled 2016-03-01: qty 100

## 2016-03-01 MED ORDER — SODIUM CHLORIDE 0.9 % IJ SOLN
INTRAMUSCULAR | Status: AC
  Filled 2016-03-01: qty 50

## 2016-03-01 MED ORDER — IOPAMIDOL (ISOVUE-300) INJECTION 61%
100.0000 mL | Freq: Once | INTRAVENOUS | Status: AC | PRN
  Administered 2016-03-01: 100 mL via INTRAVENOUS

## 2016-03-01 MED ORDER — IOPAMIDOL (ISOVUE-300) INJECTION 61%: 15.0000 mL | Freq: Once | INTRAVENOUS | Status: DC | PRN

## 2016-03-01 NOTE — Progress Notes (Signed)
  Echocardiogram 2D Echocardiogram has been performed.  Michelle Herrera 03/01/2016, 9:12 AM

## 2016-03-01 NOTE — Progress Notes (Signed)
PROGRESS NOTE    Michelle Herrera  [id number] DOB: [birth year] DOA: 02/29/2016 PCP: Noralee Space, MD  Outpatient Specialists:  - Cardiology: Evans Lance, MD    Brief Narrative:  Michelle Herrera is an 83 y.o. female with a past medical history of atrial fibrillation on Xarelto, hypertension, coronary artery disease with stent in RCA in 2014, and diabetes mellitus who presented to the emergency room with CP of 2-3 days located on the left side below her breast and radiates across to the right side of her chest. Upon asking patient today about the pain she describes it to be on the on the left around the 10th rib, radiates across the abdomen to the right, and first started 2-3 weeks ago. The pain usually last 2-5 minutes and was worse at night when laying down. She recalls a fall about 1 month ago from the bed landing on her bottom and hitting her L shoulder but not her side. No fever, chills, sob, pain with inspiration or cough. No recent respiratory illnesses. No nausea, vomiting, or changes in bowel habits. She does have urinary frequency and burning.    Subjective: Patient states that she is doing well and has had no pain since she has been at the hospital. She says she does not have much of an appetite and has lost some weight, but denies any recent illness, no upper or lower respiratory symptoms, nausea, vomiting, or diarrhea. She is having some burning and urinary frequency.    Assessment & Plan:   Active Problems:   Essential hypertension   Permanent atrial fibrillation (HCC)   Long term current use of anticoagulant   Chest pain   Chronic combined systolic and diastolic CHF (congestive heart failure) (HCC)   Type 2 diabetes mellitus, uncontrolled, with neuropathy ([place])   UTI (urinary tract infection)   Urinary tract infection without hematuria  1. Chest Pain. Pain was though to be chest pain in emergency department. However, today, patient describes that the pain was  more abdominal. She has some mild tenderness to palpation in the epigastrium and LUQ. Pain is possibly from UTI, but it is unclear.  - EKG showed atrial fibrillation and non-specific ST changes.  - Troponin was trended and was not elevated (<0.03 x 3). - BMP and CBC within normal limits except glucose 288 - Lipase wnl (34)  -TTE was performed today, results pending - CT abdomen and pelvis with contrast ordered today to rule out acute findings.      2. UTI Patient is afebrile but has symptoms of urgency and frequency and UA showed TNTC WBC with few bacteria and positive nitrites. -Start IV Ceftriaxone 1g q 24 hours  - Urine Culture pending  3. Permanent Atrial Fibrillation Chronic, the rate is controlled.  -CHA2DS2-VASc score is 9, Continue Xarelto -Continue Diltiazem and Atenolol   4. Hypertension Chronic. -Continue Losartan, Atenolol, and Diltiazem  5. Diabetes Mellitus Type 2, uncontrolled with neuropathy - NovoLog sliding scale - Hold Metformin and Glipizide - Continue Gabapentin - Check CBG before meals and at bedtime  6. Chronic combined systolic and diastolic CHF  - Euvolemic clinically, no SOB or pedal edema - Daily Weights - Last Echo (05/28/13) showed EF 50-55% - New Echo pending   DVT prophylaxis: Xarelto Code Status: Full Family Communication: No, discussed with the patient at bedside Disposition Plan: TBD   Consultants:   Cardiology  Procedures:  Echo- pending  EKG: Showed A. Fib with non specific ST changes  Antimicrobials:  Ceftriaxone Start: 03/01/16 Stop: waiting on urine culture    Objective: Vitals:   02/29/16 1500 02/29/16 1604 02/29/16 2224 03/01/16 0644  BP: (!) 146/103 (!) 166/98 112/76 134/76  Pulse: 79 77 69 72  Resp: 15  16 18   Temp:  97.8 F (36.6 C) 98.2 F (36.8 C) 98.2 F (36.8 C)  TempSrc:  Oral Oral Oral  SpO2: 95% 90% 99% 99%  Weight:  53.1 kg (117 lb 1 oz)  53 kg (116 lb 13.5 oz)  Height:  5\' 5"  (1.651 m)       Intake/Output Summary (Last 24 hours) at 03/01/16 0945 Last data filed at 03/01/16 0541  Gross per 24 hour  Intake               60 ml  Output                0 ml  Net               60 ml   Filed Weights   02/29/16 1604 03/01/16 0644  Weight: 53.1 kg (117 lb 1 oz) 53 kg (116 lb 13.5 oz)    Examination:  General exam: Appears calm and comfortable  Respiratory system: Clear to auscultation. Respiratory effort normal. Cardiovascular system: S1 & S2 heard, irregular RR. No JVD, murmurs, rubs, gallops or clicks. No pedal edema. Gastrointestinal system: Abdomen is nondistended, soft and non-tender except mild tenderness in epigastrium and left upper quadrant. No organomegaly or masses felt. Normal bowel sounds heard. Central nervous system: Alert and oriented. No focal neurological deficits. Extremities: Symmetric 5 x 5 power.   Skin: No rashes, lesions or ulcers. Scar on abdomen from gallbladder surgery.  Psychiatry: Judgement and insight appear normal. Mood & affect appropriate.     Data Reviewed: I have personally reviewed following labs and imaging studies  CBC:  Recent Labs Lab 02/29/16 1040  WBC 4.8  HGB 13.7  HCT 39.5  MCV 90.6  PLT [id number]   Basic Metabolic Panel:  Recent Labs Lab 02/29/16 1040 03/01/16 0509  NA 136 139  K 3.7 4.0  CL 99* 103  CO2 28 28  GLUCOSE 288* 164*  BUN 14 17  CREATININE 0.79 0.76  CALCIUM 9.4 9.1   GFR: Estimated Creatinine Clearance: 44.6 mL/min (by C-G formula based on SCr of 0.76 mg/dL). Liver Function Tests:  Recent Labs Lab 02/29/16 1040  AST 20  ALT 14  ALKPHOS 74  BILITOT 1.6*  PROT 6.8  ALBUMIN 4.1    Recent Labs Lab 02/29/16 1040  LIPASE 34   No results for input(s): AMMONIA in the last 168 hours. Coagulation Profile: No results for input(s): INR, PROTIME in the last 168 hours. Cardiac Enzymes:  Recent Labs Lab 02/29/16 1645 02/29/16 2203 03/01/16 0509  TROPONINI <0.03 <0.03 <0.03   BNP (last 3  results) No results for input(s): PROBNP in the last 8760 hours. HbA1C: No results for input(s): HGBA1C in the last 72 hours. CBG:  Recent Labs Lab 02/29/16 1728 02/29/16 2230 03/01/16 0935  GLUCAP 201* 202* 163*   Lipid Profile: No results for input(s): CHOL, HDL, LDLCALC, TRIG, CHOLHDL, LDLDIRECT in the last 72 hours. Thyroid Function Tests: No results for input(s): TSH, T4TOTAL, FREET4, T3FREE, THYROIDAB in the last 72 hours. Anemia Panel: No results for input(s): VITAMINB12, FOLATE, FERRITIN, TIBC, IRON, RETICCTPCT in the last 72 hours. Urine analysis:    Component Value Date/Time   COLORURINE YELLOW 02/29/2016 1116   APPEARANCEUR CLOUDY (A) 02/29/2016 1116  LABSPEC 1.011 02/29/2016 1116   PHURINE 5.0 02/29/2016 1116   GLUCOSEU >=500 (A) 02/29/2016 1116   GLUCOSEU NEGATIVE 10/14/2008 1148   HGBUR SMALL (A) 02/29/2016 1116   BILIRUBINUR NEGATIVE 02/29/2016 1116   KETONESUR 20 (A) 02/29/2016 1116   PROTEINUR NEGATIVE 02/29/2016 1116   UROBILINOGEN 0.2 02/05/2011 1117   NITRITE POSITIVE (A) 02/29/2016 1116   LEUKOCYTESUR LARGE (A) 02/29/2016 1116   Sepsis Labs: @LABRCNTIP (procalcitonin:4,lacticidven:4)  )No results found for this or any previous visit (from the past 240 hour(s)).       Radiology Studies: Dg Chest 2 View  Result Date: 02/29/2016 CLINICAL DATA:  Epigastric abdominal pain for 3 days. EXAM: CHEST  2 VIEW COMPARISON:  04/24/2014 FINDINGS: The heart is enlarged but stable. Stable tortuosity of the thoracic aorta. There are chronic lung changes and emphysema but no acute overlying pulmonary findings. The bony thorax is intact. IMPRESSION: Stable cardiac enlargement and chronic lung changes with emphysema but no definite acute overlying pulmonary process. Electronically Signed   By: Marijo Sanes M.D.   On: 02/29/2016 12:27        Scheduled Meds: . atenolol  50 mg Oral Daily  . atorvastatin  40 mg Oral q1800  . cefTRIAXone (ROCEPHIN)  IV  1 g  Intravenous Q24H  . diltiazem  120 mg Oral Daily  . gabapentin  100 mg Oral TID  . insulin aspart  0-5 Units Subcutaneous QHS  . insulin aspart  0-9 Units Subcutaneous TID WC  . losartan  25 mg Oral Daily  . rivaroxaban  15 mg Oral Q supper  . sodium chloride flush  3 mL Intravenous Q12H   Continuous Infusions:   LOS: 0 days    Time spent: [place], PA-S [facility] 03/01/2016   [facility] Pager [phone]  If 7PM-7AM, please contact night-coverage [email or web address] Password [email or web address] 03/01/2016, 9:45 AM

## 2016-03-01 NOTE — Telephone Encounter (Signed)
Called, pt unavailable. No voicemail. 

## 2016-03-02 DIAGNOSIS — E1140 Type 2 diabetes mellitus with diabetic neuropathy, unspecified: Secondary | ICD-10-CM | POA: Diagnosis not present

## 2016-03-02 DIAGNOSIS — I10 Essential (primary) hypertension: Secondary | ICD-10-CM | POA: Diagnosis not present

## 2016-03-02 DIAGNOSIS — I482 Chronic atrial fibrillation: Secondary | ICD-10-CM

## 2016-03-02 DIAGNOSIS — E1165 Type 2 diabetes mellitus with hyperglycemia: Secondary | ICD-10-CM | POA: Diagnosis not present

## 2016-03-02 DIAGNOSIS — B9620 Unspecified Escherichia coli [E. coli] as the cause of diseases classified elsewhere: Secondary | ICD-10-CM

## 2016-03-02 DIAGNOSIS — I5042 Chronic combined systolic (congestive) and diastolic (congestive) heart failure: Secondary | ICD-10-CM | POA: Diagnosis not present

## 2016-03-02 DIAGNOSIS — Z7901 Long term (current) use of anticoagulants: Secondary | ICD-10-CM | POA: Diagnosis not present

## 2016-03-02 DIAGNOSIS — N12 Tubulo-interstitial nephritis, not specified as acute or chronic: Secondary | ICD-10-CM | POA: Diagnosis not present

## 2016-03-02 LAB — URINE CULTURE

## 2016-03-02 LAB — GLUCOSE, CAPILLARY
Glucose-Capillary: 185 mg/dL — ABNORMAL HIGH (ref 65–99)
Glucose-Capillary: 352 mg/dL — ABNORMAL HIGH (ref 65–99)

## 2016-03-02 LAB — HEMOGLOBIN A1C
Hgb A1c MFr Bld: 11.7 % — ABNORMAL HIGH (ref 4.8–5.6)
MEAN PLASMA GLUCOSE: 289

## 2016-03-02 MED ORDER — CEPHALEXIN 500 MG PO CAPS: 500.0000 mg | ORAL_CAPSULE | Freq: Four times a day (QID) | ORAL | 0 refills | Status: DC

## 2016-03-02 MED ORDER — CEPHALEXIN 500 MG PO CAPS: 500.0000 mg | ORAL_CAPSULE | Freq: Four times a day (QID) | ORAL | Status: DC

## 2016-03-02 MED ORDER — RIVAROXABAN 15 MG PO TABS: 15.0000 mg | ORAL_TABLET | Freq: Every day | ORAL | 0 refills | Status: DC

## 2016-03-02 NOTE — Discharge Summary (Signed)
Physician Discharge Summary  Michelle Herrera [id number] DOB: [birth year] DOA: 02/29/2016  PCP: Noralee Space, MD  Admit date: 02/29/2016 Discharge date: 03/02/2016  Admitted From: Home Disposition: Home  Recommendations for Outpatient Follow-up:  1. Follow up with PCP in 1 week 2. Patient needs follow-up MRI to assess pancreatic lesions 3. Follow up with cardiology for heart failure  Home Health: None Equipment/Devices: None  Discharge Condition: Stable CODE STATUS: Full code   Brief/Interim Summary:  Admission HPI written by Orson Eva, MD   HPI:  Michelle Herrera is a 83 y.o. female with medical history of atrial fibrillation, hypertension, coronary artery disease with stent in the RCA 2014, diabetes mellitus presenting with chest pain of 2-3 days duration that's located on the left side of her chest below her breast and migrating to the epigastrium and right side of her chest. She denies any fevers, chills, shortness breath, coughing, nausea, vomiting, diarrhea, abdominal pain. There is no dizziness or syncope. The patient states that her chest pain usually last anywhere from 2-5 minutes, but was concerned because it was occurring and higher frequency in the past 24 hours. She states that her chest pain is worsened in the supine position.  In the emergency department, the patient was afebrile hemodynamically stable saturating well on room air. BMP and CBC were essentially unremarkable except for serum glucose of 288. Hepatic enzymes were unremarkable. Chest x-ray showed emphysema and stable cardiomegaly. EKG showed atrial fibrillation with nonspecific ST changes. Urinalysis showed TNTC WBC.    Hospital course:  Chest pain Initial complaint but subsequently characterized as abdominal pain. EKG not suggestive for ACS and troponin was negative x3. Echocardiogram obtained.  Pyelonephritis secondary to E. Coli Urine significant for E. Coli resistant to fluoroquinolones, which  she was on prior to admission for UTI. Patient was treated with ceftriaxone initially as empiric coverage and transitioned to Keflex on discharge to complete a 14 day total course of antibiotics. CT abdomen significant for slight right-sided perinephric stranding.  Atrial fibrillation Rate control. Continued Xarelto, diltiazem and atenolol.  Essential hypertension Chronic and stable. Continued home losartan, atenolol and diltiazem  Diabetes mellitus, type 2 Sliding scale insulin while in the hospital. Continued metformin and glipizide at discharge. Continued gabapentin for neuropathy  Chronic combined systolic and diastolic CHF Euvolemic. Echocardiogram significant for EF of 55% with mild-moderate RV dilation, mild-moderate MR, and severe TR with moderate pulmonary hypertension and severe bilateral atrial enlargement.  Pancreatic lesions Seen on CT scan. Outpatient MRI recommended   Discharge Diagnoses:  Principal Problem:   Pyelonephritis due to Escherichia coli Active Problems:   Essential hypertension   Permanent atrial fibrillation (HCC)   Long term current use of anticoagulant   Chest pain   Chronic combined systolic and diastolic CHF (congestive heart failure) (HCC)   Type 2 diabetes mellitus, uncontrolled, with neuropathy ([place])     Discharge Instructions   Allergies as of 03/02/2016      Reactions   Peanut-containing Drug Products Anaphylaxis, Swelling   Azithromycin Other (See Comments)   REACTION: pt states INTOL to ZPak   Flomax [tamsulosin Hcl] Swelling   Pioglitazone Other (See Comments)   edema   Pregabalin Other (See Comments)   REACTION: pt states INTOL to Lyrica   Sulfonamide Derivatives Swelling      Medication List    STOP taking these medications   ciprofloxacin 250 MG tablet Commonly known as:  CIPRO   fluconazole 150 MG tablet Commonly known as:  DIFLUCAN   furosemide  20 MG tablet Commonly known as:  LASIX   MAGNESIUM-OXIDE 400 (241.3 Mg)  MG tablet Generic drug:  magnesium oxide   traMADol 50 MG tablet Commonly known as:  ULTRAM     TAKE these medications   acetaminophen 500 MG tablet Commonly known as:  TYLENOL Take 500 mg by mouth every 6 (six) hours as needed for mild pain.   atenolol 50 MG tablet Commonly known as:  TENORMIN TAKE ONE TABLET BY MOUTH ONCE DAILY   atorvastatin 40 MG tablet Commonly known as:  LIPITOR TAKE ONE TABLET BY MOUTH ONCE DAILY AT 6PM   cephALEXin 500 MG capsule Commonly known as:  KEFLEX Take 1 capsule (500 mg total) by mouth every 6 (six) hours. Start taking on:  03/03/2016   diltiazem 120 MG 24 hr capsule Commonly known as:  CARTIA XT Take 1 capsule (120 mg total) by mouth daily.   gabapentin 100 MG capsule Commonly known as:  NEURONTIN TAKE ONE CAPSULE BY MOUTH THREE TIMES DAILY   glipiZIDE 10 MG tablet Commonly known as:  GLUCOTROL TAKE TWO TABLETS BY MOUTH TWICE DAILY What changed:  See the new instructions.   losartan 25 MG tablet Commonly known as:  COZAAR Take 25 mg by mouth daily.   metFORMIN 500 MG tablet Commonly known as:  GLUCOPHAGE TAKE TWO TABLETS BY MOUTH TWICE DAILY WITH MEALS What changed:  See the new instructions.   nitroGLYCERIN 0.4 MG SL tablet Commonly known as:  NITROSTAT Place 1 tablet (0.4 mg total) under the tongue every 5 (five) minutes as needed for chest pain (up to 3 doses).   oxybutynin 5 MG tablet Commonly known as:  DITROPAN Take 1 tablet (5 mg total) by mouth 2 (two) times daily.   Rivaroxaban 15 MG Tabs tablet Commonly known as:  XARELTO Take 1 tablet (15 mg total) by mouth daily with supper. What changed:  See the new instructions.   vitamin B-12 1000 MCG tablet Commonly known as:  CYANOCOBALAMIN Take 1,000 mcg by mouth every morning.      Follow-up Information    NADEL,SCOTT M, MD. Schedule an appointment as soon as possible for a visit in 1 week(s).   Specialty:  Pulmonary Disease Why:  Hospital follow-up. Pancreatc  lesion follow-up. Contact information: [place] [address] [phone]          Allergies  Allergen Reactions  . Peanut-Containing Drug Products Anaphylaxis and Swelling  . Azithromycin Other (See Comments)    REACTION: pt states INTOL to ZPak  . Flomax [Tamsulosin Hcl] Swelling  . Pioglitazone Other (See Comments)    edema  . Pregabalin Other (See Comments)    REACTION: pt states INTOL to Lyrica  . Sulfonamide Derivatives Swelling    Consultations:  None   Procedures/Studies: Dg Chest 2 View  Result Date: 02/29/2016 CLINICAL DATA:  Epigastric abdominal pain for 3 days. EXAM: CHEST  2 VIEW COMPARISON:  04/24/2014 FINDINGS: The heart is enlarged but stable. Stable tortuosity of the thoracic aorta. There are chronic lung changes and emphysema but no acute overlying pulmonary findings. The bony thorax is intact. IMPRESSION: Stable cardiac enlargement and chronic lung changes with emphysema but no definite acute overlying pulmonary process. Electronically Signed   By: Marijo Sanes M.D.   On: 02/29/2016 12:27   Ct Abdomen Pelvis W Contrast  Result Date: 03/01/2016 CLINICAL DATA:  83 y/o  F; abdominal pain. EXAM: CT ABDOMEN AND PELVIS WITH CONTRAST TECHNIQUE: Multidetector CT imaging of the abdomen  and pelvis was performed using the standard protocol following bolus administration of intravenous contrast. CONTRAST:  115mL ISOVUE-300 IOPAMIDOL (ISOVUE-300) INJECTION 61% COMPARISON:  03/17/2013 CT of the abdomen and pelvis. FINDINGS: Lower chest: Severe right ventricular and atrial enlargement. Small right-sided Bochdalek hernia containing fat. Hepatobiliary: Stable 6 mm lucency in segment 2 of the liver with peripheral nodular enhancement, probably a small hemangioma. No other focal liver lesion. Status post cholecystectomy. No intra or extrahepatic biliary ductal dilatation. Pancreas: 13 mm lucent lesion in the pancreatic body and 12 mm lucent lesion within the  pancreatic tail (series 7, image 32 and 31) are increased in size in comparison with prior studies. Spleen: Normal in size without focal abnormality. Adrenals/Urinary Tract: Punctate nonobstructing kidney stone in the left kidney interpolar region. Multiple cortical scars are present in the kidneys bilaterally. Cortical hypoenhancement on the delayed phase within the right kidney anterior upper pole extending to posterior interpolar cortex (series 2, image 20 and series 9, image 11 -15). Slight right-sided perinephric stranding. No hydronephrosis. Collapsed bladder. Stomach/Bowel: Stomach is within normal limits. Appendix not identified. No evidence of bowel wall thickening, distention, or inflammatory changes. Extensive sigmoid diverticulosis without evidence for diverticulitis. Vascular/Lymphatic: Aortic atherosclerosis with severe calcification. No enlarged abdominal or pelvic lymph nodes. Reproductive: Uterine myoma.  No adnexal mass. Other: Small inguinal hernias containing fat. Small bilateral femoral hernias containing fat. Musculoskeletal: Mild lumbar dextrocurvature. No acute osseous abnormality. Mild osteoarthrosis of hips. IMPRESSION: 1. Cortical hypoattenuation within the right kidney anterior upper pole extending to posterior interpolar cortex probably representing recent ischemia, possibly infection. Mild right-sided perinephric stranding. 2. Multiple cortical scars in the kidneys bilaterally compatible sequelae of prior ischemia or infection. 3. Punctate left kidney nonobstructing stone.  No hydronephrosis. 4. Severe right ventricular and atrial enlargement. 5. Slowly growing 2 lucent lesions within the pancreas in the body and tail measuring up to 13 mm, possibly side branch IPMN or cystadenoma. Further characterization with abdomen MRI/MRCP with and without contrast is recommended. 6. Extensive sigmoid diverticulosis without evidence of diverticulitis. 7. Severe aortic calcific atherosclerosis. 8.  Small bilateral inguinal and femoral hernias containing fat. Electronically Signed   By: Kristine Garbe M.D.   On: 03/01/2016 17:06     Echocardiogram (03/01/2016)  Impressions:  - The patient was in atrial fibrillation. Normal LV size with EF   55%. Mild to moderate RV dilation with mildly decreased systolic   function. Mild to moderate MR. Severe TR. Moderate pulmonary   hypertension. Severe biatrial enlargement.    Subjective: Patient reports improvement in lower abdominal pain. No dysuria. No chest pain or dyspnea.  Discharge Exam: Vitals:   03/01/16 2032 03/02/16 0658  BP: 109/70 102/62  Pulse: (!) 52 75  Resp: 16 16  Temp: 98.8 F (37.1 C) 98 F (36.7 C)   Vitals:   03/01/16 1006 03/01/16 1348 03/01/16 2032 03/02/16 0658  BP: 135/88 121/70 109/70 102/62  Pulse: 73 68 (!) 52 75  Resp: 17 16 16 16   Temp: 97.6 F (36.4 C) 97.8 F (36.6 C) 98.8 F (37.1 C) 98 F (36.7 C)  TempSrc: Oral Oral Oral Oral  SpO2: 97% 99% 98% 97%  Weight:    53.1 kg (117 lb 1 oz)  Height:        General: Pt is alert, awake, not in acute distress Cardiovascular: RRR, S1/S2 +, no rubs, no gallops Respiratory: CTA bilaterally, no wheezing, no rhonchi Abdominal: Soft, NT, ND, bowel sounds + Extremities: no edema, no cyanosis  The results of significant diagnostics from this hospitalization (including imaging, microbiology, ancillary and laboratory) are listed below for reference.     Microbiology: Recent Results (from the past 240 hour(s))  Urine culture     Status: Abnormal   Collection Time: 02/29/16 11:16 AM  Result Value Ref Range Status   Specimen Description URINE, CLEAN CATCH  Final   Special Requests NONE  Final   Culture >=100,000 COLONIES/mL ESCHERICHIA COLI (A)  Final   Report Status 03/02/2016 FINAL  Final   Organism ID, Bacteria ESCHERICHIA COLI (A)  Final      Susceptibility   Escherichia coli - MIC*    AMPICILLIN 16 INTERMEDIATE Intermediate      CEFAZOLIN <=4 SENSITIVE Sensitive     CEFTRIAXONE <=1 SENSITIVE Sensitive     CIPROFLOXACIN >=4 RESISTANT Resistant     GENTAMICIN <=1 SENSITIVE Sensitive     IMIPENEM <=0.25 SENSITIVE Sensitive     NITROFURANTOIN <=16 SENSITIVE Sensitive     TRIMETH/SULFA <=20 SENSITIVE Sensitive     AMPICILLIN/SULBACTAM 4 SENSITIVE Sensitive     PIP/TAZO <=4 SENSITIVE Sensitive     Extended ESBL NEGATIVE Sensitive     * >=100,000 COLONIES/mL ESCHERICHIA COLI     Labs: BNP (last 3 results) No results for input(s): BNP in the last 8760 hours. Basic Metabolic Panel:  Recent Labs Lab 02/29/16 1040 03/01/16 0509  NA 136 139  K 3.7 4.0  CL 99* 103  CO2 28 28  GLUCOSE 288* 164*  BUN 14 17  CREATININE 0.79 0.76  CALCIUM 9.4 9.1   Liver Function Tests:  Recent Labs Lab 02/29/16 1040  AST 20  ALT 14  ALKPHOS 74  BILITOT 1.6*  PROT 6.8  ALBUMIN 4.1    Recent Labs Lab 02/29/16 1040  LIPASE 34   No results for input(s): AMMONIA in the last 168 hours. CBC:  Recent Labs Lab 02/29/16 1040  WBC 4.8  HGB 13.7  HCT 39.5  MCV 90.6  PLT 171   Cardiac Enzymes:  Recent Labs Lab 02/29/16 1645 02/29/16 2203 03/01/16 0509  TROPONINI <0.03 <0.03 <0.03   BNP: Invalid input(s): POCBNP CBG:  Recent Labs Lab 03/01/16 1236 03/01/16 1648 03/01/16 2030 03/02/16 0744 03/02/16 1150  GLUCAP 147* 267* 207* 185* 352*   D-Dimer No results for input(s): DDIMER in the last 72 hours. Hgb A1c  Recent Labs  02/29/16 1827  HGBA1C 11.7*   Lipid Profile No results for input(s): CHOL, HDL, LDLCALC, TRIG, CHOLHDL, LDLDIRECT in the last 72 hours. Thyroid function studies No results for input(s): TSH, T4TOTAL, T3FREE, THYROIDAB in the last 72 hours.  Invalid input(s): FREET3 Anemia work up No results for input(s): VITAMINB12, FOLATE, FERRITIN, TIBC, IRON, RETICCTPCT in the last 72 hours. Urinalysis    Component Value Date/Time   COLORURINE YELLOW 02/29/2016 1116   APPEARANCEUR  CLOUDY (A) 02/29/2016 1116   LABSPEC 1.011 02/29/2016 1116   PHURINE 5.0 02/29/2016 1116   GLUCOSEU >=500 (A) 02/29/2016 1116   GLUCOSEU NEGATIVE 10/14/2008 1148   HGBUR SMALL (A) 02/29/2016 1116   BILIRUBINUR NEGATIVE 02/29/2016 1116   KETONESUR 20 (A) 02/29/2016 1116   PROTEINUR NEGATIVE 02/29/2016 1116   UROBILINOGEN 0.2 02/05/2011 1117   NITRITE POSITIVE (A) 02/29/2016 1116   LEUKOCYTESUR LARGE (A) 02/29/2016 1116   Sepsis Labs Invalid input(s): PROCALCITONIN,  WBC,  LACTICIDVEN Microbiology Recent Results (from the past 240 hour(s))  Urine culture     Status: Abnormal   Collection Time: 02/29/16 11:16 AM  Result Value  Ref Range Status   Specimen Description URINE, CLEAN CATCH  Final   Special Requests NONE  Final   Culture >=100,000 COLONIES/mL ESCHERICHIA COLI (A)  Final   Report Status 03/02/2016 FINAL  Final   Organism ID, Bacteria ESCHERICHIA COLI (A)  Final      Susceptibility   Escherichia coli - MIC*    AMPICILLIN 16 INTERMEDIATE Intermediate     CEFAZOLIN <=4 SENSITIVE Sensitive     CEFTRIAXONE <=1 SENSITIVE Sensitive     CIPROFLOXACIN >=4 RESISTANT Resistant     GENTAMICIN <=1 SENSITIVE Sensitive     IMIPENEM <=0.25 SENSITIVE Sensitive     NITROFURANTOIN <=16 SENSITIVE Sensitive     TRIMETH/SULFA <=20 SENSITIVE Sensitive     AMPICILLIN/SULBACTAM 4 SENSITIVE Sensitive     PIP/TAZO <=4 SENSITIVE Sensitive     Extended ESBL NEGATIVE Sensitive     * >=100,000 COLONIES/mL ESCHERICHIA COLI     Time coordinating discharge: Over 30 minutes  SIGNED:   Cordelia Poche, MD [facility] 03/02/2016, 1:49 PM Pager [phone]  If 7PM-7AM, please contact night-coverage [email or web address] Password [email or web address]

## 2016-03-02 NOTE — Discharge Instructions (Signed)
Michelle Herrera,  You were treated for a urinary tract infection which appears to have affected the kidneys. This is called Pyelonephritis. You will need antibiotics for two weeks total. On your scan, there were some lesions on your pancreas. These will need to be followed up by your primary care physician with an MRI. Please follow-up with your primary care physician.

## 2016-03-02 NOTE — Care Management Note (Signed)
Case Management Note  Patient Details  Name: Michelle Herrera MRN: [id number] Date of Birth: [birth year]  Subjective/Objective:   Pt admitted with cco Chest Pain                 Action/Plan: Plan to discharge home with no needs  Expected Discharge Date:  03/02/16               Expected Discharge Plan:     In-House Referral:     Discharge planning Services     Post Acute Care Choice:    Choice offered to:     DME Arranged:    DME Agency:     HH Arranged:    Sun Agency:     Status of Service:     If discussed at [employer], dates discussed:    Additional CommentsPurcell Mouton, RN 03/02/2016, 3:03 PM

## 2016-03-10 ENCOUNTER — Encounter: Payer: Self-pay | Admitting: Pulmonary Disease

## 2016-03-10 ENCOUNTER — Ambulatory Visit (INDEPENDENT_AMBULATORY_CARE_PROVIDER_SITE_OTHER): Payer: Medicare Other | Admitting: Pulmonary Disease

## 2016-03-10 VITALS — BP 134/72 | HR 74 | Temp 97.0°F | Ht 65.0 in | Wt 120.2 lb

## 2016-03-10 DIAGNOSIS — I2510 Atherosclerotic heart disease of native coronary artery without angina pectoris: Secondary | ICD-10-CM | POA: Diagnosis not present

## 2016-03-10 DIAGNOSIS — I5042 Chronic combined systolic (congestive) and diastolic (congestive) heart failure: Secondary | ICD-10-CM | POA: Diagnosis not present

## 2016-03-10 DIAGNOSIS — K449 Diaphragmatic hernia without obstruction or gangrene: Secondary | ICD-10-CM

## 2016-03-10 DIAGNOSIS — K869 Disease of pancreas, unspecified: Secondary | ICD-10-CM | POA: Diagnosis not present

## 2016-03-10 DIAGNOSIS — M159 Polyosteoarthritis, unspecified: Secondary | ICD-10-CM

## 2016-03-10 DIAGNOSIS — E1140 Type 2 diabetes mellitus with diabetic neuropathy, unspecified: Secondary | ICD-10-CM

## 2016-03-10 DIAGNOSIS — IMO0002 Reserved for concepts with insufficient information to code with codable children: Secondary | ICD-10-CM

## 2016-03-10 DIAGNOSIS — E1165 Type 2 diabetes mellitus with hyperglycemia: Secondary | ICD-10-CM

## 2016-03-10 DIAGNOSIS — F411 Generalized anxiety disorder: Secondary | ICD-10-CM

## 2016-03-10 DIAGNOSIS — N12 Tubulo-interstitial nephritis, not specified as acute or chronic: Secondary | ICD-10-CM

## 2016-03-10 DIAGNOSIS — I10 Essential (primary) hypertension: Secondary | ICD-10-CM | POA: Diagnosis not present

## 2016-03-10 DIAGNOSIS — N3946 Mixed incontinence: Secondary | ICD-10-CM

## 2016-03-10 DIAGNOSIS — I4821 Permanent atrial fibrillation: Secondary | ICD-10-CM

## 2016-03-10 DIAGNOSIS — B9620 Unspecified Escherichia coli [E. coli] as the cause of diseases classified elsewhere: Secondary | ICD-10-CM

## 2016-03-10 DIAGNOSIS — M150 Primary generalized (osteo)arthritis: Secondary | ICD-10-CM

## 2016-03-10 DIAGNOSIS — I482 Chronic atrial fibrillation: Secondary | ICD-10-CM

## 2016-03-10 MED ORDER — FAMOTIDINE 20 MG PO TABS: 20.0000 mg | ORAL_TABLET | Freq: Every day | ORAL | 0 refills | Status: DC

## 2016-03-10 MED ORDER — OMEPRAZOLE 20 MG PO CPDR: DELAYED_RELEASE_CAPSULE | ORAL | 11 refills | Status: DC

## 2016-03-10 NOTE — Patient Instructions (Signed)
Today we updated your med list in our EPIC system...    Continue your current medications the same...  We will sched an MRI scan of your abdomen to check your pancreas...    We will contact you w/ the results when available...   I want you to start 2 new OTC meds>    1) PRILOSEC 20mg  - take 1 tab about 30 min before breakfast each AM..[name]    2) PEPCID 20mg  - take one tab before bedtime each day...  We will contract the [facility] regarding any XARELTO samples vs placing you on COUMADIN therapy if samples are no longer available...  Call for any questions...  Let's plan a follow up visit in 51mo, sooner if needed for problems.Marland KitchenMarland Kitchen

## 2016-03-10 NOTE — Progress Notes (Addendum)
Subjective:    Patient ID: Michelle Herrera, female    DOB: 1930/04/08, 83 y.o.   MRN: [id number]  HPI 83 y/o WF here for a follow up visit...  SEE PREV EPIC NOTES FOR OLDER DATA >>      Adm 1/13 w/ renal infarcts felt due to micro-emboli & inadeq INR ==> changed to [place].  Hosp 5/14 for NSTEMI=> stent to RCA.  LABS 10/14:  FLP- looks good on Lip40;  Chems- ok x BS=181, A1c=8.8.Marland KitchenMarland Kitchen  Hosp 3/15 w/ NSTEMI & cath showed widely patent coronaries & EF=50%.  LABS 4/15:  Chems- ok x BS=181, A1c=9.3;  LFTs- wnl;  CBC- wnl;  Fe=121 (31%sat);    2DEcho 05/2013 showed mild LVH, EF=50-55%, mild AI, bileaflet MVP w/ mod MR, severe biatrial enlargement, PFO suspected, severe TR, PAsys=71mHg  ADDENDUM>> she notes that she has no appetite & "not eating"; she has lost 21# in 628moown to 109# & BMI=18; but serum proteins WNL; we will treat w/ MEGACE 20015mefore meals Tid... ~  October 23, 2013:  Michelle Herrera decreased both of her DM meds in half as she says she doesn't tolerate the max doses due to "feeling bad, GI upset, & diarrhea";  She has DM w/ neuropathy> prev on Metform500-2Bid & Glipiz10-2Bid but INTOL she says & decreased to 2 of each Qam only; prev followed by DrEllison but last seen 6/13- Actos stopped due to edema; won't take additional meds due to cost; refuses insulin shots; she claims that BS higher when her knee is hurting; Labs 10/15 showed BS=172, A1c=8.4 & asked to ADD Metform500 & Glipiz10 (one of each) in PM at dinner => NOTE: she did NOT incr the Metform & glipizide as requested...   ~  April 24, 2014:  42mo62mo Michelle Herrera she's doing well- CC is hip pain & she notes that Tylenol helps;     HBP, CAD, cardiomyop (sys & diast CHF), MR> on Aten50, Cardizem120, Lasix20-2/d; BP= 110/70 & she reports doing well- no CP etc...    PAF, CHB, pacer> followed by DrTaylor on Xarelto15 (decreased due to epistaxis); she reports stable- no CP, palpit, dizzy, nose bleeds etc...    Chol> on Lip40;  FLP 4/16 showed TChol 214, TG 106, HDL 86, LDL 107... We reviewed diet, continue same med.    DM> on Metform500-2Qam & Glucotrol10-2Qam; compliance is ?? She has been reluctant to follow my recs for increased meds; Labs 4/16 showed BS=175, A1c=9.4 => refer to [facility] for DM management.    Other problems as listed> on Neurontin100Tid, MVI, B12-1000/d... We reviewed prob list, meds, xrays and labs> see below for updates >>   CXR 4/16 showed mod cardiomeg, hyperinflation, sl blunt angles c/w pleural thickening, DJD Tspine, osteopenia, NAD...  LABS 4/16:  FLP- note quite at goals;  Chems- ok x BS=175, A1c=9.4;  CBC- wnl;  TSH=4.61;  BNP=438...  ADDENDUM>> she REFUSED referral to Endocrinology for DM mamagement, refuses to increase her DM meds per my recommendations, refuses to start insulin therapy; she says that she will continue current meds and get on diet + exercise to get her BS improved & A1c better...  ~  October 24, 2014:  Michelle Herrera a quiet interval, notes some LBP & this is helped by Tramadol 1/2 tab + Tylenol...    HBP, CAD, cardiomyop (sys & diast CHF), MR> on Aten50, Cardizem120, Lasix20-2/d; BP= 138/64 & she reports doing well- no CP etc; she needs incr exercise...    PAF,  CHB, pacer> followed by DrTaylor on Xarelto15/d (decreased due to epistaxis); she reports stable- no CP, palpit, dizzy, nose bleeds etc...    Chol> on Lip40; FLP 4/16 showed TChol 214, TG 106, HDL 86, LDL 107... We reviewed diet, continue same med.    DM> on Metform500-2Qam & Glucotrol10-2Qam; compliance is ?? She has been reluctant to follow my recs for increased meds; Labs 4/16 showed BS=175, A1c=9.4 => refer to [facility] for DM management (she refused again).    Other problems as listed> on Neurontin100Tid, MVI, B12-1000/d... We reviewed prob list, meds, xrays and labs>  IMP/PLANS>>  Reminded to take meds regularly & again asked to bring all med bottles to every office visit; she continues to refuse to  increase her DM meds or go to an endocrinologist for DM management; Her CC is the back pain but Tramadol50 makes her too groggy- rec to take 1/2 tab + Tylenol, up to Tid as needed...   ~  April 23, 2015:  [place] a [place]15- developed some bleeding that wouldn't stop & went to ER (she is on Xarelto- held that one day); treated w/ gauze & a tea-bag w/ control...     She had f/u [facility]- DrTaylor 12/18/14> note reviewed- chr AFib, HBP, CAD s/p stent to RCA 2014, combined sys & diast CHF, valv dis w/ mod MR/ severe TR/ and mod PulmHTN (567mg); no CP, chrDOE (class2); Same meds- on Xarelto, Aten50, Cardizem120, Lasix40, & no changes...     HBP> on Aten50, Diltiazem120 & Lasix20-2Qam; BP= 138/86 & she denies CP/ ch in SOB/ edema, notes occas palpit...    CAD, Cardiomyopathy (sys & diast CHF), Valvular Dis w/ MR etc> off ASA81 due to epistaxis, on Xarelto15 (lowered dose due to nose bleed) & above meds; Adm 5/14 by [facility] w/ NSTEMI, Cath=> stent to 95%RCA; followed by DrTaylor; Adm 1/15 & 3/15 w/ AFib, RVR, NSTEMI due to demand ischemia and recath w/ widely patent coronaries, EF=50%; meds adjusted...    AFib, CHB & Pacer> on Xarelto at 15 (due to epistaxis); followed by DrTaylor & seen 12/16> cardiology notes are reviewed...    Chol> on Lip40; weight is up to 128#, BMI=21; FLP 4/16 shows TChol 214, TG 106, HDL 86, LDL 107 & reminded to take med every day!    DM w/ neuropathy> on Metform500Bid & Glipiz10-2Bid but ?what she is taking (didn't brink bottles); prev followed by DrEllison- Actos stopped due to edema; won't take additional meds due to cost; refuses insulin shots; she claims that BS higher when her knee is hurting; Labs 4/16 showed BS=175, A1c=9.4;  Labs 4/17 showed BS=268, A1c=10.6;  I confirmed w/ [facility] ([name]) that pt is NOT filling meds regularly (Metform= 04/04/15 for 30d supply & 01/20/15 prior to that;  Glipizide= 02/26/15 for 30d supply & 12/11/14  prior to that);  We will again try to refer to Endocrine (she has refused mult times).    GI- HH, colon polyps, s/p GB> stable she says, not on regular PPI dosing, last colon was 2003 by DrStark w/ 37m67molyp & divertics; denies abd pain, n/v, d/c, blood seen...    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     DJD, LBP, right shoulder pain> on Neurontin100Tid; Ortho evals from  DrNorris/ [place]- prev knee surg for torn cartilage, back discomfort treated w/ Tramadol & Tylenol...    Osteoporosis, Vit D defic> Vit D level 2013 = 30 and she was asked to incr her OTC Vit D supplement to 2000u daily; needs f/u BMD but she wants to wait...    Memory Loss> Hx 2 sm infarcts on prev MRI, on Xarelto from [facility]; she declines Aricept etc...    Anxiety> mod stress in the family, she declines anxiolytic meds...    B12 defic> on OTC oral B12 supplement 1000u daily...    Medication non-compliance>  This is well documented at each & every office visit... EXAM shows Afeb, VSS, O2sat=98% on RA;  HEENT- dental problems, mallampati1;  Chest- clear w/o w/r/r;  Heart- Irreg Afib, gr1-2/6 DEM w/o r/g heard;  Abd- soft, nontender, neg;  Ext- VI, w/o c/c/e;  Neuro- intact w/o focal neuro deficits...  LABS 04/23/15>  FLP- not at goals, not taking meds regularly;  Chems- ok x BS=268, A1c=10.6;  CBC- wnl;  Thyroid=3.51;  Reminded to take all meds every day! IMP/PLAN>>  Arayiah is still NOT taking her meds regularly- asked to fill all Rx every month & take all meds as prescribed every day!  We will attempt to get her to see DrGherghe at [facility] (she once again refused...   ~  October 26, 2015:  39mo ROV & Michelle Herrera reports that she is doing well, feeling good, & no new complaints or concerns;  She denies CP,  palpit, edema; states her breathing is OK & denies cough, sput, SOB; it is apparent that she is still NOT exercising at all... we reviewed the following medical problems during today's office visit >>     BP & cardiac controlled w/ Aten50, [id number], Lasix40, Xarelto15; BP=124/82 today & she remains asymptomatic...    Chol treated w/ diet + Lip40 but compliance is questioned; she did not bring pill bottles or med list to todays OV; [place] 4/17 showed TChol 216, TG 115, HDL 80, LDL 113 & reminded to take med every day...    DM treated w/ Metform + Glipizide but she doesn't take what is prescribed- admits to taking Metform500-2Qam & Glipiz10-2Qam but no PM doses; Labs today showed     GI/GU- stable, she wears depends & take Ditropan due to her incont but this system is working satis for her...    She has diffuse DJD, LBP, osteoporosis etc; on Neurontin, Tramadol, Tylenol prn... EXAM shows Afeb, VSS, O2sat=97% on RA;  HEENT- dental problems, mallampati1;  Chest- clear w/o w/r/r;  Heart- Irreg Afib, gr1-2/6 DEM w/o r/g heard;  Abd- soft, nontender, neg;  Ext- VI, w/o c/c/e;  Neuro- intact w/o focal neuro deficits...  LABS 10/26/15>  Chems- ok x BS=296, A1c=10.6 IMP/PLAN>  Michelle Herrera continues to treat herself by taking the meds as she wants & not as prescribed; she has also tied my hands as she will not take any of the newed (more expensive) DM meds and wil not agree to insulin rx; finally I have tried to refer her to endocrine/ DM specialist but she has repeatedly refused to go; we reviewed low carb/ DM diet & rec to take the meds as follows- Metform500- 2Qam & 1Qpm plus the Glipizide10-2Qam & 1Qpm as well... She has again declined my offer to send her to a DM specialist for their review... Note: >50% of this 25 min appt was spent in counseling & coordination of care.   ~  March 10, 2016:  4-45mo ROV & Michelle Herrera was [facility] 2/19 - 03/02/16 by [facility] w/ left CP radiating to the epigastrium & worse supine; ER eval revealed EKG  w/ Afib & NSSTTWA, CXR w/ cardiomeg & some hyperinflation w/ chr changes, LABS ok x BS=288 & troponins neg, UTI (?pyelo) w/ tntc WBCs=> EColi resist to Quinolones (Rx Rocephin=>Keflex);  CT Abd revealed mult findings (see below) + 2 lucent lesions in the pancreas up to 72mm size & [facility] did not evaluate further, rather they disch pt to get the needed MRI Abd as an outpt after discharge...     Pt was last seen by [facility], DrTaylor 10/14/15>  HBP, chr AFib, CAD- s/p stent in RCA 2014;  Last 2DEcho in 2015 revealed numerous abnormalities (EF=50-55%, valvular heart dis, severe biatrial enlargement?PFO, severe TR & PAsys=7mmHg);  They did not rec any changes to meds & asked to f/u 45yr... NOTE: pt is on Xarelto15 per DrTaylor but she has never purchased this med- she only takes it if she get samples from the [facility] office    BP & cardiac treated w/ Aten50, [id number], Losar25, Xarelto15, she is off the Lasix; BP=134/72 today & she notes the chest discomfort, SOB/DOE, denies edema...    Chol treated w/ diet + Lip40 but compliance is questioned; she did not bring pill bottles or med list to todays OV; [place] 4/17 showed TChol 216, TG 115, HDL 80, LDL 113 & reminded to take med every day...    DM treated w/ Metform1000Bid + Glipizide10-2Bid but she doesn't take what is prescribed- admits to taking Metform500-2Qam & Glipiz10-2Qam but no PM doses; she refuses Endocrine referral, refuses to change meds or take insulin shots; LABS 02/2016 showed BS=164, A1c=11.7 AND SHE AGAIN REFUSES ALL INTERVENTIONS on her behalf...    GI/GU- stable, she wears depends & take Ditropan due to her incont but this system is working satis for her...    She has diffuse DJD, LBP, osteoporosis etc; on Neurontin, Tramadol, Tylenol prn... EXAM shows Afeb, VSS, O2sat=99% on RA;  HEENT- dental problems, mallampati1;  Chest- clear w/o w/r/r;  Heart- Irreg Afib, gr1-2/6 SEM w/o r/g heard;  Abd- soft, nontender, neg;  Ext- VI, w/o c/c/e;  Neuro- intact w/o  focal neuro deficits...  CXR 02/29/16 (independently reviewed by me in the PACS system) showed stable cardiomeg, tortuous Ao, chronic lung changes- NAD.Marland KitchenMarland Kitchen  EKG 02/29/16 showed Afib, [id number], RAD, NSSTTWA...  CT Abd&Pelvis 03/01/16 showed cortical hypoattenuation of the right kidney, mult cortical scars bilaterally (likely prior ischemia or infection), punctate nonobstructing left renal stone; 2 lucent lesions in pancreas body & tail measuring up to 54mm=> rec MRIw/ contrast; extensive divertics, severe aortic calcif, severe right ventric & atrial enlargement,  smal bilat inguinal & femoral hernias containing fat...  2DEcho 03/01/16> AFib, norm LV size w/ EF=55% & no RWMA, modMR, severeTR, severe biatrial enlargement, mod RV dilation w/ decr sys function & mod pulmHTN w/ PAsys=60mmHg...  LABS 02/2016>  Chems- ok x BS=164, A1c=11.7;  CBC- wnl x wbc=14.7;  UTI w/ EColi resist to Cipro, sens to keflex... IMP/PLAN>>  Michelle Herrera has serious multisystem dis- CARDIAC, Pulm, DM, GI/GU, etc; she is her own worst enemy as she won't take meds regularly, refuses additional consults eg-Endocruine/DM, refuses to change her meds or consider insulin shots,  Most recent Hosp for CP=> Abd pain showed abnormal 2DEcho & abn CT Abd as noted;  Radiology & [facility] hospitalists want [place] to order MRI abd w/ contrast to f/u on the pancreatic lesions & we will  comply; Pt rec to take Prilosec & Pepcid...  ADDENDUM>>  MRI Abd, MRCP w/ contrast>>  Tiny benign appearing cystic lesions in the pancreas, largest=82mm size in the uncinate process; incidental 1cm left adrenal adenoma, no suspicious renal lesions, atherosclerosis of the Ao, no lymphadenopathy...  Pt informed that this looks benign...  NOTE: Pt is sure her abd discomfort is coming from her DM meds => needs Endocrine consult for DM management...           Problem List:     HYPERTENSION >> VALVULAR HEART DISEASE >> ATRIAL FIBRILLATION - stable on Rx w/ XARELTO & rate control  strategy. ~  4/11:  she reports that she stopped her Lanoxin 0.125 on her own & doesn't want to restart. ~  8/11:  f/u DrTaylor & stable on Coumadin w/ rate control strategy, no changes made. ~  11/11:  Hosp by Neuro w/ TIA> 2DEcho showed mild LVH, diffuse HK w/ EF= 45%, thick pos MV leaflet w/ restrict motion & modMR, dilated LA&RA, severeTR & PAsys=49, can't r/o PFO... ~  8/12:  her BP=116/84 and she feels OK- denies HA, fatigue, visual changes, CP, palipit, dizziness, syncope, dyspnea, edema, etc... ~  12/12:  BP= 126/88 and she remains asymptomatic as above... ~  1/13:  2DEcho showed mild LVH w/ EF= 40-45% & diffuse HK, Diastolic dysfunction, modMR, severe LAE, modTR, Pasys=45 ~  She was switched from Coumadin to [facility] 1/13 due to the renal infarcts & difficulty maintaining INR. ~  CXR 1/13 showed marked cardiomegaly, pulm vasc congestion, NAD... ~  4/13:  BP= 128/60 and she is feeling better she says;  BUN=15, Creat=0.8; She had f/u DrTaylor> noted to be doing well w/o symptoms- continue same meds & low Na. ~  EKG 4/13 showed AFib, rate 77, right ward axis & NSSTTWA... ~  8/13:  BP= 110/82 and she denies current CP, palpit, SOB, edema... ~  4/14: on Aten50Bid; BP= 130/84 & she denies CP/ SOB/ edema, notes occas palpit. ~  5/14:  Adm by [facility]> NSTEMI, Cath w/ LAD minor irreg only, LCirc w/ ostial 40-50%, mid RCA 95%, EF 55%. PCI: Vision BMS to the mid RCA. There was moderate MR on left ventriculogram;  ~  CXR 5/14 showed cardiomeg, underlying COPD w/ apical scarring, DJD in spine, NAD... ~  EKG 6/14 showed AFib, rate72, NSSTTWA...  ~  2DEcho 6/14 showed norm LV size & funct w/ EF=55%, mild MR, severe RA&LA dil, modTR,  ~  Adm 1/15 & 3/15 w/ CP, AFib w/ RVR, had NSTEMI 3/15 from demand ischemia; cath w/ patent coronaries, EF=50%; meds adjusted w/ stop ASA & decr Xarelto to 15mg  due to severe epistaxis... ~  CXR 3/15 showed cardiomegaly, hyperinflated lungs, biapical pleural thickening,  NAD... ~  EKG 3/15 showed AFib, rate74, NSSTTWA... ~  4/15: on Aten50Bid, Diltiazem120, & Lasix20; also taking Xarelto15; BP= 110/64 & she denies CP/ SOB/ edema, notes occas palpit... ~  10/15:  on Aten50Bid, Diltiazem120,  & Lasix20-2Qam, Xarelto15; BP= 110/64 & she claims asymptomatic... ~  4/16: on Aten50, Cardizem120, Lasix20-2/d, Xarelto15; she reports doing well- no CP etc; BP= 110/70... ~  CXR 4/16 showed mod cardiomeg, hyperinflation, sl blunt angles c/w pleural thickening, DJD Tspine, osteopenia, NAD.  HYPERCHOLESTEROLEMIA - on diet + SIMVASTATIN 40mg /d... ~  [place] 2007 showed TChol 160, TG 143, HDL 55, LDL 76 ~  FLP 7/09 showed TChol 165, TG 129, HDL 60, LDL 80 ~  FLP 10/10 showed TChol 175, TG 68, HDL 80, LDL  82 ~  FLP 4/11 showed TChol 207, TG 104, HDL 84, LDL 111... rec better diet, continue same med. ~  FLP 11/11 on Simva40 showed TChol 180, TG 71, HDL 67, LDL 99 ~  FLP 8/12 on Simva40 showed TChol 196, TG 96, HDL 89, LDL 88 ~  FLP 4/13 on Simva40 showed TChol 211, TG 77, HDL 85, LDL 112... rec better diet, same med. ~  FLP 10/14 on Lip40 showed TChol 138, TG 63, HDL 65, LDL 61  ~  FLP 10/15 on Lip40 showed  TChol 127, TG 54, HDL 69, LDL 47 ~  FLP 4/16 on Lip40 showed TChol 214, TG 106, HDL 86, LDL 107   TYPE 2 DIABETES MELLITUS, Uncontrolled, w/ Neuropathy >>  ~  on METFORMIN 500mg - 2tabsBid, GLIPZIDE 10mg Bid (not taking Januvia due to $$)... ~  labs 1/09 showed BS= 148, HgA1c= 7.1.Marland KitchenMarland Kitchen continue meds + better diet... ~  labs 7/09 showed BS= 168, HgA1c= 7.6.Marland KitchenMarland Kitchen incr Metform 2Bid, contin Glip 10Bid & Januv... ~  she stopped the Januvia due to cost... ~  labs 4/10 showed BS= 185, Aic= 8.0.Marland KitchenMarland Kitchen reminder to take meds regularly. ~  labs 10/10 on Metform2Bid+Glip10Bid showed BS= 124, A1c= 6.7 ~  labs 4/11 showed BS= 162, A1c= 6.8.[name]. rec> better diet, take meds regularly. ~  labs 10/11 showed BS= 322, A1c= 8.3.Marland KitchenMarland Kitchen Pharm confirms not taking meds regularly!!! Discussed w/ pt... ~  Labs 4/12  showed BS= 91, A1c= 7.9.Marland KitchenMarland Kitchen Rec> keep same meds, take regularly, better low carb diet... ~  Labs 8/12 on Metform2Bid+Glip10Bid showed BS= 114, A1c= 7.3.Marland KitchenMarland Kitchen Continue same... ~  Labs in hosp 1/13 showed BS~ 153-209 & A1c=7.4... NOTE: she has refused other meds due to $$$ ~  Labs 4/13 on Metform2Bid+Glipiz10Bid showed BS= 208, A1c= 8.6... She is referred to Endocrinology ~  She saw DrEllison but refused Actos, refused Gliptins, etc... ~  Labs 8/13 on Metform2Bid+Glipiz10Bid showed BS= 163, A1c= 7.5.Marland KitchenMarland Kitchen Continue same + better diet... ~  Labs 10/14 on Metform2Bid+Glipiz10Bid showed BS= 181, A1c= 8.8.Marland KitchenMarland Kitchen rec better diet & incr Glipiz10-2Bid... ~  Labs 4/15 on Metform500-2Bid & Glipizide10-2Bid showed BS= 181, A1c= 9.3; she refuses additional meds; she cut back both meds to just Qam due to "side effects"... ~  Labs 10/15 on Metform500-2Qam & Glipiz10-2Qam showed BS= 172, A1c= 8.4 and REC to add one of each at dinner in the PM...  ~  Labs 4/16 on Metform500-2Qam & Glipiz10-2Qam showed BS=175, A1c=9.4 (compliance is ?? She has been reluctant to follow my recs for increased meds) => refer to [facility] for DM management.  HIATAL HERNIA (ICD-553.3) - last EGD by DrStark was 3/03 and showed a HH, reflux...  COLONIC POLYPS (ICD-211.3) - last colonoscopy 3/03 by DrStark showed divertics and 42mm polyp (no path avail).  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 pt reports that there is nothing they can do to help... ~  she has had several UTI's... then perineal rash & saw GYN= neg PAP & Rx yeast infection- resolved. ~  She will f/u w/ Urology for persistant symptoms, seen by DrEskridge w/ trial Myrbetriq but not much benefit per pt... ~  7/15: referred to DrEskridge by Ortho after MRI spine showed ?bilat hydro; reviewed by Urology & they disagreed- no hydro but she has hx renal infarct 1/13, bilat extrarenal pelvis,  & left peri-pelvic cysts, small bladder capacity, known cystocele...  DEGENERATIVE JOINT DISEASE (ICD-715.90) - she needs right TKR but  wants to put this off as long as poss... ~  4/12:  Ortho eval by DrNorris, hx prev knee arthroscopies, given Cortisone shot, & he plans ?Synvisc series... ~  9/12:  outpt knee surg to repair torn cartilage per DrNorris; preop clearance by DrTaylor & [facility]in [facility]...  BACK PAIN, LUMBAR (ICD-724.2) - XRays showed scoliosis, facet degen arthritis, & osteopenia...  she has used ROBAXIN Prn... ~  4/12:  She reports eval from DrNorris w/ "arthritis all down my backbone" & he rec epid steroid injection, but she reports improved on NEURONTIN 100mg  Tid... ~  4/13:  She reports that back pain has improved but she still has some leg pain but notes that "it's tolerable"...  SHOULDER PAIN, RIGHT (ICD-719.41) - prev eval & Rx by DrRendall... s/p right shoulder surg 3/09... XRays showed calcium deposit, rotator cuff prob, & intol to Tramadol... ~  She tells me she wants to f/u w/ DrWhitfield for Ortho...  OSTEOPOROSIS (ICD-733.00) ~  labs 10/10 w/ Vit D level = 32... rec> start Vit D OTC 1000 u daily. ~  Labs 4/13 showed Vit D level = 30... rec to incr to 2000u daily...  MEMORY LOSS (ICD-780.93) - concern for her memory expressed 7/09 OV- tried Aricept but she stopped it- "no different". TIA/ INFARCT - she was Hosp 11/11 by DrWillis w/ left sided weakness, ?left facial droop, & MRI showed 2 sm infarcts on right (frontal & ?pontine);  MRA was neg;  CDopplers were neg;  Deficits all cleared rapidly;  2DEcho was abn- see above;  She was continued on her Coumadin Rx- no changes made... ~  4/13:  She was offered memory medications but she declined...  ANXIETY (ICD-300.00) - stress in family... 2 y/o granddau w/ seizures...  VITAMIN B12 DEFICIENCY >> on OTC Vit B-12 tabs orally> 1027mcg/d... ~  Labs 7/11 showed Vit V12 level = 135... Started on Vit B12 supplement w/  1042mcg/d... ~  Labs 4/13 showed Vit B12 level = 905... Ok top decr to 1/2 tab daily...   Past Surgical History:  Procedure Laterality Date  . cataract surgery/both eyes     01/2012 left eye---02/2012 right eye  . CHOLECYSTECTOMY    . LEFT HEART CATHETERIZATION WITH CORONARY ANGIOGRAM N/A 05/28/2012   Procedure: LEFT HEART CATHETERIZATION WITH CORONARY ANGIOGRAM;  Surgeon: Sherren Mocha, MD;  Location: [facility] CATH LAB;  Service: Cardiovascular;  Laterality: N/A;  . LEFT HEART CATHETERIZATION WITH CORONARY ANGIOGRAM N/A 03/14/2013   Procedure: LEFT HEART CATHETERIZATION WITH CORONARY ANGIOGRAM;  Surgeon: Sinclair Grooms, MD;  Location: [facility] CATH LAB;  Service: Cardiovascular;  Laterality: N/A;  . PERCUTANEOUS CORONARY STENT INTERVENTION (PCI-S)  05/28/2012   Procedure: PERCUTANEOUS CORONARY STENT INTERVENTION (PCI-S);  Surgeon: Sherren Mocha, MD;  Location: [facility] CATH LAB;  Service: Cardiovascular;;  . rt.leg surgery  11/2010    Outpatient Encounter Prescriptions as of 03/10/2016  Medication Sig Dispense Refill  . acetaminophen (TYLENOL) 500 MG tablet Take 500 mg by mouth every 6 (six) hours as needed for mild pain.    [name] atenolol (TENORMIN) 50 MG tablet TAKE ONE TABLET BY MOUTH ONCE DAILY 90 tablet 3  . atorvastatin (LIPITOR) 40 MG tablet TAKE ONE TABLET BY MOUTH ONCE DAILY AT 6PM 30 tablet 6  . diltiazem (CARTIA XT) 120 MG 24 hr capsule Take 1 capsule (120 mg total) by mouth daily. 90 capsule 2  . gabapentin (NEURONTIN) 100 MG capsule TAKE ONE CAPSULE BY MOUTH THREE TIMES DAILY 90 capsule 2  . glipiZIDE (GLUCOTROL)  10 MG tablet TAKE TWO TABLETS BY MOUTH TWICE DAILY (Patient taking differently: TAKE TWO TABLETS in the morning and 1 at dinner) 120 tablet 2  . losartan (COZAAR) 25 MG tablet Take 25 mg by mouth daily.    . metFORMIN (GLUCOPHAGE) 500 MG tablet TAKE TWO TABLETS BY MOUTH TWICE DAILY WITH MEALS (Patient taking differently: TAKE TWO TABLETS in the morning and 1 tablet at dinner) 120 tablet 3   . nitroGLYCERIN (NITROSTAT) 0.4 MG SL tablet Place 1 tablet (0.4 mg total) under the tongue every 5 (five) minutes as needed for chest pain (up to 3 doses). 25 tablet 3  . oxybutynin (DITROPAN) 5 MG tablet Take 1 tablet (5 mg total) by mouth 2 (two) times daily. 60 tablet prn  . Rivaroxaban (XARELTO) 15 MG TABS tablet Take 1 tablet (15 mg total) by mouth daily with supper. 30 tablet 0  . vitamin B-12 (CYANOCOBALAMIN) 1000 MCG tablet Take 1,000 mcg by mouth every morning.     . famotidine (PEPCID) 20 MG tablet Take 1 tablet (20 mg total) by mouth at bedtime. 30 tablet 0  . omeprazole (PRILOSEC) 20 MG capsule Take 1 tablet by mouth 30 minutes before breakfast 30 capsule 11  . [DISCONTINUED] cephALEXin (KEFLEX) 500 MG capsule Take 1 capsule (500 mg total) by mouth every 6 (six) hours. (Patient not taking: Reported on 03/10/2016) 44 capsule 0   No facility-administered encounter medications on file as of 03/10/2016.      Allergies  Allergen Reactions  . Peanut-Containing Drug Products Anaphylaxis and Swelling  . Azithromycin Other (See Comments)    REACTION: pt states INTOL to ZPak  . Flomax [Tamsulosin Hcl] Swelling  . Pioglitazone Other (See Comments)    edema  . Pregabalin Other (See Comments)    REACTION: pt states INTOL to Lyrica  . Sulfonamide Derivatives Swelling    Review of Systems        See HPI - all other systems neg except as noted... The patient complains of weight loss, chest pain, dyspnea on exertion, and muscle weakness.  The patient denies anorexia, fever, weight gain, vision loss, decreased hearing, hoarseness, syncope, peripheral edema, prolonged cough, headaches, hemoptysis, abdominal pain, melena, hematochezia, severe indigestion/heartburn, hematuria, incontinence, suspicious skin lesions, transient blindness, difficulty walking, depression, unusual weight change, abnormal bleeding, enlarged lymph nodes, and angioedema.     Objective:   Physical Exam      WD, WN, 83  y/o WF in NAD...  GENERAL:  Alert & oriented; pleasant & cooperative... HEENT:  [place]/AT, EOM-wnl, PERRLA, EACs-clear, TMs-wnl, NOSE-clear, THROAT-clear & wnl. NECK:  Supple w/ fairROM; no JVD; normal carotid impulses w/o bruits; no thyromegaly or nodules palpated; no lymphadenopathy. CHEST:  Clear to P & A; without wheezes/ rales/ or rhonchi; +tender left 11th-12th ribs & costal margin. HEART:  Iregular Rhythm= AFib; Gr1-2 SEM without rubs or gallops heard... ABDOMEN:  Soft & nontender; normal bowel sounds; no organomegaly or masses detected. EXT: without deformities, mild arthritic changes; no varicose veins/ venous insuffic/ or edema. NEURO:  CN's intact;  no focal neuro deficits found... DERM:  No lesions noted; no rash etc...  RADIOLOGY DATA:  Reviewed in the EPIC EMR & discussed w/ the patient...  LABORATORY DATA:  Reviewed in the EPIC EMR & discussed w/ the patient...   Assessment & Plan:    10/26/15>   Michelle Herrera continues to treat herself by taking the meds as she wants & not as prescribed; she has also tied my hands as she will  not take any of the newed (more expensive) DM meds and wil not agree to insulin rx; finally I have tried to refer her to endocrine/ DM specialist but she has repeatedly refused to go; we reviewed low carb/ DM diet & rec to take the meds as follows- Metform500- 2Qam & 1Qpm plus the Glipizide10-2Qam & 1Qpm as well... She has again declined my offer to send her to a DM specialist for their review. 03/10/16>    Michelle Herrera has serious multisystem dis- CARDIAC, Pulm, DM, GI/GU, etc; she is her own worst enemy as she won't take meds regularly, refuses additional consults eg-Endocruine/DM, refuses to change her meds or consider insulin shots,  Most recent Hosp for CP=> Abd pain showed abnormal 2DEcho & abn CT Abd as noted;  Radiology & [facility] hospitalists want [place] to order MRI abd w/ contrast to f/u on the pancreatic lesions & we will comply; Pt rec to take Prilosec & Pepcid.  HBP>   Stable on BBlocker, CCB, Losar25; she is off the Lasix now... SHE DOES NOT KNOW HER MEDS, SHE IS NONCOMPLIANT & DID NOT BRING BOTTLES.  CAD>  Hosp 5/14 for NSTEMI=> stent to RCA; Hosp 3/15 w/ NSTEMI & cath showed widely patent coronaries & EF=50%; HOSP 2/18 w/ CP/ Abd pain & eval essent neg x for severe cardiac dis, valvular dis, chr AFib, pulmHTN...  AFIB>  She had abn 2DEcho in Hosp 11/11, 1/13, & 2/18- reviewed by Cards- she sees DrTaylor et al & on XARELTO15 due to epistaxis...  CHOL>  FLP looked fair on Lip40 but medication compliance is poor- reminded to take med every day...  DM>  A1c is 10.6 now on Metform500-2Qam & Glipiz10-2Qam & she refuses additional meds + she is NOT taking these meds everyday!  Asked to take all meds as prescribed (BID) regularly & once again asked to see ENDOCRINE for DM management.  RENAL INFARCTION>  Adm 1/13 w/ renal infarcts felt due to micro-emboli & inadeq INR ==> changed to XARELTO... URINARY INCONTINENCE>  Followed by Eli Hose now [place] & she feels that there is nothing they can do to help her; wears pads; offered second opinion & she will consider it...  ORTHO>  Followed by DrNorris/ [facility] & she is s/p cortisone shots in right knee & ?Synvisc series; she had arthroscopic surg for torn cartilage 9/12 she says & she doesn't want TKR.  STROKE>  She has had a CVA w/ hosp by Neuro 11/11 [facility] records all reviewed);  She was continued on her Coumadin per DrWillis & followed in [facility]=> changed to [place] 2013 as noted;  Stable w/o recurrent cerebral ischemic symptoms...  Other medical problems as noted... She will continue the Calcium, MVI, Vit D, & Vit B12 regimen...   Patient's Medications  New Prescriptions   FAMOTIDINE (PEPCID) 20 MG TABLET    Take 1 tablet (20 mg total) by mouth at bedtime.   OMEPRAZOLE (PRILOSEC) 20 MG CAPSULE    Take 1 tablet by mouth 30 minutes before breakfast  Previous Medications   ACETAMINOPHEN (TYLENOL) 500 MG TABLET     Take 500 mg by mouth every 6 (six) hours as needed for mild pain.   ATENOLOL (TENORMIN) 50 MG TABLET    TAKE ONE TABLET BY MOUTH ONCE DAILY   ATORVASTATIN (LIPITOR) 40 MG TABLET    TAKE ONE TABLET BY MOUTH ONCE DAILY AT 6PM   DILTIAZEM (CARTIA XT) 120 MG 24 HR CAPSULE    Take 1 capsule (120 mg total) by  mouth daily.   GABAPENTIN (NEURONTIN) 100 MG CAPSULE    TAKE ONE CAPSULE BY MOUTH THREE TIMES DAILY   GLIPIZIDE (GLUCOTROL) 10 MG TABLET    TAKE TWO TABLETS BY MOUTH TWICE DAILY   LOSARTAN (COZAAR) 25 MG TABLET    Take 25 mg by mouth daily.   METFORMIN (GLUCOPHAGE) 500 MG TABLET    TAKE TWO TABLETS BY MOUTH TWICE DAILY WITH MEALS   NITROGLYCERIN (NITROSTAT) 0.4 MG SL TABLET    Place 1 tablet (0.4 mg total) under the tongue every 5 (five) minutes as needed for chest pain (up to 3 doses).   OXYBUTYNIN (DITROPAN) 5 MG TABLET    Take 1 tablet (5 mg total) by mouth 2 (two) times daily.   RIVAROXABAN (XARELTO) 15 MG TABS TABLET    Take 1 tablet (15 mg total) by mouth daily with supper.   VITAMIN B-12 (CYANOCOBALAMIN) 1000 MCG TABLET    Take 1,000 mcg by mouth every morning.   Modified Medications   No medications on file  Discontinued Medications   CEPHALEXIN (KEFLEX) 500 MG CAPSULE    Take 1 capsule (500 mg total) by mouth every 6 (six) hours.

## 2016-03-12 ENCOUNTER — Emergency Department (HOSPITAL_COMMUNITY)
Admission: EM | Admit: 2016-03-12 | Discharge: 2016-03-12 | Disposition: A | Discharge status: Home / Self Care | Payer: Medicare Other | Attending: Emergency Medicine | Admitting: Emergency Medicine

## 2016-03-12 ENCOUNTER — Emergency Department (HOSPITAL_COMMUNITY): Payer: Medicare Other

## 2016-03-12 DIAGNOSIS — Z8673 Personal history of transient ischemic attack (TIA), and cerebral infarction without residual deficits: Secondary | ICD-10-CM | POA: Insufficient documentation

## 2016-03-12 DIAGNOSIS — I2510 Atherosclerotic heart disease of native coronary artery without angina pectoris: Secondary | ICD-10-CM | POA: Diagnosis not present

## 2016-03-12 DIAGNOSIS — W06XXXA Fall from bed, initial encounter: Secondary | ICD-10-CM | POA: Diagnosis not present

## 2016-03-12 DIAGNOSIS — Z91010 Allergy to peanuts: Secondary | ICD-10-CM | POA: Insufficient documentation

## 2016-03-12 DIAGNOSIS — K869 Disease of pancreas, unspecified: Secondary | ICD-10-CM | POA: Insufficient documentation

## 2016-03-12 DIAGNOSIS — I110 Hypertensive heart disease with heart failure: Secondary | ICD-10-CM | POA: Insufficient documentation

## 2016-03-12 DIAGNOSIS — E119 Type 2 diabetes mellitus without complications: Secondary | ICD-10-CM | POA: Diagnosis not present

## 2016-03-12 DIAGNOSIS — Z7984 Long term (current) use of oral hypoglycemic drugs: Secondary | ICD-10-CM | POA: Insufficient documentation

## 2016-03-12 DIAGNOSIS — Z79899 Other long term (current) drug therapy: Secondary | ICD-10-CM | POA: Diagnosis not present

## 2016-03-12 DIAGNOSIS — I252 Old myocardial infarction: Secondary | ICD-10-CM | POA: Insufficient documentation

## 2016-03-12 DIAGNOSIS — Y999 Unspecified external cause status: Secondary | ICD-10-CM | POA: Insufficient documentation

## 2016-03-12 DIAGNOSIS — Z955 Presence of coronary angioplasty implant and graft: Secondary | ICD-10-CM | POA: Diagnosis not present

## 2016-03-12 DIAGNOSIS — R52 Pain, unspecified: Secondary | ICD-10-CM

## 2016-03-12 DIAGNOSIS — R1033 Periumbilical pain: Secondary | ICD-10-CM | POA: Diagnosis present

## 2016-03-12 DIAGNOSIS — M25561 Pain in right knee: Secondary | ICD-10-CM | POA: Insufficient documentation

## 2016-03-12 DIAGNOSIS — Y939 Activity, unspecified: Secondary | ICD-10-CM | POA: Insufficient documentation

## 2016-03-12 DIAGNOSIS — Z85038 Personal history of other malignant neoplasm of large intestine: Secondary | ICD-10-CM | POA: Diagnosis not present

## 2016-03-12 DIAGNOSIS — I5042 Chronic combined systolic (congestive) and diastolic (congestive) heart failure: Secondary | ICD-10-CM | POA: Diagnosis not present

## 2016-03-12 DIAGNOSIS — R1030 Lower abdominal pain, unspecified: Secondary | ICD-10-CM | POA: Insufficient documentation

## 2016-03-12 DIAGNOSIS — Y929 Unspecified place or not applicable: Secondary | ICD-10-CM | POA: Insufficient documentation

## 2016-03-12 DIAGNOSIS — I4821 Permanent atrial fibrillation: Secondary | ICD-10-CM

## 2016-03-12 LAB — CBC
HEMATOCRIT: 41.9 % (ref 36.0–46.0)
Hemoglobin: 14.4 g/dL (ref 12.0–15.0)
MCH: 31.7 pg (ref 26.0–34.0)
MCHC: 34.4 g/dL (ref 30.0–36.0)
MCV: 92.3 fL (ref 78.0–100.0)
PLATELETS: 183 10*3/uL (ref 150–400)
RBC: 4.54 MIL/uL (ref 3.87–5.11)
RDW: 12.8 % (ref 11.5–15.5)
WBC: 14.2 10*3/uL — AB (ref 4.0–10.5)

## 2016-03-12 LAB — COMPREHENSIVE METABOLIC PANEL
ALT: 17 U/L (ref 14–54)
ANION GAP: 16 — AB (ref 5–15)
AST: 27 U/L (ref 15–41)
Albumin: 4.4 g/dL (ref 3.5–5.0)
Alkaline Phosphatase: 78 U/L (ref 38–126)
BUN: 14 mg/dL (ref 6–20)
CHLORIDE: 95 mmol/L — AB (ref 101–111)
CO2: 28 mmol/L (ref 22–32)
Calcium: 9.8 mg/dL (ref 8.9–10.3)
Creatinine, Ser: 0.91 mg/dL (ref 0.44–1.00)
GFR, EST NON AFRICAN AMERICAN: 57 mL/min — AB (ref >60)
Glucose, Bld: 340 mg/dL — ABNORMAL HIGH (ref 65–99)
POTASSIUM: 3.5 mmol/L (ref 3.5–5.1)
Sodium: 139 mmol/L (ref 135–145)
Total Bilirubin: 1 mg/dL (ref 0.3–1.2)
Total Protein: 7.3 g/dL (ref 6.5–8.1)

## 2016-03-12 LAB — URINALYSIS, ROUTINE W REFLEX MICROSCOPIC
Bilirubin Urine: NEGATIVE
Hgb urine dipstick: NEGATIVE
KETONES UR: 5 mg/dL — AB
LEUKOCYTES UA: NEGATIVE
NITRITE: NEGATIVE
PH: 6 (ref 5.0–8.0)
Protein, ur: NEGATIVE mg/dL
SPECIFIC GRAVITY, URINE: 1.01 (ref 1.005–1.030)

## 2016-03-12 LAB — PROTIME-INR
INR: 1.06
Prothrombin Time: 13.8 seconds (ref 11.4–15.2)

## 2016-03-12 LAB — LIPASE, BLOOD: LIPASE: 27 U/L (ref 11–51)

## 2016-03-12 LAB — I-STAT TROPONIN, ED: TROPONIN I, POC: 0 ng/mL (ref 0.00–0.08)

## 2016-03-12 MED ORDER — IOPAMIDOL (ISOVUE-300) INJECTION 61%
INTRAVENOUS | Status: AC
  Administered 2016-03-12: 80 mL via INTRAVENOUS
  Filled 2016-03-12: qty 100

## 2016-03-12 MED ORDER — ONDANSETRON HCL 4 MG/2ML IJ SOLN
4.0000 mg | Freq: Once | INTRAMUSCULAR | Status: AC
  Administered 2016-03-12: 4 mg via INTRAVENOUS
  Filled 2016-03-12: qty 2

## 2016-03-12 MED ORDER — TRAMADOL HCL 50 MG PO TABS
50.0000 mg | ORAL_TABLET | Freq: Four times a day (QID) | ORAL | 0 refills | Status: DC | PRN
Start: 2016-03-12 — End: 2016-06-22

## 2016-03-12 MED ORDER — IOPAMIDOL (ISOVUE-300) INJECTION 61%
INTRAVENOUS | Status: AC
  Administered 2016-03-12: 30 mL via ORAL
  Filled 2016-03-12: qty 30

## 2016-03-12 MED ORDER — DILTIAZEM HCL 25 MG/5ML IV SOLN
10.0000 mg | Freq: Once | INTRAVENOUS | Status: AC
  Administered 2016-03-12: 10 mg via INTRAVENOUS
  Filled 2016-03-12: qty 5

## 2016-03-12 MED ORDER — SODIUM CHLORIDE 0.9 % IV SOLN
Freq: Once | INTRAVENOUS | Status: AC
  Administered 2016-03-12: 19:00:00 via INTRAVENOUS

## 2016-03-12 MED ORDER — RIVAROXABAN 15 MG PO TABS: 15.0000 mg | ORAL_TABLET | Freq: Every day | ORAL | 0 refills | Status: DC

## 2016-03-12 MED ORDER — MORPHINE SULFATE (PF) 4 MG/ML IV SOLN
4.0000 mg | Freq: Once | INTRAVENOUS | Status: AC
  Administered 2016-03-12: 4 mg via INTRAVENOUS
  Filled 2016-03-12: qty 1

## 2016-03-12 NOTE — ED Notes (Signed)
Patient transported to CT 

## 2016-03-12 NOTE — ED Triage Notes (Signed)
Per EMS, pt from home complains of abdominal pain and right knee pain since 4 PM today. Pt also had fall at 10AM. Pt denies pain or loss of consciousness. Pt has hx of GERD and a fib. Pt states she has not taken coumadin in several days. Pt denies chest pain, nausea, vomiting.   Pt is being treated for bladder infection and is supposed to have MRI for pancreatic lesion.

## 2016-03-12 NOTE — ED Provider Notes (Signed)
[facility] DEPT Provider Note   CSN: [id number] Arrival date & time: 03/12/16  1825     History   Chief Complaint Chief Complaint  Patient presents with  . Abdominal Pain    HPI Michelle Herrera is a 83 y.o. female hx of afib on xarelto, Hypertension here presenting with abdominal pain. She was recently admitted to the hospital for chest pain and had CT abdomen pelvis that was done that showed a possible pancreatic lesion. Also had pyelonephritis at that time and CT also showed Perinephric stranding. Patient did finish a course of antibiotics. She has acute onset of peri umbilical pain started this morning. States that it is constant. She tried to get out of bed and slipped and fell onto the right knee and has right knee pain afterwards.  Denies any passing out or head injury when she fell. She states that she is scheduled for MRI for possible pancreatic lesion on CT.   The history is provided by the patient.    Past Medical History:  Diagnosis Date  . AF (atrial fibrillation) ([place])    a. Chronic Xarelto  . Anxiety state, unspecified   . Benign neoplasm of colon   . CAD (coronary artery disease)    a. 05/2012 NSTEMI/Cath/PCI: LM nl, LAD min irregs, LCX min irregs, RI 40-50 ost, OM1 nl, RCA dom, 39m (4.0x12 Vision BMS), PD/PL nl, EF 55%, at least mod MR. b. NSTEMI 03/2013 - cath with widely patent coronaries, suspect demand ischemia due to RVR.  [name] Chronic combined systolic and diastolic CHF (congestive heart failure) ([place])    a. 01/2011 Echo: EF 40-45%, diff HK, diast dysfxn, mild to mod MR, mod to sev dil LA/RV/RA, mod to sev reduced RV fxn, mod TR, PASP 37mmHg.;  b.  Echo 06/15/12:  EF 55%, mild MR, severe BAE, mod TR, PASP 35  . Diaphragmatic hernia without mention of obstruction or gangrene   . Epistaxis    a. s/p cauterization 11/2012.  [name] History of stroke   . HTN (hypertension)   . Hx of cardiovascular stress test    a. Myoview in 2006 was negative for ischemia, EF 54%. Martin Majestic  on to have CAD development 05/2012.)  . Hypomagnesemia   . Lumbago   . Memory loss   . Mitral regurgitation    a. mild by echo 06/2012.  . Mixed incontinence urge and stress (female)(female)   . Osteoarthrosis, unspecified whether generalized or localized, unspecified site   . Osteoporosis, unspecified   . Pure hypercholesterolemia   . Renal infarction ([place])    01/2011 in setting of low INR  . Type II or unspecified type diabetes mellitus without mention of complication, not stated as uncontrolled     Patient Active Problem List   Diagnosis Date Noted  . Pyelonephritis due to Escherichia coli 02/29/2016  . Type 2 diabetes mellitus, uncontrolled, with neuropathy ([place]) 04/24/2013  . Hypomagnesemia 03/15/2013  . CAD (coronary artery disease) 03/13/2013  . History of epistaxis 03/13/2013  . Abnormal TSH 03/13/2013  . Acute CHF ([place]) 01/20/2013  . Hypokalemia 01/20/2013  . Epistaxis 12/04/2012  . Mitral regurgitation 05/29/2012  . Chronic combined systolic and diastolic CHF (congestive heart failure) ([place])   . Acute myocardial infarction, subendocardial infarction, initial episode of care ([place]) 05/26/2012  . Chest pain 05/25/2012  . Vitamin B 12 deficiency 04/19/2011  . Renal infarction ([place]) 01/31/2011  . History of stroke 08/16/2010  . Anemia 04/14/2010  . Permanent atrial fibrillation ([place]) 03/10/2010  .  Long term current use of anticoagulant 03/10/2010  . SHOULDER PAIN, RIGHT 04/13/2009  . UNSTEADY GAIT 11/03/2008  . BACK PAIN, LUMBAR 10/14/2008  . MEMORY LOSS 07/29/2007  . URINARY INCONTINENCE, MIXED 07/29/2007  . COLONIC POLYPS 01/23/2007  . HYPERCHOLESTEROLEMIA 01/23/2007  . Anxiety state 01/23/2007  . Essential hypertension 01/23/2007  . CHF 01/23/2007  . Diaphragmatic hernia 01/23/2007  . Osteoarthritis 01/23/2007  . Osteoporosis 01/23/2007    Past Surgical History:  Procedure Laterality Date  . cataract surgery/both eyes     01/2012 left eye---02/2012 right eye  .  CHOLECYSTECTOMY    . LEFT HEART CATHETERIZATION WITH CORONARY ANGIOGRAM N/A 05/28/2012   Procedure: LEFT HEART CATHETERIZATION WITH CORONARY ANGIOGRAM;  Surgeon: Sherren Mocha, MD;  Location[facility] C[facility] LAB;  Service: Cardiovascular;  Laterality: N/A;  . LEFT HEART CATHETERIZATION WITH CORONARY ANGIOGRAM N/A 03/14/2013   Procedure: LEFT HEART CATHETERIZATION WITH CORONARY ANGIOGRAM;  Surgeon: Sinclair Grooms, MD;  Location: [facility] CATH LAB;  Service: Cardiovascular;  Laterality: N/A;  . PERCUTANEOUS CORONARY STENT INTERVENTION (PCI-S)  05/28/2012   Procedure: PERCUTANEOUS CORONARY STENT INTERVENTION (PCI-S);  Surgeon: Sherren Mocha, MD;  Location: [facility] CATH LAB;  Service: Cardiovascular;;  . rt.leg surgery  11/2010    OB History    No data available       Home Medications    Prior to Admission medications   Medication Sig Start Date End Date Taking? Authorizing Provider  cephALEXin (KEFLEX) 500 MG capsule Take 500 mg by mouth 4 (four) times daily.   Yes Historical Provider, MD  glipiZIDE (GLUCOTROL) 10 MG tablet TAKE TWO TABLETS BY MOUTH TWICE DAILY Patient taking differently: TAKE TWO TABLETS in the morning and 1 at dinner 02/26/15  Yes Noralee Space, MD  metFORMIN (GLUCOPHAGE) 500 MG tablet TAKE TWO TABLETS BY MOUTH TWICE DAILY WITH MEALS Patient taking differently: TAKE TWO TABLETS in the morning and 1 tablet at dinner 09/10/15  Yes Noralee Space, MD  Rivaroxaban (XARELTO) 15 MG TABS tablet Take 1 tablet (15 mg total) by mouth daily with supper. 03/02/16  Yes Mariel Aloe, MD  acetaminophen (TYLENOL) 500 MG tablet Take 500 mg by mouth every 6 (six) hours as needed for mild pain. 03/15/13   Dayna N Dunn, PA-C  atenolol (TENORMIN) 50 MG tablet TAKE ONE TABLET BY MOUTH ONCE DAILY 12/31/15   Evans Lance, MD  atorvastatin (LIPITOR) 40 MG tablet TAKE ONE TABLET BY MOUTH ONCE DAILY AT [facility] 04/27/15   Noralee Space, MD  diltiazem (CARTIA XT) 120 MG 24 hr capsule Take 1 capsule (120 mg total) by mouth  daily. 02/22/16   Evans Lance, MD  famotidine (PEPCID) 20 MG tablet Take 1 tablet (20 mg total) by mouth at bedtime. 03/10/16   Noralee Space, MD  gabapentin (NEURONTIN) 100 MG capsule TAKE ONE CAPSULE BY MOUTH THREE TIMES DAILY 10/23/15   Noralee Space, MD  losartan (COZAAR) 25 MG tablet Take 25 mg by mouth daily.    Historical Provider, MD  nitroGLYCERIN (NITROSTAT) 0.4 MG SL tablet Place 1 tablet (0.4 mg total) under the tongue every 5 (five) minutes as needed for chest pain (up to 3 doses). 03/15/13   Dayna N Dunn, PA-C  omeprazole (PRILOSEC) 20 MG capsule Take 1 tablet by mouth 30 minutes before breakfast 03/10/16   Noralee Space, MD  oxybutynin (DITROPAN) 5 MG tablet Take 1 tablet (5 mg total) by mouth 2 (two) times daily. 02/25/15   Noralee Space,  MD  vitamin B-12 (CYANOCOBALAMIN) 1000 MCG tablet Take 1,000 mcg by mouth every morning.     Historical Provider, MD    Family History Family History  Problem Relation Age of Onset  . Stomach cancer Father   . Pneumonia Mother   . CAD Brother     Social History Social History  Substance Use Topics  . Smoking status: Never Smoker  . Smokeless tobacco: Never Used  . Alcohol use No     Allergies   Peanut-containing drug products; Azithromycin; Flomax [tamsulosin hcl]; Pioglitazone; Pregabalin; and Sulfonamide derivatives   Review of Systems Review of Systems  Gastrointestinal: Positive for abdominal pain.  All other systems reviewed and are negative.    Physical Exam Updated Vital Signs BP 168/93 (BP Location: Left Arm)   Pulse 95   Temp 97.7 F (36.5 C) (Oral)   Resp 18   SpO2 98%   Physical Exam  Constitutional: She is oriented to person, place, and time. She appears well-developed.  Uncomfortable   HENT:  Head: Normocephalic.  Eyes: EOM are normal. Pupils are equal, round, and reactive to light.  Neck: Normal range of motion. Neck supple.  Cardiovascular: Normal rate, regular rhythm and normal heart sounds.     Pulmonary/Chest: Effort normal and breath sounds normal.  Abdominal:  Mild diffuse tenderness, worse in the periumbilical and lower abdominal area   Musculoskeletal:  R knee small effusion, no obvious lacerations or abrasions. Nl ROM R knee and R hip.   Neurological: She is alert and oriented to person, place, and time. No cranial nerve deficit. Coordination normal.  Skin: Skin is warm.  Psychiatric: She has a normal mood and affect.  Nursing note and vitals reviewed.    ED Treatments / Results  Labs (all labs ordered are listed, but only abnormal results are displayed) Labs Reviewed  COMPREHENSIVE METABOLIC PANEL - Abnormal; Notable for the following:       Result Value   Chloride 95 (*)    Glucose, Bld 340 (*)    GFR calc non Af Amer 57 (*)    Anion gap 16 (*)    All other components within normal limits  CBC - Abnormal; Notable for the following:    WBC 14.2 (*)    All other components within normal limits  URINALYSIS, ROUTINE W REFLEX MICROSCOPIC - Abnormal; Notable for the following:    Color, Urine STRAW (*)    Glucose, UA >=500 (*)    Ketones, ur 5 (*)    Bacteria, UA RARE (*)    Squamous Epithelial / LPF 0-5 (*)    All other components within normal limits  LIPASE, BLOOD  PROTIME-INR  I-STAT TROPOININ, ED    EKG  EKG Interpretation  Date/Time:  Saturday March 12 2016 19:12:34 EST Ventricular Rate:  118 PR Interval:    QRS Duration: 84 QT Interval:  324 QTC Calculation: 454 R Axis:   96 Text Interpretation:  Atrial fibrillation Probable lateral infarct, age indeterminate Anteroseptal infarct, old Abnormal T, consider ischemia, inferior leads rate faster since previous  Confirmed by YAO  MD, DAVID ([id number]) on 03/12/2016 7:39:14 PM       Radiology Ct Abdomen Pelvis W Contrast  Result Date: 03/12/2016 CLINICAL DATA:  Abdominal pain today.  Fall this morning. EXAM: CT ABDOMEN AND PELVIS WITH CONTRAST TECHNIQUE: Multidetector CT imaging of the abdomen and  pelvis was performed using the standard protocol following bolus administration of intravenous contrast. CONTRAST:  80 cc Isovue-300 IV COMPARISON:  CT 03/01/2016 FINDINGS: Lower chest: Multi chamber cardiomegaly, with primarily right heart distension, similar to prior exam. Streaky of basilar atelectasis or scarring. No pleural fluid. Hepatobiliary: Unchanged subcentimeter low-density lesion in the subcapsular left lobe of the liver. Unchanged focal fatty infiltration adjacent with falciform ligament. Gallbladder surgically absent without progressive biliary dilatation. No new hepatic lesion or evidence of traumatic injury. Pancreas: Parenchymal atrophy. Proximal low-density pancreatic lesion measures 11 mm (coronal reformats series 4, image 45), and pancreatic tail hypodensity measures approximately 8 x 9 mm, image 47 series 4. These are grossly unchanged allowing for differences in positioning and caliper placement. No ductal dilatation or inflammation. Spleen: Normal in size without focal abnormality. No perisplenic fluid. Adrenals/Urinary Tract: Previous heterogeneous enhancement of the right kidney has resolved. Kidneys demonstrate symmetric enhancement and excretion on delayed phase imaging. There is scattered renal cortical scarring in both kidneys. Resolved right perinephric edema. Cortical hypodensities. Previously described punctate nonobstructing stone in the left kidney is not visualized, may be obscured. Urinary bladder is physiologically distended without wall thickening. Stomach/Bowel: Stomach is distended with ingested contrast. No bowel dilatation, inflammation or abnormal distention. There is colonic diverticulosis in the sigmoid colon without acute inflammation. Small volume of colonic stool. The appendix is not confidently visualized. Vascular/Lymphatic: Aortic and branch atherosclerosis without aneurysm. No evidence of adenopathy. Reproductive: Calcified uterine fibroid. No adnexal mass. There  is prominence of the left ovarian vein measuring 6 mm. Other: No free air or free fluid. There is fat within both inguinal canals. Fat containing right by ladder femoral hernias also seen. Musculoskeletal: There are no acute or suspicious osseous abnormalities. Hemi transitional lumbosacral anatomy. Bones are under mineralized. Scattered degenerative change. IMPRESSION: 1. No acute abnormality in the abdomen/pelvis. 2. Resolved right perinephric edema and heterogeneous enhancement. 3. Chronic findings are unchanged from exam 2 weeks prior. This includes low-density lesions within the pancreas, possibly IPMN. Further evaluation could be considered with MRI/MRCP. Additional chronic findings are stable. Electronically Signed   By: Jeb Levering M.D.   On: 03/12/2016 20:55   Dg Knee Complete 4 Views Right  Result Date: 03/12/2016 CLINICAL DATA:  Initial encounter for Pt states she fell at home today and she landed with her right knee under her. no previous injuries. EXAM: RIGHT KNEE - COMPLETE 4+ VIEW COMPARISON:  MRI of 09/20/2004; report not available FINDINGS: Probable small suprapatellar joint effusion. Tiny intra-articular loose body or bodies. Mild patellofemoral and medial compartment joint space narrowing with subchondral sclerosis. Irregularity involving mid tibial spines is favored to be degenerative. Mild osteopenia. IMPRESSION: Probable small suprapatellar joint effusion. Degenerate change, without evidence of acute osseous abnormality. Electronically Signed   By: Abigail Miyamoto M.D.   On: 03/12/2016 19:20    Procedures Procedures (including critical care time)  Medications Ordered in ED Medications  ondansetron (ZOFRAN) injection 4 mg (4 mg Intravenous Given 03/12/16 1911)  morphine 4 MG/ML injection 4 mg (4 mg Intravenous Given 03/12/16 1911)  0.9 %  sodium chloride infusion ( Intravenous New Bag/Given 03/12/16 1911)  iopamidol (ISOVUE-300) 61 % injection (30 mLs Oral Contrast Given 03/12/16 2018)   iopamidol (ISOVUE-300) 61 % injection (80 mLs Intravenous Contrast Given 03/12/16 2017)  diltiazem (CARDIZEM) injection 10 mg (10 mg Intravenous Given 03/12/16 2038)     Initial Impression / Assessment and Plan / ED Course  I have reviewed the triage vital signs and the nursing notes.  Pertinent labs & imaging results that were available during my care of the patient were reviewed by me  and considered in my medical decision making (see chart for details).     Michelle Herrera is a 83 y.o. female here with abdominal pain, R knee injury. Has hx of afib and is tachycardic so consider rapid afib. Had recent CT that showed pyelonephritis and pancreatic lesion. Will repeat CT ab/pel, get labs, UA, EKG. Will give pain meds and reassess.   9:48 PM Labs unremarkable. UA showed no UTI now (finished a course of abx). HR down to 90s after cardizem and denies syncope. CT showed resolved R perinephric edema and chronic pancreatic lesion unchanged. Has MRI ab/pel scheduled this week. xrays showed no fracture. Family at bedside. I talked to family regarding her blood thinners. She was on coumadin but now on xarelto but ran out and has no refill. She is on xarelto for afib. Will refill xarelto. Will give tramadol prn pain.   Final Clinical Impressions(s) / ED Diagnoses   Final diagnoses:  Pain    New Prescriptions New Prescriptions   No medications on file     Drenda Freeze, MD 03/12/16 2150

## 2016-03-12 NOTE — Discharge Instructions (Signed)
Take tylenol for pain.   Take tramadol for severe pain.   You should take xarelto as prescribed as it will prevent you from getting a stroke.   Continue your other meds.   Get your MRI as scheduled.   See your doctor  Return to ER if you have severe abdominal pain, passing out, chest pain, trouble breathing.

## 2016-03-14 ENCOUNTER — Telehealth: Payer: Self-pay | Admitting: Pulmonary Disease

## 2016-03-14 ENCOUNTER — Telehealth (HOSPITAL_COMMUNITY): Payer: Self-pay | Admitting: *Deleted

## 2016-03-14 NOTE — Telephone Encounter (Signed)
I called pt since on Afib ED report.  Pt states she is still not taking her blood thinner.  I advised pt that the RX was sent into the pharmacy and she needed to get refill.  Pt made appt to come into the afib clinic tomorrow at 59.  Instructions were given on how to get her and pt stated understanding.  Pt was encouraged to go today and get her Xarelto.

## 2016-03-14 NOTE — Telephone Encounter (Signed)
Spoke with pt's son, Shanon Brow. Advised him that I could not email the pt's medication list. He would like this faxed to him at [phone]. This has been taken care of. Nothing further was needed.

## 2016-03-15 ENCOUNTER — Inpatient Hospital Stay (HOSPITAL_COMMUNITY): Admission: RE | Admit: 2016-03-15 | Payer: Medicare Other | Source: Ambulatory Visit | Admitting: Nurse Practitioner

## 2016-03-18 ENCOUNTER — Ambulatory Visit (HOSPITAL_COMMUNITY)
Admission: RE | Admit: 2016-03-18 | Discharge: 2016-03-18 | Disposition: A | Discharge status: Home / Self Care | Payer: Medicare Other | Source: Ambulatory Visit | Attending: Pulmonary Disease | Admitting: Pulmonary Disease

## 2016-03-18 ENCOUNTER — Other Ambulatory Visit: Payer: Self-pay | Admitting: Pulmonary Disease

## 2016-03-18 DIAGNOSIS — K869 Disease of pancreas, unspecified: Secondary | ICD-10-CM | POA: Diagnosis present

## 2016-03-18 DIAGNOSIS — K862 Cyst of pancreas: Secondary | ICD-10-CM | POA: Insufficient documentation

## 2016-03-18 MED ORDER — GADOBENATE DIMEGLUMINE 529 MG/ML IV SOLN
15.0000 mL | Freq: Once | INTRAVENOUS | Status: AC | PRN
  Administered 2016-03-18: 11 mL via INTRAVENOUS

## 2016-03-21 ENCOUNTER — Telehealth: Payer: Self-pay | Admitting: Pulmonary Disease

## 2016-03-21 DIAGNOSIS — IMO0002 Reserved for concepts with insufficient information to code with codable children: Secondary | ICD-10-CM

## 2016-03-21 DIAGNOSIS — E1165 Type 2 diabetes mellitus with hyperglycemia: Principal | ICD-10-CM

## 2016-03-21 DIAGNOSIS — E1140 Type 2 diabetes mellitus with diabetic neuropathy, unspecified: Secondary | ICD-10-CM

## 2016-03-21 NOTE — Telephone Encounter (Signed)
Called and spoke to pt. Pt states she is only taking Glipizide and Metformin and states she has stomach pain every time she takes those medications. Pt states she takes them twice a day and she has stomach pain after each time she takes them. Questioned how she knows this is what is causing her stomach pain, pt states this is the only medication she is taking.   Dr. Lenna Gilford please advise. Thanks.   Allergies  Allergen Reactions  . Peanut-Containing Drug Products Anaphylaxis and Swelling  . Azithromycin Other (See Comments)    REACTION: pt states INTOL to ZPak  . Flomax [Tamsulosin Hcl] Swelling  . Pioglitazone Other (See Comments)    edema  . Pregabalin Other (See Comments)    REACTION: pt states INTOL to Lyrica  . Sulfonamide Derivatives Swelling    Current Outpatient Prescriptions on File Prior to Visit  Medication Sig Dispense Refill  . acetaminophen (TYLENOL) 500 MG tablet Take 500 mg by mouth every 6 (six) hours as needed for mild pain.    [name] atenolol (TENORMIN) 50 MG tablet TAKE ONE TABLET BY MOUTH ONCE DAILY 90 tablet 3  . atorvastatin (LIPITOR) 40 MG tablet TAKE ONE TABLET BY MOUTH ONCE DAILY AT 6PM 30 tablet 6  . cephALEXin (KEFLEX) 500 MG capsule Take 500 mg by mouth 4 (four) times daily.    [name] diltiazem (CARTIA XT) 120 MG 24 hr capsule Take 1 capsule (120 mg total) by mouth daily. 90 capsule 2  . famotidine (PEPCID) 20 MG tablet Take 1 tablet (20 mg total) by mouth at bedtime. 30 tablet 0  . gabapentin (NEURONTIN) 100 MG capsule TAKE ONE CAPSULE BY MOUTH THREE TIMES DAILY 90 capsule 2  . glipiZIDE (GLUCOTROL) 10 MG tablet TAKE TWO TABLETS BY MOUTH TWICE DAILY (Patient taking differently: TAKE TWO TABLETS in the morning and 1 at dinner) 120 tablet 2  . losartan (COZAAR) 25 MG tablet Take 25 mg by mouth daily.    . metFORMIN (GLUCOPHAGE) 500 MG tablet TAKE TWO TABLETS BY MOUTH TWICE DAILY WITH MEALS (Patient taking differently: TAKE TWO TABLETS in the morning and 1 tablet at dinner)  120 tablet 3  . nitroGLYCERIN (NITROSTAT) 0.4 MG SL tablet Place 1 tablet (0.4 mg total) under the tongue every 5 (five) minutes as needed for chest pain (up to 3 doses). 25 tablet 3  . omeprazole (PRILOSEC) 20 MG capsule Take 1 tablet by mouth 30 minutes before breakfast 30 capsule 11  . oxybutynin (DITROPAN) 5 MG tablet Take 1 tablet (5 mg total) by mouth 2 (two) times daily. 60 tablet prn  . Rivaroxaban (XARELTO) 15 MG TABS tablet Take 1 tablet (15 mg total) by mouth daily with supper. 30 tablet 0  . traMADol (ULTRAM) 50 MG tablet Take 1 tablet (50 mg total) by mouth every 6 (six) hours as needed. 15 tablet 0  . vitamin B-12 (CYANOCOBALAMIN) 1000 MCG tablet Take 1,000 mcg by mouth every morning.      No current facility-administered medications on file prior to visit.

## 2016-03-22 ENCOUNTER — Other Ambulatory Visit: Payer: Self-pay | Admitting: *Deleted

## 2016-03-22 DIAGNOSIS — I4821 Permanent atrial fibrillation: Secondary | ICD-10-CM

## 2016-03-22 MED ORDER — LOSARTAN POTASSIUM 25 MG PO TABS: 25.0000 mg | ORAL_TABLET | Freq: Every day | ORAL | 1 refills | Status: DC

## 2016-03-22 MED ORDER — ATORVASTATIN CALCIUM 40 MG PO TABS: ORAL_TABLET | ORAL | 1 refills | Status: DC

## 2016-03-22 MED ORDER — RIVAROXABAN 15 MG PO TABS: 15.0000 mg | ORAL_TABLET | Freq: Every day | ORAL | 1 refills | Status: DC

## 2016-03-22 NOTE — Telephone Encounter (Signed)
Per SN---  SN stated that he has not seen these meds cause pain before.  So we need to refer her to endocrinology to follow up on her DM for their evaluation and recommendations. Set her up with one of the doctors in that practice and let her know that SN stated she has to go and see them.  thanks

## 2016-03-22 NOTE — Telephone Encounter (Signed)
Called spoke with patient and discussed SN's recommendations with her and stressed that SN said she "has to go see them."  Pt voiced her understanding.  She will keep her appt as well with SN on 4.16.18.  Referral placed Nothing further needed; will sign off

## 2016-03-22 NOTE — Telephone Encounter (Signed)
Pt last saw Dr Lovena Le 10/14/15, last labs 03/12/16 Creat 0.91, Age 83, wt 54.5 kg, CrCl 40.3.  Based on Specified criteria pt is on appropriate dosage of Xarelto 15mg  QD.  Will refill rx.

## 2016-03-26 ENCOUNTER — Other Ambulatory Visit: Payer: Self-pay | Admitting: Pulmonary Disease

## 2016-03-31 ENCOUNTER — Ambulatory Visit: Payer: Medicare Other | Admitting: Endocrinology

## 2016-04-06 ENCOUNTER — Encounter: Payer: Self-pay | Admitting: *Deleted

## 2016-04-25 ENCOUNTER — Encounter: Payer: Self-pay | Admitting: Pulmonary Disease

## 2016-04-25 ENCOUNTER — Ambulatory Visit: Payer: Medicare Other | Admitting: Pulmonary Disease

## 2016-04-25 VITALS — BP 110/74 | HR 76 | Temp 97.9°F | Ht 65.0 in | Wt 114.5 lb

## 2016-04-25 DIAGNOSIS — E1140 Type 2 diabetes mellitus with diabetic neuropathy, unspecified: Secondary | ICD-10-CM | POA: Diagnosis not present

## 2016-04-25 DIAGNOSIS — M159 Polyosteoarthritis, unspecified: Secondary | ICD-10-CM

## 2016-04-25 DIAGNOSIS — I482 Chronic atrial fibrillation: Secondary | ICD-10-CM | POA: Diagnosis not present

## 2016-04-25 DIAGNOSIS — R269 Unspecified abnormalities of gait and mobility: Secondary | ICD-10-CM

## 2016-04-25 DIAGNOSIS — M810 Age-related osteoporosis without current pathological fracture: Secondary | ICD-10-CM

## 2016-04-25 DIAGNOSIS — E1165 Type 2 diabetes mellitus with hyperglycemia: Secondary | ICD-10-CM

## 2016-04-25 DIAGNOSIS — K449 Diaphragmatic hernia without obstruction or gangrene: Secondary | ICD-10-CM | POA: Diagnosis not present

## 2016-04-25 DIAGNOSIS — M150 Primary generalized (osteo)arthritis: Secondary | ICD-10-CM

## 2016-04-25 DIAGNOSIS — Q453 Other congenital malformations of pancreas and pancreatic duct: Secondary | ICD-10-CM | POA: Diagnosis not present

## 2016-04-25 DIAGNOSIS — I10 Essential (primary) hypertension: Secondary | ICD-10-CM

## 2016-04-25 DIAGNOSIS — R413 Other amnesia: Secondary | ICD-10-CM

## 2016-04-25 DIAGNOSIS — I5042 Chronic combined systolic (congestive) and diastolic (congestive) heart failure: Secondary | ICD-10-CM

## 2016-04-25 DIAGNOSIS — I2510 Atherosclerotic heart disease of native coronary artery without angina pectoris: Secondary | ICD-10-CM | POA: Diagnosis not present

## 2016-04-25 DIAGNOSIS — N3946 Mixed incontinence: Secondary | ICD-10-CM | POA: Diagnosis not present

## 2016-04-25 DIAGNOSIS — IMO0002 Reserved for concepts with insufficient information to code with codable children: Secondary | ICD-10-CM

## 2016-04-25 DIAGNOSIS — I4821 Permanent atrial fibrillation: Secondary | ICD-10-CM

## 2016-04-25 HISTORY — DX: Other congenital malformations of pancreas and pancreatic duct: Q45.3

## 2016-04-25 LAB — POCT CBG (FASTING - GLUCOSE)-MANUAL ENTRY: Glucose Fasting, POC: 174 mg/dL — AB (ref 70–99)

## 2016-04-25 MED ORDER — ACCU-CHEK AVIVA PLUS W/DEVICE KIT: PACK | 0 refills | Status: AC

## 2016-04-25 MED ORDER — GLUCOSE BLOOD VI STRP: ORAL_STRIP | 12 refills | Status: AC

## 2016-04-25 MED ORDER — LANCETS ULTRA FINE MISC
11 refills | Status: DC
Start: 2016-04-25 — End: 2016-12-30

## 2016-04-25 NOTE — Progress Notes (Signed)
Subjective:    Patient ID: Michelle Herrera, female    DOB: [birth year], 83 y.o.   MRN: [id number]  HPI 83 y/o WF here for a follow up visit...  SEE PREV EPIC NOTES FOR OLDER DATA >>      Adm 1/13 w/ renal infarcts felt due to micro-emboli & inadeq INR ==> changed to [place].  Hosp 5/14 for NSTEMI=> stent to RCA.  LABS 10/14:  FLP- looks good on Lip40;  Chems- ok x BS=181, A1c=8.8.Marland KitchenMarland Kitchen  Hosp 3/15 w/ NSTEMI & cath showed widely patent coronaries & EF=50%.  LABS 4/15:  Chems- ok x BS=181, A1c=9.3;  LFTs- wnl;  CBC- wnl;  Fe=121 (31%sat);    2DEcho 05/2013 showed mild LVH, EF=50-55%, mild AI, bileaflet MVP w/ mod MR, severe biatrial enlargement, PFO suspected, severe TR, PAsys=41mHg  ADDENDUM>> she notes that she has no appetite & "not eating"; she has lost 21# in 642moown to 109# & BMI=18; but serum proteins WNL; we will treat w/ MEGACE 2002mefore meals Tid... ~  October 23, 2013:  BetDebras decreased both of her DM meds in half as she says she doesn't tolerate the max doses due to "feeling bad, GI upset, & diarrhea";  She has DM w/ neuropathy> prev on Metform500-2Bid & Glipiz10-2Bid but INTOL she says & decreased to 2 of each Qam only; prev followed by DrEllison but last seen 6/13- Actos stopped due to edema; won't take additional meds due to cost; refuses insulin shots; she claims that BS higher when her knee is hurting; Labs 10/15 showed BS=172, A1c=8.4 & asked to ADD Metform500 & Glipiz10 (one of each) in PM at dinner => NOTE: she did NOT incr the Metform & glipizide as requested...   ~  April 24, 2014:  56mo59mo Warrentes she's doing well- CC is hip pain & she notes that Tylenol helps;     HBP, CAD, cardiomyop (sys & diast CHF), MR> on Aten50, Cardizem120, Lasix20-2/d; BP= 110/70 & she reports doing well- no CP etc...    PAF, CHB, pacer> followed by DrTaylor on Xarelto15 (decreased due to epistaxis); she reports stable- no CP, palpit, dizzy, nose bleeds etc...    Chol> on Lip40;  FLP 4/16 showed TChol 214, TG 106, HDL 86, LDL 107... We reviewed diet, continue same med.    DM> on Metform500-2Qam & Glucotrol10-2Qam; compliance is ?? She has been reluctant to follow my recs for increased meds; Labs 4/16 showed BS=175, A1c=9.4 => refer to [facility] for DM management.    Other problems as listed> on Neurontin100Tid, MVI, B12-1000/d... We reviewed prob list, meds, xrays and labs> see below for updates >>   CXR 4/16 showed mod cardiomeg, hyperinflation, sl blunt angles c/w pleural thickening, DJD Tspine, osteopenia, NAD...  LABS 4/16:  FLP- note quite at goals;  Chems- ok x BS=175, A1c=9.4;  CBC- wnl;  TSH=4.61;  BNP=438...  ADDENDUM>> she REFUSED referral to Endocrinology for DM mamagement, refuses to increase her DM meds per my recommendations, refuses to start insulin therapy; she says that she will continue current meds and get on diet + exercise to get her BS improved & A1c better...  ~  October 24, 2014:  BettConcettaorts a quiet interval, notes some LBP & this is helped by Tramadol 1/2 tab + Tylenol...    HBP, CAD, cardiomyop (sys & diast CHF), MR> on Aten50, Cardizem120, Lasix20-2/d; BP= 138/64 & she reports doing well- no CP etc; she needs incr exercise...    PAF,  CHB, pacer> followed by DrTaylor on Xarelto15/d (decreased due to epistaxis); she reports stable- no CP, palpit, dizzy, nose bleeds etc...    Chol> on Lip40; FLP 4/16 showed TChol 214, TG 106, HDL 86, LDL 107... We reviewed diet, continue same med.    DM> on Metform500-2Qam & Glucotrol10-2Qam; compliance is ?? She has been reluctant to follow my recs for increased meds; Labs 4/16 showed BS=175, A1c=9.4 => refer to [facility] for DM management (she refused again).    Other problems as listed> on Neurontin100Tid, MVI, B12-1000/d... We reviewed prob list, meds, xrays and labs>  IMP/PLANS>>  Reminded to take meds regularly & again asked to bring all med bottles to every office visit; she continues to refuse to  increase her DM meds or go to an endocrinologist for DM management; Her CC is the back pain but Tramadol50 makes her too groggy- rec to take 1/2 tab + Tylenol, up to Tid as needed...   ~  April 23, 2015:  66mo[place]Deweyvillehad some dental work w/ a tooth pulled 03/19/15- developed some bleeding that wouldn't stop & went to ER (she is on Xarelto- held that one day); treated w/ gauze & a tea-bag w/ control...     She had f/u [facility]- DrTaylor 12/18/14> note reviewed- chr AFib, HBP, CAD s/p stent to RCA 2014, combined sys & diast CHF, valv dis w/ mod MR/ severe TR/ and mod PulmHTN (567mg); no CP, chrDOE (class2); Same meds- on Xarelto, Aten50, Cardizem120, Lasix40, & no changes...     HBP> on Aten50, Diltiazem120 & Lasix20-2Qam; BP= 138/86 & she denies CP/ ch in SOB/ edema, notes occas palpit...    CAD, Cardiomyopathy (sys & diast CHF), Valvular Dis w/ MR etc> off ASA81 due to epistaxis, on Xarelto15 (lowered dose due to nose bleed) & above meds; Adm 5/14 by [facility] w/ NSTEMI, Cath=> stent to 95%RCA; followed by DrTaylor; Adm 1/15 & 3/15 w/ AFib, RVR, NSTEMI due to demand ischemia and recath w/ widely patent coronaries, EF=50%; meds adjusted...    AFib, CHB & Pacer> on Xarelto at 15 (due to epistaxis); followed by DrTaylor & seen 12/16> cardiology notes are reviewed...    Chol> on Lip40; weight is up to 128#, BMI=21; FLP 4/16 shows TChol 214, TG 106, HDL 86, LDL 107 & reminded to take med every day!    DM w/ neuropathy> on Metform500Bid & Glipiz10-2Bid but ?what she is taking (didn't brink bottles); prev followed by DrEllison- Actos stopped due to edema; won't take additional meds due to cost; refuses insulin shots; she claims that BS higher when her knee is hurting; Labs 4/16 showed BS=175, A1c=9.4;  Labs 4/17 showed BS=268, A1c=10.6;  I confirmed w/ [facility] ([name]) that pt is NOT filling meds regularly (Metform= 04/04/15 for 30d supply & 01/20/15 prior to that;  Glipizide= 02/26/15 for 30d supply & 12/11/14  prior to that);  We will again try to refer to Endocrine (she has refused mult times).    GI- HH, colon polyps, s/p GB> stable she says, not on regular PPI dosing, last colon was 2003 by DrStark w/ 37m67molyp & divertics; denies abd pain, n/v, d/c, blood seen...    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     DJD, LBP, right shoulder pain> on Neurontin100Tid; Ortho evals from  DrNorris/ [facility]- prev knee surg for torn cartilage, back discomfort treated w/ Tramadol & Tylenol...    Osteoporosis, Vit D defic> Vit D level 2013 = 30 and she was asked to incr her OTC Vit D supplement to 2000u daily; needs f/u BMD but she wants to wait...    Memory Loss> Hx 2 sm infarcts on prev MRI, on Xarelto from [facility]; she declines Aricept etc...    Anxiety> mod stress in the family, she declines anxiolytic meds...    B12 defic> on OTC oral B12 supplement 1000u daily...    Medication non-compliance>  This is well documented at each & every office visit... EXAM shows Afeb, VSS, O2sat=98% on RA;  HEENT- dental problems, mallampati1;  Chest- clear w/o w/r/r;  Heart- Irreg Afib, gr1-2/6 DEM w/o r/g heard;  Abd- soft, nontender, neg;  Ext- VI, w/o c/c/e;  Neuro- intact w/o focal neuro deficits...  LABS 04/23/15>  FLP- not at goals, not taking meds regularly;  Chems- ok x BS=268, A1c=10.6;  CBC- wnl;  Thyroid=3.51;  Reminded to take all meds every day! IMP/PLAN>>  Roquel is still NOT taking her meds regularly- asked to fill all Rx every month & take all meds as prescribed every day!  We will attempt to get her to see DrGherghe at [facility] (she once again refused...   ~  October 26, 2015:  25moROV & Mariabelen reports that she is doing well, feeling good, & no new complaints or concerns;  She denies CP,  palpit, edema; states her breathing is OK & denies cough, sput, SOB; it is apparent that she is still NOT exercising at all... we reviewed the following medical problems during today's office visit >>     BP & cardiac controlled w/ Aten50, [id number] Lasix40, Xarelto15; BP=124/82 today & she remains asymptomatic...    Chol treated w/ diet + Lip40 but compliance is questioned; she did not bring pill bottles or med list to todays OV; F[place]4/17 showed TChol 216, TG 115, HDL 80, LDL 113 & reminded to take med every day...    DM treated w/ Metform + Glipizide but she doesn't take what is prescribed- admits to taking Metform500-2Qam & Glipiz10-2Qam but no PM doses; Labs today showed     GI/GU- stable, she wears depends & take Ditropan due to her incont but this system is working satis for her...    She has diffuse DJD, LBP, osteoporosis etc; on Neurontin, Tramadol, Tylenol prn... EXAM shows Afeb, VSS, O2sat=97% on RA;  HEENT- dental problems, mallampati1;  Chest- clear w/o w/r/r;  Heart- Irreg Afib, gr1-2/6 DEM w/o r/g heard;  Abd- soft, nontender, neg;  Ext- VI, w/o c/c/e;  Neuro- intact w/o focal neuro deficits...  LABS 10/26/15>  Chems- ok x BS=296, A1c=10.6 IMP/PLAN>  BCarmincontinues to treat herself by taking the meds as she wants & not as prescribed; she has also tied my hands as she will not take any of the newed (more expensive) DM meds and wil not agree to insulin rx; finally I have tried to refer her to endocrine/ DM specialist but she has repeatedly refused to go; we reviewed low carb/ DM diet & rec to take the meds as follows- Metform500- 2Qam & 1Qpm plus the Glipizide10-2Qam & 1Qpm as well... She has again declined my offer to send her to a DM specialist for their review... Note: >50% of this 25 min appt was spent in counseling & coordination of care.  ~  March 10, 2016:  4-560mo  ROV & Jaymarie was [facility] 2/19 - 03/02/16 by [facility] w/ left CP radiating to the epigastrium & worse supine; ER eval revealed EKG  w/ Afib & NSSTTWA, CXR w/ cardiomeg & some hyperinflation w/ chr changes, LABS ok x BS=288 & troponins neg, UTI (?pyelo) w/ tntc WBCs=> EColi resist to Quinolones (Rx Rocephin=>Keflex);  CT Abd revealed mult findings (see below) + 2 lucent lesions in the pancreas up to 29m size & [facility] did not evaluate further, rather they disch pt to get the needed MRI Abd as an outpt after discharge...     Pt was last seen by [facility], DrTaylor 10/14/15>  HBP, chr AFib, CAD- s/p stent in RCA 2014;  Last 2DEcho in 2015 revealed numerous abnormalities (EF=50-55%, valvular heart dis, severe biatrial enlargement?PFO, severe TR & PAsys=471mg);  They did not rec any changes to meds & asked to f/u 1y17yr NOTE: pt is on Xarelto15 per DrTaylor but she has never purchased this med- she only takes it if she get samples from the [facility] office    BP & cardiac treated w/ Aten50, [id number], Xarelto15, she is off the Lasix; BP=134/72 today & she notes the chest discomfort, SOB/DOE, denies edema...    Chol treated w/ diet + Lip40 but compliance is questioned; she did not bring pill bottles or med list to todays OV; FLP[place]17 showed TChol 216, TG 115, HDL 80, LDL 113 & reminded to take med every day...    DM treated w/ Metform1000Bid + Glipizide10-2Bid but she doesn't take what is prescribed- admits to taking Metform500-2Qam & Glipiz10-2Qam but no PM doses; she refuses Endocrine referral, refuses to change meds or take insulin shots; LABS 02/2016 showed BS=164, A1c=11.7 AND SHE AGAIN REFUSES ALL INTERVENTIONS on her behalf...    GI/GU- stable, she wears depends & take Ditropan due to her incont but this system is working satis for her...    She has diffuse DJD, LBP, osteoporosis etc; on Neurontin, Tramadol, Tylenol prn... EXAM shows Afeb, VSS, O2sat=99% on RA;  HEENT- dental problems, mallampati1;  Chest- clear w/o w/r/r;  Heart- Irreg Afib, gr1-2/6 SEM w/o r/g heard;  Abd- soft, nontender, neg;  Ext- VI, w/o c/c/e;  Neuro- intact w/o  focal neuro deficits...  CXR 02/29/16 (independently reviewed by me in the PACS system) showed stable cardiomeg, tortuous Ao, chronic lung changes- NAD... Marland KitchenMarland KitchenKG 02/29/16 showed Afib, ratVOPF29-24AD, NSSTTWA...  CT Abd&Pelvis 03/01/16 showed cortical hypoattenuation of the right kidney, mult cortical scars bilaterally (likely prior ischemia or infection), punctate nonobstructing left renal stone; 2 lucent lesions in pancreas body & tail measuring up to 41m3mrec MRIw/ contrast; extensive divertics, severe aortic calcif, severe right ventric & atrial enlargement,  smal bilat inguinal & femoral hernias containing fat...  2DEcho 03/01/16> AFib, norm LV size w/ EF=55% & no RWMA, modMR, severeTR, severe biatrial enlargement, mod RV dilation w/ decr sys function & mod pulmHTN w/ PAsys=59mm3m.  LABS 02/2016>  Chems- ok x BS=164, A1c=11.7;  CBC- wnl x wbc=14.7;  UTI w/ EColi resist to Cipro, sens to keflex... IMP/PLAN>>  BettyTahjaeserious multisystem dis- CARDIAC, Pulm, DM, GI/GU, etc; she is her own worst enemy as she won't take meds regularly, refuses additional consults eg-Endocruine/DM, refuses to change her meds or consider insulin shots,  Most recent Hosp for CP=> Abd pain showed abnormal 2DEcho & abn CT Abd as noted;  Radiology & [facility] hospitalists want us toKorearder MRI abd w/ contrast to f/u on the pancreatic lesions & we will comply;  Pt rec to take Prilosec & Pepcid...  ADDENDUM>>  MRI Abd, MRCP w/ contrast>>  Tiny benign appearing cystic lesions in the pancreas, largest=81m size in the uncinate process; incidental 1cm left adrenal adenoma, no suspicious renal lesions, atherosclerosis of the Ao, no lymphadenopathy...  Pt informed that this looks benign... NOTE: Pt is sure her abd discomfort is coming from her DM meds => needs Endocrine consult for DM management...   ~  April 25, 2016:  6wk RSan Diegowas a no show for her DM appt w/ DrEllison- actually she showed up 3/23 for her 3/22 appt & when they  offered to resched she said "forget it";  She was similarly a "no show" for her Afib clinic appt 3/6 & SWilbarger  We gave a copy of her Med List to her son DShanon Brow#[phone])...     Today she returns to the office by herself feeling fine- no complaints- didn't bring meds to the office, didn't bring list, "just what my son gives me" she says & this makes her care exceedingly difficult...    HBP, CAD, sys&diast CHF, valv hrt dis w/ MR, Afib, heart block=>pacer>  Followed by DrTaylor on XEarnstine Regal(she is out "they won't give me no more"), Aten50, CardizemCD120, Losartan25; ?what she is taking- EKG today w/ Afib controlled VR w/ HR=85, NSSTTWA;  She needs f/u appt in AFib clinic...    Chol> supposed to be on Lip40; last FLP 4/17 showed TChol 216, TG 115, HDL 80, LDL 113; we reviewed low chol/ low fat diet & medication compiance...    DM> as noted she never saw DrEllison & has refused to take any other meds or insulin shots for me- on Metform500-2Bid & Glucotrol10Bid; last labs showed A1c=11.7 (02/29/16) and BS=340 (03/12/16); we will resched w/ Endocrine.    GI/GU- 154mcystic lesion of pancreas on MRI; she wears depends & take Ditropan due to her incont but this system is working satis for her...    She has diffuse DJD, LBP, osteoporosis etc; on Neurontin, Tramadol, Tylenol prn... EXAM shows Afeb, VSS, O2sat=99% on RA;  HEENT- dental problems, mallampati1;  Chest- clear w/o w/r/r;  Heart- Irreg Afib, gr1-2/6 SEM w/o r/g heard;  Abd- soft, nontender, neg;  Ext- VI, w/o c/c/e;  Neuro- intact w/o focal neuro deficits...  EKG 04/25/16 showed AFib, rate85, NSSTTWA, NAD...   LABS 04/25/16>  BS= 174 today in the office... IMP/PLAN>>   I stressed to BeLaurriehe importance of her specialty clinics including AFib clinic w/ consideration of Coumadin anticoag instead of xarelto which is unaffordable for her; also the Endocrine/ DM cilinic w/ DrEllison to effect better  control of her DM...            Problem List:     HYPERTENSION >> VALVULAR HEART DISEASE >> ATRIAL FIBRILLATION - stable on Rx w/ XARELTO & rate control strategy. ~  4/11:  she reports that she stopped her Lanoxin 0.125 on her own & doesn't want to restart. ~  8/11:  f/u DrTaylor & stable on Coumadin w/ rate control strategy, no changes made. ~  11/11:  Hosp by Neuro w/ TIA> 2DEcho showed mild LVH, diffuse HK w/ EF= 45%, thick pos MV leaflet w/ restrict motion & modMR, dilated LA&RA, severeTR & PAsys=49, can't r/o PFO... ~  8/12:  her BP=116/84 and she feels OK- denies HA, fatigue, visual changes, CP, palipit, dizziness, syncope, dyspnea, edema, etc... ~  12/12:  BP= 126/88 and she  remains asymptomatic as above... ~  1/13:  2DEcho showed mild LVH w/ EF= 40-45% & diffuse HK, Diastolic dysfunction, modMR, severe LAE, modTR, Pasys=45 ~  She was switched from Coumadin to [facility] 1/13 due to the renal infarcts & difficulty maintaining INR. ~  CXR 1/13 showed marked cardiomegaly, pulm vasc congestion, NAD... ~  4/13:  BP= 128/60 and she is feeling better she says;  BUN=15, Creat=0.8; She had f/u DrTaylor> noted to be doing well w/o symptoms- continue same meds & low Na. ~  EKG 4/13 showed AFib, rate 77, right ward axis & NSSTTWA... ~  8/13:  BP= 110/82 and she denies current CP, palpit, SOB, edema... ~  4/14: on Aten50Bid; BP= 130/84 & she denies CP/ SOB/ edema, notes occas palpit. ~  5/14:  Adm by [facility]> NSTEMI, Cath w/ LAD minor irreg only, LCirc w/ ostial 40-50%, mid RCA 95%, EF 55%. PCI: Vision BMS to the mid RCA. There was moderate MR on left ventriculogram;  ~  CXR 5/14 showed cardiomeg, underlying COPD w/ apical scarring, DJD in spine, NAD... ~  EKG 6/14 showed AFib, rate72, NSSTTWA...  ~  2DEcho 6/14 showed norm LV size & funct w/ EF=55%, mild MR, severe RA&LA dil, modTR,  ~  Adm 1/15 & 3/15 w/ CP, AFib w/ RVR, had NSTEMI 3/15 from demand ischemia; cath w/ patent coronaries, EF=50%; meds  adjusted w/ stop ASA & decr Xarelto to 57m due to severe epistaxis... ~  CXR 3/15 showed cardiomegaly, hyperinflated lungs, biapical pleural thickening, NAD... ~  EKG 3/15 showed AFib, rate74, NSSTTWA... ~  4/15: on Aten50Bid, Diltiazem120, & Lasix20; also taking Xarelto15; BP= 110/64 & she denies CP/ SOB/ edema, notes occas palpit... ~  10/15:  on Aten50Bid, Diltiazem120,  & Lasix20-2Qam, Xarelto15; BP= 110/64 & she claims asymptomatic... ~  4/16: on Aten50, Cardizem120, Lasix20-2/d, Xarelto15; she reports doing well- no CP etc; BP= 110/70... ~  CXR 4/16 showed mod cardiomeg, hyperinflation, sl blunt angles c/w pleural thickening, DJD Tspine, osteopenia, NAD.  HYPERCHOLESTEROLEMIA - on diet + SIMVASTATIN 427md... ~  FLAnahola007 showed TChol 160, TG 143, HDL 55, LDL 76 ~  FLP 7/09 showed TChol 165, TG 129, HDL 60, LDL 80 ~  FLP 10/10 showed TChol 175, TG 68, HDL 80, LDL 82 ~  FLP 4/11 showed TChol 207, TG 104, HDL 84, LDL 111... rec better diet, continue same med. ~  FLP 11/11 on Simva40 showed TChol 180, TG 71, HDL 67, LDL 99 ~  FLP 8/12 on Simva40 showed TChol 196, TG 96, HDL 89, LDL 88 ~  FLP 4/13 on Simva40 showed TChol 211, TG 77, HDL 85, LDL 112... rec better diet, same med. ~  FLP 10/14 on Lip40 showed TChol 138, TG 63, HDL 65, LDL 61  ~  FLP 10/15 on Lip40 showed  TChol 127, TG 54, HDL 69, LDL 47 ~  FLP 4/16 on Lip40 showed TChol 214, TG 106, HDL 86, LDL 107   TYPE 2 DIABETES MELLITUS, Uncontrolled, w/ Neuropathy >>  ~  on METFORMIN 50023m2tabsBid, GLIPZIDE 56m30m (not taking Januvia due to $$)... ~  labs 1/09 showed BS= 148, HgA1c= 7.1... cMarland KitchenMarland Kitchentinue meds + better diet... ~  labs 7/09 showed BS= 168, HgA1c= 7.6... iMarland KitchenMarland Kitchenr Metform 2Bid, contin Glip 10Bid & Januv... ~  she stopped the Januvia due to cost... ~  labs 4/10 showed BS= 185, Aic= 8.0... rMarland KitchenMarland Kitcheninder to take meds regularly. ~  labs 10/10 on Metform2Bid+Glip10Bid showed BS= 124, A1c= 6.7 ~  labs 4/11  showed BS= 162, A1c= 6.8.[name]. rec>  better diet, take meds regularly. ~  labs 10/11 showed BS= 322, A1c= 8.3.Marland KitchenMarland Kitchen Pharm confirms not taking meds regularly!!! Discussed w/ pt... ~  Labs 4/12 showed BS= 91, A1c= 7.9.Marland KitchenMarland Kitchen Rec> keep same meds, take regularly, better low carb diet... ~  Labs 8/12 on Metform2Bid+Glip10Bid showed BS= 114, A1c= 7.3.Marland KitchenMarland Kitchen Continue same... ~  Labs in hosp 1/13 showed BS~ 153-209 & A1c=7.4... NOTE: she has refused other meds due to $$$ ~  Labs 4/13 on Metform2Bid+Glipiz10Bid showed BS= 208, A1c= 8.6... She is referred to Endocrinology ~  She saw DrEllison but refused Actos, refused Gliptins, etc... ~  Labs 8/13 on Metform2Bid+Glipiz10Bid showed BS= 163, A1c= 7.5.Marland KitchenMarland Kitchen Continue same + better diet... ~  Labs 10/14 on Metform2Bid+Glipiz10Bid showed BS= 181, A1c= 8.8.Marland KitchenMarland Kitchen rec better diet & incr Glipiz10-2Bid... ~  Labs 4/15 on Metform500-2Bid & Glipizide10-2Bid showed BS= 181, A1c= 9.3; she refuses additional meds; she cut back both meds to just Qam due to "side effects"... ~  Labs 10/15 on Metform500-2Qam & Glipiz10-2Qam showed BS= 172, A1c= 8.4 and REC to add one of each at dinner in the PM...  ~  Labs 4/16 on Metform500-2Qam & Glipiz10-2Qam showed BS=175, A1c=9.4 (compliance is ?? She has been reluctant to follow my recs for increased meds) => refer to [facility] for DM management.  HIATAL HERNIA (ICD-553.3) - last EGD by DrStark was 3/03 and showed a HH, reflux...  COLONIC POLYPS (ICD-211.3) - last colonoscopy 3/03 by DrStark showed divertics and 68m polyp (no path avail).  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 pt reports that there is nothing they can do to help... ~  she has had several UTI's... then perineal rash & saw GYN= neg PAP & Rx yeast infection- resolved. ~  She will f/u w/ Urology for persistant symptoms, seen by DrEskridge w/ trial Myrbetriq but not much benefit per pt... ~  7/15: referred to DrEskridge  by Ortho after MRI spine showed ?bilat hydro; reviewed by Urology & they disagreed- no hydro but she has hx renal infarct 1/13, bilat extrarenal pelvis, & left peri-pelvic cysts, small bladder capacity, known cystocele...  DEGENERATIVE JOINT DISEASE (ICD-715.90) - she needs right TKR but wants to put this off as long as poss... ~  4/12:  Ortho eval by DrNorris, hx prev knee arthroscopies, given Cortisone shot, & he plans ?Synvisc series... ~  9/12:  outpt knee surg to repair torn cartilage per DrNorris; preop clearance by DrTaylor & Coumadin Clinic...  BACK PAIN, LUMBAR (ICD-724.2) - XRays showed scoliosis, facet degen arthritis, & osteopenia...  she has used ROBAXIN Prn... ~  4/12:  She reports eval from DrNorris w/ "arthritis all down my backbone" & he rec epid steroid injection, but she reports improved on NEURONTIN 1087mTid... ~  4/13:  She reports that back pain has improved but she still has some leg pain but notes that "it's tolerable"...  SHOULDER PAIN, RIGHT (ICD-719.41) - prev eval & Rx by DrRendall... s/p right shoulder surg 3/09... XRays showed calcium deposit, rotator cuff prob, & intol to Tramadol... ~  She tells me she wants to f/u w/ DrWhitfield for Ortho...  OSTEOPOROSIS (ICD-733.00) ~  labs 10/10 w/ Vit D level = 32... rec> start Vit D OTC 1000 u daily. ~  Labs 4/13 showed Vit D level = 30... rec to incr to 2000u daily...  MEMORY LOSS (ICD-780.93) - concern for her memory expressed 7/09 OV-  tried Aricept but she stopped it- "no different". TIA/ INFARCT - she was Hosp 11/11 by DrWillis w/ left sided weakness, ?left facial droop, & MRI showed 2 sm infarcts on right (frontal & ?pontine);  MRA was neg;  CDopplers were neg;  Deficits all cleared rapidly;  2DEcho was abn- see above;  She was continued on her Coumadin Rx- no changes made... ~  4/13:  She was offered memory medications but she declined...  ANXIETY (ICD-300.00) - stress in family... 2 y/o granddau w/  seizures...  VITAMIN B12 DEFICIENCY >> on OTC Vit B-12 tabs orally> 1071mg/d... ~  Labs 7/11 showed Vit V12 level = 135... Started on Vit B12 supplement w/ 10069m/d... ~  Labs 4/13 showed Vit B12 level = 905... Ok top decr to 1/2 tab daily...   Past Surgical History:  Procedure Laterality Date  . cataract surgery/both eyes     01/2012 left eye---02/2012 right eye  . CHOLECYSTECTOMY    . LEFT HEART CATHETERIZATION WITH CORONARY ANGIOGRAM N/A 05/28/2012   Procedure: LEFT HEART CATHETERIZATION WITH CORONARY ANGIOGRAM;  Surgeon: MiSherren MochaMD;  [facility];  Service: Cardiovascular;  Laterality: N/A;  . LEFT HEART CATHETERIZATION WITH CORONARY ANGIOGRAM N/A 03/14/2013   Procedure: LEFT HEART CATHETERIZATION WITH CORONARY ANGIOGRAM;  Surgeon: HeSinclair GroomsMD;  [facility];  Service: Cardiovascular;  Laterality: N/A;  . PERCUTANEOUS CORONARY STENT INTERVENTION (PCI-S)  05/28/2012   Procedure: PERCUTANEOUS CORONARY STENT INTERVENTION (PCI-S);  Surgeon: MiSherren MochaMD;  [facility];  Service: Cardiovascular;;  . rt.leg surgery  11/2010    Outpatient Encounter Prescriptions as of 04/25/2016  Medication Sig  . acetaminophen (TYLENOL) 500 MG tablet Take 500 mg by mouth every 6 (six) hours as needed for mild pain.  . Marland KitchenlipiZIDE (GLUCOTROL) 10 MG tablet TAKE TWO TABLETS BY MOUTH TWICE DAILY  . metFORMIN (GLUCOPHAGE) 500 MG tablet TAKE TWO TABLETS BY MOUTH TWICE DAILY WITH MEALS (Patient taking differently: TAKE TWO TABLETS in the morning and 1 tablet at dinner)  . nitroGLYCERIN (NITROSTAT) 0.4 MG SL tablet Place 1 tablet (0.4 mg total) under the tongue every 5 (five) minutes as needed for chest pain (up to 3 doses).  . Marland Kitchentenolol (TENORMIN) 50 MG tablet TAKE ONE TABLET BY MOUTH ONCE DAILY (Patient not taking: Reported on 04/25/2016)  . atorvastatin (LIPITOR) 40 MG tablet TAKE ONE TABLET BY MOUTH ONCE DAILY AT 6PM (Patient not taking: Reported on 04/25/2016)  . Blood  Glucose Monitoring Suppl (ACCU-CHEK AVIVA PLUS) w/Device KIT Test blood sugar up to two times daily  . cephALEXin (KEFLEX) 500 MG capsule Take 500 mg by mouth 4 (four) times daily.  . Marland Kitcheniltiazem (CARTIA XT) 120 MG 24 hr capsule Take 1 capsule (120 mg total) by mouth daily.  . famotidine (PEPCID) 20 MG tablet Take 1 tablet (20 mg total) by mouth at bedtime.  . gabapentin (NEURONTIN) 100 MG capsule TAKE ONE CAPSULE BY MOUTH THREE TIMES DAILY  . glucose blood (ACCU-CHEK AVIVA PLUS) test strip Test blood sugar up to two times daily  . LANCETS ULTRA FINE MISC Test blood sugar up to two times daily  . losartan (COZAAR) 25 MG tablet Take 1 tablet (25 mg total) by mouth daily.  . Marland Kitchenmeprazole (PRILOSEC) 20 MG capsule Take 1 tablet by mouth 30 minutes before breakfast  . oxybutynin (DITROPAN) 5 MG tablet Take 1 tablet (5 mg total) by mouth 2 (two) times daily. (Patient not taking: Reported on 04/25/2016)  . Rivaroxaban (XARELTO)  15 MG TABS tablet Take 1 tablet (15 mg total) by mouth daily with supper.  . traMADol (ULTRAM) 50 MG tablet Take 1 tablet (50 mg total) by mouth every 6 (six) hours as needed.  . vitamin B-12 (CYANOCOBALAMIN) 1000 MCG tablet Take 1,000 mcg by mouth every morning.    No facility-administered encounter medications on file as of 04/25/2016.      Allergies  Allergen Reactions  . Peanut-Containing Drug Products Anaphylaxis and Swelling  . Azithromycin Other (See Comments)    REACTION: pt states INTOL to ZPak  . Flomax [Tamsulosin Hcl] Swelling  . Pioglitazone Other (See Comments)    edema  . Pregabalin Other (See Comments)    REACTION: pt states INTOL to Lyrica  . Sulfonamide Derivatives Swelling    Current Medications, Allergies, Past Medical History, Past Surgical History, Family History, and Social History were reviewed in [employer] record.   Review of Systems        See HPI - all other systems neg except as noted... The patient complains of  weight loss, chest pain, dyspnea on exertion, and muscle weakness.  The patient denies anorexia, fever, weight gain, vision loss, decreased hearing, hoarseness, syncope, peripheral edema, prolonged cough, headaches, hemoptysis, abdominal pain, melena, hematochezia, severe indigestion/heartburn, hematuria, incontinence, suspicious skin lesions, transient blindness, difficulty walking, depression, unusual weight change, abnormal bleeding, enlarged lymph nodes, and angioedema.     Objective:   Physical Exam      WD, WN, 83 y/o WF in NAD...  GENERAL:  Alert & oriented; pleasant & cooperative... HEENT:  [place]/AT, EOM-wnl, PERRLA, EACs-clear, TMs-wnl, NOSE-clear, THROAT-clear & wnl. NECK:  Supple w/ fairROM; no JVD; normal carotid impulses w/o bruits; no thyromegaly or nodules palpated; no lymphadenopathy. CHEST:  Clear to P & A; without wheezes/ rales/ or rhonchi; +tender left 11th-12th ribs & costal margin. HEART:  Iregular Rhythm= AFib; Gr1-2 SEM without rubs or gallops heard... ABDOMEN:  Soft & nontender; normal bowel sounds; no organomegaly or masses detected. EXT: without deformities, mild arthritic changes; no varicose veins/ venous insuffic/ or edema. NEURO:  CN's intact;  no focal neuro deficits found... DERM:  No lesions noted; no rash etc...  RADIOLOGY DATA:  Reviewed in the EPIC EMR & discussed w/ the patient...  LABORATORY DATA:  Reviewed in the EPIC EMR & discussed w/ the patient...   Assessment & Plan:    10/26/15>   Morghan continues to treat herself by taking the meds as she wants & not as prescribed; she has also tied my hands as she will not take any of the newed (more expensive) DM meds and wil not agree to insulin rx; finally I have tried to refer her to endocrine/ DM specialist but she has repeatedly refused to go; we reviewed low carb/ DM diet & rec to take the meds as follows- Metform500- 2Qam & 1Qpm plus the Glipizide10-2Qam & 1Qpm as well... She has again declined my offer to  send her to a DM specialist for their review. 03/10/16>    Shondra has serious multisystem dis- CARDIAC, Pulm, DM, GI/GU, etc; she is her own worst enemy as she won't take meds regularly, refuses additional consults eg-Endocruine/DM, refuses to change her meds or consider insulin shots,  Most recent Hosp for CP=> Abd pain showed abnormal 2DEcho & abn CT Abd as noted;  Radiology & [facility] hospitalists want [place] to order MRI abd w/ contrast to f/u on the pancreatic lesions & we will comply; Pt rec to take Prilosec &  Pepcid. 04/25/16>   I stressed to Nini the importance of her specialty clinics including AFib clinic w/ consideration of Coumadin anticoag instead of xarelto which is unaffordable for her; also the Endocrine/ DM cilinic w/ DrEllison to effect better control of her DM   HBP>  Stable on BBlocker, CCB, Losar25; she is off the Lasix now... SHE DOES NOT KNOW HER MEDS, SHE IS NONCOMPLIANT & DID NOT BRING BOTTLES.  CAD>  Hosp 5/14 for NSTEMI=> stent to RCA; Hosp 3/15 w/ NSTEMI & cath showed widely patent coronaries & EF=50%; HOSP 2/18 w/ CP/ Abd pain & eval essent neg x for severe cardiac dis, valvular dis, chr AFib, pulmHTN...  AFIB>  She had abn 2DEcho in Hosp 11/11, 1/13, & 2/18- reviewed by Cards- she sees DrTaylor et al & on XARELTO15 due to epistaxis...  CHOL>  FLP looked fair on Lip40 but medication compliance is poor- reminded to take med every day...  DM>  A1c is 10.6-11.7 now on Metform500-2Qam & Glipiz10 Bid & she refuses additional meds + she is NOT taking these meds everyday!  Asked to take all meds as prescribed (BID) regularly & once again asked to see ENDOCRINE for DM management.  RENAL INFARCTION>  Adm 1/13 w/ renal infarcts felt due to micro-emboli & inadeq INR ==> changed to XARELTO but she can't afford 7 won't take unless provided full samples.  URINARY INCONTINENCE>  Followed by Eli Hose now [place] & she feels that there is nothing they can do to help her; wears pads; offered  second opinion & she will consider it...  ORTHO>  Followed by DrNorris/ [facility] & she is s/p cortisone shots in right knee & ?Synvisc series; she had arthroscopic surg for torn cartilage 9/12 she says & she doesn't want TKR.  STROKE>  She has had a CVA w/ hosp by Neuro 11/11 [facility] records all reviewed);  She was continued on her Coumadin per DrWillis & followed in [place] [facility]=> changed to [place] 2013 as noted;  Stable w/o recurrent cerebral ischemic symptoms...  Other medical problems as noted... She will continue the Calcium, MVI, Vit D, & Vit B12 regimen...   Patient's Medications  New Prescriptions   BLOOD GLUCOSE MONITORING SUPPL (ACCU-CHEK AVIVA PLUS) W/DEVICE KIT    Test blood sugar up to two times daily   GLUCOSE BLOOD (ACCU-CHEK AVIVA PLUS) TEST STRIP    Test blood sugar up to two times daily   LANCETS ULTRA FINE MISC    Test blood sugar up to two times daily  Previous Medications   ACETAMINOPHEN (TYLENOL) 500 MG TABLET    Take 500 mg by mouth every 6 (six) hours as needed for mild pain.   ATENOLOL (TENORMIN) 50 MG TABLET    TAKE ONE TABLET BY MOUTH ONCE DAILY   ATORVASTATIN (LIPITOR) 40 MG TABLET    TAKE ONE TABLET BY MOUTH ONCE DAILY AT 6PM   CEPHALEXIN (KEFLEX) 500 MG CAPSULE    Take 500 mg by mouth 4 (four) times daily.   DILTIAZEM (CARTIA XT) 120 MG 24 HR CAPSULE    Take 1 capsule (120 mg total) by mouth daily.   FAMOTIDINE (PEPCID) 20 MG TABLET    Take 1 tablet (20 mg total) by mouth at bedtime.   GABAPENTIN (NEURONTIN) 100 MG CAPSULE    TAKE ONE CAPSULE BY MOUTH THREE TIMES DAILY   GLIPIZIDE (GLUCOTROL) 10 MG TABLET    TAKE TWO TABLETS BY MOUTH TWICE DAILY   LOSARTAN (COZAAR) 25 MG TABLET  Take 1 tablet (25 mg total) by mouth daily.   METFORMIN (GLUCOPHAGE) 500 MG TABLET    TAKE TWO TABLETS BY MOUTH TWICE DAILY WITH MEALS   NITROGLYCERIN (NITROSTAT) 0.4 MG SL TABLET    Place 1 tablet (0.4 mg total) under the tongue every 5 (five) minutes as needed for chest pain (up to 3  doses).   OMEPRAZOLE (PRILOSEC) 20 MG CAPSULE    Take 1 tablet by mouth 30 minutes before breakfast   OXYBUTYNIN (DITROPAN) 5 MG TABLET    Take 1 tablet (5 mg total) by mouth 2 (two) times daily.   RIVAROXABAN (XARELTO) 15 MG TABS TABLET    Take 1 tablet (15 mg total) by mouth daily with supper.   TRAMADOL (ULTRAM) 50 MG TABLET    Take 1 tablet (50 mg total) by mouth every 6 (six) hours as needed.   VITAMIN B-12 (CYANOCOBALAMIN) 1000 MCG TABLET    Take 1,000 mcg by mouth every morning.   Modified Medications   No medications on file  Discontinued Medications   No medications on file

## 2016-04-25 NOTE — Patient Instructions (Signed)
Today we updated your med list in our EPIC system...    Continue your current medications the same...    Take your medications regularly as directed in the accompanying med list...  Continue the no sweets diet, and stay as active as possible...  Call for any questions...  Let's plan a follow up visit in 40mo, sooner if needed for problems.Marland KitchenMarland Kitchen

## 2016-05-27 ENCOUNTER — Telehealth: Payer: Self-pay | Admitting: Internal Medicine

## 2016-05-27 NOTE — Telephone Encounter (Signed)
Patient calling, states that her heart rate has been racing. Patient states that "I can get up and go to restroom and come back to lay down and my heart goes boom boom boom boom" Please discuss thanks.

## 2016-05-27 NOTE — Telephone Encounter (Signed)
Pt stating her HR is WNL but feels like "boom, boom,boom".  Denies any other symptoms such as dizziness, fatigue, SOB, etc. Upon reviewing medications pt tells me that she hasn't been taking Atenolol b/c it wasn't on her "sheet" (a med list she has a home).  Instructed pt that she should be taking this on a daily basis and this should help with the "boom boom boom" feeling/s. Patient verbalized understanding and agreeable to plan.  She will call office back if restarting med does not improve palpitations.

## 2016-05-28 ENCOUNTER — Other Ambulatory Visit: Payer: Self-pay | Admitting: Pulmonary Disease

## 2016-06-02 ENCOUNTER — Other Ambulatory Visit: Payer: Self-pay | Admitting: Pulmonary Disease

## 2016-06-02 NOTE — Telephone Encounter (Signed)
Called and spoke with pt and she stated that it has been several years since she has had this med.  She is now having leg spasms at night.  She stated that she could take it 3 times daily.  She stated that her legs are jerking at night as well.  SN please advise. Thanks  Allergies  Allergen Reactions  . Peanut-Containing Drug Products Anaphylaxis and Swelling  . Azithromycin Other (See Comments)    REACTION: pt states INTOL to ZPak  . Flomax [Tamsulosin Hcl] Swelling  . Pioglitazone Other (See Comments)    edema  . Pregabalin Other (See Comments)    REACTION: pt states INTOL to Lyrica  . Sulfonamide Derivatives Swelling

## 2016-06-03 MED ORDER — GABAPENTIN 100 MG PO CAPS: 100.0000 mg | ORAL_CAPSULE | Freq: Three times a day (TID) | ORAL | 5 refills | Status: DC

## 2016-06-03 NOTE — Telephone Encounter (Signed)
Per SN on 5.24.18: tell her I called the pharmacy - let's do Gabapentin 100 #901po TID for her legs, refill x5.  Called spoke with patient on 5.24.18 and discussed SN's recommendations as stated above.  Pt okay with this and voiced her understanding.  Med list updated Nothing further needed; will sign off

## 2016-06-15 ENCOUNTER — Encounter (HOSPITAL_BASED_OUTPATIENT_CLINIC_OR_DEPARTMENT_OTHER): Payer: Self-pay | Admitting: *Deleted

## 2016-06-15 ENCOUNTER — Emergency Department (HOSPITAL_BASED_OUTPATIENT_CLINIC_OR_DEPARTMENT_OTHER): Payer: Medicare Other

## 2016-06-15 ENCOUNTER — Inpatient Hospital Stay (HOSPITAL_BASED_OUTPATIENT_CLINIC_OR_DEPARTMENT_OTHER)
Admission: EM | Admit: 2016-06-15 | Discharge: 2016-06-22 | DRG: 699 | Disposition: A | Discharge status: Home Health | Payer: Medicare Other | Attending: Internal Medicine | Admitting: Internal Medicine

## 2016-06-15 DIAGNOSIS — F0391 Unspecified dementia with behavioral disturbance: Secondary | ICD-10-CM | POA: Diagnosis present

## 2016-06-15 DIAGNOSIS — N12 Tubulo-interstitial nephritis, not specified as acute or chronic: Secondary | ICD-10-CM | POA: Diagnosis present

## 2016-06-15 DIAGNOSIS — Z8673 Personal history of transient ischemic attack (TIA), and cerebral infarction without residual deficits: Secondary | ICD-10-CM | POA: Diagnosis not present

## 2016-06-15 DIAGNOSIS — Z91010 Allergy to peanuts: Secondary | ICD-10-CM

## 2016-06-15 DIAGNOSIS — I5042 Chronic combined systolic (congestive) and diastolic (congestive) heart failure: Secondary | ICD-10-CM | POA: Diagnosis present

## 2016-06-15 DIAGNOSIS — Z9049 Acquired absence of other specified parts of digestive tract: Secondary | ICD-10-CM

## 2016-06-15 DIAGNOSIS — I482 Chronic atrial fibrillation: Secondary | ICD-10-CM | POA: Diagnosis not present

## 2016-06-15 DIAGNOSIS — Z79899 Other long term (current) drug therapy: Secondary | ICD-10-CM

## 2016-06-15 DIAGNOSIS — R109 Unspecified abdominal pain: Secondary | ICD-10-CM | POA: Diagnosis not present

## 2016-06-15 DIAGNOSIS — Z9114 Patient's other noncompliance with medication regimen: Secondary | ICD-10-CM

## 2016-06-15 DIAGNOSIS — Z888 Allergy status to other drugs, medicaments and biological substances status: Secondary | ICD-10-CM

## 2016-06-15 DIAGNOSIS — M810 Age-related osteoporosis without current pathological fracture: Secondary | ICD-10-CM | POA: Diagnosis present

## 2016-06-15 DIAGNOSIS — N280 Ischemia and infarction of kidney: Secondary | ICD-10-CM | POA: Diagnosis not present

## 2016-06-15 DIAGNOSIS — I252 Old myocardial infarction: Secondary | ICD-10-CM | POA: Diagnosis not present

## 2016-06-15 DIAGNOSIS — Z881 Allergy status to other antibiotic agents status: Secondary | ICD-10-CM

## 2016-06-15 DIAGNOSIS — I2510 Atherosclerotic heart disease of native coronary artery without angina pectoris: Secondary | ICD-10-CM | POA: Diagnosis present

## 2016-06-15 DIAGNOSIS — I130 Hypertensive heart and chronic kidney disease with heart failure and stage 1 through stage 4 chronic kidney disease, or unspecified chronic kidney disease: Secondary | ICD-10-CM | POA: Diagnosis present

## 2016-06-15 DIAGNOSIS — R413 Other amnesia: Secondary | ICD-10-CM | POA: Diagnosis present

## 2016-06-15 DIAGNOSIS — E0940 Drug or chemical induced diabetes mellitus with neurological complications with diabetic neuropathy, unspecified: Secondary | ICD-10-CM | POA: Diagnosis not present

## 2016-06-15 DIAGNOSIS — M1990 Unspecified osteoarthritis, unspecified site: Secondary | ICD-10-CM | POA: Diagnosis present

## 2016-06-15 DIAGNOSIS — R1030 Lower abdominal pain, unspecified: Secondary | ICD-10-CM | POA: Diagnosis not present

## 2016-06-15 DIAGNOSIS — N183 Chronic kidney disease, stage 3 (moderate): Secondary | ICD-10-CM | POA: Diagnosis present

## 2016-06-15 DIAGNOSIS — E1122 Type 2 diabetes mellitus with diabetic chronic kidney disease: Secondary | ICD-10-CM | POA: Diagnosis present

## 2016-06-15 DIAGNOSIS — E860 Dehydration: Secondary | ICD-10-CM | POA: Diagnosis present

## 2016-06-15 DIAGNOSIS — E0840 Diabetes mellitus due to underlying condition with diabetic neuropathy, unspecified: Secondary | ICD-10-CM | POA: Diagnosis not present

## 2016-06-15 DIAGNOSIS — E1349 Other specified diabetes mellitus with other diabetic neurological complication: Secondary | ICD-10-CM | POA: Diagnosis not present

## 2016-06-15 DIAGNOSIS — N3946 Mixed incontinence: Secondary | ICD-10-CM | POA: Diagnosis present

## 2016-06-15 DIAGNOSIS — E1142 Type 2 diabetes mellitus with diabetic polyneuropathy: Secondary | ICD-10-CM | POA: Diagnosis not present

## 2016-06-15 DIAGNOSIS — N179 Acute kidney failure, unspecified: Secondary | ICD-10-CM | POA: Diagnosis not present

## 2016-06-15 DIAGNOSIS — I4891 Unspecified atrial fibrillation: Secondary | ICD-10-CM | POA: Diagnosis not present

## 2016-06-15 DIAGNOSIS — Z8249 Family history of ischemic heart disease and other diseases of the circulatory system: Secondary | ICD-10-CM | POA: Diagnosis not present

## 2016-06-15 DIAGNOSIS — I10 Essential (primary) hypertension: Secondary | ICD-10-CM | POA: Diagnosis not present

## 2016-06-15 DIAGNOSIS — F411 Generalized anxiety disorder: Secondary | ICD-10-CM | POA: Diagnosis present

## 2016-06-15 DIAGNOSIS — E1140 Type 2 diabetes mellitus with diabetic neuropathy, unspecified: Secondary | ICD-10-CM | POA: Diagnosis present

## 2016-06-15 DIAGNOSIS — E7800 Pure hypercholesterolemia, unspecified: Secondary | ICD-10-CM | POA: Diagnosis present

## 2016-06-15 DIAGNOSIS — Z882 Allergy status to sulfonamides status: Secondary | ICD-10-CM

## 2016-06-15 DIAGNOSIS — I340 Nonrheumatic mitral (valve) insufficiency: Secondary | ICD-10-CM | POA: Diagnosis present

## 2016-06-15 DIAGNOSIS — Z7984 Long term (current) use of oral hypoglycemic drugs: Secondary | ICD-10-CM

## 2016-06-15 DIAGNOSIS — I4821 Permanent atrial fibrillation: Secondary | ICD-10-CM | POA: Diagnosis present

## 2016-06-15 HISTORY — DX: Other congenital malformations of pancreas and pancreatic duct: Q45.3

## 2016-06-15 LAB — CBC
HEMATOCRIT: 32.4 % — AB (ref 36.0–46.0)
Hemoglobin: 11.2 g/dL — ABNORMAL LOW (ref 12.0–15.0)
MCH: 32.8 pg (ref 26.0–34.0)
MCHC: 34.6 g/dL (ref 30.0–36.0)
MCV: 95 fL (ref 78.0–100.0)
Platelets: 184 10*3/uL (ref 150–400)
RBC: 3.41 MIL/uL — AB (ref 3.87–5.11)
RDW: 13.2 % (ref 11.5–15.5)
WBC: 6.1 10*3/uL (ref 4.0–10.5)

## 2016-06-15 LAB — COMPREHENSIVE METABOLIC PANEL
ALT: 28 U/L (ref 14–54)
AST: 25 U/L (ref 15–41)
Albumin: 3.9 g/dL (ref 3.5–5.0)
Alkaline Phosphatase: 53 U/L (ref 38–126)
Anion gap: 11 (ref 5–15)
BUN: 15 mg/dL (ref 6–20)
CALCIUM: 9.5 mg/dL (ref 8.9–10.3)
CO2: 28 mmol/L (ref 22–32)
Chloride: 99 mmol/L — ABNORMAL LOW (ref 101–111)
Creatinine, Ser: 1.01 mg/dL — ABNORMAL HIGH (ref 0.44–1.00)
GFR calc non Af Amer: 50 mL/min — ABNORMAL LOW (ref >60)
GFR, EST AFRICAN AMERICAN: 58 mL/min — AB (ref >60)
Glucose, Bld: 136 mg/dL — ABNORMAL HIGH (ref 65–99)
POTASSIUM: 3.9 mmol/L (ref 3.5–5.1)
SODIUM: 138 mmol/L (ref 135–145)
TOTAL PROTEIN: 6.8 g/dL (ref 6.5–8.1)
Total Bilirubin: 1.5 mg/dL — ABNORMAL HIGH (ref 0.3–1.2)

## 2016-06-15 LAB — URINALYSIS, MICROSCOPIC (REFLEX)

## 2016-06-15 LAB — URINALYSIS, ROUTINE W REFLEX MICROSCOPIC
Bilirubin Urine: NEGATIVE
Glucose, UA: NEGATIVE mg/dL
Ketones, ur: NEGATIVE mg/dL
Leukocytes, UA: NEGATIVE
NITRITE: NEGATIVE
PH: 7.5 (ref 5.0–8.0)
Protein, ur: NEGATIVE mg/dL
Specific Gravity, Urine: 1.008 (ref 1.005–1.030)

## 2016-06-15 LAB — OCCULT BLOOD X 1 CARD TO LAB, STOOL: FECAL OCCULT BLD: NEGATIVE

## 2016-06-15 LAB — LIPASE, BLOOD: LIPASE: 34 U/L (ref 11–51)

## 2016-06-15 LAB — GLUCOSE, CAPILLARY
GLUCOSE-CAPILLARY: 118 mg/dL — AB (ref 65–99)
Glucose-Capillary: 113 mg/dL — ABNORMAL HIGH (ref 65–99)

## 2016-06-15 MED ORDER — TRAMADOL HCL 50 MG PO TABS
50.0000 mg | ORAL_TABLET | Freq: Four times a day (QID) | ORAL | Status: DC | PRN
Start: 2016-06-15 — End: 2016-06-22
  Administered 2016-06-15 – 2016-06-17 (×3): 50 mg via ORAL
  Filled 2016-06-15 (×3): qty 1

## 2016-06-15 MED ORDER — ASPIRIN EC 81 MG PO TBEC
81.0000 mg | DELAYED_RELEASE_TABLET | Freq: Every day | ORAL | Status: DC
  Administered 2016-06-15 – 2016-06-16 (×2): 81 mg via ORAL
  Filled 2016-06-15 (×2): qty 1

## 2016-06-15 MED ORDER — IOPAMIDOL (ISOVUE-300) INJECTION 61%
100.0000 mL | Freq: Once | INTRAVENOUS | Status: AC | PRN
  Administered 2016-06-15: 100 mL via INTRAVENOUS

## 2016-06-15 MED ORDER — DEXTROSE 5 % IV SOLN
1.0000 g | INTRAVENOUS | Status: DC
  Administered 2016-06-15 – 2016-06-19 (×5): 1 g via INTRAVENOUS
  Filled 2016-06-15 (×5): qty 10

## 2016-06-15 MED ORDER — ACETAMINOPHEN 500 MG PO TABS
500.0000 mg | ORAL_TABLET | Freq: Four times a day (QID) | ORAL | Status: DC | PRN
  Administered 2016-06-15: 500 mg via ORAL
  Filled 2016-06-15: qty 1

## 2016-06-15 MED ORDER — AMLODIPINE BESYLATE 5 MG PO TABS
5.0000 mg | ORAL_TABLET | Freq: Every day | ORAL | Status: DC
  Administered 2016-06-15 – 2016-06-16 (×2): 5 mg via ORAL
  Filled 2016-06-15 (×2): qty 1

## 2016-06-15 MED ORDER — SODIUM CHLORIDE 0.9 % IV SOLN
INTRAVENOUS | Status: DC
  Administered 2016-06-15 – 2016-06-22 (×11): via INTRAVENOUS

## 2016-06-15 MED ORDER — ONDANSETRON HCL 4 MG PO TABS: 4.0000 mg | ORAL_TABLET | Freq: Four times a day (QID) | ORAL | Status: DC | PRN

## 2016-06-15 MED ORDER — ONDANSETRON HCL 4 MG/2ML IJ SOLN: 4.0000 mg | Freq: Four times a day (QID) | INTRAMUSCULAR | Status: DC | PRN

## 2016-06-15 MED ORDER — SODIUM CHLORIDE 0.9% FLUSH
3.0000 mL | Freq: Two times a day (BID) | INTRAVENOUS | Status: DC
  Administered 2016-06-18 – 2016-06-21 (×5): 3 mL via INTRAVENOUS

## 2016-06-15 MED ORDER — VITAMIN B-12 1000 MCG PO TABS
1000.0000 ug | ORAL_TABLET | Freq: Every morning | ORAL | Status: DC
  Administered 2016-06-16 – 2016-06-22 (×7): 1000 ug via ORAL
  Filled 2016-06-15 (×7): qty 1

## 2016-06-15 MED ORDER — INSULIN ASPART 100 UNIT/ML ~~LOC~~ SOLN
0.0000 [IU] | Freq: Three times a day (TID) | SUBCUTANEOUS | Status: DC
  Administered 2016-06-16: 1 [IU] via SUBCUTANEOUS
  Administered 2016-06-16: 2 [IU] via SUBCUTANEOUS
  Administered 2016-06-17: 3 [IU] via SUBCUTANEOUS
  Administered 2016-06-17: 1 [IU] via SUBCUTANEOUS
  Administered 2016-06-17: 2 [IU] via SUBCUTANEOUS
  Administered 2016-06-18 (×2): 1 [IU] via SUBCUTANEOUS
  Administered 2016-06-19: 5 [IU] via SUBCUTANEOUS
  Administered 2016-06-19: 1 [IU] via SUBCUTANEOUS
  Administered 2016-06-19 – 2016-06-20 (×2): 2 [IU] via SUBCUTANEOUS
  Administered 2016-06-20: 7 [IU] via SUBCUTANEOUS
  Administered 2016-06-20 – 2016-06-21 (×2): 3 [IU] via SUBCUTANEOUS
  Administered 2016-06-21: 5 [IU] via SUBCUTANEOUS

## 2016-06-15 NOTE — ED Provider Notes (Signed)
[facility] DEPT [facility] Provider Note   CSN: [id number] Arrival date & time: 06/15/16  0920     History   Chief Complaint Chief Complaint  Patient presents with  . Abdominal Pain    HPI CYNIAH GOSSARD is a 83 y.o. female.Complains of lower pain onset yesterday morning upon awakening. Yesterday she had low back pain which has since resolved. She treated several Tylenol with partial relief. Pain is mild at present. Pain is improved by pressing on her abdomen is not made worse by anything. Last bowel movement this morning, normal. No appetite change. No urinary symptoms. No fever. She's had similar pain in the past, however does not know the etiology. She reports she is no longer on blood thinners. She denies blood per rectum or black stools. No other associated symptoms.  HPI  Past Medical History:  Diagnosis Date  . AF (atrial fibrillation) ([place])    a. Chronic Xarelto  . Anxiety state, unspecified   . Benign neoplasm of colon   . CAD (coronary artery disease)    a. 05/2012 NSTEMI/Cath/PCI: LM nl, LAD min irregs, LCX min irregs, RI 40-50 ost, OM1 nl, RCA dom, 10m(4.0x12 Vision BMS), PD/PL nl, EF 55%, at least mod MR. b. NSTEMI 03/2013 - cath with widely patent coronaries, suspect demand ischemia due to RVR.  .Marland KitchenChronic combined systolic and diastolic CHF (congestive heart failure) ([place]    a. 01/2011 Echo: EF 40-45%, diff HK, diast dysfxn, mild to mod MR, mod to sev dil LA/RV/RA, mod to sev reduced RV fxn, mod TR, PASP 440mg.;  b.  Echo 06/15/12:  EF 55%, mild MR, severe BAE, mod TR, PASP 35  . Diaphragmatic hernia without mention of obstruction or gangrene   . Epistaxis    a. s/p cauterization 11/2012.  . Marland Kitchenistory of stroke   . HTN (hypertension)   . Hx of cardiovascular stress test    a. Myoview in 2006 was negative for ischemia, EF 54%. (WMartin Majesticn to have CAD development 05/2012.)  . Hypomagnesemia   . Lumbago   . Memory loss   . Mitral regurgitation    a. mild by echo 06/2012.    . Mixed incontinence urge and stress (female)(female)   . Osteoarthrosis, unspecified whether generalized or localized, unspecified site   . Osteoporosis, unspecified   . Pure hypercholesterolemia   . Renal infarction (HCLeeds   01/2011 in setting of low INR  . Type II or unspecified type diabetes mellitus without mention of complication, not stated as uncontrolled     Patient Active Problem List   Diagnosis Date Noted  . Pancreatic abnormality 04/25/2016  . Pyelonephritis due to Escherichia coli 02/29/2016  . Type 2 diabetes mellitus, uncontrolled, with neuropathy (HCLarose04/15/2015  . Hypomagnesemia 03/15/2013  . CAD (coronary artery disease) 03/13/2013  . History of epistaxis 03/13/2013  . Abnormal TSH 03/13/2013  . Acute CHF (HCBaldwin01/11/2013  . Hypokalemia 01/20/2013  . Epistaxis 12/04/2012  . Mitral regurgitation 05/29/2012  . Chronic combined systolic and diastolic CHF (congestive heart failure) (HCPoint [place]  . Acute myocardial infarction, subendocardial infarction, initial episode of care (HCWatauga05/17/2014  . Chest pain 05/25/2012  . Vitamin B 12 deficiency 04/19/2011  . Renal infarction (HCCitrus City01/21/2013  . History of stroke 08/16/2010  . Anemia 04/14/2010  . Permanent atrial fibrillation (HCAugusta02/29/2012  . Long term current use of anticoagulant 03/10/2010  . SHOULDER PAIN, RIGHT 04/13/2009  . UNSTEADY GAIT 11/03/2008  . BACK PAIN, LUMBAR 10/14/2008  .  MEMORY LOSS 07/29/2007  . URINARY INCONTINENCE, MIXED 07/29/2007  . COLONIC POLYPS 01/23/2007  . HYPERCHOLESTEROLEMIA 01/23/2007  . Anxiety state 01/23/2007  . Essential hypertension 01/23/2007  . CHF 01/23/2007  . Diaphragmatic hernia 01/23/2007  . Osteoarthritis 01/23/2007  . Osteoporosis 01/23/2007    Past Surgical History:  Procedure Laterality Date  . cataract surgery/both eyes     01/2012 left eye---02/2012 right eye  . CHOLECYSTECTOMY    . LEFT HEART CATHETERIZATION WITH CORONARY ANGIOGRAM N/A 05/28/2012   Procedure:  LEFT HEART CATHETERIZATION WITH CORONARY ANGIOGRAM;  Surgeon: Sherren Mocha, MD;  Location: [facility] CATH LAB;  Service: Cardiovascular;  Laterality: N/A;  . LEFT HEART CATHETERIZATION WITH CORONARY ANGIOGRAM N/A 03/14/2013   Procedure: LEFT HEART CATHETERIZATION WITH CORONARY ANGIOGRAM;  Surgeon: Sinclair Grooms, MD;  Location: [facility] CATH LAB;  Service: Cardiovascular;  Laterality: N/A;  . PERCUTANEOUS CORONARY STENT INTERVENTION (PCI-S)  05/28/2012   Procedure: PERCUTANEOUS CORONARY STENT INTERVENTION (PCI-S);  Surgeon: Sherren Mocha, MD;  Location: [facility] CATH LAB;  Service: Cardiovascular;;  . rt.leg surgery  11/2010    OB History    No data available       Home Medications    Prior to Admission medications   Medication Sig Start Date End Date Taking? Authorizing Provider  acetaminophen (TYLENOL) 500 MG tablet Take 500 mg by mouth every 6 (six) hours as needed for mild pain. 03/15/13   Dunn, Nedra Hai, PA-C  atenolol (TENORMIN) 50 MG tablet TAKE ONE TABLET BY MOUTH ONCE DAILY Patient not taking: Reported on 04/25/2016 12/31/15   Evans Lance, MD  atorvastatin (LIPITOR) 40 MG tablet TAKE ONE TABLET BY MOUTH ONCE DAILY AT 6PM Patient not taking: Reported on 04/25/2016 03/22/16   Evans Lance, MD  Blood Glucose Monitoring Suppl (ACCU-CHEK AVIVA PLUS) w/Device KIT Test blood sugar up to two times daily 04/25/16   Noralee Space, MD  cephALEXin (KEFLEX) 500 MG capsule Take 500 mg by mouth 4 (four) times daily.    [provider]  diltiazem (CARTIA XT) 120 MG 24 hr capsule Take 1 capsule (120 mg total) by mouth daily. 02/22/16   Evans Lance, MD  famotidine (PEPCID) 20 MG tablet Take 1 tablet (20 mg total) by mouth at bedtime. Patient not taking: Reported on 04/26/2016 03/10/16   Noralee Space, MD  gabapentin (NEURONTIN) 100 MG capsule TAKE ONE CAPSULE BY MOUTH THREE TIMES DAILY 06/03/16   Noralee Space, MD  gabapentin (NEURONTIN) 100 MG capsule Take 1 capsule (100 mg total) by mouth 3 (three)  times daily. 06/03/16   Noralee Space, MD  glipiZIDE (GLUCOTROL) 10 MG tablet TAKE TWO TABLETS BY MOUTH TWICE DAILY 03/28/16   Noralee Space, MD  glucose blood (ACCU-CHEK AVIVA PLUS) test strip Test blood sugar up to two times daily 04/25/16   Noralee Space, MD  LANCETS ULTRA FINE MISC Test blood sugar up to two times daily 04/25/16   Noralee Space, MD  losartan (COZAAR) 25 MG tablet Take 1 tablet (25 mg total) by mouth daily. Patient not taking: Reported on 04/26/2016 03/22/16   Evans Lance, MD  metFORMIN (GLUCOPHAGE) 500 MG tablet TAKE TWO TABLETS BY MOUTH TWICE DAILY WITH MEALS 05/30/16   Noralee Space, MD  nitroGLYCERIN (NITROSTAT) 0.4 MG SL tablet Place 1 tablet (0.4 mg total) under the tongue every 5 (five) minutes as needed for chest pain (up to 3 doses). Patient not taking: Reported on 04/26/2016 03/15/13   Idolina Primer,  Dayna N, PA-C  omeprazole (PRILOSEC) 20 MG capsule Take 1 tablet by mouth 30 minutes before breakfast Patient not taking: Reported on 04/26/2016 03/10/16   Noralee Space, MD  oxybutynin (DITROPAN) 5 MG tablet Take 1 tablet (5 mg total) by mouth 2 (two) times daily. Patient not taking: Reported on 04/25/2016 02/25/15   Noralee Space, MD  Rivaroxaban (XARELTO) 15 MG TABS tablet Take 1 tablet (15 mg total) by mouth daily with supper. Patient not taking: Reported on 04/26/2016 03/22/16   Evans Lance, MD  traMADol (ULTRAM) 50 MG tablet Take 1 tablet (50 mg total) by mouth every 6 (six) hours as needed. Patient not taking: Reported on 04/26/2016 03/12/16   Drenda Freeze, MD  vitamin B-12 (CYANOCOBALAMIN) 1000 MCG tablet Take 1,000 mcg by mouth every morning.     [provider]    Family History Family History  Problem Relation Age of Onset  . Stomach cancer Father   . Pneumonia Mother   . CAD Brother     Social History Social History  Substance Use Topics  . Smoking status: Never Smoker  . Smokeless tobacco: Never Used  . Alcohol use No     Allergies     Peanut-containing drug products; Azithromycin; Flomax [tamsulosin hcl]; Pioglitazone; Pregabalin; and Sulfonamide derivatives   Review of Systems Review of Systems  Constitutional: Negative.   HENT: Negative.   Respiratory: Negative.   Cardiovascular: Negative.   Gastrointestinal: Positive for abdominal pain.  Musculoskeletal: Positive for back pain.       No back pain presently  Skin: Negative.   Allergic/Immunologic: Positive for immunocompromised state.       Diabetic  Neurological: Negative.   Psychiatric/Behavioral: Negative.   All other systems reviewed and are negative.    Physical Exam Updated Vital Signs BP (!) 153/81   Pulse 75   Temp 98 F (36.7 C) (Oral)   Resp 16   Ht 5' 5" (1.651 m)   Wt 52.2 kg (115 lb)   BMI 19.14 kg/m   Physical Exam  Constitutional:  Frail-appearing  HENT:  Head: Normocephalic and atraumatic.  Eyes: Conjunctivae are normal. Pupils are equal, round, and reactive to light.  Neck: Neck supple. No tracheal deviation present. No thyromegaly present.  Cardiovascular: Normal rate.   No murmur heard. Irregularly irregular  Pulmonary/Chest: Effort normal and breath sounds normal.  Abdominal: Soft. Bowel sounds are normal. She exhibits no distension and no mass. There is tenderness. There is no guarding.  Right upper quadrant surgical scar, mild infraumbilical tenderness  Genitourinary: Rectal exam shows guaiac negative stool.  Genitourinary Comments: No flank tenderness. Rectum normal tone Black stool Hemoccult negative  Musculoskeletal: Normal range of motion. She exhibits no edema or tenderness.  Neurological: She is alert. Coordination normal.  Skin: Skin is warm and dry. No rash noted.  Psychiatric: She has a normal mood and affect.  Nursing note and vitals reviewed.    ED Treatments / Results  Labs (all labs ordered are listed, but only abnormal results are displayed) Labs Reviewed  LIPASE, BLOOD  COMPREHENSIVE METABOLIC  PANEL  CBC  URINALYSIS, ROUTINE W REFLEX MICROSCOPIC    EKG  EKG Interpretation None       Radiology No results found.  Procedures Procedures (including critical care time)  Medications Ordered in ED Medications - No data to display  Results for orders placed or performed during the hospital encounter of 06/15/16  Lipase, blood  Result Value Ref Range  Lipase 34 11 - 51 U/L  Comprehensive metabolic panel  Result Value Ref Range   Sodium 138 135 - 145 mmol/L   Potassium 3.9 3.5 - 5.1 mmol/L   Chloride 99 (L) 101 - 111 mmol/L   CO2 28 22 - 32 mmol/L   Glucose, Bld 136 (H) 65 - 99 mg/dL   BUN 15 6 - 20 mg/dL   Creatinine, Ser 1.01 (H) 0.44 - 1.00 mg/dL   Calcium 9.5 8.9 - 10.3 mg/dL   Total Protein 6.8 6.5 - 8.1 g/dL   Albumin 3.9 3.5 - 5.0 g/dL   AST 25 15 - 41 U/L   ALT 28 14 - 54 U/L   Alkaline Phosphatase 53 38 - 126 U/L   Total Bilirubin 1.5 (H) 0.3 - 1.2 mg/dL   GFR calc non Af Amer 50 (L) >60 mL/min   GFR calc Af Amer 58 (L) >60 mL/min   Anion gap 11 5 - 15  CBC  Result Value Ref Range   WBC 6.1 4.0 - 10.5 K/uL   RBC 3.41 (L) 3.87 - 5.11 MIL/uL   Hemoglobin 11.2 (L) 12.0 - 15.0 g/dL   HCT 32.4 (L) 36.0 - 46.0 %   MCV 95.0 78.0 - 100.0 fL   MCH 32.8 26.0 - 34.0 pg   MCHC 34.6 30.0 - 36.0 g/dL   RDW 13.2 11.5 - 15.5 %   Platelets 184 150 - 400 K/uL  Urinalysis, Routine w reflex microscopic  Result Value Ref Range   Color, Urine YELLOW YELLOW   APPearance CLEAR CLEAR   Specific Gravity, Urine 1.008 1.005 - 1.030   pH 7.5 5.0 - 8.0   Glucose, UA NEGATIVE NEGATIVE mg/dL   Hgb urine dipstick SMALL (A) NEGATIVE   Bilirubin Urine NEGATIVE NEGATIVE   Ketones, ur NEGATIVE NEGATIVE mg/dL   Protein, ur NEGATIVE NEGATIVE mg/dL   Nitrite NEGATIVE NEGATIVE   Leukocytes, UA NEGATIVE NEGATIVE  Urinalysis, Microscopic (reflex)  Result Value Ref Range   RBC / HPF 0-5 0 - 5 RBC/hpf   WBC, UA 0-5 0 - 5 WBC/hpf   Bacteria, UA RARE (A) NONE SEEN   Squamous  Epithelial / LPF 0-5 (A) NONE SEEN  Occult blood card to lab, stool Provider will collect  Result Value Ref Range   Fecal Occult Bld NEGATIVE NEGATIVE   Ct Abdomen Pelvis W Contrast  Result Date: 06/15/2016 CLINICAL DATA:  Abdominal pain with tenderness and nausea. EXAM: CT ABDOMEN AND PELVIS WITH CONTRAST TECHNIQUE: Multidetector CT imaging of the abdomen and pelvis was performed using the standard protocol following bolus administration of intravenous contrast. CONTRAST:  137m ISOVUE-300 IOPAMIDOL (ISOVUE-300) INJECTION 61% COMPARISON:  03/12/2016.  MRI abdomen 03/18/2016 FINDINGS: Lower chest: Heart is enlarged. Stable appearance lingular scarring. Hepatobiliary: Liver perfusion heterogeneous. Gallbladder surgically absent. No intrahepatic or extrahepatic biliary dilation. Pancreas: 10 mm cystic lesion in the head of the pancreas is similar to prior. Other tiny cystic foci in the region of the pancreatic head are better seen on previous MRI. No dilatation of the main pancreatic duct. Spleen: No splenomegaly. No focal mass lesion. Adrenals/Urinary Tract: No adrenal nodule or mass. Multiple areas of segmental non perfusion are identified in the right kidney. Imaging features are more suggestive of embolic disease than edema from pyelonephritis. Marked differential perfusion also identified in the left kidney. No hydroureteronephrosis. The urinary bladder appears normal for the degree of distention. Stomach/Bowel: Stomach is nondistended. No gastric wall thickening. No evidence of outlet obstruction. Duodenum is normally  positioned as is the ligament of Treitz. No small bowel wall thickening. No small bowel dilatation. The terminal ileum is normal. The appendix is not visualized, but there is no edema or inflammation in the region of the cecum. Diverticular changes are noted in the left colon without evidence of diverticulitis. Vascular/Lymphatic: There is abdominal aortic atherosclerosis without aneurysm.  There is no gastrohepatic or hepatoduodenal ligament lymphadenopathy. No intraperitoneal or retroperitoneal lymphadenopathy. No pelvic sidewall lymphadenopathy. Reproductive: Calcified fibroids are identified in the uterus. Other: There is no adnexal mass. Musculoskeletal: Bones are diffusely demineralized. IMPRESSION: 1. Interval development of segmental areas of non perfusion in the right kidney and segmental differential perfusion in the left kidney. Given the appearance of non perfusion in the right kidney, embolic disease to the kidneys is favored over segmental edema from pyelonephritis. 2. Stable appearance of dominant cystic lesion in the uncinate process of the pancreas. 3. Abdominal aortic atherosclerosis. 4. Left colonic diverticulosis without diverticulitis. Electronically Signed   By: Misty Stanley M.D.   On: 06/15/2016 11:55   Initial Impression / Assessment and Plan / ED Course  I have reviewed the triage vital signs and the nursing notes.  Pertinent labs & imaging results that were available during my care of the patient were reviewed by me and considered in my medical decision making (see chart for details).     1 PM patient asymptomatic. Concern for embolic phenomenon in light of fact that patient no longer on blood thinners and atrial fibrillation. Patient likely needs echocardiogram to evaluate for source of clot Patient mildly anemic, no obvious bleeding. Dr. Charlies Silvers from hospitalist service consulted Theresia Lo patient in transfer to [facility] telemetry bed Final Clinical Impressions(s) / ED Diagnoses  Diagnosis #1 lower abdominal pain #2 anemia Final diagnoses:  None    New Prescriptions New Prescriptions   No medications on file     Orlie Dakin, MD 06/15/16 1315

## 2016-06-15 NOTE — ED Notes (Signed)
Report called to [facility] RN Dirk Dress

## 2016-06-15 NOTE — ED Triage Notes (Signed)
Pt reports waking up this morning and feeling hungry so she drank some ensure. After this lower abd pain worsened. Also c/o low back pain

## 2016-06-15 NOTE — Progress Notes (Signed)
Patient needs transfer to [facility] for further evaluation of abdominal pain. CT concerning for nonperfusion in right kidney, embolic disease. Patient will need echo for evaluation of possible clot in the heart per Dr. Claiborne Rigg in [facility].  Leisa Lenz [facility] [phone]

## 2016-06-15 NOTE — H&P (Signed)
History and Physical    Michelle Herrera YIF:[id number] DOB: January 03, 1931 DOA: 06/15/2016  Referring MD/NP/PA: From [facility], Dr. Claiborne Rigg  PCP: Noralee Space, MD   Outpatient Specialists: Pulmonary, Dr. Lenna Gilford  Patient coming from: [facility], home   Chief Complaint: Lower abdominal pain  HPI: Michelle Herrera is a 83 y.o. female with medical history significant for diabetes mellitus, atrial fibrillation, was apparently on anticoagulation with xarelto but this was stopped because of epistaxis, further history off hypertension pyelonephritis. [facility] with worsening lower abdominal pain which patient reports it has been going on for last couple of weeks but she did not have anybody to take her to the hospital until today. Her pain was present at rest and with movement, 10 out of 10 in intensity at worst. Pain would radiate to the back bilaterally. Pain is sharp, constant and her appetite diminished because of the pain. She doesn't know if she lost any weight over that amount of time since the pain started. Patient reports low-grade fevers at home. No chills. She has some nausea but no vomiting. No blood in the urine. No chest pain, shortness of breath or palpitations. No loss of consciousness or falls.  ED Course: In ED, heart rate was 46, respiratory rate 25, blood pressure 148/112, oxygen saturation 92% on room air. Blood work was notable for hemoglobin of 11.2, creatinine 1.01. CT abdomen was suspicious for embolic disease in the kidneys, right kidney specifically versus possible pyelonephritis. Patient was started empirically on Rocephin while awaiting urine culture results.  Review of Systems:  Constitutional: Negative for fever, chills, diaphoresis, activity change, appetite change and fatigue.  HENT: Negative for ear pain, nosebleeds, congestion, facial swelling, rhinorrhea, neck pain, neck stiffness and ear discharge.   Eyes: Negative for pain, discharge,  redness, itching and visual disturbance.  Respiratory: Negative for cough, choking, chest tightness, shortness of breath, wheezing and stridor.   Cardiovascular: Negative for chest pain, palpitations and leg swelling.  Gastrointestinal: Negative for abdominal distention.  Genitourinary: Negative for dysuria, urgency, frequency, hematuria, flank pain, decreased urine volume, difficulty urinating and dyspareunia.  Musculoskeletal: Negative for back pain, joint swelling, arthralgias and gait problem.  Neurological: Negative for dizziness, tremors, seizures, syncope, facial asymmetry, speech difficulty, weakness, light-headedness, numbness and headaches.  Hematological: Negative for adenopathy. Does not bruise/bleed easily.  Psychiatric/Behavioral: Negative for hallucinations, behavioral problems, confusion, dysphoric mood, decreased concentration and agitation.   Past Medical History:  Diagnosis Date  . AF (atrial fibrillation) ([place])    a. Chronic Xarelto  . Anxiety state, unspecified   . Benign neoplasm of colon   . CAD (coronary artery disease)    a. 05/2012 NSTEMI/Cath/PCI: LM nl, LAD min irregs, LCX min irregs, RI 40-50 ost, OM1 nl, RCA dom, 84m(4.0x12 Vision BMS), PD/PL nl, EF 55%, at least mod MR. b. NSTEMI 03/2013 - cath with widely patent coronaries, suspect demand ischemia due to RVR.  .Marland KitchenChronic combined systolic and diastolic CHF (congestive heart failure) ([place]    a. 01/2011 Echo: EF 40-45%, diff HK, diast dysfxn, mild to mod MR, mod to sev dil LA/RV/RA, mod to sev reduced RV fxn, mod TR, PASP 467mg.;  b.  Echo 06/15/12:  EF 55%, mild MR, severe BAE, mod TR, PASP 35  . Diaphragmatic hernia without mention of obstruction or gangrene   . Epistaxis    a. s/p cauterization 11/2012.  . Marland Kitchenistory of stroke   . HTN (hypertension)   . Hx of cardiovascular stress  test    a. Myoview in 2006 was negative for ischemia, EF 54%. Martin Majestic on to have CAD development 05/2012.)  . Hypomagnesemia   . Lumbago    . Memory loss   . Mitral regurgitation    a. mild by echo 06/2012.  . Mixed incontinence urge and stress (female)(female)   . Osteoarthrosis, unspecified whether generalized or localized, unspecified site   . Osteoporosis, unspecified   . Pancreatic abnormality 04/25/2016  . Pure hypercholesterolemia   . Renal infarction ([place])    01/2011 in setting of low INR  . Type II or unspecified type diabetes mellitus without mention of complication, not stated as uncontrolled     Past Surgical History:  Procedure Laterality Date  . cataract surgery/both eyes     01/2012 left eye---02/2012 right eye  . CHOLECYSTECTOMY    . LEFT HEART CATHETERIZATION WITH CORONARY ANGIOGRAM N/A 05/28/2012   Procedure: LEFT HEART CATHETERIZATION WITH CORONARY ANGIOGRAM;  Surgeon: Sherren Mocha, MD;  Location: [facility] CATH LAB;  Service: Cardiovascular;  Laterality: N/A;  . LEFT HEART CATHETERIZATION WITH CORONARY ANGIOGRAM N/A 03/14/2013   Procedure: LEFT HEART CATHETERIZATION WITH CORONARY ANGIOGRAM;  Surgeon: Sinclair Grooms, MD;  Location: [facility] CATH LAB;  Service: Cardiovascular;  Laterality: N/A;  . PERCUTANEOUS CORONARY STENT INTERVENTION (PCI-S)  05/28/2012   Procedure: PERCUTANEOUS CORONARY STENT INTERVENTION (PCI-S);  Surgeon: Sherren Mocha, MD;  Location: [facility] CATH LAB;  Service: Cardiovascular;;  . rt.leg surgery  11/2010    Social history:  reports that she has never smoked. She has never used smokeless tobacco. She reports that she does not drink alcohol or use drugs.  Ambulation: Endplates without assistance at baseline.  Allergies  Allergen Reactions  . Peanut-Containing Drug Products Anaphylaxis and Swelling  . Azithromycin Other (See Comments)    REACTION: pt states INTOL to ZPak  . Flomax [Tamsulosin Hcl] Swelling  . Pioglitazone Other (See Comments)    edema  . Pregabalin Other (See Comments)    REACTION: pt states INTOL to Lyrica  . Sulfonamide Derivatives Swelling    Family History  Problem  Relation Age of Onset  . Stomach cancer Father   . Pneumonia Mother   . CAD Brother     Prior to Admission medications   Medication Sig Start Date End Date Taking? Authorizing Provider  acetaminophen (TYLENOL) 500 MG tablet Take 500 mg by mouth every 6 (six) hours as needed for mild pain. 03/15/13  Yes Dunn, Dayna N, PA-C  glipiZIDE (GLUCOTROL) 10 MG tablet TAKE TWO TABLETS BY MOUTH TWICE DAILY 03/28/16  Yes Noralee Space, MD  metFORMIN (GLUCOPHAGE) 500 MG tablet TAKE TWO TABLETS BY MOUTH TWICE DAILY WITH MEALS 05/30/16  Yes Noralee Space, MD  nitroGLYCERIN (NITROSTAT) 0.4 MG SL tablet Place 1 tablet (0.4 mg total) under the tongue every 5 (five) minutes as needed for chest pain (up to 3 doses). 03/15/13  Yes Dunn, Dayna N, PA-C  vitamin B-12 (CYANOCOBALAMIN) 1000 MCG tablet Take 1,000 mcg by mouth every morning.    Yes [provider]  atenolol (TENORMIN) 50 MG tablet TAKE ONE TABLET BY MOUTH ONCE DAILY Patient not taking: Reported on 04/25/2016 12/31/15   Evans Lance, MD  atorvastatin (LIPITOR) 40 MG tablet TAKE ONE TABLET BY MOUTH ONCE DAILY AT 6PM Patient not taking: Reported on 04/25/2016 03/22/16   Evans Lance, MD  Blood Glucose Monitoring Suppl (ACCU-CHEK AVIVA PLUS) w/Device KIT Test blood sugar up to two times daily 04/25/16   Teressa Lower  M, MD  diltiazem (CARTIA XT) 120 MG 24 hr capsule Take 1 capsule (120 mg total) by mouth daily. Patient not taking: Reported on 06/15/2016 02/22/16   Evans Lance, MD  famotidine (PEPCID) 20 MG tablet Take 1 tablet (20 mg total) by mouth at bedtime. Patient not taking: Reported on 04/26/2016 03/10/16   Noralee Space, MD  gabapentin (NEURONTIN) 100 MG capsule TAKE ONE CAPSULE BY MOUTH THREE TIMES DAILY Patient not taking: Reported on 06/15/2016 06/03/16   Noralee Space, MD  gabapentin (NEURONTIN) 100 MG capsule Take 1 capsule (100 mg total) by mouth 3 (three) times daily. Patient not taking: Reported on 06/15/2016 06/03/16   Noralee Space, MD    glucose blood (ACCU-CHEK AVIVA PLUS) test strip Test blood sugar up to two times daily 04/25/16   Noralee Space, MD  LANCETS ULTRA FINE MISC Test blood sugar up to two times daily 04/25/16   Noralee Space, MD  losartan (COZAAR) 25 MG tablet Take 1 tablet (25 mg total) by mouth daily. Patient not taking: Reported on 04/26/2016 03/22/16   Evans Lance, MD  omeprazole (PRILOSEC) 20 MG capsule Take 1 tablet by mouth 30 minutes before breakfast Patient not taking: Reported on 04/26/2016 03/10/16   Noralee Space, MD  oxybutynin (DITROPAN) 5 MG tablet Take 1 tablet (5 mg total) by mouth 2 (two) times daily. Patient not taking: Reported on 04/25/2016 02/25/15   Noralee Space, MD  Rivaroxaban (XARELTO) 15 MG TABS tablet Take 1 tablet (15 mg total) by mouth daily with supper. Patient not taking: Reported on 04/26/2016 03/22/16   Evans Lance, MD  traMADol (ULTRAM) 50 MG tablet Take 1 tablet (50 mg total) by mouth every 6 (six) hours as needed. Patient not taking: Reported on 04/26/2016 03/12/16   Drenda Freeze, MD    Physical Exam: Vitals:   06/15/16 0936 06/15/16 1209 06/15/16 1400 06/15/16 1512  BP: (!) 153/81 (!) 164/87 (!) 160/92 (!) 162/93  Pulse: 75 89 (!) 115 (!) 46  Resp:  16 (!) 25 20  Temp:    97.9 F (36.6 C)  TempSrc:    Axillary  SpO2:  100% 92% 93%  Weight:    50.4 kg (111 lb 1.8 oz)  Height:    5' 5"  (1.651 m)    Constitutional: NAD, calm, comfortable Vitals:   06/15/16 0936 06/15/16 1209 06/15/16 1400 06/15/16 1512  BP: (!) 153/81 (!) 164/87 (!) 160/92 (!) 162/93  Pulse: 75 89 (!) 115 (!) 46  Resp:  16 (!) 25 20  Temp:    97.9 F (36.6 C)  TempSrc:    Axillary  SpO2:  100% 92% 93%  Weight:    50.4 kg (111 lb 1.8 oz)  Height:    5' 5"  (1.651 m)   Eyes: PERRL, lids and conjunctivae normal ENMT: Dry mucous membranes, poor dentition, no tonsillar erythema Neck: normal, supple, no masses, no thyromegaly Respiratory: clear to auscultation bilaterally, no wheezing, no  crackles. Normal respiratory effort. No accessory muscle use.  Cardiovascular: Irregularly irregular rhythm, appreciate S1-S2 Abdomen: Tender to palpation in lower abdomen, no guarding or rebound tenderness, appreciate bowel sounds Musculoskeletal: no clubbing / cyanosis. No joint deformity upper and lower extremities. Good ROM, no contractures. Normal muscle tone.  Skin: no rashes, lesions, ulcers. No induration Neurologic: CN 2-12 grossly intact. Sensation intact, DTR normal. Strength 5/5 in all 4.  Psychiatric: Normal judgment and insight. Alert and oriented x 3. Normal mood.  Labs on Admission: I have personally reviewed following labs and imaging studies  CBC:  Recent Labs Lab 06/15/16 1003  WBC 6.1  HGB 11.2*  HCT 32.4*  MCV 95.0  PLT 680   Basic Metabolic Panel:  Recent Labs Lab 06/15/16 1003  NA 138  K 3.9  CL 99*  CO2 28  GLUCOSE 136*  BUN 15  CREATININE 1.01*  CALCIUM 9.5   GFR: Estimated Creatinine Clearance: 33.6 mL/min (A) (by C-G formula based on SCr of 1.01 mg/dL (H)). Liver Function Tests:  Recent Labs Lab 06/15/16 1003  AST 25  ALT 28  ALKPHOS 53  BILITOT 1.5*  PROT 6.8  ALBUMIN 3.9    Recent Labs Lab 06/15/16 1003  LIPASE 34   No results for input(s): AMMONIA in the last 168 hours. Coagulation Profile: No results for input(s): INR, PROTIME in the last 168 hours. Cardiac Enzymes: No results for input(s): CKTOTAL, CKMB, CKMBINDEX, TROPONINI in the last 168 hours. BNP (last 3 results) No results for input(s): PROBNP in the last 8760 hours. HbA1C: No results for input(s): HGBA1C in the last 72 hours. CBG: No results for input(s): GLUCAP in the last 168 hours. Lipid Profile: No results for input(s): CHOL, HDL, LDLCALC, TRIG, CHOLHDL, LDLDIRECT in the last 72 hours. Thyroid Function Tests: No results for input(s): TSH, T4TOTAL, FREET4, T3FREE, THYROIDAB in the last 72 hours. Anemia Panel: No results for input(s): VITAMINB12, FOLATE,  FERRITIN, TIBC, IRON, RETICCTPCT in the last 72 hours. Urine analysis:    Component Value Date/Time   COLORURINE YELLOW 06/15/2016 0939   APPEARANCEUR CLEAR 06/15/2016 0939   LABSPEC 1.008 06/15/2016 0939   PHURINE 7.5 06/15/2016 0939   GLUCOSEU NEGATIVE 06/15/2016 0939   GLUCOSEU NEGATIVE 10/14/2008 1148   HGBUR SMALL (A) 06/15/2016 0939   BILIRUBINUR NEGATIVE 06/15/2016 0939   KETONESUR NEGATIVE 06/15/2016 0939   PROTEINUR NEGATIVE 06/15/2016 0939   UROBILINOGEN 0.2 02/05/2011 1117   NITRITE NEGATIVE 06/15/2016 0939   LEUKOCYTESUR NEGATIVE 06/15/2016 0939   Sepsis Labs: @LABRCNTIP (procalcitonin:4,lacticidven:4) )No results found for this or any previous visit (from the past 240 hour(s)).   Radiological Exams on Admission: Ct Abdomen Pelvis W Contrast Result Date: 06/15/2016 1. Interval development of segmental areas of non perfusion in the right kidney and segmental differential perfusion in the left kidney. Given the appearance of non perfusion in the right kidney, embolic disease to the kidneys is favored over segmental edema from pyelonephritis. 2. Stable appearance of dominant cystic lesion in the uncinate process of the pancreas. 3. Abdominal aortic atherosclerosis. 4. Left colonic diverticulosis without diverticulitis.    EKG: pending   Assessment/Plan  Principal Problem:   Lower abdominal pain / SIRS / Possible renal infarction versus embolic disease - Unclear etiology, possible pyelonephritis versus renal infarction based on the CT scan. CT scan showed no perfusion to the right kidney and embolic disease favored over edema from pyelonephritis. Patient however meets SIRS criteria and apparently has had low-grade fevers at home. Her urinalysis does not show UTI however she has lower abdominal pain that radiates to the back and is concerning for possible pyelonephritis - Start Rocephin empirically while awaiting urine culture results - I spoke with surgery in regards to the  findings on CT scan. They recommended consulted nephrology. I spoke with nephrology Dr. Joelyn Oms as well. His recommendation was to investigate for thrombotic events. Patient does have history of atrial fibrillation and she could possibly be throwing a blood clot from the heart. - Patient apparently was on  xarelto for atrial fibrillation however apparently is not taking this because of epistaxis - 2-D echo ordered - Order placed for aspirin. Will monitor CBC and monitor for epistaxis. If no bleeding then consider starting perhaps heparin drip and monitoring for epistaxis. If she develops bleeding then we note that she is not a candidate for anticoagulation and would suggest palliative care consultation.    Active Problems:   Essential hypertension - Patient apparently not taking Cardizem or atenolol - Blood pressure is 162/93 so we'll start Norvasc 5 mg daily instead of Cardizem or atenolol because her heart rate is 46 at this time    Permanent atrial fibrillation (HCC) - CHADS vasc score    AKI (acute kidney injury) (HCC) - Likely prerenal, dehydration - Continue IV fluids - Follow up BMP in mm    Diabetes mellitus with diabetic neuropathy (HCC)  - Hold glipizide and metformin and use sliding scale insulin for now    DVT prophylaxis: SCDs bilaterally Code Status: Full code Family Communication: No family at bedside Disposition Plan: Admission to telemetry Consults called: None Admission status: Inpatient, patient is at moderate risk for clinical deterioration. Patient admitted to hospital for evaluation of abdominal pain, concerning for embolic disease based on CT scan finding and patient's history of atrial fibrillation. Patient requires 2-D echo. In addition there is a concern that patient may have possible pyelonephritis so she was started empirically on IV Rocephin.    Leisa Lenz MD [facility] Pager [phone]  If 7PM-7AM, please contact  night-coverage [email or web address] Password [email or web address]  06/15/2016, 4:21 PM

## 2016-06-15 NOTE — ED Notes (Signed)
ED Provider at bedside. 

## 2016-06-15 NOTE — ED Notes (Signed)
amb to BR with one assist w/o difficulty

## 2016-06-15 NOTE — ED Notes (Signed)
Patient transported to CT 

## 2016-06-16 LAB — CBC
HCT: 35.4 % — ABNORMAL LOW (ref 36.0–46.0)
HEMOGLOBIN: 12.3 g/dL (ref 12.0–15.0)
MCH: 31.9 pg (ref 26.0–34.0)
MCHC: 34.7 g/dL (ref 30.0–36.0)
MCV: 91.9 fL (ref 78.0–100.0)
PLATELETS: 198 10*3/uL (ref 150–400)
RBC: 3.85 MIL/uL — AB (ref 3.87–5.11)
RDW: 13.7 % (ref 11.5–15.5)
WBC: 8.2 10*3/uL (ref 4.0–10.5)

## 2016-06-16 LAB — PROTIME-INR
INR: 1.09
PROTHROMBIN TIME: 14.1 s (ref 11.4–15.2)

## 2016-06-16 LAB — GLUCOSE, CAPILLARY
GLUCOSE-CAPILLARY: 109 mg/dL — AB (ref 65–99)
GLUCOSE-CAPILLARY: 196 mg/dL — AB (ref 65–99)
Glucose-Capillary: 140 mg/dL — ABNORMAL HIGH (ref 65–99)
Glucose-Capillary: 168 mg/dL — ABNORMAL HIGH (ref 65–99)

## 2016-06-16 LAB — BASIC METABOLIC PANEL
ANION GAP: 10 (ref 5–15)
BUN: 11 mg/dL (ref 6–20)
CO2: 28 mmol/L (ref 22–32)
CREATININE: 1.07 mg/dL — AB (ref 0.44–1.00)
Calcium: 9.2 mg/dL (ref 8.9–10.3)
Chloride: 97 mmol/L — ABNORMAL LOW (ref 101–111)
GFR, EST AFRICAN AMERICAN: 54 mL/min — AB (ref >60)
GFR, EST NON AFRICAN AMERICAN: 47 mL/min — AB (ref >60)
Glucose, Bld: 139 mg/dL — ABNORMAL HIGH (ref 65–99)
Potassium: 3.5 mmol/L (ref 3.5–5.1)
SODIUM: 135 mmol/L (ref 135–145)

## 2016-06-16 LAB — APTT: aPTT: 28 seconds (ref 24–36)

## 2016-06-16 MED ORDER — ASPIRIN EC 81 MG PO TBEC: 81.0000 mg | DELAYED_RELEASE_TABLET | Freq: Every day | ORAL | Status: DC

## 2016-06-16 MED ORDER — ATENOLOL 50 MG PO TABS
50.0000 mg | ORAL_TABLET | Freq: Every day | ORAL | Status: DC
  Administered 2016-06-16 – 2016-06-22 (×7): 50 mg via ORAL
  Filled 2016-06-16 (×7): qty 1

## 2016-06-16 MED ORDER — HEPARIN BOLUS VIA INFUSION
2000.0000 [IU] | Freq: Once | INTRAVENOUS | Status: AC
  Administered 2016-06-16: 2000 [IU] via INTRAVENOUS
  Filled 2016-06-16: qty 2000

## 2016-06-16 MED ORDER — SALINE SPRAY 0.65 % NA SOLN
1.0000 | NASAL | Status: DC | PRN
  Filled 2016-06-16: qty 44

## 2016-06-16 MED ORDER — LABETALOL HCL 5 MG/ML IV SOLN
5.0000 mg | INTRAVENOUS | Status: DC | PRN
  Filled 2016-06-16: qty 4

## 2016-06-16 MED ORDER — HEPARIN (PORCINE) IN NACL 100-0.45 UNIT/ML-% IJ SOLN
900.0000 [IU]/h | INTRAMUSCULAR | Status: DC
  Administered 2016-06-16 – 2016-06-17 (×2): 800 [IU]/h via INTRAVENOUS
  Administered 2016-06-21: 900 [IU]/h via INTRAVENOUS
  Filled 2016-06-16 (×6): qty 250

## 2016-06-16 MED ORDER — PANTOPRAZOLE SODIUM 40 MG PO TBEC
40.0000 mg | DELAYED_RELEASE_TABLET | Freq: Every day | ORAL | Status: DC
  Administered 2016-06-16 – 2016-06-22 (×7): 40 mg via ORAL
  Filled 2016-06-16 (×7): qty 1

## 2016-06-16 MED ORDER — ATORVASTATIN CALCIUM 40 MG PO TABS
40.0000 mg | ORAL_TABLET | Freq: Every day | ORAL | Status: DC
  Administered 2016-06-16 – 2016-06-21 (×6): 40 mg via ORAL
  Filled 2016-06-16 (×6): qty 1

## 2016-06-16 MED ORDER — DILTIAZEM HCL ER COATED BEADS 120 MG PO CP24
120.0000 mg | ORAL_CAPSULE | Freq: Every day | ORAL | Status: DC
  Administered 2016-06-16 – 2016-06-19 (×4): 120 mg via ORAL
  Filled 2016-06-16 (×4): qty 1

## 2016-06-16 NOTE — Care Management Note (Signed)
Case Management Note  Patient Details  Name: Michelle Herrera MRN: [id number] Date of Birth: Feb 28, 1930  Subjective/Objective: 83 y/o f admitted w/lower abd pain.Hx: afib. From home.Patient poor historian. Spoke to son [name]-contact person c#[phone]-works in facilities @ [employer]-MD notified of contact person info. PT cons-await recc.                   Action/Plan:d/c plan likely SNF.   Expected Discharge Date:   (unknown)               Expected Discharge Plan:  Skilled Nursing Facility  In-House Referral:  Clinical Social Work  Discharge planning Services  CM Consult  Post Acute Care Choice:    Choice offered to:     DME Arranged:    DME Agency:     HH Arranged:    [place] Agency:     Status of Service:  In process, will continue to follow  If discussed at Long Length of Stay Meetings, dates discussed:    Additional Comments:  Dessa Phi, RN 06/16/2016, 12:21 PM

## 2016-06-16 NOTE — Progress Notes (Signed)
PROGRESS NOTE    Michelle Herrera  LFY:[id number] DOB: 22-Feb-1930 DOA: 06/15/2016 PCP: Noralee Space, MD    Brief Narrative:  83 y.o. female with medical history significant for diabetes mellitus, atrial fibrillation, was apparently on anticoagulation with xarelto but this was stopped because of epistaxis, further history off hypertension pyelonephritis. [facility] with worsening lower abdominal pain which patient reports it has been going on for last couple of weeks but she did not have anybody to take her to the hospital until today. Her pain was present at rest and with movement, 10 out of 10 in intensity at worst. Pain would radiate to the back bilaterally. Pain is sharp, constant and her appetite diminished because of the pain. She doesn't know if she lost any weight over that amount of time since the pain started. Patient reports low-grade fevers at home. No chills. She has some nausea but no vomiting. No blood in the urine. No chest pain, shortness of breath or palpitations. No loss of consciousness or falls.  ED Course: In ED, heart rate was 46, respiratory rate 25, blood pressure 148/112, oxygen saturation 92% on room air. Blood work was notable for hemoglobin of 11.2, creatinine 1.01. CT abdomen was suspicious for embolic disease in the kidneys, right kidney specifically versus possible pyelonephritis. Patient was started empirically on Rocephin while awaiting urine culture results.  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 Lenna Gilford reviewed. Patient has an extensive history of gross medical noncompliance, with concern  for memory loss. Per Dr. Lenna Gilford, "Chellie continues to treat herself by taking the meds as she wants & not as prescribed; she has also tied my hands as she will not take any of the newed (more expensive) DM meds and wil not agree to insulin rx; finally I have tried to refer her to endocrine/ DM specialist but she has repeatedly refused to go" -Patient has hx or renal infarction known as far ast 1/18, thought to be secondary to subtherapeutic INR from noncompliance with coumadin. Pt was subsequently transitioned to NOAC, however has not been taking. At this visit, patient claims to have been told to stop ASA and NOAC for an unknown reason, and instead, take tylenol daily. -Patient claims to have black stools. Hgb stable. Will heme check stools. -Continue patient on ASA alone for now. If no signs of active GI bleed, then would start heparin gtt. And if tolerates, then would resume NOAC -Pt was started on rocephin empirically. Will continue for now  Active Problems:   Essential hypertension - Patient apparently not taking Cardizem or atenolol - Have resumed home medications -See above regarding gross noncompliance    Permanent atrial fibrillation ([place]) - Per outpatient records, patient noncompliant to anticoagulants including NOAC -This AM rate controlled -Have resumed home meds -Of note, patient claims to be taking only atenolol as it is the only medication she found in the house    AKI (acute kidney injury) ([place]) - Likely prerenal, dehydration - Continue IV fluids - repeat bmet in AM - Stable    Diabetes mellitus with diabetic neuropathy (HCC)  - Hold glipizide and metformin and use sliding scale insulin for  now - Outpatient records reviewed. See above. Has been grossly noncompliant with diabetes regimen  Memory loss - On exam, patient appears quite confused and forgetful - Per outpt records, pt has hx of on-going memory loss and has refused medications for dementia - Given overall  picture, suspect worsening dementia - PT/OT consulted for d/c planning  DVT prophylaxis: SCD's Code Status: Full Family Communication: Pt  In room, family not at bedside Disposition Plan: Uncertain at this time  Consultants:     Procedures:     Antimicrobials: Anti-infectives    Start     Dose/Rate Route Frequency Ordered Stop   06/15/16 1800  cefTRIAXone (ROCEPHIN) 1 g in dextrose 5 % 50 mL IVPB     1 g 100 mL/hr over 30 Minutes Intravenous Every 24 hours 06/15/16 1617         Subjective: Reports feeling well at this time  Objective: Vitals:   06/15/16 1400 06/15/16 1512 06/15/16 2119 06/16/16 0425  BP: (!) 160/92 (!) 162/93 (!) 168/93 (!) 145/84  Pulse: (!) 115 (!) 46 (!) 104 97  Resp: (!) 25 20 20 20   Temp:  97.9 F (36.6 C) 98.8 F (37.1 C) 97.8 F (36.6 C)  TempSrc:  Axillary Oral Oral  SpO2: 92% 93% 92% 97%  Weight:  50.4 kg (111 lb 1.8 oz)  50.4 kg (111 lb 1.6 oz)  Height:  5\' 5"  (1.651 m)      Intake/Output Summary (Last 24 hours) at 06/16/16 1713 Last data filed at 06/16/16 1407  Gross per 24 hour  Intake          1481.25 ml  Output              600 ml  Net           881.25 ml   Filed Weights   06/15/16 0926 06/15/16 1512 06/16/16 0425  Weight: 52.2 kg (115 lb) 50.4 kg (111 lb 1.8 oz) 50.4 kg (111 lb 1.6 oz)    Examination:  General exam: Appears calm and comfortable  Respiratory system: Clear to auscultation. Respiratory effort normal. Cardiovascular system: S1 & S2 heard, RRR. No JVD, murmurs, rubs, gallops or clicks. No pedal edema. Gastrointestinal system: Abdomen is nondistended, soft and nontender. No organomegaly or masses felt. Normal bowel sounds heard. Central nervous system: Alert and oriented. No focal neurological deficits. Extremities: Symmetric 5 x 5 power. Skin: No rashes, lesions  Psychiatry: Recent memory, judgement poor. Mood & affect appropriate.   Data Reviewed: I have personally reviewed following labs and imaging  studies  CBC:  Recent Labs Lab 06/15/16 1003 06/16/16 0808  WBC 6.1 8.2  HGB 11.2* 12.3  HCT 32.4* 35.4*  MCV 95.0 91.9  PLT 184 478   Basic Metabolic Panel:  Recent Labs Lab 06/15/16 1003 06/16/16 0808  NA 138 135  K 3.9 3.5  CL 99* 97*  CO2 28 28  GLUCOSE 136* 139*  BUN 15 11  CREATININE 1.01* 1.07*  CALCIUM 9.5 9.2   GFR: Estimated Creatinine Clearance: 31.7 mL/min (A) (by C-G formula based on SCr of 1.07 mg/dL (H)). Liver Function Tests:  Recent Labs Lab 06/15/16 1003  AST 25  ALT 28  ALKPHOS 53  BILITOT 1.5*  PROT 6.8  ALBUMIN 3.9    Recent Labs Lab 06/15/16 1003  LIPASE 34   No results for input(s): AMMONIA in the last 168 hours. Coagulation Profile: No results for input(s): INR, PROTIME in the last 168 hours. Cardiac Enzymes: No results  for input(s): CKTOTAL, CKMB, CKMBINDEX, TROPONINI in the last 168 hours. BNP (last 3 results) No results for input(s): PROBNP in the last 8760 hours. HbA1C: No results for input(s): HGBA1C in the last 72 hours. CBG:  Recent Labs Lab 06/15/16 1653 06/15/16 2116 06/16/16 0718 06/16/16 1144 06/16/16 1629  GLUCAP 113* 118* 109* 196* 140*   Lipid Profile: No results for input(s): CHOL, HDL, LDLCALC, TRIG, CHOLHDL, LDLDIRECT in the last 72 hours. Thyroid Function Tests: No results for input(s): TSH, T4TOTAL, FREET4, T3FREE, THYROIDAB in the last 72 hours. Anemia Panel: No results for input(s): VITAMINB12, FOLATE, FERRITIN, TIBC, IRON, RETICCTPCT in the last 72 hours. Sepsis Labs: No results for input(s): PROCALCITON, LATICACIDVEN in the last 168 hours.  No results found for this or any previous visit (from the past 240 hour(s)).   Radiology Studies: Ct Abdomen Pelvis W Contrast  Result Date: 06/15/2016 CLINICAL DATA:  Abdominal pain with tenderness and nausea. EXAM: CT ABDOMEN AND PELVIS WITH CONTRAST TECHNIQUE: Multidetector CT imaging of the abdomen and pelvis was performed using the standard  protocol following bolus administration of intravenous contrast. CONTRAST:  115mL ISOVUE-300 IOPAMIDOL (ISOVUE-300) INJECTION 61% COMPARISON:  03/12/2016.  MRI abdomen 03/18/2016 FINDINGS: Lower chest: Heart is enlarged. Stable appearance lingular scarring. Hepatobiliary: Liver perfusion heterogeneous. Gallbladder surgically absent. No intrahepatic or extrahepatic biliary dilation. Pancreas: 10 mm cystic lesion in the head of the pancreas is similar to prior. Other tiny cystic foci in the region of the pancreatic head are better seen on previous MRI. No dilatation of the main pancreatic duct. Spleen: No splenomegaly. No focal mass lesion. Adrenals/Urinary Tract: No adrenal nodule or mass. Multiple areas of segmental non perfusion are identified in the right kidney. Imaging features are more suggestive of embolic disease than edema from pyelonephritis. Marked differential perfusion also identified in the left kidney. No hydroureteronephrosis. The urinary bladder appears normal for the degree of distention. Stomach/Bowel: Stomach is nondistended. No gastric wall thickening. No evidence of outlet obstruction. Duodenum is normally positioned as is the ligament of Treitz. No small bowel wall thickening. No small bowel dilatation. The terminal ileum is normal. The appendix is not visualized, but there is no edema or inflammation in the region of the cecum. Diverticular changes are noted in the left colon without evidence of diverticulitis. Vascular/Lymphatic: There is abdominal aortic atherosclerosis without aneurysm. There is no gastrohepatic or hepatoduodenal ligament lymphadenopathy. No intraperitoneal or retroperitoneal lymphadenopathy. No pelvic sidewall lymphadenopathy. Reproductive: Calcified fibroids are identified in the uterus. Other: There is no adnexal mass. Musculoskeletal: Bones are diffusely demineralized. IMPRESSION: 1. Interval development of segmental areas of non perfusion in the right kidney and  segmental differential perfusion in the left kidney. Given the appearance of non perfusion in the right kidney, embolic disease to the kidneys is favored over segmental edema from pyelonephritis. 2. Stable appearance of dominant cystic lesion in the uncinate process of the pancreas. 3. Abdominal aortic atherosclerosis. 4. Left colonic diverticulosis without diverticulitis. Electronically Signed   By: Misty Stanley M.D.   On: 06/15/2016 11:55    Scheduled Meds: . [START ON 06/17/2016] aspirin EC  81 mg Oral Daily  . atenolol  50 mg Oral Daily  . atorvastatin  40 mg Oral q1800  . diltiazem  120 mg Oral Daily  . insulin aspart  0-9 Units Subcutaneous TID WC  . pantoprazole  40 mg Oral Daily  . sodium chloride flush  3 mL Intravenous Q12H  . vitamin B-12  1,000 mcg Oral q morning - 10a  Continuous Infusions: . sodium chloride 75 mL/hr at 06/16/16 1705  . cefTRIAXone (ROCEPHIN)  IV 1 g (06/16/16 1704)     LOS: 1 day   CHIU, Orpah Melter, MD [facility] Pager [phone]  If 7PM-7AM, please contact night-coverage [email or web address] Password [email or web address] 06/16/2016, 5:13 PM

## 2016-06-16 NOTE — Progress Notes (Signed)
[place] for heparin IV Indication: bilateral renal embolism  Allergies  Allergen Reactions  . Peanut-Containing Drug Products Anaphylaxis and Swelling  . Azithromycin Other (See Comments)    REACTION: pt states INTOL to ZPak  . Flomax [Tamsulosin Hcl] Swelling  . Pioglitazone Other (See Comments)    edema  . Pregabalin Other (See Comments)    REACTION: pt states INTOL to Lyrica  . Sulfonamide Derivatives Swelling    Patient Measurements: Height: 5' 5"  (165.1 cm) Weight: 111 lb 1.6 oz (50.4 kg) IBW/kg (Calculated) : 57 Heparin Dosing Weight: TBW  Vital Signs:    Labs:  Recent Labs  06/15/16 1003 06/16/16 0808  HGB 11.2* 12.3  HCT 32.4* 35.4*  PLT 184 198  CREATININE 1.01* 1.07*    Estimated Creatinine Clearance: 31.7 mL/min (A) (by C-G formula based on SCr of 1.07 mg/dL (H)).   Medical History: Past Medical History:  Diagnosis Date  . AF (atrial fibrillation) ([place])    a. Chronic Xarelto  . Anxiety state, unspecified   . Benign neoplasm of colon   . CAD (coronary artery disease)    a. 05/2012 NSTEMI/Cath/PCI: LM nl, LAD min irregs, LCX min irregs, RI 40-50 ost, OM1 nl, RCA dom, 3m(4.0x12 [facility] BMS), PD/PL nl, EF 55%, at least mod MR. b. NSTEMI 03/2013 - cath with widely patent coronaries, suspect demand ischemia due to RVR.  .Marland KitchenChronic combined systolic and diastolic CHF (congestive heart failure) ([place]    a. 01/2011 Echo: EF 40-45%, diff HK, diast dysfxn, mild to mod MR, mod to sev dil LA/RV/RA, mod to sev reduced RV fxn, mod TR, PASP 444mg.;  b.  Echo 06/15/12:  EF 55%, mild MR, severe BAE, mod TR, PASP 35  . Diaphragmatic hernia without mention of obstruction or gangrene   . Epistaxis    a. s/p cauterization 11/2012.  . Marland Kitchenistory of stroke   . HTN (hypertension)   . Hx of cardiovascular stress test    a. Myoview in 2006 was negative for ischemia, EF 54%. (WMartin Majesticn to have CAD development 05/2012.)  . Hypomagnesemia   .  Lumbago   . Memory loss   . Mitral regurgitation    a. mild by echo 06/2012.  . Mixed incontinence urge and stress (female)(female)   . Osteoarthrosis, unspecified whether generalized or localized, unspecified site   . Osteoporosis, unspecified   . Pancreatic abnormality 04/25/2016  . Pure hypercholesterolemia   . Renal infarction ([place]   01/2011 in setting of low INR  . Type II or unspecified type diabetes mellitus without mention of complication, not stated as uncontrolled     Medications:  Prescriptions Prior to Admission  Medication Sig Dispense Refill Last Dose  . acetaminophen (TYLENOL) 500 MG tablet Take 500 mg by mouth every 6 (six) hours as needed for mild pain.   06/15/2016 at Unknown time  . glipiZIDE (GLUCOTROL) 10 MG tablet TAKE TWO TABLETS BY MOUTH TWICE DAILY 120 tablet 2 06/15/2016 at Unknown time  . metFORMIN (GLUCOPHAGE) 500 MG tablet TAKE TWO TABLETS BY MOUTH TWICE DAILY WITH MEALS 120 tablet 3 06/15/2016 at Unknown time  . nitroGLYCERIN (NITROSTAT) 0.4 MG SL tablet Place 1 tablet (0.4 mg total) under the tongue every 5 (five) minutes as needed for chest pain (up to 3 doses). 25 tablet 3 Not Taking  . vitamin B-12 (CYANOCOBALAMIN) 1000 MCG tablet Take 1,000 mcg by mouth every morning.    06/15/2016 at Unknown time  . atenolol (TENORMIN)  50 MG tablet TAKE ONE TABLET BY MOUTH ONCE DAILY (Patient not taking: Reported on 04/25/2016) 90 tablet 3 Completed Course at Unknown time  . atorvastatin (LIPITOR) 40 MG tablet TAKE ONE TABLET BY MOUTH ONCE DAILY AT 6PM (Patient not taking: Reported on 04/25/2016) 90 tablet 1 Completed Course at Unknown time  . Blood Glucose Monitoring Suppl (ACCU-CHEK AVIVA PLUS) w/Device KIT Test blood sugar up to two times daily 1 kit 0   . diltiazem (CARTIA XT) 120 MG 24 hr capsule Take 1 capsule (120 mg total) by mouth daily. (Patient not taking: Reported on 06/15/2016) 90 capsule 2 Completed Course at Unknown time  . famotidine (PEPCID) 20 MG tablet Take 1 tablet  (20 mg total) by mouth at bedtime. (Patient not taking: Reported on 04/26/2016) 30 tablet 0 Completed Course at Unknown time  . gabapentin (NEURONTIN) 100 MG capsule TAKE ONE CAPSULE BY MOUTH THREE TIMES DAILY (Patient not taking: Reported on 06/15/2016) 90 capsule 2 Completed Course at Unknown time  . gabapentin (NEURONTIN) 100 MG capsule Take 1 capsule (100 mg total) by mouth 3 (three) times daily. (Patient not taking: Reported on 06/15/2016) 90 capsule 5 Completed Course at Unknown time  . glucose blood (ACCU-CHEK AVIVA PLUS) test strip Test blood sugar up to two times daily 100 each 12 Taking  . LANCETS ULTRA FINE MISC Test blood sugar up to two times daily 100 each 11 Taking  . losartan (COZAAR) 25 MG tablet Take 1 tablet (25 mg total) by mouth daily. (Patient not taking: Reported on 04/26/2016) 90 tablet 1 Completed Course at Unknown time  . omeprazole (PRILOSEC) 20 MG capsule Take 1 tablet by mouth 30 minutes before breakfast (Patient not taking: Reported on 04/26/2016) 30 capsule 11 Completed Course at Unknown time  . oxybutynin (DITROPAN) 5 MG tablet Take 1 tablet (5 mg total) by mouth 2 (two) times daily. (Patient not taking: Reported on 04/25/2016) 60 tablet prn Completed Course at Unknown time  . Rivaroxaban (XARELTO) 15 MG TABS tablet Take 1 tablet (15 mg total) by mouth daily with supper. (Patient not taking: Reported on 04/26/2016) 90 tablet 1 Completed Course at Unknown time  . traMADol (ULTRAM) 50 MG tablet Take 1 tablet (50 mg total) by mouth every 6 (six) hours as needed. (Patient not taking: Reported on 04/26/2016) 15 tablet 0 Completed Course at Unknown time   Scheduled:  . atenolol  50 mg Oral Daily  . atorvastatin  40 mg Oral q1800  . diltiazem  120 mg Oral Daily  . insulin aspart  0-9 Units Subcutaneous TID WC  . pantoprazole  40 mg Oral Daily  . sodium chloride flush  3 mL Intravenous Q12H  . vitamin B-12  1,000 mcg Oral q morning - 10a    Assessment: 41F with PMH DM, AFib no  longer on Xarelto, HTN, admitted for pyelonephritis vs previously known bilateral renal emboli. Reported black stools prior to admission, but FOBT negative. Now holding ASA and starting heparin IV per pharmacy with plans to transition to oral anticoag in 24 hrs if tolerating.   Baseline INR, aPTT: WNL  Prior anticoagulation: none, previously on Xarelto  Significant events:  Today, 06/16/2016:  CBC: wnl  No bleeding or infusion issues per nursing  CrCl ~30 ml/min, mostly d/t age and low weight  Goal of Therapy: Heparin level 0.3-0.7 units/ml Monitor platelets by anticoagulation protocol: Yes  Plan:  Heparin 2000 units IV bolus x 1  Heparin 800 units/hr IV infusion  Check heparin level 8  hrs after start  Daily CBC, daily heparin level once stable  Monitor for signs of bleeding or thrombosis   Reuel Boom, PharmD Pager: [phone] 06/16/2016, 5:36 PM

## 2016-06-17 LAB — GLUCOSE, CAPILLARY
GLUCOSE-CAPILLARY: 152 mg/dL — AB (ref 65–99)
GLUCOSE-CAPILLARY: 133 mg/dL — AB (ref 65–99)
Glucose-Capillary: 245 mg/dL — ABNORMAL HIGH (ref 65–99)
Glucose-Capillary: 84 mg/dL (ref 65–99)

## 2016-06-17 LAB — HEPARIN LEVEL (UNFRACTIONATED)
HEPARIN UNFRACTIONATED: 0.33 [IU]/mL (ref 0.30–0.70)
Heparin Unfractionated: 0.42 IU/mL (ref 0.30–0.70)

## 2016-06-17 LAB — CBC
HCT: 33.4 % — ABNORMAL LOW (ref 36.0–46.0)
HEMOGLOBIN: 11.8 g/dL — AB (ref 12.0–15.0)
MCH: 32.9 pg (ref 26.0–34.0)
MCHC: 35.3 g/dL (ref 30.0–36.0)
MCV: 93 fL (ref 78.0–100.0)
Platelets: 189 10*3/uL (ref 150–400)
RBC: 3.59 MIL/uL — ABNORMAL LOW (ref 3.87–5.11)
RDW: 13.6 % (ref 11.5–15.5)
WBC: 11.1 10*3/uL — AB (ref 4.0–10.5)

## 2016-06-17 LAB — HEMOGLOBIN A1C
HEMOGLOBIN A1C: 6.2 % — AB (ref 4.8–5.6)
Mean Plasma Glucose: 131 mg/dL

## 2016-06-17 LAB — URINE CULTURE

## 2016-06-17 MED ORDER — ADULT MULTIVITAMIN W/MINERALS CH
1.0000 | ORAL_TABLET | Freq: Every day | ORAL | Status: DC
  Administered 2016-06-17 – 2016-06-22 (×6): 1 via ORAL
  Filled 2016-06-17 (×6): qty 1

## 2016-06-17 MED ORDER — WARFARIN - PHARMACIST DOSING INPATIENT: Freq: Every day | Status: DC

## 2016-06-17 MED ORDER — WARFARIN SODIUM 4 MG PO TABS
4.0000 mg | ORAL_TABLET | Freq: Once | ORAL | Status: AC
  Administered 2016-06-17: 4 mg via ORAL
  Filled 2016-06-17 (×2): qty 1

## 2016-06-17 MED ORDER — WARFARIN VIDEO
Freq: Once | Status: AC
  Administered 2016-06-18: 10:00:00

## 2016-06-17 MED ORDER — ENSURE ENLIVE PO LIQD
237.0000 mL | Freq: Two times a day (BID) | ORAL | Status: DC
  Administered 2016-06-17 – 2016-06-22 (×8): 237 mL via ORAL

## 2016-06-17 MED ORDER — COUMADIN BOOK
Freq: Once | Status: AC
  Administered 2016-06-18: 10:00:00
  Filled 2016-06-17: qty 1

## 2016-06-17 NOTE — Progress Notes (Signed)
[place] for Heparin IV + warfarin Indication: bilateral renal embolism  Allergies  Allergen Reactions  . Peanut-Containing Drug Products Anaphylaxis and Swelling  . Azithromycin Other (See Comments)    REACTION: pt states INTOL to ZPak  . Flomax [Tamsulosin Hcl] Swelling  . Pioglitazone Other (See Comments)    edema  . Pregabalin Other (See Comments)    REACTION: pt states INTOL to Lyrica  . Sulfonamide Derivatives Swelling    Patient Measurements: Height: 5\' 5"  (165.1 cm) Weight: 113 lb 12.1 oz (51.6 kg) IBW/kg (Calculated) : 57 Heparin Dosing Weight: TBW  Vital Signs: Temp: 98.3 F (36.8 C) (06/08 1451) Temp Source: Oral (06/08 1451) BP: 115/64 (06/08 1451) Pulse Rate: 66 (06/08 1451)  Labs:  Recent Labs  06/15/16 1003 06/16/16 0808 06/16/16 1748 06/17/16 0243 06/17/16 1059  HGB 11.2* 12.3  --  11.8*  --   HCT 32.4* 35.4*  --  33.4*  --   PLT 184 198  --  189  --   APTT  --   --  28  --   --   LABPROT  --   --  14.1  --   --   INR  --   --  1.09  --   --   HEPARINUNFRC  --   --   --  0.33 0.42  CREATININE 1.01* 1.07*  --   --   --     Estimated Creatinine Clearance: 32.5 mL/min (A) (by C-G formula based on SCr of 1.07 mg/dL (H)).    Medications:  . sodium chloride 75 mL/hr at 06/17/16 1221  . cefTRIAXone (ROCEPHIN)  IV Stopped (06/16/16 1848)  . heparin 800 Units/hr (06/16/16 1847)    Assessment: 40 yoF admitted on 6/6 with lower abdominal pain.  CT abdomen was suspicious for embolic disease in the kidneys, right kidney specifically versus possible pyelonephritis.  PMH includes HTN, DM, AFib no longer on Xarelto, previously known bilateral renal emboli. Reported black stools prior to admission, but FOBT negative. She tolerated low dose ASA without bleeding, and pharmacy is now consulted to dose Heparin IV.     Baseline INR, aPTT: WNL  Prior anticoagulation: none, previously on Xarelto  Significant  events: 6/8: patient and family agree to start warfarin to manage renal emboli   Today, 06/17/2016:  Heparin level 0.42, remains therapeutic  CBC: Hgb slightly decreased to 11.8, Plt WNL  No bleeding or infusion issues per nursing  Drug-drug interactions: none major, on Rocephin  SCr 1.07, CrCl ~32 ml/min, low mostly d/t age and low weight  Negligible PO intake per RN charting, would clarify with patient if this is accurate  Goal of Therapy: Heparin level 0.3-0.7 units/ml INR 2-3 Monitor platelets by anticoagulation protocol: Yes   Plan:  Warfarin 4 mg tonight x 1 (lower dose d/t age > 96 and low weight)  Continue Heparin 800 units/hr IV infusion  Daily INR, CBC and heparin level  Monitor for signs of bleeding or thrombosis  Suggested duration of heparin-warfarin overlap is 5 days AND until at least 2 consecutive therapeutic INRs achieved  Reuel Boom, PharmD, BCPS Pager: [phone] 06/17/2016, 3:16 PM

## 2016-06-17 NOTE — Progress Notes (Signed)
Initial Nutrition Assessment  DOCUMENTATION CODES:   Not applicable  INTERVENTION:  - Will order Ensure Enlive po BID, each supplement provides 350 kcal and 20 grams of protein - Will order daily multivitamin with minerals.  - Recommend liberalization to Regular diet. - RD will continue to monitor for additional nutrition-related needs.  NUTRITION DIAGNOSIS:   Inadequate oral intake related to lethargy/confusion, poor appetite as evidenced by per patient/family report, meal completion < 50%.  GOAL:   Patient will meet greater than or equal to 90% of their needs  MONITOR:   PO intake, Supplement acceptance, Weight trends, Labs  REASON FOR ASSESSMENT:   Other (Comment) (Low BMI)  ASSESSMENT:   83 y.o. female with medical history significant for diabetes mellitus, atrial fibrillation, was apparently on anticoagulation with xarelto but this was stopped because of epistaxis, further history off hypertension pyelonephritis. [facility] with worsening lower abdominal pain which patient reports it has been going on for last couple of weeks but she did not have anybody to take her to the hospital until today. Her pain was present at rest and with movement, 10 out of 10 in intensity at worst. Pain would radiate to the back bilaterally. Pain is sharp, constant and her appetite diminished because of the pain. She doesn't know if she lost any weight over that amount of time since the pain started.   BMI indicates normal weight/borderline underweight. Per review, she consumed 90% of breakfast and 5% of lunch yesterday and 5% of breakfast this AM (pt reports this was scrambled eggs, bacon, and coffee). Daughter-in-law, who is at bedside, reports that pt is a picky eater. Pt states her son often brings her food such as potato soup, chicken tenders, hamburgers. She was also drinking Ensure one-two times/day PTA and would like to receive this supplement during admission.  Reviewed MD note from yesterday which states noncompliance with DM medications and recommendation for medication for dementia. Pt appropriate in conversation during RD visit although somewhat HOH. She is missing several front teeth on the upper and lower. She has an upper partial but only wears it when she goes out. She denies any difficulties with foods when she is not wearing partial.   Physical assessment shows mild/moderate muscle wasting to shoulder and clavicle area but no muscle or fat wasting to any other areas. Pt unsure of weight changes PTA. Per chart review, she has lost 7 lbs (6% body weight) in the past 3 months which is not significant for time frame. Pt does not meet criteria for malnutrition at this time but will continue to monitor.  Medications reviewed; sliding scale Novolog, 40 mg oral Protonix/day, 1000 mcg oral vitamin B12/day. Labs reviewed; CBG: 152 mg/dL this AM, Cl: 97 mmol/L, creatinine: 1.07 mg/dL, GFR: 47 mL/min.  IVF: NS @ 75 mL/hr.    Diet Order:  Diet Carb Modified Fluid consistency: Thin; Room service appropriate? Yes  Skin:  Reviewed, no issues  Last BM:  6/5  Height:   Ht Readings from Last 1 Encounters:  06/15/16 5\' 5"  (1.651 m)    Weight:   Wt Readings from Last 1 Encounters:  06/17/16 113 lb 12.1 oz (51.6 kg)    Ideal Body Weight:  56.82 kg  BMI:  Body mass index is 18.93 kg/m.  Estimated Nutritional Needs:   Kcal:  1290-1445 (25-28 kcal/kg)  Protein:  52-62 grams (1-1.2 garms/kg)  Fluid:  >/= 1.3 L/day  EDUCATION NEEDS:   No education needs identified  at this time    Jarome Matin, MS, RD, LDN, [facility] Inpatient Clinical Dietitian Pager # [phone] After hours/weekend pager # [phone]

## 2016-06-17 NOTE — Evaluation (Signed)
Physical Therapy Evaluation Patient Details Name: Michelle Herrera MRN: [id number] DOB: [birth year] Today's Date: 06/17/2016   History of Present Illness  83 yo female admitted with abdominal pain. Hx of A fib, DM, CVA, CAD, OA, osteoporosis, memory loss  Clinical Impression  On eval, pt was supervision level assist for mobility. She walked ~125 feet with a RW. Pt tolerated activity well. Minimal pain reported during session. Pt and visitor present state pt has supervision from sons. Do not anticipate any follow up PT needs at discharge. Will follow during hospital stay.     Follow Up Recommendations No PT follow up;Supervision - Intermittent    Equipment Recommendations  None recommended by PT    Recommendations for Other Services       Precautions / Restrictions Precautions Precautions: Fall Restrictions Weight Bearing Restrictions: No      Mobility  Bed Mobility Overal bed mobility: Modified Independent                Transfers Overall transfer level: Needs assistance Equipment used: Rolling walker (2 wheeled) Transfers: Sit to/from Stand Sit to Stand: Supervision         General transfer comment: for safety, hand placement  Ambulation/Gait Ambulation/Gait assistance: Supervision Ambulation Distance (Feet): 125 Feet Assistive device: Rolling walker (2 wheeled) Gait Pattern/deviations: Step-through pattern;Decreased stride length     General Gait Details: for safety.   Stairs            Wheelchair Mobility    Modified Rankin (Stroke Patients Only)       Balance                                             Pertinent Vitals/Pain Pain Assessment: Faces Faces Pain Scale: Hurts little more Pain Location: abdomen    Home Living Family/patient expects to be discharged to:: Private residence Living Arrangements: Children (son) Available Help at Discharge: Family Type of Home: House Home Access: Stairs to enter    Technical brewer of Steps: 1 step Home Layout: One level Home Equipment: Environmental consultant - 2 wheels      Prior Function Level of Independence: Independent with assistive device(s)         Comments: uses walker as needed     Hand Dominance        Extremity/Trunk Assessment   Upper Extremity Assessment Upper Extremity Assessment: Generalized weakness    Lower Extremity Assessment Lower Extremity Assessment: Generalized weakness    Cervical / Trunk Assessment Cervical / Trunk Assessment: Kyphotic  Communication   Communication: HOH  Cognition Arousal/Alertness: Awake/alert Behavior During Therapy: WFL for tasks assessed/performed Overall Cognitive Status: Within Functional Limits for tasks assessed                                        General Comments      Exercises     Assessment/Plan    PT Assessment Patient needs continued PT services  PT Problem List Decreased mobility;Decreased activity tolerance       PT Treatment Interventions Gait training;Therapeutic activities;Therapeutic exercise;Patient/family education;Functional mobility training    PT Goals (Current goals can be found in the Care Plan section)  Acute Rehab PT Goals Patient Stated Goal: none stated PT Goal Formulation: With patient Time For Goal Achievement: 07/01/16 Potential to  Achieve Goals: Good    Frequency Min 3X/week   Barriers to discharge        Co-evaluation               AM-PAC PT "6 Clicks" Daily Activity  Outcome Measure Difficulty turning over in bed (including adjusting bedclothes, sheets and blankets)?: None Difficulty moving from lying on back to sitting on the side of the bed? : None Difficulty sitting down on and standing up from a chair with arms (e.g., wheelchair, bedside commode, etc,.)?: A Little Help needed moving to and from a bed to chair (including a wheelchair)?: A Little Help needed walking in hospital room?: A Little Help needed  climbing 3-5 steps with a railing? : A Little 6 Click Score: 20    End of Session Equipment Utilized During Treatment: Gait belt Activity Tolerance: Patient tolerated treatment well Patient left: in chair;with call bell/phone within reach;with family/visitor present   PT Visit Diagnosis: Muscle weakness (generalized) (M62.81);Difficulty in walking, not elsewhere classified (R26.2)    Time: 6834-1962 PT Time Calculation (min) (ACUTE ONLY): 13 min   Charges:   PT Evaluation $PT Eval Low Complexity: 1 Procedure     PT G Codes:        Weston Anna, MPT Pager: [phone]

## 2016-06-17 NOTE — Progress Notes (Signed)
Spoke to dtr in law who provided w/main contact person-Son-Charles(nickname-shorty [phone]-given this info to [facility].

## 2016-06-17 NOTE — Progress Notes (Signed)
PROGRESS NOTE    Michelle Herrera  [id number]:[id number] DOB: [birth year] DOA: 06/15/2016 PCP: Noralee Space, MD    Brief Narrative:  83 y.o. female with medical history significant for diabetes mellitus, atrial fibrillation, was apparently on anticoagulation with xarelto but this was stopped because of epistaxis, further history off hypertension pyelonephritis. [facility] with worsening lower abdominal pain which patient reports it has been going on for last couple of weeks but she did not have anybody to take her to the hospital until today. Her pain was present at rest and with movement, 10 out of 10 in intensity at worst. Pain would radiate to the back bilaterally. Pain is sharp, constant and her appetite diminished because of the pain. She doesn't know if she lost any weight over that amount of time since the pain started. Patient reports low-grade fevers at home. No chills. She has some nausea but no vomiting. No blood in the urine. No chest pain, shortness of breath or palpitations. No loss of consciousness or falls.  ED Course: In ED, heart rate was 46, respiratory rate 25, blood pressure 148/112, oxygen saturation 92% on room air. Blood work was notable for hemoglobin of 11.2, creatinine 1.01. CT abdomen was suspicious for embolic disease in the kidneys, right kidney specifically versus possible pyelonephritis. Patient was started empirically on Rocephin while awaiting urine culture results.  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 Lenna Gilford reviewed. Patient has an extensive history of gross medical noncompliance, with concern  for memory loss. Per Dr. Lenna Gilford, "Jazzma continues to treat herself by taking the meds as she wants & not as prescribed; she has also tied my hands as she will not take any of the newed (more expensive) DM meds and wil not agree to insulin rx; finally I have tried to refer her to endocrine/ DM specialist but she has repeatedly refused to go" -Patient has hx or renal infarction known as far ast 1/18, thought to be secondary to subtherapeutic INR from noncompliance with coumadin. Pt was subsequently transitioned to NOAC, however has not been taking. At this visit, patient claims to have been told to stop ASA and NOAC for an unknown reason, and instead, take tylenol daily. -Patient claims to have black stools. Hgb stable. Hemoccult negative -Tolerated heparin gtt overnight -See CM note. Given cost constraints, family has elected to transition to coumadin. Family is aware of necessity for following up for coumadin level checks and will be more involved in patient's care than before -Would benefit from [facility] to assist with coumadin monitoring -Plan for heparin/coumadin overlap x 5 days with 2 consecutive days of therapeutic INR -repeat CBC in AM  Active Problems:   Essential hypertension - Patient apparently not taking Cardizem or atenolol - Have resumed home medications -See above regarding gross noncompliance -Stable and controlled currently    Permanent atrial fibrillation ([place]) - Per outpatient records, patient noncompliant to anticoagulants including NOAC -Have resumed home meds -Of note, patient claims to be taking only atenolol as it is the only medication she found in the house -Rate controlled     AKI (acute kidney injury) ([place]) - Likely prerenal, dehydration -  Continue IV fluids - renal function improving with IVF -repeat bmet in AM    Diabetes mellitus with diabetic neuropathy (HCC)  - Hold glipizide and metformin and use sliding scale insulin for now - Outpatient records  reviewed. See above. Has been grossly noncompliant with diabetes regimen - Glucose stable thus far  Memory loss - On exam, patient appears quite confused and forgetful - Per outpt records, pt has hx of on-going memory loss and has refused medications for dementia - Given overall picture, suspect worsening dementia - PT recs for no PT f/u. - Plan for eventual d/c home with [facility] to assist with coumadin monitoring  DVT prophylaxis: SCD's Code Status: Full Family Communication: Pt  In room, family at bedside Disposition Plan: Uncertain at this time  Consultants:     Procedures:     Antimicrobials: Anti-infectives    Start     Dose/Rate Route Frequency Ordered Stop   06/15/16 1800  cefTRIAXone (ROCEPHIN) 1 g in dextrose 5 % 50 mL IVPB     1 g 100 mL/hr over 30 Minutes Intravenous Every 24 hours 06/15/16 1617        Subjective: No complaints at this time  Objective: Vitals:   06/16/16 0425 06/16/16 2100 06/17/16 0504 06/17/16 1451  BP: (!) 145/84 (!) 141/81 (!) 147/85 115/64  Pulse: 97 89 88 66  Resp: 20 18 18 18   Temp: 97.8 F (36.6 C) 98.6 F (37 C) 98.2 F (36.8 C) 98.3 F (36.8 C)  TempSrc: Oral Oral Oral Oral  SpO2: 97% 94% 95% 95%  Weight: 50.4 kg (111 lb 1.6 oz)  51.6 kg (113 lb 12.1 oz)   Height:        Intake/Output Summary (Last 24 hours) at 06/17/16 1723 Last data filed at 06/17/16 1342  Gross per 24 hour  Intake          2648.83 ml  Output              175 ml  Net          2473.83 ml   Filed Weights   06/15/16 1512 06/16/16 0425 06/17/16 0504  Weight: 50.4 kg (111 lb 1.8 oz) 50.4 kg (111 lb 1.6 oz) 51.6 kg (113 lb 12.1 oz)    Examination: General exam: Awake, laying in bed, in nad Respiratory system: Normal respiratory effort, no wheezing Cardiovascular system: regular rate, s1, s2 Gastrointestinal system: Soft, nondistended, positive BS Central nervous system: CN2-12 grossly intact, strength intact Extremities: Perfused, no clubbing Skin:  Normal skin turgor, no notable skin lesions seen Psychiatry: Mood normal // no visual hallucinations   Data Reviewed: I have personally reviewed following labs and imaging studies  CBC:  Recent Labs Lab 06/15/16 1003 06/16/16 0808 06/17/16 0243  WBC 6.1 8.2 11.1*  HGB 11.2* 12.3 11.8*  HCT 32.4* 35.4* 33.4*  MCV 95.0 91.9 93.0  PLT 184 198 476   Basic Metabolic Panel:  Recent Labs Lab 06/15/16 1003 06/16/16 0808  NA 138 135  K 3.9 3.5  CL 99* 97*  CO2 28 28  GLUCOSE 136* 139*  BUN 15 11  CREATININE 1.01* 1.07*  CALCIUM 9.5 9.2   GFR: Estimated Creatinine Clearance: 32.5 mL/min (A) (by C-G formula based on SCr of 1.07 mg/dL (H)). Liver Function Tests:  Recent Labs Lab 06/15/16 1003  AST 25  ALT 28  ALKPHOS 53  BILITOT 1.5*  PROT 6.8  ALBUMIN 3.9    Recent Labs Lab 06/15/16 1003  LIPASE 34  No results for input(s): AMMONIA in the last 168 hours. Coagulation Profile:  Recent Labs Lab 06/16/16 1748  INR 1.09   Cardiac Enzymes: No results for input(s): CKTOTAL, CKMB, CKMBINDEX, TROPONINI in the last 168 hours. BNP (last 3 results) No results for input(s): PROBNP in the last 8760 hours. HbA1C:  Recent Labs  06/16/16 0808  HGBA1C 6.2*   CBG:  Recent Labs Lab 06/16/16 1629 06/16/16 2105 06/17/16 0748 06/17/16 1159 06/17/16 1643  GLUCAP 140* 168* 152* 133* 245*   Lipid Profile: No results for input(s): CHOL, HDL, LDLCALC, TRIG, CHOLHDL, LDLDIRECT in the last 72 hours. Thyroid Function Tests: No results for input(s): TSH, T4TOTAL, FREET4, T3FREE, THYROIDAB in the last 72 hours. Anemia Panel: No results for input(s): VITAMINB12, FOLATE, FERRITIN, TIBC, IRON, RETICCTPCT in the last 72 hours. Sepsis Labs: No results for input(s): PROCALCITON, LATICACIDVEN in the last 168 hours.  Recent Results (from the past 240 hour(s))  Urine culture     Status: Abnormal   Collection Time: 06/15/16  4:23 PM  Result Value Ref Range Status   Specimen  Description URINE, RANDOM  Final   Special Requests NONE  Final   Culture MULTIPLE SPECIES PRESENT, SUGGEST RECOLLECTION (A)  Final   Report Status 06/17/2016 FINAL  Final     Radiology Studies: No results found.  Scheduled Meds: . atenolol  50 mg Oral Daily  . atorvastatin  40 mg Oral q1800  . [START ON 06/18/2016] coumadin book   Does not apply Once  . diltiazem  120 mg Oral Daily  . feeding supplement (ENSURE ENLIVE)  237 mL Oral BID BM  . insulin aspart  0-9 Units Subcutaneous TID WC  . multivitamin with minerals  1 tablet Oral Daily  . pantoprazole  40 mg Oral Daily  . sodium chloride flush  3 mL Intravenous Q12H  . vitamin B-12  1,000 mcg Oral q morning - 10a  . warfarin  4 mg Oral ONCE-1800  . [START ON 06/18/2016] warfarin   Does not apply Once  . Warfarin - Pharmacist Dosing Inpatient   Does not apply q1800   Continuous Infusions: . sodium chloride 75 mL/hr at 06/17/16 1221  . cefTRIAXone (ROCEPHIN)  IV 1 g (06/17/16 1712)  . heparin 800 Units/hr (06/16/16 1847)     LOS: 2 days   CHIU, Orpah Melter, MD [facility] Pager [phone]  If 7PM-7AM, please contact night-coverage [email or web address] Password [facility] 06/17/2016, 5:23 PM

## 2016-06-17 NOTE — Progress Notes (Signed)
ANTICOAGULATION CONSULT NOTE - Follow Up Consult  Pharmacy Consult for Heparin Indication: Bilateral Renal Embolism  Allergies  Allergen Reactions  . Peanut-Containing Drug Products Anaphylaxis and Swelling  . Azithromycin Other (See Comments)    REACTION: pt states INTOL to ZPak  . Flomax [Tamsulosin Hcl] Swelling  . Pioglitazone Other (See Comments)    edema  . Pregabalin Other (See Comments)    REACTION: pt states INTOL to Lyrica  . Sulfonamide Derivatives Swelling    Patient Measurements: Height: 5\' 5"  (165.1 cm) Weight: 113 lb 12.1 oz (51.6 kg) IBW/kg (Calculated) : 57 Heparin Dosing Weight:   Vital Signs: Temp: 98.2 F (36.8 C) (06/08 0504) Temp Source: Oral (06/08 0504) BP: 147/85 (06/08 0504) Pulse Rate: 88 (06/08 0504)  Labs:  Recent Labs  06/15/16 1003 06/16/16 0808 06/16/16 1748 06/17/16 0243  HGB 11.2* 12.3  --  11.8*  HCT 32.4* 35.4*  --  33.4*  PLT 184 198  --  189  APTT  --   --  28  --   LABPROT  --   --  14.1  --   INR  --   --  1.09  --   HEPARINUNFRC  --   --   --  0.33  CREATININE 1.01* 1.07*  --   --     Estimated Creatinine Clearance: 32.5 mL/min (A) (by C-G formula based on SCr of 1.07 mg/dL (H)).   Medications:  Infusions:  . sodium chloride 75 mL/hr at 06/16/16 1705  . cefTRIAXone (ROCEPHIN)  IV Stopped (06/16/16 1848)  . heparin 800 Units/hr (06/16/16 1847)    Assessment: Patient with heparin level at goal.  No heparin issues noted.  Goal of Therapy:  Heparin level 0.3-0.7 units/ml Monitor platelets by anticoagulation protocol: Yes   Plan:  Continue heparin drip at current rate Recheck level at [place], [place] 06/17/2016,5:24 AM

## 2016-06-17 NOTE — Care Management Note (Signed)
Case Management Note  Patient Details  Name: Michelle Herrera MRN: [id number] Date of Birth: 05/08/1930  Subjective/Objective: Benefit check for xarelto/eliquis-co pay $47, must meet deductible of $350-currently @ $186-Spoke to dtr in law Michelle Herrera c#[phone] who is spouse of Michelle Herrera son-they cannot afford the co py therefore they want coumadin, she chose [facility] for HHRN-pt/inr checks;med instruction. Await orders.                   Action/Plan:d/c home w/HHC.   Expected Discharge Date:   (unknown)               Expected Discharge Plan:  [place]  In-House Referral:  Clinical Social Work  Discharge planning Services  CM Consult  Post Acute Care Choice:    Choice offered to:  Adult Children  DME Arranged:    DME Agency:     HH Arranged:  RN [place] Agency:  [place]  Status of Service:  In process, will continue to follow  If discussed at Long Length of Stay Meetings, dates discussed:    Additional Comments:  Michelle Phi, RN 06/17/2016, 2:01 PM

## 2016-06-17 NOTE — Progress Notes (Signed)
[place] for Heparin IV Indication: bilateral renal embolism  Allergies  Allergen Reactions  . Peanut-Containing Drug Products Anaphylaxis and Swelling  . Azithromycin Other (See Comments)    REACTION: pt states INTOL to ZPak  . Flomax [Tamsulosin Hcl] Swelling  . Pioglitazone Other (See Comments)    edema  . Pregabalin Other (See Comments)    REACTION: pt states INTOL to Lyrica  . Sulfonamide Derivatives Swelling    Patient Measurements: Height: 5\' 5"  (165.1 cm) Weight: 113 lb 12.1 oz (51.6 kg) IBW/kg (Calculated) : 57 Heparin Dosing Weight: TBW  Vital Signs: Temp: 98.2 F (36.8 C) (06/08 0504) Temp Source: Oral (06/08 0504) BP: 147/85 (06/08 0504) Pulse Rate: 88 (06/08 0504)  Labs:  Recent Labs  06/15/16 1003 06/16/16 0808 06/16/16 1748 06/17/16 0243  HGB 11.2* 12.3  --  11.8*  HCT 32.4* 35.4*  --  33.4*  PLT 184 198  --  189  APTT  --   --  28  --   LABPROT  --   --  14.1  --   INR  --   --  1.09  --   HEPARINUNFRC  --   --   --  0.33  CREATININE 1.01* 1.07*  --   --     Estimated Creatinine Clearance: 32.5 mL/min (A) (by C-G formula based on SCr of 1.07 mg/dL (H)).    Medications:  . sodium chloride 75 mL/hr at 06/16/16 1705  . cefTRIAXone (ROCEPHIN)  IV Stopped (06/16/16 1848)  . heparin 800 Units/hr (06/16/16 1847)    Assessment: 77 yoF admitted on 6/6 with lower abdominal pain.  CT abdomen was suspicious for embolic disease in the kidneys, right kidney specifically versus possible pyelonephritis.  PMH includes HTN, DM, AFib no longer on Xarelto, previously known bilateral renal emboli. Reported black stools prior to admission, but FOBT negative. She tolerated low dose ASA without bleeding, and pharmacy is now consulted to dose Heparin IV.     Baseline INR, aPTT: WNL  Prior anticoagulation: none, previously on Xarelto  Significant events:  Today, 06/17/2016:  Heparin level 0.42, remains  therapeutic  CBC: Hgb slightly decreased to 11.8, Plt WNL  No bleeding or infusion issues per nursing  SCr 1.07, CrCl ~32 ml/min, low mostly d/t age and low weight  Goal of Therapy: Heparin level 0.3-0.7 units/ml Monitor platelets by anticoagulation protocol: Yes  Plan:  Continue Heparin 800 units/hr IV infusion  Check heparin level in 8 hrs  Daily CBC, daily heparin level once stable  Monitor for signs of bleeding or thrombosis  Follow up long-term anticoagulation plan.  Case mgr will evaluate pricing of both Xarelto and Eliquis through insurance plan; warfarin may be an option if unable to afford DOAC.  Gretta Arab PharmD, BCPS Pager [phone] 06/17/2016 12:30 PM

## 2016-06-18 LAB — CBC
HCT: 30.6 % — ABNORMAL LOW (ref 36.0–46.0)
HEMOGLOBIN: 10.7 g/dL — AB (ref 12.0–15.0)
MCH: 31.9 pg (ref 26.0–34.0)
MCHC: 35 g/dL (ref 30.0–36.0)
MCV: 91.3 fL (ref 78.0–100.0)
PLATELETS: 204 10*3/uL (ref 150–400)
RBC: 3.35 MIL/uL — AB (ref 3.87–5.11)
RDW: 13.4 % (ref 11.5–15.5)
WBC: 9.4 10*3/uL (ref 4.0–10.5)

## 2016-06-18 LAB — GLUCOSE, CAPILLARY
GLUCOSE-CAPILLARY: 140 mg/dL — AB (ref 65–99)
GLUCOSE-CAPILLARY: 266 mg/dL — AB (ref 65–99)
Glucose-Capillary: 134 mg/dL — ABNORMAL HIGH (ref 65–99)
Glucose-Capillary: 70 mg/dL (ref 65–99)

## 2016-06-18 LAB — PROTIME-INR
INR: 1.24
PROTHROMBIN TIME: 15.6 s — AB (ref 11.4–15.2)

## 2016-06-18 LAB — HEPARIN LEVEL (UNFRACTIONATED)
HEPARIN UNFRACTIONATED: 0.22 [IU]/mL — AB (ref 0.30–0.70)
Heparin Unfractionated: 0.42 IU/mL (ref 0.30–0.70)

## 2016-06-18 MED ORDER — WARFARIN SODIUM 4 MG PO TABS
4.0000 mg | ORAL_TABLET | Freq: Once | ORAL | Status: AC
  Administered 2016-06-18: 4 mg via ORAL
  Filled 2016-06-18: qty 1

## 2016-06-18 MED ORDER — HEPARIN BOLUS VIA INFUSION
500.0000 [IU] | Freq: Once | INTRAVENOUS | Status: AC
  Administered 2016-06-18: 500 [IU] via INTRAVENOUS
  Filled 2016-06-18: qty 500

## 2016-06-18 NOTE — Progress Notes (Signed)
Brief Pharmacy Anticoagulation note: For full details see note from earlier today  Assessment and plan: Heparin level therapeutic 0.42 (goal 0.3-0.7) No bleeding or other issues per RN Continue heparin drip at 900 units/hr Check heparin level with am labs  Dolly Rias RPh 06/18/2016, 5:51 PM Pager [phone]

## 2016-06-18 NOTE — Progress Notes (Signed)
[place] for Heparin IV + warfarin Indication: bilateral renal embolism  Allergies  Allergen Reactions  . Peanut-Containing Drug Products Anaphylaxis and Swelling  . Azithromycin Other (See Comments)    REACTION: pt states INTOL to ZPak  . Flomax [Tamsulosin Hcl] Swelling  . Pioglitazone Other (See Comments)    edema  . Pregabalin Other (See Comments)    REACTION: pt states INTOL to Lyrica  . Sulfonamide Derivatives Swelling    Patient Measurements: Height: 5\' 5"  (165.1 cm) Weight: 115 lb 8.3 oz (52.4 kg) IBW/kg (Calculated) : 57 Heparin Dosing Weight: TBW  Vital Signs: Temp: 98.7 F (37.1 C) (06/09 0446) Temp Source: Oral (06/09 0446) BP: 129/68 (06/09 0446) Pulse Rate: 94 (06/09 0446)  Labs:  Recent Labs  06/15/16 1003 06/16/16 0808 06/16/16 1748 06/17/16 0243 06/17/16 1059 06/18/16 0646  HGB 11.2* 12.3  --  11.8*  --  10.7*  HCT 32.4* 35.4*  --  33.4*  --  30.6*  PLT 184 198  --  189  --  204  APTT  --   --  28  --   --   --   LABPROT  --   --  14.1  --   --  15.6*  INR  --   --  1.09  --   --  1.24  HEPARINUNFRC  --   --   --  0.33 0.42 0.22*  CREATININE 1.01* 1.07*  --   --   --   --     Estimated Creatinine Clearance: 33 mL/min (A) (by C-G formula based on SCr of 1.07 mg/dL (H)).    Medications:  . sodium chloride 75 mL/hr at 06/17/16 2348  . cefTRIAXone (ROCEPHIN)  IV Stopped (06/17/16 1742)  . heparin 800 Units/hr (06/17/16 1946)    Assessment: 48 yoF admitted on 6/6 with lower abdominal pain.  CT abdomen was suspicious for embolic disease in the kidneys, right kidney specifically versus possible pyelonephritis.  PMH includes HTN, DM, AFib no longer on Xarelto, previously known bilateral renal emboli. Reported black stools prior to admission, but FOBT negative. She tolerated low dose ASA without bleeding, and pharmacy is now consulted to dose Heparin IV.     Baseline INR, aPTT: WNL  Prior anticoagulation:  none, previously on Xarelto  Significant events: 6/8: patient and family agree to start warfarin to manage renal emboli   Today, 06/18/2016:  Heparin level 0.22, subtherapeutic  INR 1.24 subtherapeutic as expected after only 1 dose of warfarin  CBC: Hgb slightly decreased to 10.7, Plt WNL  No bleeding or infusion issues per nursing  Drug-drug interactions: none major, on Rocephin  SCr 1.07, CrCl ~32 ml/min, low mostly d/t age and low weight  Pt eating very little per RN  Goal of Therapy: Heparin level 0.3-0.7 units/ml INR 2-3 Monitor platelets by anticoagulation protocol: Yes   Plan:  Bolus heparin 500 units x 1 then increase heparin to 900 units/hr  Warfarin 4 mg tonight x 1 (lower dose d/t age > 80 and low weight)  Heparin level in 8 hours  Daily INR, CBC and heparin level  Monitor for signs of bleeding or thrombosis  Suggested duration of heparin-warfarin overlap is 5 days AND until at least 2 consecutive therapeutic INRs achieved  Dolly Rias RPh 06/18/2016, 8:55 AM Pager [phone]

## 2016-06-18 NOTE — Progress Notes (Signed)
PROGRESS NOTE    Michelle Herrera  YKD:[id number] DOB: 06-22-1930 DOA: 06/15/2016 PCP: Noralee Space, MD    Brief Narrative:  83 y.o. female with medical history significant for diabetes mellitus, atrial fibrillation, was apparently on anticoagulation with xarelto but this was stopped because of epistaxis, further history off hypertension pyelonephritis. [facility] with worsening lower abdominal pain which patient reports it has been going on for last couple of weeks but she did not have anybody to take her to the hospital until today. Her pain was present at rest and with movement, 10 out of 10 in intensity at worst. Pain would radiate to the back bilaterally. Pain is sharp, constant and her appetite diminished because of the pain. She doesn't know if she lost any weight over that amount of time since the pain started. Patient reports low-grade fevers at home. No chills. She has some nausea but no vomiting. No blood in the urine. No chest pain, shortness of breath or palpitations. No loss of consciousness or falls.  ED Course: In ED, heart rate was 46, respiratory rate 25, blood pressure 148/112, oxygen saturation 92% on room air. Blood work was notable for hemoglobin of 11.2, creatinine 1.01. CT abdomen was suspicious for embolic disease in the kidneys, right kidney specifically versus possible pyelonephritis. Patient was started empirically on Rocephin while awaiting urine culture results.  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 Lenna Gilford reviewed. Patient has an extensive history of gross medical noncompliance, with concern  for memory loss. Per Dr. Lenna Gilford, "Malley continues to treat herself by taking the meds as she wants & not as prescribed; she has also tied my hands as she will not take any of the newed (more expensive) DM meds and wil not agree to insulin rx; finally I have tried to refer her to endocrine/ DM specialist but she has repeatedly refused to go" -Patient has hx or renal infarction known as far ast 1/18, thought to be secondary to subtherapeutic INR from noncompliance with coumadin. Pt was subsequently transitioned to NOAC, however has not been taking. At this visit, patient claims to have been told to stop ASA and NOAC for an unknown reason, and instead, take tylenol daily. -Patient claims to have black stools. Hgb stable. Hemoccult negative -Tolerated heparin gtt overnight -See CM note. Given cost constraints, family has elected to transition to coumadin. Family is aware of necessity for following up for coumadin level checks and will be more involved in patient's care than before -Would benefit from [facility] to assist with coumadin monitoring -continue with heparin/coumadin overlap x 5 days with 2 consecutive days of therapeutic INR -will recheck cbc in AM  Active Problems:   Essential hypertension - Patient apparently not taking Cardizem or atenolol - Have resumed home medications -See above regarding gross noncompliance -remains stable currently    Permanent atrial fibrillation ([place]) - Per outpatient records, patient noncompliant to anticoagulants including NOAC -Have resumed home meds -Of note, patient claims to be taking only atenolol as it is the only medication she found in the house -remains rate controlled    AKI (acute kidney injury) ([place]) - Likely prerenal, dehydration -  Continue IV fluids - renal function improving with IVF - will recheck bmet in AM    Diabetes mellitus with diabetic neuropathy (HCC)  - Hold glipizide and metformin and use sliding scale insulin for now -  Outpatient records reviewed. See above. Has been grossly noncompliant with diabetes regimen - currently stable  Memory loss - On exam, patient appears quite confused and forgetful - Per outpt records, pt has hx of on-going memory loss and has refused medications for dementia - Given overall picture, suspect worsening dementia - PT recs for no PT f/u. - will benefit from home with [facility] to assist with coumadin monitoring  DVT prophylaxis: SCD's Code Status: Full Family Communication: Pt  In room, family at bedside Disposition Plan: Uncertain at this time  Consultants:     Procedures:     Antimicrobials: Anti-infectives    Start     Dose/Rate Route Frequency Ordered Stop   06/15/16 1800  cefTRIAXone (ROCEPHIN) 1 g in dextrose 5 % 50 mL IVPB     1 g 100 mL/hr over 30 Minutes Intravenous Every 24 hours 06/15/16 1617        Subjective: Without complaints  Objective: Vitals:   06/17/16 2100 06/18/16 0446 06/18/16 0501 06/18/16 1421  BP: 123/70 129/68  (!) 120/99  Pulse: 83 94  (!) 47  Resp: 18 16  18   Temp: 100 F (37.8 C) 98.7 F (37.1 C)  98.2 F (36.8 C)  TempSrc: Oral Oral  Oral  SpO2: 98% 98%  97%  Weight:   52.4 kg (115 lb 8.3 oz)   Height:        Intake/Output Summary (Last 24 hours) at 06/18/16 1648 Last data filed at 06/18/16 1644  Gross per 24 hour  Intake          2395.95 ml  Output             1150 ml  Net          1245.95 ml   Filed Weights   06/16/16 0425 06/17/16 0504 06/18/16 0501  Weight: 50.4 kg (111 lb 1.6 oz) 51.6 kg (113 lb 12.1 oz) 52.4 kg (115 lb 8.3 oz)    Examination: General exam: Conversant, in no acute distress Respiratory system: normal chest rise, clear, no audible wheezing Cardiovascular system: regular rhythm, s1-s2 Gastrointestinal system: Nondistended, nontender, pos BS Central nervous system: No seizures, no tremors Extremities: No cyanosis, no joint deformities Skin: No rashes, no pallor Psychiatry: Affect normal //  no auditory hallucinations    Data Reviewed: I have personally reviewed following labs and imaging studies  CBC:  Recent Labs Lab 06/15/16 1003 06/16/16 0808 06/17/16 0243 06/18/16 0646  WBC 6.1 8.2 11.1* 9.4  HGB 11.2* 12.3 11.8* 10.7*  HCT 32.4* 35.4* 33.4* 30.6*  MCV 95.0 91.9 93.0 91.3  PLT 184 198 189 503   Basic Metabolic Panel:  Recent Labs Lab 06/15/16 1003 06/16/16 0808  NA 138 135  K 3.9 3.5  CL 99* 97*  CO2 28 28  GLUCOSE 136* 139*  BUN 15 11  CREATININE 1.01* 1.07*  CALCIUM 9.5 9.2   GFR: Estimated Creatinine Clearance: 33 mL/min (A) (by C-G formula based on SCr of 1.07 mg/dL (H)). Liver Function Tests:  Recent Labs Lab 06/15/16 1003  AST 25  ALT 28  ALKPHOS 53  BILITOT 1.5*  PROT 6.8  ALBUMIN 3.9    Recent Labs Lab 06/15/16 1003  LIPASE 34   No results for input(s): AMMONIA in the last  168 hours. Coagulation Profile:  Recent Labs Lab 06/16/16 1748 06/18/16 0646  INR 1.09 1.24   Cardiac Enzymes: No results for input(s): CKTOTAL, CKMB, CKMBINDEX, TROPONINI in the last 168 hours. BNP (last 3 results) No results for input(s): PROBNP in the last 8760 hours. HbA1C:  Recent Labs  06/16/16 0808  HGBA1C 6.2*   CBG:  Recent Labs Lab 06/17/16 1643 06/17/16 2114 06/18/16 0748 06/18/16 1154 06/18/16 1617  GLUCAP 245* 84 140* 134* 70   Lipid Profile: No results for input(s): CHOL, HDL, LDLCALC, TRIG, CHOLHDL, LDLDIRECT in the last 72 hours. Thyroid Function Tests: No results for input(s): TSH, T4TOTAL, FREET4, T3FREE, THYROIDAB in the last 72 hours. Anemia Panel: No results for input(s): VITAMINB12, FOLATE, FERRITIN, TIBC, IRON, RETICCTPCT in the last 72 hours. Sepsis Labs: No results for input(s): PROCALCITON, LATICACIDVEN in the last 168 hours.  Recent Results (from the past 240 hour(s))  Urine culture     Status: Abnormal   Collection Time: 06/15/16  4:23 PM  Result Value Ref Range Status   Specimen Description URINE,  RANDOM  Final   Special Requests NONE  Final   Culture MULTIPLE SPECIES PRESENT, SUGGEST RECOLLECTION (A)  Final   Report Status 06/17/2016 FINAL  Final     Radiology Studies: No results found.  Scheduled Meds: . atenolol  50 mg Oral Daily  . atorvastatin  40 mg Oral q1800  . diltiazem  120 mg Oral Daily  . feeding supplement (ENSURE ENLIVE)  237 mL Oral BID BM  . insulin aspart  0-9 Units Subcutaneous TID WC  . multivitamin with minerals  1 tablet Oral Daily  . pantoprazole  40 mg Oral Daily  . sodium chloride flush  3 mL Intravenous Q12H  . vitamin B-12  1,000 mcg Oral q morning - 10a  . warfarin  4 mg Oral ONCE-1800  . warfarin   Does not apply Once  . Warfarin - Pharmacist Dosing Inpatient   Does not apply q1800   Continuous Infusions: . sodium chloride 75 mL/hr at 06/18/16 1239  . cefTRIAXone (ROCEPHIN)  IV Stopped (06/17/16 1742)  . heparin 900 Units/hr (06/18/16 0857)     LOS: 3 days   CHIU, Orpah Melter, MD [facility] Pager [phone]  If 7PM-7AM, please contact night-coverage [email or web address] Password [facility] 06/18/2016, 4:48 PM

## 2016-06-19 LAB — CBC
HCT: 30.4 % — ABNORMAL LOW (ref 36.0–46.0)
Hemoglobin: 10.6 g/dL — ABNORMAL LOW (ref 12.0–15.0)
MCH: 31.9 pg (ref 26.0–34.0)
MCHC: 34.9 g/dL (ref 30.0–36.0)
MCV: 91.6 fL (ref 78.0–100.0)
PLATELETS: 225 10*3/uL (ref 150–400)
RBC: 3.32 MIL/uL — AB (ref 3.87–5.11)
RDW: 13.4 % (ref 11.5–15.5)
WBC: 8 10*3/uL (ref 4.0–10.5)

## 2016-06-19 LAB — GLUCOSE, CAPILLARY
GLUCOSE-CAPILLARY: 267 mg/dL — AB (ref 65–99)
Glucose-Capillary: 177 mg/dL — ABNORMAL HIGH (ref 65–99)
Glucose-Capillary: 167 mg/dL — ABNORMAL HIGH (ref 65–99)
Glucose-Capillary: 205 mg/dL — ABNORMAL HIGH (ref 65–99)

## 2016-06-19 LAB — PROTIME-INR
INR: 1.35
Prothrombin Time: 16.8 seconds — ABNORMAL HIGH (ref 11.4–15.2)

## 2016-06-19 LAB — HEPARIN LEVEL (UNFRACTIONATED): Heparin Unfractionated: 0.37 IU/mL (ref 0.30–0.70)

## 2016-06-19 MED ORDER — DILTIAZEM HCL ER COATED BEADS 120 MG PO CP24
120.0000 mg | ORAL_CAPSULE | Freq: Every day | ORAL | Status: DC
  Administered 2016-06-20 – 2016-06-22 (×3): 120 mg via ORAL
  Filled 2016-06-19 (×3): qty 1

## 2016-06-19 MED ORDER — SODIUM CHLORIDE 0.9 % IV BOLUS (SEPSIS): 250.0000 mL | Freq: Once | INTRAVENOUS | Status: DC

## 2016-06-19 MED ORDER — WARFARIN SODIUM 4 MG PO TABS
4.0000 mg | ORAL_TABLET | Freq: Once | ORAL | Status: AC
  Administered 2016-06-19: 4 mg via ORAL
  Filled 2016-06-19: qty 1

## 2016-06-19 NOTE — Progress Notes (Signed)
[place] for Heparin IV + warfarin Indication: bilateral renal embolism  Allergies  Allergen Reactions  . Peanut-Containing Drug Products Anaphylaxis and Swelling  . Azithromycin Other (See Comments)    REACTION: pt states INTOL to ZPak  . Flomax [Tamsulosin Hcl] Swelling  . Pioglitazone Other (See Comments)    edema  . Pregabalin Other (See Comments)    REACTION: pt states INTOL to Lyrica  . Sulfonamide Derivatives Swelling    Patient Measurements: Height: 5\' 5"  (165.1 cm) Weight: 120 lb 5.9 oz (54.6 kg) IBW/kg (Calculated) : 57 Heparin Dosing Weight: TBW  Vital Signs: Temp: 98.2 F (36.8 C) (06/10 0556) Temp Source: Oral (06/10 0556) BP: 144/75 (06/10 0556) Pulse Rate: 76 (06/10 0556)  Labs:  Recent Labs  06/16/16 1748  06/17/16 0243  06/18/16 0646 06/18/16 1706 06/19/16 0537  HGB  --   < > 11.8*  --  10.7*  --  10.6*  HCT  --   --  33.4*  --  30.6*  --  30.4*  PLT  --   --  189  --  204  --  225  APTT 28  --   --   --   --   --   --   LABPROT 14.1  --   --   --  15.6*  --  16.8*  INR 1.09  --   --   --  1.24  --  1.35  HEPARINUNFRC  --   --  0.33  < > 0.22* 0.42 0.37  < > = values in this interval not displayed.  Estimated Creatinine Clearance: 34.3 mL/min (A) (by C-G formula based on SCr of 1.07 mg/dL (H)).    Medications:  . sodium chloride 75 mL/hr at 06/19/16 0149  . cefTRIAXone (ROCEPHIN)  IV Stopped (06/18/16 1751)  . heparin 900 Units/hr (06/18/16 0857)    Assessment: 70 yoF admitted on 6/6 with lower abdominal pain.  CT abdomen was suspicious for embolic disease in the kidneys, right kidney specifically versus possible pyelonephritis.  PMH includes HTN, DM, AFib no longer on Xarelto, previously known bilateral renal emboli. Reported black stools prior to admission, but FOBT negative. She tolerated low dose ASA without bleeding, and pharmacy is now consulted to dose Heparin IV.     Baseline INR, aPTT:  WNL  Prior anticoagulation: none, previously on Xarelto  Significant events: 6/8: patient and family agree to start warfarin to manage renal emboli   Today, 06/19/2016:  Heparin level 0.37, therapeutic  INR 1.35 subtherapeutic  CBC: Hgb low but stable, Plt WNL  No bleeding or infusion issues per nursing  Drug-drug interactions: none major, on Rocephin  SCr 1.07, CrCl ~32 ml/min, low mostly d/t age and low weight  Pt eating very little per RN  Goal of Therapy: Heparin level 0.3-0.7 units/ml INR 2-3 Monitor platelets by anticoagulation protocol: Yes   Plan:  continue heparin at 900 units/hr  Warfarin 4 mg tonight x 1 (lower dose d/t age > 80 and low weight)  Daily INR, CBC and heparin level  Monitor for signs of bleeding or thrombosis  Suggested duration of heparin-warfarin overlap is 5 days AND until at least 2 consecutive therapeutic INRs achieved  Dolly Rias RPh 06/19/2016, 9:31 AM Pager [phone]

## 2016-06-19 NOTE — Progress Notes (Signed)
PROGRESS NOTE    Michelle Herrera  PFX:[id number] DOB: Apr 07, 1930 DOA: 06/15/2016 PCP: Michelle Space, MD    Brief Narrative:  83 y.o. female with medical history significant for diabetes mellitus, atrial fibrillation, was apparently on anticoagulation with xarelto but this was stopped because of epistaxis, further history off hypertension pyelonephritis. [facility] with worsening lower abdominal pain which patient reports it has been going on for last couple of weeks but she did not have anybody to take her to the hospital until today. Her pain was present at rest and with movement, 10 out of 10 in intensity at worst. Pain would radiate to the back bilaterally. Pain is sharp, constant and her appetite diminished because of the pain. She doesn't know if she lost any weight over that amount of time since the pain started. Patient reports low-grade fevers at home. No chills. She has some nausea but no vomiting. No blood in the urine. No chest pain, shortness of breath or palpitations. No loss of consciousness or falls.  ED Course: In ED, heart rate was 46, respiratory rate 25, blood pressure 148/112, oxygen saturation 92% on room air. Blood work was notable for hemoglobin of 11.2, creatinine 1.01. CT abdomen was suspicious for embolic disease in the kidneys, right kidney specifically versus possible pyelonephritis. Patient was started empirically on Rocephin while awaiting urine culture results.  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 Patient has an extensive history of gross medical noncompliance, with concern  for memory loss. Per Michelle Herrera, "Michelle Herrera continues to treat herself by taking the meds as she wants & not as prescribed; she has also tied my hands as she will not take any of the newed (more expensive) DM meds and wil not agree to insulin rx; finally I have tried to refer her to endocrine/ DM specialist but she has repeatedly refused to go" -Patient has hx or renal infarction known as far ast 1/18, thought to be secondary to subtherapeutic INR from noncompliance with coumadin. Pt was subsequently transitioned to NOAC, however has not been taking. At this visit, patient claims to have been told to stop ASA and NOAC for an unknown reason, and instead, take tylenol daily. -Patient claims to have black stools. Hgb stable. Hemoccult negative -Tolerated heparin gtt overnight -See CM note. Given cost constraints, family has elected to transition to coumadin. Family is aware of necessity for following up for coumadin level checks and will be more involved in patient's care than before -Would benefit from [facility] to assist with coumadin monitoring -continue with heparin/coumadin overlap x 5 days with 2 consecutive days of therapeutic INR -Labs reviewed. INR currently 1.35  Active Problems:   Essential hypertension - Patient apparently not taking Cardizem or atenolol - Have resumed home medications and tolerating -See above regarding gross noncompliance -Patient remains stable    Permanent atrial fibrillation ([place]) - Per outpatient records, patient noncompliant to anticoagulants including NOAC -Have resumed home meds -Of note, patient claims to be taking only atenolol as it is the only medication she found in the house -Heart rate remains controlled    AKI (acute kidney injury) ([place]) - Likely  prerenal, dehydration - Continue IV fluids - renal function improving with IVF - Plan to repeat basic metabolic panel    Diabetes mellitus with diabetic neuropathy (HCC)  - Hold glipizide and metformin and  use sliding scale insulin for now - Outpatient records reviewed. See above. Has been grossly noncompliant with diabetes regimen - At this time stable  Memory loss - On exam, patient appears quite confused and forgetful - Per outpt records, pt has hx of on-going memory loss and has refused medications for dementia - Given overall picture, suspect worsening dementia - PT recs for no PT f/u. - will benefit from home with [facility] to assist with coumadin monitoring -Appears to have mild confusion this morning  DVT prophylaxis: SCD's Code Status: Full Family Communication: Pt  In room, family at bedside Disposition Plan: Uncertain at this time  Consultants:     Procedures:     Antimicrobials: Anti-infectives    Start     Dose/Rate Route Frequency Ordered Stop   06/15/16 1800  cefTRIAXone (ROCEPHIN) 1 g in dextrose 5 % 50 mL IVPB     1 g 100 mL/hr over 30 Minutes Intravenous Every 24 hours 06/15/16 1617        Subjective: Eager to go home  Objective: Vitals:   06/18/16 1421 06/18/16 2032 06/19/16 0556 06/19/16 0558  BP: (!) 120/99 135/78 (!) 144/75   Pulse: (!) 47 75 76   Resp: 18 20 18    Temp: 98.2 F (36.8 C) 99.3 F (37.4 C) 98.2 F (36.8 C)   TempSrc: Oral Oral Oral   SpO2: 97% 100% 100%   Weight:    54.6 kg (120 lb 5.9 oz)  Height:        Intake/Output Summary (Last 24 hours) at 06/19/16 1503 Last data filed at 06/19/16 1231  Gross per 24 hour  Intake             1398 ml  Output              950 ml  Net              448 ml   Filed Weights   06/17/16 0504 06/18/16 0501 06/19/16 0558  Weight: 51.6 kg (113 lb 12.1 oz) 52.4 kg (115 lb 8.3 oz) 54.6 kg (120 lb 5.9 oz)    Examination: General exam: Awake, laying in bed, in nad Respiratory system: Normal respiratory effort, no wheezing Cardiovascular system: irregular rate, s1, s2 Gastrointestinal system: Soft, nondistended, positive BS Central nervous system: CN2-12 grossly intact, strength  intact Extremities: Perfused, no clubbing Skin: Normal skin turgor, no notable skin lesions seen Psychiatry: Mood normal // no visual hallucinations   Data Reviewed: I have personally reviewed following labs and imaging studies  CBC:  Recent Labs Lab 06/15/16 1003 06/16/16 0808 06/17/16 0243 06/18/16 0646 06/19/16 0537  WBC 6.1 8.2 11.1* 9.4 8.0  HGB 11.2* 12.3 11.8* 10.7* 10.6*  HCT 32.4* 35.4* 33.4* 30.6* 30.4*  MCV 95.0 91.9 93.0 91.3 91.6  PLT 184 198 189 204 324   Basic Metabolic Panel:  Recent Labs Lab 06/15/16 1003 06/16/16 0808  NA 138 135  K 3.9 3.5  CL 99* 97*  CO2 28 28  GLUCOSE 136* 139*  BUN 15 11  CREATININE 1.01* 1.07*  CALCIUM 9.5 9.2   GFR: Estimated Creatinine Clearance: 34.3 mL/min (A) (by C-G formula based on SCr of 1.07 mg/dL (H)). Liver Function Tests:  Recent Labs Lab 06/15/16 1003  AST 25  ALT 28  ALKPHOS 53  BILITOT 1.5*  PROT 6.8  ALBUMIN 3.9    Recent Labs Lab 06/15/16 1003  LIPASE 34   No results for input(s): AMMONIA in the last 168 hours. Coagulation Profile:  Recent Labs Lab 06/16/16 1748 06/18/16 0646 06/19/16 0537  INR 1.09 1.24 1.35   Cardiac Enzymes: No results for input(s): CKTOTAL, CKMB, CKMBINDEX, TROPONINI in the last 168 hours. BNP (last 3 results) No results for input(s): PROBNP in the last 8760 hours. HbA1C: No results for input(s): HGBA1C in the last 72 hours. CBG:  Recent Labs Lab 06/18/16 1154 06/18/16 1617 06/18/16 2023 06/19/16 0747 06/19/16 1130  GLUCAP 134* 70 266* 177* 267*   Lipid Profile: No results for input(s): CHOL, HDL, LDLCALC, TRIG, CHOLHDL, LDLDIRECT in the last 72 hours. Thyroid Function Tests: No results for input(s): TSH, T4TOTAL, FREET4, T3FREE, THYROIDAB in the last 72 hours. Anemia Panel: No results for input(s): VITAMINB12, FOLATE, FERRITIN, TIBC, IRON, RETICCTPCT in the last 72 hours. Sepsis Labs: No results for input(s): PROCALCITON, LATICACIDVEN in the last  168 hours.  Recent Results (from the past 240 hour(s))  Urine culture     Status: Abnormal   Collection Time: 06/15/16  4:23 PM  Result Value Ref Range Status   Specimen Description URINE, RANDOM  Final   Special Requests NONE  Final   Culture MULTIPLE SPECIES PRESENT, SUGGEST RECOLLECTION (A)  Final   Report Status 06/17/2016 FINAL  Final     Radiology Studies: No results found.  Scheduled Meds: . atenolol  50 mg Oral Daily  . atorvastatin  40 mg Oral q1800  . diltiazem  120 mg Oral Daily  . feeding supplement (ENSURE ENLIVE)  237 mL Oral BID BM  . insulin aspart  0-9 Units Subcutaneous TID WC  . multivitamin with minerals  1 tablet Oral Daily  . pantoprazole  40 mg Oral Daily  . sodium chloride flush  3 mL Intravenous Q12H  . vitamin B-12  1,000 mcg Oral q morning - 10a  . warfarin  4 mg Oral ONCE-1800  . Warfarin - Pharmacist Dosing Inpatient   Does not apply q1800   Continuous Infusions: . sodium chloride 75 mL/hr at 06/19/16 0149  . cefTRIAXone (ROCEPHIN)  IV Stopped (06/18/16 1751)  . heparin 900 Units/hr (06/18/16 0857)     LOS: 4 days   Bryson Gavia, Orpah Melter, MD [facility] Pager [phone]  If 7PM-7AM, please contact night-coverage [email or web address] Password [email or web address] 06/19/2016, 3:03 PM

## 2016-06-20 DIAGNOSIS — E860 Dehydration: Secondary | ICD-10-CM

## 2016-06-20 DIAGNOSIS — I482 Chronic atrial fibrillation: Secondary | ICD-10-CM

## 2016-06-20 DIAGNOSIS — R1030 Lower abdominal pain, unspecified: Secondary | ICD-10-CM

## 2016-06-20 DIAGNOSIS — E0940 Drug or chemical induced diabetes mellitus with neurological complications with diabetic neuropathy, unspecified: Secondary | ICD-10-CM

## 2016-06-20 DIAGNOSIS — E1142 Type 2 diabetes mellitus with diabetic polyneuropathy: Secondary | ICD-10-CM

## 2016-06-20 DIAGNOSIS — R109 Unspecified abdominal pain: Secondary | ICD-10-CM

## 2016-06-20 DIAGNOSIS — E1349 Other specified diabetes mellitus with other diabetic neurological complication: Secondary | ICD-10-CM

## 2016-06-20 DIAGNOSIS — N280 Ischemia and infarction of kidney: Principal | ICD-10-CM

## 2016-06-20 DIAGNOSIS — I4891 Unspecified atrial fibrillation: Secondary | ICD-10-CM

## 2016-06-20 LAB — CBC
HCT: 33.5 % — ABNORMAL LOW (ref 36.0–46.0)
Hemoglobin: 11.8 g/dL — ABNORMAL LOW (ref 12.0–15.0)
MCH: 32.3 pg (ref 26.0–34.0)
MCHC: 35.2 g/dL (ref 30.0–36.0)
MCV: 91.8 fL (ref 78.0–100.0)
Platelets: 259 10*3/uL (ref 150–400)
RBC: 3.65 MIL/uL — ABNORMAL LOW (ref 3.87–5.11)
RDW: 13.4 % (ref 11.5–15.5)
WBC: 7.2 10*3/uL (ref 4.0–10.5)

## 2016-06-20 LAB — BASIC METABOLIC PANEL
Anion gap: 9 (ref 5–15)
BUN: 17 mg/dL (ref 6–20)
CALCIUM: 8.7 mg/dL — AB (ref 8.9–10.3)
CO2: 26 mmol/L (ref 22–32)
CREATININE: 1.14 mg/dL — AB (ref 0.44–1.00)
Chloride: 100 mmol/L — ABNORMAL LOW (ref 101–111)
GFR calc Af Amer: 50 mL/min — ABNORMAL LOW (ref >60)
GFR calc non Af Amer: 43 mL/min — ABNORMAL LOW (ref >60)
GLUCOSE: 206 mg/dL — AB (ref 65–99)
Potassium: 3.6 mmol/L (ref 3.5–5.1)
Sodium: 135 mmol/L (ref 135–145)

## 2016-06-20 LAB — GLUCOSE, CAPILLARY
GLUCOSE-CAPILLARY: 215 mg/dL — AB (ref 65–99)
Glucose-Capillary: 186 mg/dL — ABNORMAL HIGH (ref 65–99)
Glucose-Capillary: 320 mg/dL — ABNORMAL HIGH (ref 65–99)
Glucose-Capillary: 267 mg/dL — ABNORMAL HIGH (ref 65–99)

## 2016-06-20 LAB — URINALYSIS, COMPLETE (UACMP) WITH MICROSCOPIC
BACTERIA UA: NONE SEEN
Bilirubin Urine: NEGATIVE
Glucose, UA: >=500 mg/dL — AB
Ketones, ur: 5 mg/dL — AB
Leukocytes, UA: NEGATIVE
Nitrite: NEGATIVE
PROTEIN: NEGATIVE mg/dL
Specific Gravity, Urine: 1.007 (ref 1.005–1.030)
Squamous Epithelial / LPF: NONE SEEN
pH: 6 (ref 5.0–8.0)

## 2016-06-20 LAB — PROTIME-INR
INR: 1.4
PROTHROMBIN TIME: 17.3 s — AB (ref 11.4–15.2)

## 2016-06-20 LAB — HEPARIN LEVEL (UNFRACTIONATED): HEPARIN UNFRACTIONATED: 0.53 [IU]/mL (ref 0.30–0.70)

## 2016-06-20 MED ORDER — HALOPERIDOL LACTATE 5 MG/ML IJ SOLN
2.0000 mg | Freq: Four times a day (QID) | INTRAMUSCULAR | Status: DC | PRN
  Administered 2016-06-20: 2 mg via INTRAVENOUS

## 2016-06-20 MED ORDER — HALOPERIDOL LACTATE 5 MG/ML IJ SOLN
INTRAMUSCULAR | Status: AC
  Filled 2016-06-20: qty 1

## 2016-06-20 MED ORDER — WARFARIN SODIUM 4 MG PO TABS: 4.0000 mg | ORAL_TABLET | Freq: Once | ORAL | Status: DC

## 2016-06-20 MED ORDER — WARFARIN SODIUM 4 MG PO TABS
4.0000 mg | ORAL_TABLET | Freq: Once | ORAL | Status: AC
  Administered 2016-06-20: 4 mg via ORAL
  Filled 2016-06-20: qty 1

## 2016-06-20 NOTE — Care Management Important Message (Signed)
Important Message  Patient Details  Name: SULLY MANZI MRN: [id number] Date of Birth: 1930/07/03   Medicare Important Message Given:  Yes    Kerin Salen 06/20/2016, 9:59 AMImportant Message  Patient Details  Name: LARA PALINKAS MRN: [id number] Date of Birth: 04/02/1930   Medicare Important Message Given:  Yes    Kerin Salen 06/20/2016, 9:59 AM

## 2016-06-20 NOTE — Progress Notes (Addendum)
[place] for Heparin IV + warfarin Indication: bilateral renal embolism, afib  Allergies  Allergen Reactions  . Peanut-Containing Drug Products Anaphylaxis and Swelling  . Azithromycin Other (See Comments)    REACTION: pt states INTOL to ZPak  . Flomax [Tamsulosin Hcl] Swelling  . Pioglitazone Other (See Comments)    edema  . Pregabalin Other (See Comments)    REACTION: pt states INTOL to Lyrica  . Sulfonamide Derivatives Swelling    Patient Measurements: Height: 5\' 5"  (165.1 cm) Weight: 122 lb 9.2 oz (55.6 kg) IBW/kg (Calculated) : 57 Heparin Dosing Weight: TBW  Vital Signs: Temp: 98.6 F (37 C) (06/11 0459) Temp Source: Oral (06/11 0459) BP: 145/71 (06/11 0459) Pulse Rate: 82 (06/11 0459)  Labs:  Recent Labs  06/18/16 0646 06/18/16 1706 06/19/16 0537 06/20/16 0548  HGB 10.7*  --  10.6* 11.8*  HCT 30.6*  --  30.4* 33.5*  PLT 204  --  225 259  LABPROT 15.6*  --  16.8* 17.3*  INR 1.24  --  1.35 1.40  HEPARINUNFRC 0.22* 0.42 0.37 0.53  CREATININE  --   --   --  1.14*    Estimated Creatinine Clearance: 32.8 mL/min (A) (by C-G formula based on SCr of 1.14 mg/dL (H)).  Assessment: 63 yoF admitted on 6/6 with lower abdominal pain.  CT abdomen was suspicious for embolic disease in the kidneys, right kidney specifically versus possible pyelonephritis.  PMH includes HTN, DM, AFib no longer on Xarelto, previously known bilateral renal emboli. Reported black stools prior to admission, but FOBT negative. She tolerated low dose ASA without bleeding, and pharmacy is now consulted to dose Heparin IV.     Baseline INR, aPTT: WNL  Prior anticoagulation: none, previously on Xarelto  Significant events: 6/8: patient and family agree to start warfarin to manage renal emboli   Today, 06/20/2016:  Heparin level 0.53, therapeutic  INR 1.4, slowly trending up after 3 doses of coumadin 4 mg  CBC: Hgb low but stable, Plt WNL  No bleeding  or infusion issues per nursing  Drug-drug interactions: none major, on Rocephin D#6  SCr 1.14, CrCl ~32 ml/min, low mostly d/t age and low weight  Pt eating very little per RN, no intake recorded in EPIC  Per CM: [facility] for [facility] and PT/INR checks  Goal of Therapy: Heparin level 0.3-0.7 units/ml INR 2-3 Monitor platelets by anticoagulation protocol: Yes   Plan:  continue heparin at 900 units/hr  Warfarin 4 mg tonight x 1 (lower dose d/t age > 43 and low weight)  Daily INR, CBC and heparin level  Monitor for signs of bleeding or thrombosis  Suggested duration of heparin-warfarin overlap is 5 days AND until at least 2 consecutive therapeutic INRs achieved  Could change to LMWH bridge therapy if needed for  [place], Pharm.D. [phone] 06/20/2016 8:27 AM

## 2016-06-20 NOTE — Progress Notes (Signed)
PROGRESS NOTE  Michelle Herrera TMH:[id number] DOB: 1930-11-09 DOA: 06/15/2016 PCP: Noralee Space, MD  Brief History:  83 y.o.femalewith medical history significant for diabetes mellitus, atrial fibrillation, was apparently on anticoagulation with xarelto but this was stopped because of epistaxis, coronary artery disease, hypertension presented with abdominal pain radiating to the back bilaterally. [facility] with worsening lower abdominal pain which patient reports it has been going on for last couple of weeks but she did not have anybody to take her to the hospital until 06/15/16. Her pain was present at rest and with movement, 10 out of 10 in intensity at worst. Pain would radiate to the back bilaterally. Pain is sharp, constant and her appetite diminished because of the pain. She doesn't know if she lost any weight over that amount of time since the pain started. Patient reports low-grade fevers at home. No chills. She has some nausea but no vomiting. No blood in the urine. No chest pain, shortness of breath or palpitations. No loss of consciousness or falls.  ED Course:In ED, heart rate was 46, respiratory rate 25, blood pressure 148/112, oxygen saturation 92% on room air. Blood work was notable for hemoglobin of 11.2, creatinine 1.01. CT abdomen was suspicious for embolic disease in the kidneys, right kidney specifically versus possible pyelonephritis. Patient was started empirically on Rocephin while awaiting urine culture results.  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 Lenna Gilford reviewed. Patient has an extensive history of gross medical noncompliance, with concern for memory loss. Per Dr. Lenna Gilford, "Michelle Herrera continues to treat herself by taking the meds as she wants &not as prescribed; she has also tied my hands as she will not take any of the newed  (more expensive) DM meds and wil not agree to insulin rx; finally I have tried to refer her to endocrine/ DM specialist but she has repeatedly refused to go" -Patient has hx or renal infarction dating back to Jan 2018 thought to be secondary to subtherapeutic INR from noncompliance with coumadin. Pt was subsequently transitioned to NOAC, however has not been taking. At this visit, patient claims to have been told to stop ASA and NOAC for an unknown reason, and instead, take tylenol daily. -Patient claims to have black stools. Hgb stable. Hemoccult negative -Tolerated heparin gtt overnight -See CM note. Given cost constraints, family has elected to transition to coumadin. Family is aware of necessity for following up for coumadin level checks and will be more involved in patient's care than before -Would benefit from [facility] to assist with coumadin monitoring -continue with heparin/coumadin overlap x D#4 of 5 days with 2 consecutive days of therapeutic INR   Active Problems: Essential hypertension - Patient apparently not taking Cardizem or atenolol at home - Have resumed home medications and tolerating -See above regarding gross noncompliance -Patient remains stable  Permanent atrial fibrillation ([place]) - Per outpatient records, patient noncompliant to anticoagulants including NOAC -Have resumed home meds -Of note, patient claims to be taking only atenolol as it is the only medication she found in the house -Heart rate remains controlled -continue coumadin with heparin bridge -CHADSVASc = 5  Dehydration - baseline creatinine 0.7-0.9 - Continue IV fluids - renal function improved with IVF - Plan to repeat basic metabolic panel  Diabetes mellitus with diabetic neuropathy (HCC) - Hold glipizide and metformin and use sliding scale insulin for  now - Outpatient records reviewed. See above. Has been grossly noncompliant with diabetes regimen - 06/16/16 A1c--6.2  Cognitive  impairment with behavior disturbance - On exam, patient appears quite confused and forgetful - Per outpt records, pt has hx of on-going memory loss and has refused medications for dementia - Given overall picture, suspect worsening dementia - PT recs for no PT f/u. - will benefit from home with [facility] to assist with coumadin monitoring -intermittently agitated during the hospitalization -haldol 2 mg IV q 6 hrs prn agitation  DVT prophylaxis: coumadin with heparin bridge Code Status: Full Family Communication: no family at bedside Disposition Plan:   Home when INR therapeutic   Antibiotics: Ceftriaxone 6/6>>>6/10    Subjective: Patient denies fevers, chills, headache, chest pain, dyspnea, nausea, vomiting, diarrhea, abdominal pain, dysuria, hematuria, hematochezia, and melena.   Objective: Vitals:   06/19/16 1500 06/19/16 2035 06/20/16 0459 06/20/16 1307  BP: 134/79 (!) 148/85 (!) 145/71 (!) 150/71  Pulse: 72 94 82 97  Resp: 17 16 18 18   Temp: 99.8 F (37.7 C) 98.5 F (36.9 C) 98.6 F (37 C) 98.3 F (36.8 C)  TempSrc: Oral Oral Oral Oral  SpO2: 98% 96% 98% 98%  Weight:   55.6 kg (122 lb 9.2 oz)   Height:        Intake/Output Summary (Last 24 hours) at 06/20/16 1538 Last data filed at 06/20/16 1500  Gross per 24 hour  Intake             2186 ml  Output             1900 ml  Net              286 ml   Weight change: 1 kg (2 lb 3.3 oz) Exam:   General:  Pt is alert, follows commands appropriately, not in acute distress  HEENT: No icterus, No thrush, No neck mass, [place]/AT  Cardiovascular: IRRR, S1/S2, no rubs, no gallops  Respiratory: CTA bilaterally, no wheezing, no crackles, no rhonchi  Abdomen: Soft/+BS, non tender, non distended, no guarding  Extremities: No edema, No lymphangitis, No petechiae, No rashes, no synovitis   Data Reviewed: I have personally reviewed following labs and imaging studies Basic Metabolic Panel:  Recent Labs Lab 06/15/16 1003  06/16/16 0808 06/20/16 0548  NA 138 135 135  K 3.9 3.5 3.6  CL 99* 97* 100*  CO2 28 28 26   GLUCOSE 136* 139* 206*  BUN 15 11 17   CREATININE 1.01* 1.07* 1.14*  CALCIUM 9.5 9.2 8.7*   Liver Function Tests:  Recent Labs Lab 06/15/16 1003  AST 25  ALT 28  ALKPHOS 53  BILITOT 1.5*  PROT 6.8  ALBUMIN 3.9    Recent Labs Lab 06/15/16 1003  LIPASE 34   No results for input(s): AMMONIA in the last 168 hours. Coagulation Profile:  Recent Labs Lab 06/16/16 1748 06/18/16 0646 06/19/16 0537 06/20/16 0548  INR 1.09 1.24 1.35 1.40   CBC:  Recent Labs Lab 06/16/16 0808 06/17/16 0243 06/18/16 0646 06/19/16 0537 06/20/16 0548  WBC 8.2 11.1* 9.4 8.0 7.2  HGB 12.3 11.8* 10.7* 10.6* 11.8*  HCT 35.4* 33.4* 30.6* 30.4* 33.5*  MCV 91.9 93.0 91.3 91.6 91.8  PLT 198 189 204 225 259   Cardiac Enzymes: No results for input(s): CKTOTAL, CKMB, CKMBINDEX, TROPONINI in the last 168 hours. BNP: Invalid input(s): POCBNP CBG:  Recent Labs Lab 06/19/16 1130 06/19/16 1628 06/19/16 2031 06/20/16 0754 06/20/16 1130  GLUCAP 267* 167* 205*  186* 320*   HbA1C: No results for input(s): HGBA1C in the last 72 hours. Urine analysis:    Component Value Date/Time   COLORURINE STRAW (A) 06/20/2016 0950   APPEARANCEUR CLEAR 06/20/2016 0950   LABSPEC 1.007 06/20/2016 0950   PHURINE 6.0 06/20/2016 0950   GLUCOSEU >=500 (A) 06/20/2016 0950   GLUCOSEU NEGATIVE 10/14/2008 1148   HGBUR SMALL (A) 06/20/2016 0950   BILIRUBINUR NEGATIVE 06/20/2016 0950   KETONESUR 5 (A) 06/20/2016 0950   PROTEINUR NEGATIVE 06/20/2016 0950   UROBILINOGEN 0.2 02/05/2011 1117   NITRITE NEGATIVE 06/20/2016 0950   LEUKOCYTESUR NEGATIVE 06/20/2016 0950   Sepsis Labs: @LABRCNTIP (procalcitonin:4,lacticidven:4) ) Recent Results (from the past 240 hour(s))  Urine culture     Status: Abnormal   Collection Time: 06/15/16  4:23 PM  Result Value Ref Range Status   Specimen Description URINE, RANDOM  Final    Special Requests NONE  Final   Culture MULTIPLE SPECIES PRESENT, SUGGEST RECOLLECTION (A)  Final   Report Status 06/17/2016 FINAL  Final     Scheduled Meds: . haloperidol lactate      . atenolol  50 mg Oral Daily  . atorvastatin  40 mg Oral q1800  . diltiazem  120 mg Oral Daily  . feeding supplement (ENSURE ENLIVE)  237 mL Oral BID BM  . insulin aspart  0-9 Units Subcutaneous TID WC  . multivitamin with minerals  1 tablet Oral Daily  . pantoprazole  40 mg Oral Daily  . sodium chloride flush  3 mL Intravenous Q12H  . vitamin B-12  1,000 mcg Oral q morning - 10a  . Warfarin - Pharmacist Dosing Inpatient   Does not apply q1800   Continuous Infusions: . sodium chloride 75 mL/hr at 06/20/16 0221  . heparin 900 Units/hr (06/18/16 0857)    Procedures/Studies: Ct Abdomen Pelvis W Contrast  Result Date: 06/15/2016 CLINICAL DATA:  Abdominal pain with tenderness and nausea. EXAM: CT ABDOMEN AND PELVIS WITH CONTRAST TECHNIQUE: Multidetector CT imaging of the abdomen and pelvis was performed using the standard protocol following bolus administration of intravenous contrast. CONTRAST:  142mL ISOVUE-300 IOPAMIDOL (ISOVUE-300) INJECTION 61% COMPARISON:  03/12/2016.  MRI abdomen 03/18/2016 FINDINGS: Lower chest: Heart is enlarged. Stable appearance lingular scarring. Hepatobiliary: Liver perfusion heterogeneous. Gallbladder surgically absent. No intrahepatic or extrahepatic biliary dilation. Pancreas: 10 mm cystic lesion in the head of the pancreas is similar to prior. Other tiny cystic foci in the region of the pancreatic head are better seen on previous MRI. No dilatation of the main pancreatic duct. Spleen: No splenomegaly. No focal mass lesion. Adrenals/Urinary Tract: No adrenal nodule or mass. Multiple areas of segmental non perfusion are identified in the right kidney. Imaging features are more suggestive of embolic disease than edema from pyelonephritis. Marked differential perfusion also identified  in the left kidney. No hydroureteronephrosis. The urinary bladder appears normal for the degree of distention. Stomach/Bowel: Stomach is nondistended. No gastric wall thickening. No evidence of outlet obstruction. Duodenum is normally positioned as is the ligament of Treitz. No small bowel wall thickening. No small bowel dilatation. The terminal ileum is normal. The appendix is not visualized, but there is no edema or inflammation in the region of the cecum. Diverticular changes are noted in the left colon without evidence of diverticulitis. Vascular/Lymphatic: There is abdominal aortic atherosclerosis without aneurysm. There is no gastrohepatic or hepatoduodenal ligament lymphadenopathy. No intraperitoneal or retroperitoneal lymphadenopathy. No pelvic sidewall lymphadenopathy. Reproductive: Calcified fibroids are identified in the uterus. Other: There is no adnexal mass.  Musculoskeletal: Bones are diffusely demineralized. IMPRESSION: 1. Interval development of segmental areas of non perfusion in the right kidney and segmental differential perfusion in the left kidney. Given the appearance of non perfusion in the right kidney, embolic disease to the kidneys is favored over segmental edema from pyelonephritis. 2. Stable appearance of dominant cystic lesion in the uncinate process of the pancreas. 3. Abdominal aortic atherosclerosis. 4. Left colonic diverticulosis without diverticulitis. Electronically Signed   By: Misty Stanley M.D.   On: 06/15/2016 11:55    Monee Dembeck, DO  [facility] Pager [phone]  If 7PM-7AM, please contact night-coverage [email or web address] Password [email or web address] 06/20/2016, 3:38 PM   LOS: 5 days

## 2016-06-20 NOTE — Progress Notes (Signed)
PT Cancellation Note  Patient Details Name: Michelle Herrera MRN: [id number] DOB: [birth year]   Cancelled Treatment:    Reason Eval/Treat Not Completed: Attempted PT tx session. Spoke with nursing who recommended PT be held on today due to pt having episodes of aggression.    Weston Anna, MPT Pager: [phone]

## 2016-06-20 NOTE — Progress Notes (Signed)
Inpatient Diabetes Program Recommendations  AACE/ADA: New Consensus Statement on Inpatient Glycemic Control (2015)  Target Ranges:  Prepandial:   less than 140 mg/dL      Peak postprandial:   less than 180 mg/dL (1-2 hours)      Critically ill patients:  140 - 180 mg/dL    Review of Glycemic Control  Diabetes history: DM 2 Outpatient Diabetes medications: Glipizide 20 mg BID, Metformin 1000 mg BID Current orders for Inpatient glycemic control: Novolog Sensitive 0-9 units tid  Inpatient Diabetes Program Recommendations:    Consider increase in Novolog Correction to Moderate 0-15 units tid.  Thanks,  Tama Headings RN, MSN, [facility] Inpatient Diabetes Coordinator Team Pager [phone] (8a-5p)

## 2016-06-20 NOTE — Progress Notes (Signed)
Pt became very agitated this morning, got out of bed and insisted she was leaving.  Pulled off heart monitor, refused to lay in the bed.  MD notified, order received for Haldol 2mg .  Given with good effect.  Pt now much calmer, following commands appropriately.  Will continue to monitor and continue with plan of care.

## 2016-06-21 DIAGNOSIS — N179 Acute kidney failure, unspecified: Secondary | ICD-10-CM

## 2016-06-21 DIAGNOSIS — E0840 Diabetes mellitus due to underlying condition with diabetic neuropathy, unspecified: Secondary | ICD-10-CM

## 2016-06-21 DIAGNOSIS — I10 Essential (primary) hypertension: Secondary | ICD-10-CM

## 2016-06-21 LAB — CBC
HCT: 31.4 % — ABNORMAL LOW (ref 36.0–46.0)
HEMOGLOBIN: 11.1 g/dL — AB (ref 12.0–15.0)
MCH: 31.5 pg (ref 26.0–34.0)
MCHC: 35.4 g/dL (ref 30.0–36.0)
MCV: 89.2 fL (ref 78.0–100.0)
Platelets: 277 10*3/uL (ref 150–400)
RBC: 3.52 MIL/uL — ABNORMAL LOW (ref 3.87–5.11)
RDW: 13.5 % (ref 11.5–15.5)
WBC: 7.6 10*3/uL (ref 4.0–10.5)

## 2016-06-21 LAB — HEPARIN LEVEL (UNFRACTIONATED): HEPARIN UNFRACTIONATED: 0.5 [IU]/mL (ref 0.30–0.70)

## 2016-06-21 LAB — GLUCOSE, CAPILLARY
GLUCOSE-CAPILLARY: 259 mg/dL — AB (ref 65–99)
GLUCOSE-CAPILLARY: 195 mg/dL — AB (ref 65–99)
Glucose-Capillary: 233 mg/dL — ABNORMAL HIGH (ref 65–99)
Glucose-Capillary: 223 mg/dL — ABNORMAL HIGH (ref 65–99)

## 2016-06-21 LAB — PROTIME-INR
INR: 1.99
PROTHROMBIN TIME: 22.9 s — AB (ref 11.4–15.2)

## 2016-06-21 LAB — URINE CULTURE: CULTURE: NO GROWTH

## 2016-06-21 MED ORDER — ENSURE ENLIVE PO LIQD: 237.0000 mL | Freq: Two times a day (BID) | ORAL | 12 refills | Status: AC

## 2016-06-21 MED ORDER — INSULIN ASPART 100 UNIT/ML ~~LOC~~ SOLN: 0.0000 [IU] | Freq: Every day | SUBCUTANEOUS | Status: DC

## 2016-06-21 MED ORDER — WARFARIN SODIUM 4 MG PO TABS
4.0000 mg | ORAL_TABLET | Freq: Once | ORAL | Status: AC
  Administered 2016-06-21: 4 mg via ORAL
  Filled 2016-06-21: qty 1

## 2016-06-21 MED ORDER — INSULIN ASPART 100 UNIT/ML ~~LOC~~ SOLN
0.0000 [IU] | Freq: Three times a day (TID) | SUBCUTANEOUS | Status: DC
  Administered 2016-06-21 – 2016-06-22 (×2): 3 [IU] via SUBCUTANEOUS

## 2016-06-21 MED ORDER — GLIPIZIDE 10 MG PO TABS: 10.0000 mg | ORAL_TABLET | Freq: Two times a day (BID) | ORAL | 0 refills | Status: DC

## 2016-06-21 NOTE — Plan of Care (Signed)
Problem: Health Behavior/Discharge Planning: Goal: Ability to manage health-related needs will improve Pt confused at times, not able to manage by herself.  Plan is to go home with son and have a home health RN to assist with needs.    Problem: Physical Regulation: Goal: Ability to maintain clinical measurements within normal limits will improve Outcome: Completed/Met Date Met: 06/21/16 . Goal: Will remain free from infection Outcome: Progressing Pt up with assist.  Continue with plan of care.   Problem: Tissue Perfusion: Goal: Risk factors for ineffective tissue perfusion will decrease Outcome: Progressing On heparin drip.  Today's INR is 1.99.  Continue with current plan of care.   Problem: Fluid Volume: Goal: Ability to maintain a balanced intake and output will improve Outcome: Progressing .  Problem: Bowel/Gastric: Goal: Will not experience complications related to bowel motility Outcome: Progressing BM yesterday.  Will continue to monitor.

## 2016-06-21 NOTE — Progress Notes (Signed)
[place] for Heparin IV + warfarin Indication: bilateral renal embolism, afib  Allergies  Allergen Reactions  . Peanut-Containing Drug Products Anaphylaxis and Swelling  . Azithromycin Other (See Comments)    REACTION: pt states INTOL to ZPak  . Flomax [Tamsulosin Hcl] Swelling  . Pioglitazone Other (See Comments)    edema  . Pregabalin Other (See Comments)    REACTION: pt states INTOL to Lyrica  . Sulfonamide Derivatives Swelling    Patient Measurements: Height: 5\' 5"  (165.1 cm) Weight: 123 lb 10.9 oz (56.1 kg) IBW/kg (Calculated) : 57 Heparin Dosing Weight: TBW  Vital Signs: Temp: 98.6 F (37 C) (06/12 0442) Temp Source: Oral (06/12 0442) BP: 164/91 (06/12 0442) Pulse Rate: 86 (06/12 0442)  Labs:  Recent Labs  06/19/16 0537 06/20/16 0548 06/21/16 0709  HGB 10.6* 11.8* 11.1*  HCT 30.4* 33.5* 31.4*  PLT 225 259 277  LABPROT 16.8* 17.3* 22.9*  INR 1.35 1.40 1.99  HEPARINUNFRC 0.37 0.53 0.50  CREATININE  --  1.14*  --     Estimated Creatinine Clearance: 33.1 mL/min (A) (by C-G formula based on SCr of 1.14 mg/dL (H)).  Assessment: 31 yoF admitted on 6/6 with lower abdominal pain.  CT abdomen was suspicious for embolic disease in the kidneys, right kidney specifically versus possible pyelonephritis.  PMH includes HTN, DM, AFib no longer on Xarelto, previously known bilateral renal emboli. Reported black stools prior to admission, but FOBT negative. She tolerated low dose ASA without bleeding, and pharmacy is now consulted to dose Heparin IV.     Baseline INR, aPTT: WNL  Prior anticoagulation: none, previously on Xarelto  Significant events: 6/8: patient and family agree to start warfarin to manage renal emboli   Today, 06/21/2016:  Heparin level 0.50, therapeutic  INR 1.99, at low end of goal after 4 doses of coumadin 4 mg  CBC: Hgb low but stable, Plt WNL  No bleeding or infusion issues per nursing  Drug-drug  interactions: none major, on Rocephin D#6  SCr 1.14, CrCl ~32 ml/min, low mostly d/t age and low weight  Pt eating very little per RN, no intake recorded in EPIC  Per CM: [facility] chosen for [facility] and PT/INR checks  Goal of Therapy: Heparin level 0.3-0.7 units/ml INR 2-3 Monitor platelets by anticoagulation protocol: Yes   Plan:  continue heparin at 900 units/hr  Warfarin 4 mg tonight x 1 dose again today  Daily INR, CBC and heparin level  Monitor for signs of bleeding or thrombosis  Suggested duration of heparin-warfarin overlap is 5 days AND until at least 2 consecutive therapeutic INRs achieved  Could change to LMWH bridge therapy if needed for  [place], Pharm.D. [phone] 06/21/2016 8:18 AM

## 2016-06-21 NOTE — Discharge Instructions (Addendum)

## 2016-06-21 NOTE — Progress Notes (Signed)
PT Cancellation Note  Patient Details Name: GLADYES KUDO MRN: [id number] DOB: 1930/09/13   Cancelled Treatment:     pt in a restful sleep after an eventful night.  Will check back another day as schedule permits.  Pt has been evaluated with rec to return home and no HHPT.    Rica Koyanagi  PTA [facility]  Acute  Rehab Pager      [phone]

## 2016-06-21 NOTE — Discharge Summary (Addendum)
Physician Discharge Summary  Michelle Herrera WUG:[id number] DOB: Oct 15, 1930 DOA: 06/15/2016  PCP: Noralee Space, MD  Admit date: 06/15/2016 Discharge date: 06/22/16  Admitted From: Home Disposition:  Home   Recommendations for Outpatient Follow-up:  1. Follow up with PCP in 1-2 weeks 2. Please obtain BMP/CBC in one week 3. Please check INR on 06/24/16 and then once every 7 days x 4 occurrence;  Adjust coumadin dose to maintain INR 2-3.  Pt had been receiving coumadin 4 mg daily x 5 days prior to discharge   Home Health: YES Equipment/Devices: HHRN to check INR  Discharge Condition: Stable CODE STATUS: FULL Diet recommendation: Heart Healthy / Carb Modified   Brief/Interim Summary: 83 y.o.femalewith medical history significant for diabetes mellitus, atrial fibrillation, was apparently on anticoagulation with xarelto but this was stopped because of epistaxis, coronary artery disease, hypertension presented with abdominal pain radiating to the back bilaterally. [facility] lower abdominal pain which patient reports it has been going on for last couple of weeks but she did not have anybody to take her to the hospital until 06/15/16. Her pain was present at rest and with movement, 10 out of 10 in intensity at worst. Pain would radiate to the back bilaterally. Pain is sharp, constant and her appetite diminished because of the pain. She doesn't know if she lost any weight over that amount of time since the pain started. Patient reports low-grade fevers at home. No chills. She has some nausea but no vomiting. No blood in the urine. No chest pain, shortness of breath or palpitations. No loss of consciousness or falls.  ED Course:In ED, heart rate was 46, respiratory rate 25, blood pressure 148/112, oxygen saturation 92% on room air. Blood work was notable for hemoglobin of 11.2, creatinine 1.01. CT abdomen was suspicious for embolic disease in the  kidneys, right kidney specifically versus possible pyelonephritis. Patient was started empirically on Rocephin while awaiting urine culture results.    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 Lenna Gilford reviewed. Patient has an extensive history of gross medical noncompliance, with concern for memory loss. Per Dr. Lenna Gilford, "Betania continues to treat herself by taking the meds as she wants &not as prescribed; she has also tied my hands as she will not take any of the newed (more expensive) DM meds and wil not agree to insulin rx; finally I have tried to refer her to endocrine/ DM specialist but she has repeatedly refused to go" -Patient has hx or renal infarction dating back to Jan 2018 thought to be secondary to subtherapeutic INR from noncompliance with coumadin. Pt was subsequently transitioned to NOAC, however has not been taking. At this visit, patient claims to have been told to stop ASA and NOAC for an unknown reason, and instead, take tylenol daily. -Patient claims to have black stools. Hgb stable. Hemoccult negative -Tolerated heparin gtt overnight -See CM note. Given cost constraints, family has elected to transition to coumadin. Family is aware of necessity for following up for coumadin level checks and will be more involved in patient's care than before -Would home with [facility] to assist with coumadin monitoring -continued with heparin/coumadin overlap x  5 days with 2 consecutive days of therapeutic INR   Active Problems: Essential hypertension - Patient apparently not taking Cardizem or atenolol at home - Have resumed home medicationsand tolerating -See above regarding gross noncompliance -Patient remains  stable -will not restart losartan due to risk of dehydration and AKI  Permanent atrial fibrillation ([place]) - Per outpatient records, patient noncompliant to  anticoagulants including NOAC -Have resumed home meds--atenolol and diltiazem CD -Of note, patient claims to be taking only atenolol as it is the only medication she found in the house -Heart rate remains controlled -continue coumadin with heparin bridge--had 5 days of bridge -CHADSVASc = 5  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 See above. Has been grossly noncompliant with diabetes regimen - 06/16/16 A1c--6.2 -home with glipizide 10 mg bid -will not restart metformin as CrCl~50  Cognitive impairment with behavior disturbance - On exam, patient appears quite confused and forgetful - Per outpt records, pt has hx of on-going memory loss and has refused medications for dementia - Given overall picture, suspect progression of dementia - PT recs for no PT f/u. - home with [facility] to assist with coumadin monitoring -intermittently agitated during the hospitalization -haldol 2 mg IV q 6 hrs prn agitation   Discharge Instructions   Allergies as of 06/21/2016      Reactions   Peanut-containing Drug Products Anaphylaxis, Swelling   Azithromycin Other (See Comments)   REACTION: pt states INTOL to ZPak   Flomax [tamsulosin Hcl] Swelling   Pioglitazone Other (See Comments)   edema   Pregabalin Other (See Comments)   REACTION: pt states INTOL to Lyrica   Sulfonamide Derivatives Swelling      Medication List    STOP taking these medications   losartan 25 MG tablet Commonly known as:  COZAAR   metFORMIN 500 MG tablet Commonly known as:  GLUCOPHAGE   oxybutynin 5 MG tablet Commonly known as:  DITROPAN   Rivaroxaban 15 MG Tabs tablet Commonly known as:  XARELTO   traMADol 50 MG tablet Commonly known as:  ULTRAM     TAKE these medications   ACCU-CHEK AVIVA PLUS  w/Device Kit Test blood sugar up to two times daily   acetaminophen 500 MG tablet Commonly known as:  TYLENOL Take 500 mg by mouth every 6 (six) hours as needed for mild pain.   atenolol 50 MG tablet Commonly known as:  TENORMIN TAKE ONE TABLET BY MOUTH ONCE DAILY   atorvastatin 40 MG tablet Commonly known as:  LIPITOR TAKE ONE TABLET BY MOUTH ONCE DAILY AT 6PM   diltiazem 120 MG 24 hr capsule Commonly known as:  CARTIA XT Take 1 capsule (120 mg total) by mouth daily.   famotidine 20 MG tablet Commonly known as:  PEPCID Take 1 tablet (20 mg total) by mouth at bedtime.   feeding supplement (ENSURE ENLIVE) Liqd Take 237 mLs by mouth 2 (two) times daily between meals. Start taking on:  06/22/2016   gabapentin 100 MG capsule Commonly known as:  NEURONTIN TAKE ONE CAPSULE BY MOUTH THREE TIMES DAILY What changed:  Another medication with the same name was removed. Continue taking this medication, and follow the directions you see here.   glipiZIDE 10 MG tablet Commonly known as:  GLUCOTROL Take 1 tablet (10 mg total) by mouth 2 (two) times daily before a meal. What changed:  See the new instructions.   glucose blood test strip Commonly known as:  ACCU-CHEK AVIVA PLUS Test blood sugar up to two times daily   LANCETS ULTRA FINE Misc Test blood sugar up  to two times daily   nitroGLYCERIN 0.4 MG SL tablet Commonly known as:  NITROSTAT Place 1 tablet (0.4 mg total) under the tongue every 5 (five) minutes as needed for chest pain (up to 3 doses).   omeprazole 20 MG capsule Commonly known as:  PRILOSEC Take 1 tablet by mouth 30 minutes before breakfast   vitamin B-12 1000 MCG tablet Commonly known as:  CYANOCOBALAMIN Take 1,000 mcg by mouth every morning.      Follow-up Information    [facility]-Home Follow up.   Why:  HH nursing-pt/inr checks-call results to pcp;med instruction. Contact information: [place]  [id number] [phone]          Allergies  Allergen Reactions  . Peanut-Containing Drug Products Anaphylaxis and Swelling  . Azithromycin Other (See Comments)    REACTION: pt states INTOL to ZPak  . Flomax [Tamsulosin Hcl] Swelling  . Pioglitazone Other (See Comments)    edema  . Pregabalin Other (See Comments)    REACTION: pt states INTOL to Lyrica  . Sulfonamide Derivatives Swelling    Consultations:  none   Procedures/Studies: Ct Abdomen Pelvis W Contrast  Result Date: 06/15/2016 CLINICAL DATA:  Abdominal pain with tenderness and nausea. EXAM: CT ABDOMEN AND PELVIS WITH CONTRAST TECHNIQUE: Multidetector CT imaging of the abdomen and pelvis was performed using the standard protocol following bolus administration of intravenous contrast. CONTRAST:  122m ISOVUE-300 IOPAMIDOL (ISOVUE-300) INJECTION 61% COMPARISON:  03/12/2016.  MRI abdomen 03/18/2016 FINDINGS: Lower chest: Heart is enlarged. Stable appearance lingular scarring. Hepatobiliary: Liver perfusion heterogeneous. Gallbladder surgically absent. No intrahepatic or extrahepatic biliary dilation. Pancreas: 10 mm cystic lesion in the head of the pancreas is similar to prior. Other tiny cystic foci in the region of the pancreatic head are better seen on previous MRI. No dilatation of the main pancreatic duct. Spleen: No splenomegaly. No focal mass lesion. Adrenals/Urinary Tract: No adrenal nodule or mass. Multiple areas of segmental non perfusion are identified in the right kidney. Imaging features are more suggestive of embolic disease than edema from pyelonephritis. Marked differential perfusion also identified in the left kidney. No hydroureteronephrosis. The urinary bladder appears normal for the degree of distention. Stomach/Bowel: Stomach is nondistended. No gastric wall thickening. No evidence of outlet obstruction. Duodenum is normally positioned as is the ligament of Treitz. No small bowel wall thickening. No small bowel  dilatation. The terminal ileum is normal. The appendix is not visualized, but there is no edema or inflammation in the region of the cecum. Diverticular changes are noted in the left colon without evidence of diverticulitis. Vascular/Lymphatic: There is abdominal aortic atherosclerosis without aneurysm. There is no gastrohepatic or hepatoduodenal ligament lymphadenopathy. No intraperitoneal or retroperitoneal lymphadenopathy. No pelvic sidewall lymphadenopathy. Reproductive: Calcified fibroids are identified in the uterus. Other: There is no adnexal mass. Musculoskeletal: Bones are diffusely demineralized. IMPRESSION: 1. Interval development of segmental areas of non perfusion in the right kidney and segmental differential perfusion in the left kidney. Given the appearance of non perfusion in the right kidney, embolic disease to the kidneys is favored over segmental edema from pyelonephritis. 2. Stable appearance of dominant cystic lesion in the uncinate process of the pancreas. 3. Abdominal aortic atherosclerosis. 4. Left colonic diverticulosis without diverticulitis. Electronically Signed   By: EMisty StanleyM.D.   On: 06/15/2016 11:55        Discharge Exam: Vitals:   06/21/16 0442 06/21/16 1522  BP: (!) 164/91 (!) 154/63  Pulse: 86 80  Resp:  19 19  Temp: 98.6 F (37 C) 98.2 F (36.8 C)   Vitals:   06/20/16 1307 06/20/16 2028 06/21/16 0442 06/21/16 1522  BP: (!) 150/71 114/64 (!) 164/91 (!) 154/63  Pulse: 97 89 86 80  Resp: 18 18 19 19   Temp: 98.3 F (36.8 C) 98 F (36.7 C) 98.6 F (37 C) 98.2 F (36.8 C)  TempSrc: Oral Oral Oral Oral  SpO2: 98% 98% 100% 96%  Weight:   56.1 kg (123 lb 10.9 oz)   Height:        General: Pt is alert, awake, not in acute distress Cardiovascular: RRR, S1/S2 +, no rubs, no gallops Respiratory: CTA bilaterally, no wheezing, no rhonchi Abdominal: Soft, NT, ND, bowel sounds + Extremities: no edema, no cyanosis   The results of significant  diagnostics from this hospitalization (including imaging, microbiology, ancillary and laboratory) are listed below for reference.    Significant Diagnostic Studies: Ct Abdomen Pelvis W Contrast  Result Date: 06/15/2016 CLINICAL DATA:  Abdominal pain with tenderness and nausea. EXAM: CT ABDOMEN AND PELVIS WITH CONTRAST TECHNIQUE: Multidetector CT imaging of the abdomen and pelvis was performed using the standard protocol following bolus administration of intravenous contrast. CONTRAST:  133m ISOVUE-300 IOPAMIDOL (ISOVUE-300) INJECTION 61% COMPARISON:  03/12/2016.  MRI abdomen 03/18/2016 FINDINGS: Lower chest: Heart is enlarged. Stable appearance lingular scarring. Hepatobiliary: Liver perfusion heterogeneous. Gallbladder surgically absent. No intrahepatic or extrahepatic biliary dilation. Pancreas: 10 mm cystic lesion in the head of the pancreas is similar to prior. Other tiny cystic foci in the region of the pancreatic head are better seen on previous MRI. No dilatation of the main pancreatic duct. Spleen: No splenomegaly. No focal mass lesion. Adrenals/Urinary Tract: No adrenal nodule or mass. Multiple areas of segmental non perfusion are identified in the right kidney. Imaging features are more suggestive of embolic disease than edema from pyelonephritis. Marked differential perfusion also identified in the left kidney. No hydroureteronephrosis. The urinary bladder appears normal for the degree of distention. Stomach/Bowel: Stomach is nondistended. No gastric wall thickening. No evidence of outlet obstruction. Duodenum is normally positioned as is the ligament of Treitz. No small bowel wall thickening. No small bowel dilatation. The terminal ileum is normal. The appendix is not visualized, but there is no edema or inflammation in the region of the cecum. Diverticular changes are noted in the left colon without evidence of diverticulitis. Vascular/Lymphatic: There is abdominal aortic atherosclerosis without  aneurysm. There is no gastrohepatic or hepatoduodenal ligament lymphadenopathy. No intraperitoneal or retroperitoneal lymphadenopathy. No pelvic sidewall lymphadenopathy. Reproductive: Calcified fibroids are identified in the uterus. Other: There is no adnexal mass. Musculoskeletal: Bones are diffusely demineralized. IMPRESSION: 1. Interval development of segmental areas of non perfusion in the right kidney and segmental differential perfusion in the left kidney. Given the appearance of non perfusion in the right kidney, embolic disease to the kidneys is favored over segmental edema from pyelonephritis. 2. Stable appearance of dominant cystic lesion in the uncinate process of the pancreas. 3. Abdominal aortic atherosclerosis. 4. Left colonic diverticulosis without diverticulitis. Electronically Signed   By: EMisty StanleyM.D.   On: 06/15/2016 11:55     Microbiology: Recent Results (from the past 240 hour(s))  Urine culture     Status: Abnormal   Collection Time: 06/15/16  4:23 PM  Result Value Ref Range Status   Specimen Description URINE, RANDOM  Final   Special Requests NONE  Final   Culture MULTIPLE SPECIES PRESENT, SUGGEST RECOLLECTION (A)  Final   Report  Status 06/17/2016 FINAL  Final  Urine Culture     Status: None   Collection Time: 06/20/16  9:50 AM  Result Value Ref Range Status   Specimen Description URINE, RANDOM  Final   Special Requests NONE  Final   Culture   Final    NO GROWTH Performed at [facility], [place] [address], [place], [place] [address]    Report Status 06/21/2016 FINAL  Final     Labs: Basic Metabolic Panel:  Recent Labs Lab 06/15/16 1003 06/16/16 0808 06/20/16 0548  NA 138 135 135  K 3.9 3.5 3.6  CL 99* 97* 100*  CO2 28 28 26   GLUCOSE 136* 139* 206*  BUN 15 11 17   CREATININE 1.01* 1.07* 1.14*  CALCIUM 9.5 9.2 8.7*   Liver Function Tests:  Recent Labs Lab 06/15/16 1003  AST 25  ALT 28  ALKPHOS 53  BILITOT 1.5*  PROT 6.8  ALBUMIN 3.9     Recent Labs Lab 06/15/16 1003  LIPASE 34   No results for input(s): AMMONIA in the last 168 hours. CBC:  Recent Labs Lab 06/17/16 0243 06/18/16 0646 06/19/16 0537 06/20/16 0548 06/21/16 0709  WBC 11.1* 9.4 8.0 7.2 7.6  HGB 11.8* 10.7* 10.6* 11.8* 11.1*  HCT 33.4* 30.6* 30.4* 33.5* 31.4*  MCV 93.0 91.3 91.6 91.8 89.2  PLT 189 204 225 259 277   Cardiac Enzymes: No results for input(s): CKTOTAL, CKMB, CKMBINDEX, TROPONINI in the last 168 hours. BNP: Invalid input(s): POCBNP CBG:  Recent Labs Lab 06/20/16 1130 06/20/16 1732 06/20/16 2052 06/21/16 0812 06/21/16 1139  GLUCAP 320* 215* 267* 233* 259*    Time coordinating discharge:  Greater than 30 minutes  Signed:  Vivyan Biggers, DO [facility] Pager: [phone] 06/21/2016, 3:24 PM

## 2016-06-22 LAB — CBC
HEMATOCRIT: 30.9 % — AB (ref 36.0–46.0)
HEMOGLOBIN: 10.7 g/dL — AB (ref 12.0–15.0)
MCH: 31.1 pg (ref 26.0–34.0)
MCHC: 34.6 g/dL (ref 30.0–36.0)
MCV: 89.8 fL (ref 78.0–100.0)
Platelets: 295 10*3/uL (ref 150–400)
RBC: 3.44 MIL/uL — ABNORMAL LOW (ref 3.87–5.11)
RDW: 13.6 % (ref 11.5–15.5)
WBC: 6.7 10*3/uL (ref 4.0–10.5)

## 2016-06-22 LAB — BASIC METABOLIC PANEL
ANION GAP: 8 (ref 5–15)
BUN: 16 mg/dL (ref 6–20)
CHLORIDE: 99 mmol/L — AB (ref 101–111)
CO2: 27 mmol/L (ref 22–32)
Calcium: 8.7 mg/dL — ABNORMAL LOW (ref 8.9–10.3)
Creatinine, Ser: 0.95 mg/dL (ref 0.44–1.00)
GFR calc non Af Amer: 54 mL/min — ABNORMAL LOW (ref >60)
Glucose, Bld: 219 mg/dL — ABNORMAL HIGH (ref 65–99)
POTASSIUM: 3.5 mmol/L (ref 3.5–5.1)
Sodium: 134 mmol/L — ABNORMAL LOW (ref 135–145)

## 2016-06-22 LAB — HEPARIN LEVEL (UNFRACTIONATED): Heparin Unfractionated: 0.48 IU/mL (ref 0.30–0.70)

## 2016-06-22 LAB — PROTIME-INR
INR: 2.35
Prothrombin Time: 26.1 seconds — ABNORMAL HIGH (ref 11.4–15.2)

## 2016-06-22 LAB — GLUCOSE, CAPILLARY
GLUCOSE-CAPILLARY: 238 mg/dL — AB (ref 65–99)
Glucose-Capillary: 223 mg/dL — ABNORMAL HIGH (ref 65–99)

## 2016-06-22 MED ORDER — WARFARIN SODIUM 4 MG PO TABS: 4.0000 mg | ORAL_TABLET | Freq: Every day | ORAL | 11 refills | Status: AC

## 2016-06-22 NOTE — Progress Notes (Signed)
Chart reviewed, patient seen, refer to discharge summary done 6/12 by Dr. Carles Collet. INR is therapeutic today, OK to discharge  Michelle Herrera M. Cruzita Lederer, MD [facility] [phone]  If 7PM-7AM, please contact night-coverage [email or web address] Password [email or web address]

## 2016-06-22 NOTE — Progress Notes (Signed)
Nutrition Follow-up  DOCUMENTATION CODES:   Not applicable  INTERVENTION:  - Continue Ensure Enlive BID. - If pt unable to d/c today, continue to encourage PO intakes of meals and of supplements.  NUTRITION DIAGNOSIS:   Inadequate oral intake related to lethargy/confusion, poor appetite as evidenced by per patient/family report, meal completion < 50%. -ongoing  GOAL:   Patient will meet greater than or equal to 90% of their needs -unmet on average.   MONITOR:   PO intake, Supplement acceptance, Weight trends, Labs  ASSESSMENT:   83 y.o. female with medical history significant for diabetes mellitus, atrial fibrillation, was apparently on anticoagulation with xarelto but this was stopped because of epistaxis, further history off hypertension pyelonephritis. [facility] with worsening lower abdominal pain which patient reports it has been going on for last couple of weeks but she did not have anybody to take her to the hospital until today. Her pain was present at rest and with movement, 10 out of 10 in intensity at worst. Pain would radiate to the back bilaterally. Pain is sharp, constant and her appetite diminished because of the pain. She doesn't know if she lost any weight over that amount of time since the pain started.   6/13 Per chart review, pt consumed 25% of breakfast and refused lunch 6/11; consumed 50% of breakfast, 0% of lunch, and 25% of dinner 6/12; 0% of breakfast this AM. Weight +11 lbs since previous assessment which is likely fluid versus scale related given poor intakes. She has Ensure Enlive ordered BID and has accepted all doses since 6/10. Currently has d/c order and summary for d/c home in place.   Medications reviewed; sliding scale Novolog, daily multivitamin with minerals, 40 mg oral Protonix/day, 1000 mcg oral vitamin B12/day.  Labs reviewed; CBG: 223 mg/dL this AM, Na: 134 mmol/L, Cl: 99 mmol/L, Ca: 8.7 mg/dL, GFR: 54  mL/min.    6/8 - Per review, she consumed 90% of breakfast and 5% of lunch yesterday and 5% of breakfast this AM (pt reports this was scrambled eggs, bacon, and coffee).  - Daughter-in-law, who is at bedside, reports that pt is a picky eater.  - Pt states her son often brings her food such as potato soup, chicken tenders, hamburgers. - She was also drinking Ensure one-two times/day PTA and would like to receive this supplement during admission.  - Reviewed MD note from yesterday: states noncompliance with DM medications and recommendation for medication for dementia. - Missing several front teeth on the upper and lower.  - She has an upper partial but only wears it when she goes out.  - She denies any difficulties with foods when she is not wearing partial.  - Physical assessment shows mild/moderate muscle wasting to shoulder and clavicle area but no muscle or fat wasting to any other areas.  - Pt unsure of weight changes PTA. Per chart review, she has lost 7 lbs (6% body weight) in the past 3 months which is not significant for time frame.  - Pt does not meet criteria for malnutrition at this time but will continue to monitor.  IVF: NS @ 75 mL/hr.    Diet Order:  Diet Carb Modified Fluid consistency: Thin; Room service appropriate? Yes  Skin:  Reviewed, no issues  Last BM:  6/11  Height:   Ht Readings from Last 1 Encounters:  06/15/16 5\' 5"  (1.651 m)    Weight:   Wt Readings from Last 1 Encounters:  06/22/16  124 lb 1.9 oz (56.3 kg)    Ideal Body Weight:  56.82 kg  BMI:  Body mass index is 20.65 kg/m.  Estimated Nutritional Needs:   Kcal:  1290-1445 (25-28 kcal/kg)  Protein:  52-62 grams (1-1.2 garms/kg)  Fluid:  >/= 1.3 L/day  EDUCATION NEEDS:   No education needs identified at this time    Jarome Matin, MS, RD, LDN, CNSC Inpatient Clinical Dietitian Pager # [phone] After hours/weekend pager # [phone]

## 2016-06-22 NOTE — Progress Notes (Signed)
[place] for Heparin IV + warfarin Indication: bilateral renal embolism, afib  Allergies  Allergen Reactions  . Peanut-Containing Drug Products Anaphylaxis and Swelling  . Azithromycin Other (See Comments)    REACTION: pt states INTOL to ZPak  . Flomax [Tamsulosin Hcl] Swelling  . Pioglitazone Other (See Comments)    edema  . Pregabalin Other (See Comments)    REACTION: pt states INTOL to Lyrica  . Sulfonamide Derivatives Swelling    Patient Measurements: Height: 5\' 5"  (165.1 cm) Weight: 124 lb 1.9 oz (56.3 kg) IBW/kg (Calculated) : 57 Heparin Dosing Weight: TBW  Vital Signs: Temp: 98.1 F (36.7 C) (06/13 0518) Temp Source: Oral (06/13 0518) BP: 138/71 (06/13 0518) Pulse Rate: 82 (06/13 0518)  Labs:  Recent Labs  06/20/16 0548 06/21/16 0709 06/22/16 0730  HGB 11.8* 11.1* 10.7*  HCT 33.5* 31.4* 30.9*  PLT 259 277 295  LABPROT 17.3* 22.9* 26.1*  INR 1.40 1.99 2.35  HEPARINUNFRC 0.53 0.50 0.48  CREATININE 1.14*  --  0.95    Estimated Creatinine Clearance: 39.9 mL/min (by C-G formula based on SCr of 0.95 mg/dL).  Assessment: 29 yoF admitted on 6/6 with lower abdominal pain.  CT abdomen was suspicious for embolic disease in the kidneys, right kidney specifically versus possible pyelonephritis.  PMH includes HTN, DM, AFib no longer on Xarelto, previously known bilateral renal emboli. Reported black stools prior to admission, but FOBT negative. She tolerated low dose ASA without bleeding, and pharmacy is now consulted to dose Heparin IV.     Baseline INR, aPTT: WNL  Prior anticoagulation: none, previously on Xarelto  Significant events: 6/8: patient and family agree to start warfarin to manage renal emboli   Today, 06/22/2016:  Heparin level 0.48, therapeutic  INR 2.35 which is therapeutic x 2 days after 5 doses of coumadin 4 mg  CBC: Hgb low but stable, Plt WNL  No bleeding or infusion issues per nursing  Per CM: [facility]  chosen for [facility] and PT/INR checks  Goal of Therapy: Heparin level 0.3-0.7 units/ml INR 2-3 Monitor platelets by anticoagulation protocol: Yes   Plan:  Dc home on warfarin 4 mg daily with [facility] to draw INR 6/15 then weekly x 4  to Dr Lenna Gilford  Eudelia Bunch, Pharm.D. [phone] 06/22/2016 8:51 AM

## 2016-06-22 NOTE — Care Management Note (Signed)
Case Management Note  Patient Details  Name: Michelle Herrera MRN: [id number] Date of Birth: 11-10-1930  Subjective/Objective:  D/c home w/[facility]HC-[facility] rep aware of [place] orders & d/c.No further CM needs.                  Action/Plan:d/c home w/HHC.   Expected Discharge Date:  06/22/16               Expected Discharge Plan:  [place]  In-House Referral:  Clinical Social Work  Discharge planning Services  CM Consult  Post Acute Care Choice:    Choice offered to:  Adult Children  DME Arranged:    DME Agency:     HH Arranged:  RN, PT [place] Agency:  [facility]  Status of Service:  Completed, signed off  If discussed at [place] of Stay Meetings, dates discussed:    Additional Comments:  Dessa Phi, RN 06/22/2016, 10:50 AM

## 2016-06-24 ENCOUNTER — Telehealth: Payer: Self-pay | Admitting: Pulmonary Disease

## 2016-06-24 NOTE — Telephone Encounter (Signed)
Received a call from [place] from [facility]. Patient was recently in the hospital and was diagnosed with a home health nurse to check her PT and INR. Malachy Mood stated that the patient's PT was 37.2 and INR was 3.1. Pt is currently taking 4mg  of warfarin a day.   She needs verbal orders of when to check patient's levels again and if the warfarin needs to be adjusted.    CB # is [phone]  SN, please advise

## 2016-06-24 NOTE — Telephone Encounter (Signed)
Per SN- hold coumadin and recheck on Monday 06/27/16 and call to report  Spoke with Malachy Mood and notified and she verbalized understanding  Nothing further needed

## 2016-06-29 ENCOUNTER — Telehealth: Payer: Self-pay | Admitting: Pulmonary Disease

## 2016-06-29 NOTE — Telephone Encounter (Signed)
Will route message to SN to make him aware. 

## 2016-06-29 NOTE — Telephone Encounter (Signed)
Called and spoke with Angie from [employer] and she stated that she did draw the pt on Monday for her protime check but had to send to the lab and received the results today.  She stated that the pt did take 4 mg coumadin on Monday and Tuesday.  SN is aware and the labs were:  6/17  37.2/INR 3.2--told to hold 15,16, 17 and repeat PT on 6/18.    So per SN---pt to take 1/2 tablet of the coumadin daily and recheck on Monday.  Angie is aware and will call back with results on Monday.  Angie stated that she will relay this information to the pt.

## 2016-06-30 ENCOUNTER — Ambulatory Visit (INDEPENDENT_AMBULATORY_CARE_PROVIDER_SITE_OTHER): Payer: Medicare Other | Admitting: Pulmonary Disease

## 2016-06-30 ENCOUNTER — Telehealth: Payer: Self-pay | Admitting: Pulmonary Disease

## 2016-06-30 ENCOUNTER — Encounter: Payer: Self-pay | Admitting: Pulmonary Disease

## 2016-06-30 VITALS — BP 110/72 | HR 74 | Temp 97.2°F | Ht 65.0 in

## 2016-06-30 DIAGNOSIS — I10 Essential (primary) hypertension: Secondary | ICD-10-CM | POA: Diagnosis not present

## 2016-06-30 DIAGNOSIS — R269 Unspecified abnormalities of gait and mobility: Secondary | ICD-10-CM | POA: Diagnosis not present

## 2016-06-30 DIAGNOSIS — I482 Chronic atrial fibrillation: Secondary | ICD-10-CM

## 2016-06-30 DIAGNOSIS — I4821 Permanent atrial fibrillation: Secondary | ICD-10-CM

## 2016-06-30 DIAGNOSIS — E0840 Diabetes mellitus due to underlying condition with diabetic neuropathy, unspecified: Secondary | ICD-10-CM | POA: Diagnosis not present

## 2016-06-30 DIAGNOSIS — R1030 Lower abdominal pain, unspecified: Secondary | ICD-10-CM

## 2016-06-30 DIAGNOSIS — N280 Ischemia and infarction of kidney: Secondary | ICD-10-CM | POA: Diagnosis not present

## 2016-06-30 DIAGNOSIS — R413 Other amnesia: Secondary | ICD-10-CM | POA: Diagnosis not present

## 2016-06-30 NOTE — Telephone Encounter (Signed)
Pt was seen in the office today and the results were given to SN today by the pt and her son.

## 2016-06-30 NOTE — Patient Instructions (Signed)
Today we updated your med list in our EPIC system...    Continue your current medications the same...  We will be frequently adjusting the Coumadin (Warfarin) dosing>>    She has the 4mg  tablets...    Currently we want her taking 1/2 tab (total of 2mg ) each evening...    The nurse should check your PROTIME blood test next Monday 6/25 & call the result to me the same day!  We added back a lower dose of the METFORMIN 500mg  diabetes drug>    Start taking one tab (500mg ) twice daily (in AM & in eve)...  Try to be as active as possible & be safe- no falling allowed!!!  Call for any questions...  Let's plan a follow up visit in 2weeks & we will recheck blood work at that time.Marland KitchenMarland Kitchen

## 2016-06-30 NOTE — Progress Notes (Signed)
Subjective:    Patient ID: Michelle Herrera, female    DOB: 1930/04/08, 83 y.o.   MRN: [id number]  HPI 83 y/o WF here for a follow up visit...  SEE PREV EPIC NOTES FOR OLDER DATA >>      Adm 1/13 w/ renal infarcts felt due to micro-emboli & inadeq INR ==> changed to [place].  Hosp 5/14 for NSTEMI=> stent to RCA.  LABS 10/14:  FLP- looks good on Lip40;  Chems- ok x BS=181, A1c=8.8.Marland KitchenMarland Kitchen  Hosp 3/15 w/ NSTEMI & cath showed widely patent coronaries & EF=50%.  LABS 4/15:  Chems- ok x BS=181, A1c=9.3;  LFTs- wnl;  CBC- wnl;  Fe=121 (31%sat);    2DEcho 05/2013 showed mild LVH, EF=50-55%, mild AI, bileaflet MVP w/ mod MR, severe biatrial enlargement, PFO suspected, severe TR, PAsys=71mHg  ADDENDUM>> she notes that she has no appetite & "not eating"; she has lost 21# in 628moown to 109# & BMI=18; but serum proteins WNL; we will treat w/ MEGACE 20015mefore meals Tid... ~  October 23, 2013:  BetTayllors decreased both of her DM meds in half as she says she doesn't tolerate the max doses due to "feeling bad, GI upset, & diarrhea";  She has DM w/ neuropathy> prev on Metform500-2Bid & Glipiz10-2Bid but INTOL she says & decreased to 2 of each Qam only; prev followed by DrEllison but last seen 6/13- Actos stopped due to edema; won't take additional meds due to cost; refuses insulin shots; she claims that BS higher when her knee is hurting; Labs 10/15 showed BS=172, A1c=8.4 & asked to ADD Metform500 & Glipiz10 (one of each) in PM at dinner => NOTE: she did NOT incr the Metform & glipizide as requested...   ~  April 24, 2014:  42mo62mo Cabantes she's doing well- CC is hip pain & she notes that Tylenol helps;     HBP, CAD, cardiomyop (sys & diast CHF), MR> on Aten50, Cardizem120, Lasix20-2/d; BP= 110/70 & she reports doing well- no CP etc...    PAF, CHB, pacer> followed by DrTaylor on Xarelto15 (decreased due to epistaxis); she reports stable- no CP, palpit, dizzy, nose bleeds etc...    Chol> on Lip40;  FLP 4/16 showed TChol 214, TG 106, HDL 86, LDL 107... We reviewed diet, continue same med.    DM> on Metform500-2Qam & Glucotrol10-2Qam; compliance is ?? She has been reluctant to follow my recs for increased meds; Labs 4/16 showed BS=175, A1c=9.4 => refer to [facility] for DM management.    Other problems as listed> on Neurontin100Tid, MVI, B12-1000/d... We reviewed prob list, meds, xrays and labs> see below for updates >>   CXR 4/16 showed mod cardiomeg, hyperinflation, sl blunt angles c/w pleural thickening, DJD Tspine, osteopenia, NAD...  LABS 4/16:  FLP- note quite at goals;  Chems- ok x BS=175, A1c=9.4;  CBC- wnl;  TSH=4.61;  BNP=438...  ADDENDUM>> she REFUSED referral to Endocrinology for DM mamagement, refuses to increase her DM meds per my recommendations, refuses to start insulin therapy; she says that she will continue current meds and get on diet + exercise to get her BS improved & A1c better...  ~  October 24, 2014:  [name] a quiet interval, notes some LBP & this is helped by Tramadol 1/2 tab + Tylenol...    HBP, CAD, cardiomyop (sys & diast CHF), MR> on Aten50, Cardizem120, Lasix20-2/d; BP= 138/64 & she reports doing well- no CP etc; she needs incr exercise...    PAF,  CHB, pacer> followed by DrTaylor on Xarelto15/d (decreased due to epistaxis); she reports stable- no CP, palpit, dizzy, nose bleeds etc...    Chol> on Lip40; FLP 4/16 showed TChol 214, TG 106, HDL 86, LDL 107... We reviewed diet, continue same med.    DM> on Metform500-2Qam & Glucotrol10-2Qam; compliance is ?? She has been reluctant to follow my recs for increased meds; Labs 4/16 showed BS=175, A1c=9.4 => refer to [facility] for DM management (she refused again).    Other problems as listed> on Neurontin100Tid, MVI, B12-1000/d... We reviewed prob list, meds, xrays and labs>  IMP/PLANS>>  Reminded to take meds regularly & again asked to bring all med bottles to every office visit; she continues to refuse to  increase her DM meds or go to an endocrinologist for DM management; Her CC is the back pain but Tramadol50 makes her too groggy- rec to take 1/2 tab + Tylenol, up to Tid as needed...   ~  April 23, 2015:  56moRKoshkononghad some dental work w/ a tooth pulled 03/19/15- developed some bleeding that wouldn't stop & went to ER (she is on Xarelto- held that one day); treated w/ gauze & a tea-bag w/ control...     She had f/u [facility]- DrTaylor 12/18/14> note reviewed- chr AFib, HBP, CAD s/p stent to RCA 2014, combined sys & diast CHF, valv dis w/ mod MR/ severe TR/ and mod PulmHTN (591mg); no CP, chrDOE (class2); Same meds- on Xarelto, Aten50, Cardizem120, Lasix40, & no changes...     HBP> on Aten50, Diltiazem120 & Lasix20-2Qam; BP= 138/86 & she denies CP/ ch in SOB/ edema, notes occas palpit...    CAD, Cardiomyopathy (sys & diast CHF), Valvular Dis w/ MR etc> off ASA81 due to epistaxis, on Xarelto15 (lowered dose due to nose bleed) & above meds; Adm 5/14 by [facility] w/ NSTEMI, Cath=> stent to 95%RCA; followed by DrTaylor; Adm 1/15 & 3/15 w/ AFib, RVR, NSTEMI due to demand ischemia and recath w/ widely patent coronaries, EF=50%; meds adjusted...    AFib, CHB & Pacer> on Xarelto at 15 (due to epistaxis); followed by DrTaylor & seen 12/16> cardiology notes are reviewed...    Chol> on Lip40; weight is up to 128#, BMI=21; FLP 4/16 shows TChol 214, TG 106, HDL 86, LDL 107 & reminded to take med every day!    DM w/ neuropathy> on Metform500Bid & Glipiz10-2Bid but ?what she is taking (didn't brink bottles); prev followed by DrEllison- Actos stopped due to edema; won't take additional meds due to cost; refuses insulin shots; she claims that BS higher when her knee is hurting; Labs 4/16 showed BS=175, A1c=9.4;  Labs 4/17 showed BS=268, A1c=10.6;  I confirmed w/ [facility] ([name]) that pt is NOT filling meds regularly (Metform= 04/04/15 for 30d supply & 01/20/15 prior to that;  Glipizide= 02/26/15 for 30d supply & 12/11/14  prior to that);  We will again try to refer to Endocrine (she has refused mult times).    GI- HH, colon polyps, s/p GB> stable she says, not on regular PPI dosing, last colon was 2003 by DrStark w/ 39m13molyp & divertics; denies abd pain, n/v, d/c, blood seen...    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     DJD, LBP, right shoulder pain> on Neurontin100Tid; Ortho evals from  DrNorris/ [facility]- prev knee surg for torn cartilage, back discomfort treated w/ Tramadol & Tylenol...    Osteoporosis, Vit D defic> Vit D level 2013 = 30 and she was asked to incr her OTC Vit D supplement to 2000u daily; needs f/u BMD but she wants to wait...    Memory Loss> Hx 2 sm infarcts on prev MRI, on Xarelto from [facility]; she declines Aricept etc...    Anxiety> mod stress in the family, she declines anxiolytic meds...    B12 defic> on OTC oral B12 supplement 1000u daily...    Medication non-compliance>  This is well documented at each & every office visit... EXAM shows Afeb, VSS, O2sat=98% on RA;  HEENT- dental problems, mallampati1;  Chest- clear w/o w/r/r;  Heart- Irreg Afib, gr1-2/6 DEM w/o r/g heard;  Abd- soft, nontender, neg;  Ext- VI, w/o c/c/e;  Neuro- intact w/o focal neuro deficits...  LABS 04/23/15>  FLP- not at goals, not taking meds regularly;  Chems- ok x BS=268, A1c=10.6;  CBC- wnl;  Thyroid=3.51;  Reminded to take all meds every day! IMP/PLAN>>  Eriyana is still NOT taking her meds regularly- asked to fill all Rx every month & take all meds as prescribed every day!  We will attempt to get her to see DrGherghe at [facility] (she once again refused...   ~  October 26, 2015:  68moROV & Noela reports that she is doing well, feeling good, & no new complaints or concerns;  She denies CP,  palpit, edema; states her breathing is OK & denies cough, sput, SOB; it is apparent that she is still NOT exercising at all... we reviewed the following medical problems during today's office visit >>     BP & cardiac controlled w/ Aten50, [id number] Lasix40, Xarelto15; BP=124/82 today & she remains asymptomatic...    Chol treated w/ diet + Lip40 but compliance is questioned; she did not bring pill bottles or med list to todays OV; [place]4/17 showed TChol 216, TG 115, HDL 80, LDL 113 & reminded to take med every day...    DM treated w/ Metform + Glipizide but she doesn't take what is prescribed- admits to taking Metform500-2Qam & Glipiz10-2Qam but no PM doses; Labs today showed     GI/GU- stable, she wears depends & take Ditropan due to her incont but this system is working satis for her...    She has diffuse DJD, LBP, osteoporosis etc; on Neurontin, Tramadol, Tylenol prn... EXAM shows Afeb, VSS, O2sat=97% on RA;  HEENT- dental problems, mallampati1;  Chest- clear w/o w/r/r;  Heart- Irreg Afib, gr1-2/6 DEM w/o r/g heard;  Abd- soft, nontender, neg;  Ext- VI, w/o c/c/e;  Neuro- intact w/o focal neuro deficits...  LABS 10/26/15>  Chems- ok x BS=296, A1c=10.6 IMP/PLAN>  BZadayacontinues to treat herself by taking the meds as she wants & not as prescribed; she has also tied my hands as she will not take any of the newed (more expensive) DM meds and wil not agree to insulin rx; finally I have tried to refer her to endocrine/ DM specialist but she has repeatedly refused to go; we reviewed low carb/ DM diet & rec to take the meds as follows- Metform500- 2Qam & 1Qpm plus the Glipizide10-2Qam & 1Qpm as well... She has again declined my offer to send her to a DM specialist for their review... Note: >50% of this 25 min appt was spent in counseling & coordination of care.  ~  March 10, 2016:  4-550mo  ROV & Jaymarie was [facility] 2/19 - 03/02/16 by [facility] w/ left CP radiating to the epigastrium & worse supine; ER eval revealed EKG  w/ Afib & NSSTTWA, CXR w/ cardiomeg & some hyperinflation w/ chr changes, LABS ok x BS=288 & troponins neg, UTI (?pyelo) w/ tntc WBCs=> EColi resist to Quinolones (Rx Rocephin=>Keflex);  CT Abd revealed mult findings (see below) + 2 lucent lesions in the pancreas up to 29m size & [facility] did not evaluate further, rather they disch pt to get the needed MRI Abd as an outpt after discharge...     Pt was last seen by [facility], DrTaylor 10/14/15>  HBP, chr AFib, CAD- s/p stent in RCA 2014;  Last 2DEcho in 2015 revealed numerous abnormalities (EF=50-55%, valvular heart dis, severe biatrial enlargement?PFO, severe TR & PAsys=471mg);  They did not rec any changes to meds & asked to f/u 1y17yr NOTE: pt is on Xarelto15 per DrTaylor but she has never purchased this med- she only takes it if she get samples from the [facility] office    BP & cardiac treated w/ Aten50, [id number], Xarelto15, she is off the Lasix; BP=134/72 today & she notes the chest discomfort, SOB/DOE, denies edema...    Chol treated w/ diet + Lip40 but compliance is questioned; she did not bring pill bottles or med list to todays OV; [place] showed TChol 216, TG 115, HDL 80, LDL 113 & reminded to take med every day...    DM treated w/ Metform1000Bid + Glipizide10-2Bid but she doesn't take what is prescribed- admits to taking Metform500-2Qam & Glipiz10-2Qam but no PM doses; she refuses Endocrine referral, refuses to change meds or take insulin shots; LABS 02/2016 showed BS=164, A1c=11.7 AND SHE AGAIN REFUSES ALL INTERVENTIONS on her behalf...    GI/GU- stable, she wears depends & take Ditropan due to her incont but this system is working satis for her...    She has diffuse DJD, LBP, osteoporosis etc; on Neurontin, Tramadol, Tylenol prn... EXAM shows Afeb, VSS, O2sat=99% on RA;  HEENT- dental problems, mallampati1;  Chest- clear w/o w/r/r;  Heart- Irreg Afib, gr1-2/6 SEM w/o r/g heard;  Abd- soft, nontender, neg;  Ext- VI, w/o c/c/e;  Neuro- intact w/o  focal neuro deficits...  CXR 02/29/16 (independently reviewed by me in the PACS system) showed stable cardiomeg, tortuous Ao, chronic lung changes- NAD... Marland KitchenMarland KitchenKG 02/29/16 showed Afib, ratVOPF29-24AD, NSSTTWA...  CT Abd&Pelvis 03/01/16 showed cortical hypoattenuation of the right kidney, mult cortical scars bilaterally (likely prior ischemia or infection), punctate nonobstructing left renal stone; 2 lucent lesions in pancreas body & tail measuring up to 41m3mrec MRIw/ contrast; extensive divertics, severe aortic calcif, severe right ventric & atrial enlargement,  smal bilat inguinal & femoral hernias containing fat...  2DEcho 03/01/16> AFib, norm LV size w/ EF=55% & no RWMA, modMR, severeTR, severe biatrial enlargement, mod RV dilation w/ decr sys function & mod pulmHTN w/ PAsys=59mm3m.  LABS 02/2016>  Chems- ok x BS=164, A1c=11.7;  CBC- wnl x wbc=14.7;  UTI w/ EColi resist to Cipro, sens to keflex... IMP/PLAN>>  BettyTahjaeserious multisystem dis- CARDIAC, Pulm, DM, GI/GU, etc; she is her own worst enemy as she won't take meds regularly, refuses additional consults eg-Endocruine/DM, refuses to change her meds or consider insulin shots,  Most recent Hosp for CP=> Abd pain showed abnormal 2DEcho & abn CT Abd as noted;  Radiology & [facility] hospitalists want us toKorearder MRI abd w/ contrast to f/u on the pancreatic lesions & we will comply;  Pt rec to take Prilosec & Pepcid...  ADDENDUM>>  MRI Abd, MRCP w/ contrast 03/18/16>>  Tiny benign appearing cystic lesions in the pancreas, largest=38m size in the uncinate process; incidental 1cm left adrenal adenoma, no suspicious renal lesions, atherosclerosis of the Ao, no lymphadenopathy...  Pt informed that this looks benign... NOTE: Pt is sure her abd discomfort is coming from her DM meds => needs Endocrine consult for DM management...  ~  April 25, 2016:  6wk RSouth Heightswas a no show for her DM appt w/ DrEllison- actually she showed up 3/23 for her 3/22 appt & when  they offered to resched she said "forget it";  She was similarly a "no show" for her Afib clinic appt 3/6 & [place]  We gave a copy of her Med List to her son DShanon Brow#[phone])...     Today she returns to the office by herself feeling fine- no complaints- didn't bring meds to the office, didn't bring list, "just what my son gives me" she says & this makes her care exceedingly difficult...    HBP, CAD, sys&diast CHF, valv hrt dis w/ MR, Afib, heart block=>pacer>  Followed by DrTaylor on XEarnstine Regal(she is out "they won't give me no more"), Aten50, CardizemCD120, Losartan25; ?what she is taking- EKG today w/ Afib controlled VR w/ HR=85, NSSTTWA;  She needs f/u appt in AFib clinic...    Chol> supposed to be on Lip40; last FLP 4/17 showed TChol 216, TG 115, HDL 80, LDL 113; we reviewed low chol/ low fat diet & medication compiance...    DM> as noted she never saw DrEllison & has refused to take any other meds or insulin shots for me- on Metform500-2Bid & Glucotrol10Bid; last labs showed A1c=11.7 (02/29/16) and BS=340 (03/12/16); we will resched w/ Endocrine.    GI/GU- 160mcystic lesion of pancreas on MRI; she wears depends & take Ditropan due to her incont but this system is working satis for her...    She has diffuse DJD, LBP, osteoporosis etc; on Neurontin, Tramadol, Tylenol prn... EXAM shows Afeb, VSS, O2sat=99% on RA;  HEENT- dental problems, mallampati1;  Chest- clear w/o w/r/r;  Heart- Irreg Afib, gr1-2/6 SEM w/o r/g heard;  Abd- soft, nontender, neg;  Ext- VI, w/o c/c/e;  Neuro- intact w/o focal neuro deficits...  EKG 04/25/16 showed AFib, rate85, NSSTTWA, NAD...   LABS 04/25/16>  BS= 174 today in the office... IMP/PLAN>>   I stressed to BeAlayhahe importance of her specialty clinics including AFib clinic w/ consideration of Coumadin anticoag instead of Xarelto which is unaffordable for her; also the Endocrine/ DM cilinic w/ DrEllison to effect better  control of her DM...    ~  June 30, 2016:  49m71moV & post hosp check>  [place] [facility] - [facility]16 by [facility] after presenting w/ abd discomfort w/ radiation to her back x several weeks; pain was sharp severe & persistent,  CT Abd was suspicious for embolic dis to the right kidney w/ renal infarction => as prev well documented, the patient was supposed to be on Xarelto20 from DrTYahoor her AFib but SHE REFUSED TO TAKE THE XARELTO UNLESS PROVIDED w/ SAMPLES FROM HIS OFFICE, she had been out of this med for ?wks ?months; they placed her on Heparin=> [place] [facility] for [place] manage for her... The other problem/ change in [facility] her DM- A1c was 11.7 in 2/18 & in [facility] was  easily managed & f/u A1c=6.2?!*  Since disch son says that sugars have been high >300 on their check=> we will re-start Metform573mBid... We reviewed the following medical problems during today's office visit >>      HBP> on Aten50, Diltiazem120 & off prev Losar & Lasix; BP= 110/72 & she denies CP/ ch in SOB/ edema, notes occas palpit...    CAD, Cardiomyopathy (sys & diast CHF), Valvular Dis w/ MR etc>     AFib, CHB & Pacer>  off ASA81 due to epistaxis, prev on Xarelto15 but compliance very poor; Adm 5/14 by [facility] w/ NSTEMI, Cath=> stent to 95%RCA; followed by DrTaylor; Adm 1/15 & 3/15 w/ AFib, RVR, NSTEMI due to demand ischemia and recath w/ widely patent coronaries, EF=50%; meds adjusted;  Adm 6/18 w/ abd pain from renal infarction=> started back on Coumadin & sent to uKoreato manage this!    He last saw DrTaylor 10/14/15 & she missed AFib clinic appt 03/15/16> chr AFib, HBP, CAD-s/p stent in RCA 2014; no angina, BP controlled, AFib w/ controlled VR; pt asked to f/u 110yr.    Chol> on Lip40; weight is down to 114#, BMI=20; FLP 4/17 shows TChol 216, TG 115, HDL 79, LDL 113 & med compliance is suspect- reminded to take med every day!    DM w/ neuropathy> prev on Metform500Bid & Glipiz10-2Bid but ?what she is taking  (didn't brink bottles); prev followed by DrEllison- Actos stopped due to edema; won't take additional meds due to cost; refuses insulin shots; she claims that BS higher when her knee is hurting; Labs 4/16 showed BS=175, A1c=9.4;  Labs 4/17 showed BS=268, A1c=10.6; Labs 2/18 showed BS=288, A1c=11.7;  I confirmed w/ [facility] ([name]) on mult occas that she is NOT filling her 2 meds regularly!  We will again try to refer to Endocrine (she has refused mult times)=> she "no showed" for appt w/ DrEllison 03/31/16;  Post Hosp 6/18> BS was easily controlled in hosp & BS=136-219, A1c=6.2;  She was disch on Glipizide10Bid, off Metformin but son reports BS>300 at home & we will re-start Metform500Bid...    GI- HH, colon polyps, s/p GB> stable she says, not on regular PPI dosing, but disch 6/18 on Pepcid20Qhs; last colon was 2003 by DrStark w/ 10m51molyp & divertics; denies abd pain, n/v, d/c, blood seen...    Urinary incont & Hx renal infarction, ?bilat hydroneph on MRI spine > ?followed by DrEskridge (last note 2015)- hx renal infarct 1/13, bilat extrarenal pelvis, & left peri-pelvic cysts, small bladder capacity, known cystocele, trial of Myrtetriq/Detrol was unsuccessful; DrEskridge reviewed & no hydro seen; Hx AFib & supposed to be on Xarelto per DrTaylor, pt refuses to take this med unless given samples by [facility] & they ran out=> pt went un-anticoagulated & developed abd pain 06/2016=> Hosp w/ renal infarction & improved back on COUMADIN anticoag...    DJD, LBP, right shoulder pain> on Neurontin100Tid; Ortho evals from DrNorris/ [facility]- prev knee surg for torn cartilage, back discomfort treated w/ Tramadol & Tylenol...    Osteoporosis, Vit D defic> Vit D level 2013 = 30 and she was asked to incr her OTC Vit D supplement to 2000u daily; needs f/u BMD but she wants to wait...    Memory Loss, MCI, Dementia> Hx 2 sm infarcts on prev MRI, prev on Xarelto from [facility] but as noted compliance was poor; she declines  Aricept etc...    Anxiety> mod stress in the family, she declines anxiolytic meds...    B12 defic>  on OTC oral B12 supplement 1000u daily; Epic labs showed B12=135 in 2011 and improved on recheck in 2013 to 905...    Medication non-compliance>  This is well documented at each & every office visit; Also requested to bring all med bottles to each visit & she NEVER does!...  EXAM shows Afeb, VSS, O2sat=96% on RA;  HEENT- dental problems, mallampati1;  Chest- clear w/o w/r/r;  Heart- Irreg Afib, gr1-2/6 SEM w/o r/g heard;  Abd- soft, nontender, neg;  Ext- VI, w/o c/c/e;  Neuro- intact w/o focal neuro deficits...  Last 2DEcho 03/01/16> AFib, norm LV size w/ EF=55% & no RWMA, modMR, severeTR, severe biatrial enlargement, mod RV dilation w/ decr sys function & mod pulmHTN w/ PAsys=41mHg...  Last EKG 04/25/16 showed AFib, rate 85, IVCD and NSSTTWA...  CT Abd/Pelvis 06/15/16>  Interval development of segmental areas of non-perfusion in both kidneys suggesting embolic dis; stable cystic lesion in the pancreas; Abd Ao atherosclerosis; left colon divertics...   LABS 06/2016 in Epic>  Chems- ok w/ BS=136-206, Cr=1.0-1.2;  CBC- ok x Hg=10.6-11.8 IMP/PLAN>>  BEllajanepresents w/ a complex/ challenging management problem- her mental status & that of her caregiver son (Thompson Caul will not allow them to supervise her meds=> this will have to fall on her son- DShanon Browwho works at [employer].. For now we will require Protimes in our office every Monday AM & we will call DShanon Broww/ changes in her med (so he can place them in her Week-sized pill case... For the DM we reviewed diet & rec that BInez Catalinare-start Metform500 Bid + continue the Glipizide 129mid... We plan rov recheck in 2wks... Note:  >50% of this 6081mov was devoted to counseling & coordination of care...            Problem List:     HYPERTENSION >> VALVULAR HEART DISEASE >> ATRIAL FIBRILLATION - stable on Rx w/ XARELTO (but she would not fill rx, only used samples)  & rate control strategy => shitched to Coumadin via [facility] protocol. ~  4/11:  she reports that she stopped her Lanoxin 0.125 on her own & doesn't want to restart. ~  8/11:  f/u DrTaylor & stable on Coumadin w/ rate control strategy, no changes made. ~  11/11:  Hosp by Neuro w/ TIA> 2DEcho showed mild LVH, diffuse HK w/ EF= 45%, thick pos MV leaflet w/ restrict motion & modMR, dilated LA&RA, severeTR & PAsys=49, can't r/o PFO... ~  8/12:  her BP=116/84 and she feels OK- denies HA, fatigue, visual changes, CP, palipit, dizziness, syncope, dyspnea, edema, etc... ~  12/12:  BP= 126/88 and she remains asymptomatic as above... ~  1/13:  2DEcho showed mild LVH w/ EF= 40-45% & diffuse HK, Diastolic dysfunction, modMR, severe LAE, modTR, Pasys=45 ~  [facility] due to the renal infarcts & difficulty maintaining INR. ~  CXR 1/13 showed marked cardiomegaly, pulm vasc congestion, NAD... ~  4/13:  BP= 128/60 and she is feeling better she says;  BUN=15, Creat=0.8; She had f/u DrTaylor> noted to be doing well w/o symptoms- continue same meds & low Na. ~  EKG 4/13 showed AFib, rate 77, right ward axis & NSSTTWA... ~  8/13:  BP= 110/82 and she denies current CP, palpit, SOB, edema... ~  4/14: on Aten50Bid; BP= 130/84 & she denies CP/ SOB/ edema, notes occas palpit. ~  5/14:  Adm by [facility]> NSTEMI, Cath w/ LAD minor irreg only, LCirc w/ ostial 40-50%, mid  RCA 95%, EF 55%. PCI: Vision BMS to the mid RCA. There was moderate MR on left ventriculogram;  ~  CXR 5/14 showed cardiomeg, underlying COPD w/ apical scarring, DJD in spine, NAD... ~  EKG 6/14 showed AFib, rate72, NSSTTWA...  ~  2DEcho 6/14 showed norm LV size & funct w/ EF=55%, mild MR, severe RA&LA dil, modTR,  ~  Adm 1/15 & 3/15 w/ CP, AFib w/ RVR, had NSTEMI 3/15 from demand ischemia; cath w/ patent coronaries, EF=50%; meds adjusted w/ stop ASA & decr Xarelto to 19m due to severe epistaxis... ~  CXR 3/15 showed cardiomegaly,  hyperinflated lungs, biapical pleural thickening, NAD... ~  EKG 3/15 showed AFib, rate74, NSSTTWA... ~  4/15: on Aten50Bid, Diltiazem120, & Lasix20; also taking Xarelto15; BP= 110/64 & she denies CP/ SOB/ edema, notes occas palpit... ~  10/15:  on Aten50Bid, Diltiazem120,  & Lasix20-2Qam, Xarelto15; BP= 110/64 & she claims asymptomatic... ~  4/16: on Aten50, Cardizem120, Lasix20-2/d, Xarelto15; she reports doing well- no CP etc; BP= 110/70... ~  CXR 4/16 showed mod cardiomeg, hyperinflation, sl blunt angles c/w pleural thickening, DJD Tspine, osteopenia, NAD. ~  [facility] believed due to renal infarction from AFib & not on anticoag;  She was heparinized 7 transitioned to Coumadin;  disch onCoumadin to [place]...  HYPERCHOLESTEROLEMIA - on diet + SIMVASTATIN 4106md... ~  [place] T[place]ol 160, TG 143, HDL 55, LDL 76 ~  FLP 7/09 showed TChol 165, TG 129, HDL 60, LDL 80 ~  FLP 10/10 showed TChol 175, TG 68, HDL 80, LDL 82 ~  FLP 4/11 showed TChol 207, TG 104, HDL 84, LDL 111... rec better diet, continue same med. ~  FLP 11/11 on Simva40 showed TChol 180, TG 71, HDL 67, LDL 99 ~  FLP 8/12 on Simva40 showed TChol 196, TG 96, HDL 89, LDL 88 ~  FLP 4/13 on Simva40 showed TChol 211, TG 77, HDL 85, LDL 112... rec better diet, same med. ~  FLP 10/14 on Lip40 showed TChol 138, TG 63, HDL 65, LDL 61  ~  FLP 10/15 on Lip40 showed  TChol 127, TG 54, HDL 69, LDL 47 ~  FLP 4/16 on Lip40 showed TChol 214, TG 106, HDL 86, LDL 107   TYPE 2 DIABETES MELLITUS, Uncontrolled, w/ Neuropathy >>  ~  on METFORMIN 50059m2tabsBid, GLIPZIDE 45m73m (not taking Januvia due to $$)... ~  labs 1/09 showed BS= 148, HgA1c= 7.1... cMarland KitchenMarland Kitchentinue meds + better diet... ~  labs 7/09 showed BS= 168, HgA1c= 7.6... iMarland KitchenMarland Kitchenr Metform 2Bid, contin Glip 10Bid & Januv... ~  she stopped the Januvia due to cost... ~  labs 4/10 showed BS= 185, Aic= 8.0... rMarland KitchenMarland Kitcheninder to take meds regularly. ~  labs 10/10 on  Metform2Bid+Glip10Bid showed BS= 124, A1c= 6.7 ~  labs 4/11 showed BS= 162, A1c= 6.8... rMarland Kitchenc> better diet, take meds regularly. ~  labs 10/11 showed BS= 322, A1c= 8.3... PMarland KitchenMarland Kitchenrm confirms not taking meds regularly!!! Discussed w/ pt... ~  Labs 4/12 showed BS= 91, A1c= 7.9... RMarland KitchenMarland Kitchen> keep same meds, take regularly, better low carb diet... ~  Labs 8/12 on Metform2Bid+Glip10Bid showed BS= 114, A1c= 7.3... CMarland KitchenMarland Kitchentinue same... ~  Labs in hosp 1/13 showed BS~ 153-209 & A1c=7.4... NOTE: she has refused other meds due to $$$ ~  Labs 4/13 on Metform2Bid+Glipiz10Bid showed BS= 208, A1c= 8.6... She is referred to Endocrinology ~  She saw DrEllison but refused Actos, refused Gliptins, etc... ~  Labs  8/13 on Metform2Bid+Glipiz10Bid showed BS= 163, A1c= 7.5.Marland KitchenMarland Kitchen Continue same + better diet... ~  Labs 10/14 on Metform2Bid+Glipiz10Bid showed BS= 181, A1c= 8.8.Marland KitchenMarland Kitchen rec better diet & incr Glipiz10-2Bid... ~  Labs 4/15 on Metform500-2Bid & Glipizide10-2Bid showed BS= 181, A1c= 9.3; she refuses additional meds; she cut back both meds to just Qam due to "side effects"... ~  Labs 10/15 on Metform500-2Qam & Glipiz10-2Qam showed BS= 172, A1c= 8.4 and REC to add one of each at dinner in the PM...  ~  Labs 4/16 on Metform500-2Qam & Glipiz10-2Qam showed BS=175, A1c=9.4 (compliance is ?? She has been reluctant to follow my recs for increased meds) => refer to [facility] for DM management ~  [facility] 06/2016 w/ BS more easily conrolled in [facility] w/ diet & SSI; her A1c was recorded at 6.2, despite the fact that it was 11.7 several months earlier;  She was disch on Glipize10Bid & son reports that home BS checks all >300 since disch=> we will restart her Metform500Bid...  HIATAL HERNIA (ICD-553.3) - last EGD by DrStark was 3/03 and showed a HH, reflux...  COLONIC POLYPS (ICD-211.3) - last colonoscopy 3/03 by DrStark showed divertics and 47m polyp (no path avail).  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 pt reports that there is nothing they can do to help... 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  ~  she has had several UTI's... then perineal rash & saw GYN= neg PAP & Rx yeast infection- resolved. ~  She will f/u w/ Urology for persistant symptoms, seen by DrEskridge w/ trial Myrbetriq but not much benefit per pt... ~  7/15: referred to DrEskridge by Ortho after MRI spine showed ?bilat hydro; reviewed by Urology & they disagreed- no hydro but she has hx renal infarct 1/13, bilat extrarenal pelvis, & left peri-pelvic cysts, small bladder capacity, known cystocele...  DEGENERATIVE JOINT DISEASE (ICD-715.90) - she needs right TKR but wants to put this off as long as poss... ~  4/12:  Ortho eval by DrNorris, hx prev knee arthroscopies, given Cortisone shot, & he plans ?Synvisc series... ~  9/12:  outpt knee surg to repair torn cartilage per DrNorris; preop clearance by DrTaylor & Coumadin Clinic...  BACK PAIN, LUMBAR (ICD-724.2) - XRays showed scoliosis, facet degen arthritis, & osteopenia...  she has used ROBAXIN Prn... ~  4/12:  She reports eval from DrNorris w/ "arthritis all down my backbone" & he rec epid steroid injection, but she reports improved on NEURONTIN 1057mTid... ~  4/13:  She reports that back pain has improved but she still has some leg pain but notes that "it's tolerable"...  SHOULDER PAIN, RIGHT (ICD-719.41) - prev eval & Rx by DrRendall... s/p right shoulder surg 3/09... XRays showed calcium deposit, rotator cuff prob, & intol to Tramadol... ~  She tells me she wants to f/u w/ DrWhitfield for Ortho...  OSTEOPOROSIS (ICD-733.00) ~  labs 10/10 w/ Vit D level = 32... rec> start Vit D OTC 1000 u daily. ~  Labs 4/13 showed Vit D level = 30... rec to incr to 2000u daily...  MEMORY LOSS (ICD-780.93) - concern for her memory  expressed 7/09 OV- tried Aricept but she stopped it- "no different". TIA/ INFARCT - she was Hosp 11/11 by DrWillis w/ left sided weakness, ?left facial droop, & MRI showed 2 sm infarcts on right (frontal & ?  pontine);  MRA was neg;  CDopplers were neg;  Deficits all cleared rapidly;  2DEcho was abn- see above;  She was continued on her Coumadin Rx- no changes made... ~  4/13:  She was offered memory medications but she declined...  ANXIETY (ICD-300.00) - stress in family... 2 y/o granddau w/ seizures...  VITAMIN B12 DEFICIENCY >> on OTC Vit B-12 tabs orally> 1071mg/d... ~  Labs 7/11 showed Vit V12 level = 135... Started on Vit B12 supplement w/ 10051m/d... ~  Labs 4/13 showed Vit B12 level = 905... Ok top decr to 1/2 tab daily...   Past Surgical History:  Procedure Laterality Date  . cataract surgery/both eyes     01/2012 left eye---02/2012 right eye  . CHOLECYSTECTOMY    . LEFT HEART CATHETERIZATION WITH CORONARY ANGIOGRAM N/A 05/28/2012   Procedure: LEFT HEART CATHETERIZATION WITH CORONARY ANGIOGRAM;  Surgeon: MiSherren MochaMD;  [facility];  Service: Cardiovascular;  Laterality: N/A;  . LEFT HEART CATHETERIZATION WITH CORONARY ANGIOGRAM N/A 03/14/2013   Procedure: LEFT HEART CATHETERIZATION WITH CORONARY ANGIOGRAM;  Surgeon: HeSinclair GroomsMD;  [facility];  Service: Cardiovascular;  Laterality: N/A;  . PERCUTANEOUS CORONARY STENT INTERVENTION (PCI-S)  05/28/2012   Procedure: PERCUTANEOUS CORONARY STENT INTERVENTION (PCI-S);  Surgeon: MiSherren MochaMD;  [facility];  Service: Cardiovascular;;  . rt.leg surgery  11/2010    Outpatient Encounter Prescriptions as of 06/30/2016  Medication Sig  . acetaminophen (TYLENOL) 500 MG tablet Take 500 mg by mouth every 6 (six) hours as needed for mild pain.  . Marland Kitchentenolol (TENORMIN) 50 MG tablet TAKE ONE TABLET BY MOUTH ONCE DAILY  . atorvastatin (LIPITOR) 40 MG tablet TAKE ONE TABLET BY MOUTH ONCE DAILY AT 6PM  .  Blood Glucose Monitoring Suppl (ACCU-CHEK AVIVA PLUS) w/Device KIT Test blood sugar up to two times daily  . diltiazem (CARTIA XT) 120 MG 24 hr capsule Take 1 capsule (120 mg total) by mouth daily.  . famotidine (PEPCID) 20 MG tablet Take 1 tablet (20 mg total) by mouth at bedtime.  . feeding supplement, ENSURE ENLIVE, (ENSURE ENLIVE) LIQD Take 237 mLs by mouth 2 (two) times daily between meals.  . gabapentin (NEURONTIN) 100 MG capsule TAKE ONE CAPSULE BY MOUTH THREE TIMES DAILY  . glipiZIDE (GLUCOTROL) 10 MG tablet Take 1 tablet (10 mg total) by mouth 2 (two) times daily before a meal.  . glucose blood (ACCU-CHEK AVIVA PLUS) test strip Test blood sugar up to two times daily  . LANCETS ULTRA FINE MISC Test blood sugar up to two times daily  . metFORMIN (GLUCOPHAGE) 500 MG tablet Take 500 mg by mouth 2 (two) times daily with a meal.   . nitroGLYCERIN (NITROSTAT) 0.4 MG SL tablet Place 1 tablet (0.4 mg total) under the tongue every 5 (five) minutes as needed for chest pain (up to 3 doses).  . Marland Kitchenmeprazole (PRILOSEC) 20 MG capsule Take 1 tablet by mouth 30 minutes before breakfast  . vitamin B-12 (CYANOCOBALAMIN) 1000 MCG tablet Take 1,000 mcg by mouth every morning.   . warfarin (COUMADIN) 4 MG tablet Take 1 tablet (4 mg total) by mouth daily. (Patient taking differently: Current dose as of 6/21 is 1/2 tablet every evening)   No facility-administered encounter medications on file as of 06/30/2016.      Allergies  Allergen Reactions  . Peanut-Containing Drug Products Anaphylaxis and Swelling  . Azithromycin Other (See Comments)    REACTION: pt states INTOL to ZPak  .  Flomax [Tamsulosin Hcl] Swelling  . Pioglitazone Other (See Comments)    edema  . Pregabalin Other (See Comments)    REACTION: pt states INTOL to Lyrica  . Sulfonamide Derivatives Swelling    Immunization History  Administered Date(s) Administered  . Influenza Split 09/22/2010, 10/17/2011  . Influenza Whole 10/14/2008,  10/13/2009  . Influenza,inj,Quad PF,36+ Mos 10/18/2012, 10/23/2013, 10/24/2014, 10/26/2015  . Tdap 12/17/2010    Current Medications, Allergies, Past Medical History, Past Surgical History, Family History, and Social History were reviewed in [employer] record.   Review of Systems        See HPI - all other systems neg except as noted... The patient complains of weight loss, chest pain, dyspnea on exertion, and muscle weakness.  The patient denies anorexia, fever, weight gain, vision loss, decreased hearing, hoarseness, syncope, peripheral edema, prolonged cough, headaches, hemoptysis, abdominal pain, melena, hematochezia, severe indigestion/heartburn, hematuria, incontinence, suspicious skin lesions, transient blindness, difficulty walking, depression, unusual weight change, abnormal bleeding, enlarged lymph nodes, and angioedema.     Objective:   Physical Exam      WD, WN, 83 y/o WF in NAD...  GENERAL:  Alert & oriented; pleasant & cooperative... HEENT:  [place]/AT, EOM-wnl, PERRLA, EACs-clear, TMs-wnl, NOSE-clear, THROAT-clear & wnl. NECK:  Supple w/ fairROM; no JVD; normal carotid impulses w/o bruits; no thyromegaly or nodules palpated; no lymphadenopathy. CHEST:  Clear to P & A; without wheezes/ rales/ or rhonchi; +tender left 11th-12th ribs & costal margin. HEART:  Iregular Rhythm= AFib; Gr1-2 SEM without rubs or gallops heard... ABDOMEN:  Soft & nontender; normal bowel sounds; no organomegaly or masses detected. EXT: without deformities, mild arthritic changes; no varicose veins/ venous insuffic/ or edema. NEURO:  CN's intact;  no focal neuro deficits found... DERM:  No lesions noted; no rash etc...  RADIOLOGY DATA:  Reviewed in the EPIC EMR & discussed w/ the patient...  LABORATORY DATA:  Reviewed in the EPIC EMR & discussed w/ the patient...   Assessment & Plan:    10/26/15>   Brittanee continues to treat herself by taking the meds as she wants & not as  prescribed; she has also tied my hands as she will not take any of the newed (more expensive) DM meds and wil not agree to insulin rx; finally I have tried to refer her to endocrine/ DM specialist but she has repeatedly refused to go; we reviewed low carb/ DM diet & rec to take the meds as follows- Metform500- 2Qam & 1Qpm plus the Glipizide10-2Qam & 1Qpm as well... She has again declined my offer to send her to a DM specialist for their review. 03/10/16>    Ronnetta has serious multisystem dis- CARDIAC, Pulm, DM, GI/GU, etc; she is her own worst enemy as she won't take meds regularly, refuses additional consults eg-Endocruine/DM, refuses to change her meds or consider insulin shots,  Most recent Hosp for CP=> Abd pain showed abnormal 2DEcho & abn CT Abd as noted;  Radiology & [facility] hospitalists want [place] to order MRI abd w/ contrast to f/u on the pancreatic lesions & we will comply; Pt rec to take Prilosec & Pepcid. 04/25/16>   I stressed to Trang the importance of her specialty clinics including AFib clinic w/ consideration of Coumadin anticoag instead of xarelto which is unaffordable for her; also the Endocrine/ DM cilinic w/ DrEllison to effect better control of her DM 06/30/16>  Aishwarya has failed specialty clinic management of her issues (AFib w/ need for anticoag, & DM management);  we will endeavor to see her frequently here & manage her medical issues...   HBP>  Stable on BBlocker, CCB, Losar25; she is off the Lasix now... SHE DOES NOT KNOW HER MEDS, SHE IS NONCOMPLIANT & DID NOT BRING BOTTLES.  CAD>  Hosp 5/14 for NSTEMI=> stent to RCA; Hosp 3/15 w/ NSTEMI & cath showed widely patent coronaries & EF=50%; HOSP 2/18 w/ CP/ Abd pain & eval essent neg x for severe cardiac dis, valvular dis, chr AFib, pulmHTN...  AFIB>  She had abn 2DEcho in Hosp 11/11, 1/13, & 2/18- reviewed by Cards- she sees DrTaylor et al & on XARELTO15 due to epistaxis...  CHOL>  FLP looked fair on Lip40 but medication compliance is poor-  reminded to take med every day...  DM>  A1c is 10.6-11.7 now on Metform500-2Qam & Glipiz10 Bid & she refuses additional meds + she is NOT taking these meds everyday!  Asked to take all meds as prescribed (BID) regularly & once again asked to see ENDOCRINE for DM management.  RENAL INFARCTION>  Adm 1/13 w/ renal infarcts felt due to micro-emboli & inadeq INR ==> changed to XARELTO but she can't afford 7 won't take unless provided full samples.  URINARY INCONTINENCE>  Followed by Eli Hose now Eskridge & she feels that there is nothing they can do to help her; wears pads; offered second opinion & she will consider it...  ORTHO>  Followed by DrNorris/ [facility] & she is s/p cortisone shots in right knee & ?Synvisc series; she had arthroscopic surg for torn cartilage 9/12 she says & she doesn't want TKR.  STROKE>  She has had a CVA w/ hosp by Neuro 11/11 [facility] records all reviewed);  She was continued on her Coumadin per DrWillis & followed in [facility]=> changed to [place] 2013 as noted;  Stable w/o recurrent cerebral ischemic symptoms...  Other medical problems as noted... She will continue the Calcium, MVI, Vit D, & Vit B12 regimen...   Patient's Medications  New Prescriptions   No medications on file  Previous Medications   ACETAMINOPHEN (TYLENOL) 500 MG TABLET    Take 500 mg by mouth every 6 (six) hours as needed for mild pain.   ATENOLOL (TENORMIN) 50 MG TABLET    TAKE ONE TABLET BY MOUTH ONCE DAILY   ATORVASTATIN (LIPITOR) 40 MG TABLET    TAKE ONE TABLET BY MOUTH ONCE DAILY AT 6PM   BLOOD GLUCOSE MONITORING SUPPL (ACCU-CHEK AVIVA PLUS) W/DEVICE KIT    Test blood sugar up to two times daily   DILTIAZEM (CARTIA XT) 120 MG 24 HR CAPSULE    Take 1 capsule (120 mg total) by mouth daily.   FAMOTIDINE (PEPCID) 20 MG TABLET    Take 1 tablet (20 mg total) by mouth at bedtime.   FEEDING SUPPLEMENT, ENSURE ENLIVE, (ENSURE ENLIVE) LIQD    Take 237 mLs by mouth 2 (two) times daily between meals.    GABAPENTIN (NEURONTIN) 100 MG CAPSULE    TAKE ONE CAPSULE BY MOUTH THREE TIMES DAILY   GLIPIZIDE (GLUCOTROL) 10 MG TABLET    Take 1 tablet (10 mg total) by mouth 2 (two) times daily before a meal.   GLUCOSE BLOOD (ACCU-CHEK AVIVA PLUS) TEST STRIP    Test blood sugar up to two times daily   LANCETS ULTRA FINE MISC    Test blood sugar up to two times daily   METFORMIN (GLUCOPHAGE) 500 MG TABLET    Take 500 mg by mouth 2 (two) times daily with a  meal.    NITROGLYCERIN (NITROSTAT) 0.4 MG SL TABLET    Place 1 tablet (0.4 mg total) under the tongue every 5 (five) minutes as needed for chest pain (up to 3 doses).   OMEPRAZOLE (PRILOSEC) 20 MG CAPSULE    Take 1 tablet by mouth 30 minutes before breakfast   VITAMIN B-12 (CYANOCOBALAMIN) 1000 MCG TABLET    Take 1,000 mcg by mouth every morning.    WARFARIN (COUMADIN) 4 MG TABLET    Take 1 tablet (4 mg total) by mouth daily.  Modified Medications   No medications on file  Discontinued Medications   No medications on file

## 2016-07-06 ENCOUNTER — Telehealth: Payer: Self-pay | Admitting: Pulmonary Disease

## 2016-07-06 NOTE — Telephone Encounter (Signed)
Called and spoke with carrie---she is covering for Angie this week.  She stated that she did go and see the pt today and other labs were drawn today, so they did not do the finger stick for the PT/INR.  [facility]  Morey Hummingbird will write a precise order for her protimes to be checked every Monday via finger stick and call the results to SN.

## 2016-07-08 ENCOUNTER — Telehealth: Payer: Self-pay | Admitting: Pulmonary Disease

## 2016-07-08 DIAGNOSIS — I4821 Permanent atrial fibrillation: Secondary | ICD-10-CM

## 2016-07-08 NOTE — Telephone Encounter (Signed)
Phone note from today with results of PT/INR.  Will sign off of this message.

## 2016-07-08 NOTE — Telephone Encounter (Signed)
Called and spoke with Michelle Herrera who is covering [facility] this week.  She could not tell me the exact orders for the pt at this time but she will have the office manager review those and call us on Monday.  SN does not want the protime checked by [facility].   I called and spoke with Michelle Herrera--pts son and he is aware of SN recs to have the pt come to our lab every Monday for her protime check---this is so we can get these results back in a timely manner and dose the pt as needed.  He is aware of her new dosing schedule for her coumadin, but the pts son is at the beach at this time---so she will not be able to start this dosing until Monday.  He is aware that we will call him with the results---celll #  [phone].  Will hold this message until Monday.

## 2016-07-08 NOTE — Telephone Encounter (Signed)
Pt is currently taking the coumadin 5 mg   1/2 tablet daily.  SN please advise of any change in dosing for this pt.   Thanks

## 2016-07-08 NOTE — Telephone Encounter (Signed)
SN is aware of results.  Pt will need to take the following:  Coumadin 4 mg tablets 1 tablet on Monday, Wednesday and Friday 1/2 tablet on Tuesday, Thursday, Saturday and Sunday  Will need to be rechecked on Monday with fingerstick and call the results to SN at [phone]

## 2016-07-11 ENCOUNTER — Other Ambulatory Visit (INDEPENDENT_AMBULATORY_CARE_PROVIDER_SITE_OTHER): Payer: Medicare Other

## 2016-07-11 DIAGNOSIS — I4821 Permanent atrial fibrillation: Secondary | ICD-10-CM

## 2016-07-11 DIAGNOSIS — I482 Chronic atrial fibrillation: Secondary | ICD-10-CM | POA: Diagnosis not present

## 2016-07-11 LAB — PROTIME-INR
INR: 1.4 ratio — ABNORMAL HIGH (ref 0.8–1.0)
PROTHROMBIN TIME: 15.2 s — AB (ref 9.6–13.1)

## 2016-07-11 NOTE — Telephone Encounter (Signed)
Presently, labs have been collected but results are not yet finalized. Will route to SN/CLA for follow up-please advise.  Thanks!

## 2016-07-12 ENCOUNTER — Telehealth: Payer: Self-pay | Admitting: Pulmonary Disease

## 2016-07-12 NOTE — Telephone Encounter (Signed)
SN  Please Advise-  Benjamine Mola a [place] physical therapist, stated she went to visit the pt and the pt was complaining her feet has been hurting her so bad that she does not want to bare weight on them. The pt stated that the pain has been keeping her up at night. Benjamine Mola had stated pt's blood sugar level was checked and it was 133. She states pt does have a follow up appt with you on 07/14/16 but just wanted to make you aware of what is going on

## 2016-07-12 NOTE — Telephone Encounter (Signed)
lmtcb X1 for [place] at [facility].

## 2016-07-12 NOTE — Telephone Encounter (Signed)
Per SN: Noted, this will be discussed at 7/5 OV.

## 2016-07-12 NOTE — Telephone Encounter (Signed)
Return call on this pt.Michelle Herrera

## 2016-07-12 NOTE — Telephone Encounter (Signed)
lmom tcb x1 to [place] at [facility]

## 2016-07-12 NOTE — Telephone Encounter (Signed)
Per SN- Coumadin- take 1 tab on M,W,F Take 1/2 tab on T,TH,S,S ------ Spoke with pt's son Shanon Brow, aware of recs.  Nothing further needed.

## 2016-07-12 NOTE — Telephone Encounter (Signed)
Michelle Herrera [facility]) returned phone call..ert

## 2016-07-14 ENCOUNTER — Ambulatory Visit (INDEPENDENT_AMBULATORY_CARE_PROVIDER_SITE_OTHER): Payer: Medicare Other | Admitting: Pulmonary Disease

## 2016-07-14 ENCOUNTER — Encounter: Payer: Self-pay | Admitting: Pulmonary Disease

## 2016-07-14 VITALS — BP 110/70 | HR 60 | Temp 97.1°F | Ht 65.0 in | Wt 117.4 lb

## 2016-07-14 DIAGNOSIS — I482 Chronic atrial fibrillation: Secondary | ICD-10-CM | POA: Diagnosis not present

## 2016-07-14 DIAGNOSIS — N280 Ischemia and infarction of kidney: Secondary | ICD-10-CM | POA: Diagnosis not present

## 2016-07-14 DIAGNOSIS — R413 Other amnesia: Secondary | ICD-10-CM

## 2016-07-14 DIAGNOSIS — I10 Essential (primary) hypertension: Secondary | ICD-10-CM | POA: Diagnosis not present

## 2016-07-14 DIAGNOSIS — E0840 Diabetes mellitus due to underlying condition with diabetic neuropathy, unspecified: Secondary | ICD-10-CM

## 2016-07-14 DIAGNOSIS — I4821 Permanent atrial fibrillation: Secondary | ICD-10-CM

## 2016-07-14 NOTE — Progress Notes (Signed)
Subjective:    Patient ID: Michelle Herrera, female    DOB: Nov 11, 1930, 83 y.o.   MRN: [id number]  HPI 83 y/o WF here for a follow up visit...  SEE PREV EPIC NOTES FOR OLDER DATA >>      Adm 1/13 w/ renal infarcts felt due to micro-emboli & inadeq INR ==> changed to [place].  Hosp 5/14 for NSTEMI=> stent to RCA.  LABS 10/14:  FLP- looks good on Lip40;  Chems- ok x BS=181, A1c=8.8.Marland KitchenMarland Kitchen  Hosp 3/15 w/ NSTEMI & cath showed widely patent coronaries & EF=50%.  LABS 4/15:  Chems- ok x BS=181, A1c=9.3;  LFTs- wnl;  CBC- wnl;  Fe=121 (31%sat);    2DEcho 05/2013 showed mild LVH, EF=50-55%, mild AI, bileaflet MVP w/ mod MR, severe biatrial enlargement, PFO suspected, severe TR, PAsys=48mHg  ADDENDUM>> she notes that she has no appetite & "not eating"; she has lost 21# in 62moown to 109# & BMI=18; but serum proteins WNL; we will treat w/ MEGACE 20034mefore meals Tid... ~  October 23, 2013:  BetQueneshas decreased both of her DM meds in half as she says she doesn't tolerate the max doses due to "feeling bad, GI upset, & diarrhea";  She has DM w/ neuropathy> prev on Metform500-2Bid & Glipiz10-2Bid but INTOL she says & decreased to 2 of each Qam only; prev followed by DrEllison but last seen 6/13- Actos stopped due to edema; won't take additional meds due to cost; refuses insulin shots; she claims that BS higher when her knee is hurting; Labs 10/15 showed BS=172, A1c=8.4 & asked to ADD Metform500 & Glipiz10 (one of each) in PM at dinner => NOTE: she did NOT incr the Metform & glipizide as requested... ~  April 24, 2014:  she REFUSED referral to Endocrinology for DM mamagement, refuses to increase her DM meds per my recommendations, refuses to start insulin therapy; she says that she will continue current meds and get on diet + exercise to get her BS improved & A1c better... ~  October 24, 2014:  Reminded to take meds regularly & again asked to bring all med bottles to every office visit; she continues to  refuse to increase her DM meds or go to an endocrinologist for DM management; Her CC is the back pain but Tramadol50 makes her too groggy- rec to take 1/2 tab + Tylenol, up to Tid as needed...    ~  April 23, 2015:  36mo30mo [place] some dental work w/ a tooth pulled 03/19/15- developed some bleeding that wouldn't stop & went to ER (she is on Xarelto- held that one day); treated w/ gauze & a tea-bag w/ control...     She had f/u [facility]- DrTaylor 12/18/14> note reviewed- chr AFib, HBP, CAD s/p stent to RCA 2014, combined sys & diast CHF, valv dis w/ mod MR/ severe TR/ and mod PulmHTN (50mm39m no CP, chrDOE (class2); Same meds- on Xarelto, Aten50, Cardizem120, Lasix40, & no changes...     HBP> on Aten50, Diltiazem120 & Lasix20-2Qam; BP= 138/86 & she denies CP/ ch in SOB/ edema, notes occas palpit...    CAD, Cardiomyopathy (sys & diast CHF), Valvular Dis w/ MR etc> off ASA81 due to epistaxis, on Xarelto15 (lowered dose due to nose bleed) & above meds; Adm 5/14 by [facility] w/ NSTEMI, Cath=> stent to 95%RCA; followed by DrTaylor; Adm 1/15 & 3/15 w/ AFib, RVR, NSTEMI due to demand ischemia and recath w/ widely patent coronaries, EF=50%; meds adjusted...Marland KitchenMarland Kitchen  AFib, CHB & Pacer> on Xarelto at 15 (due to epistaxis); followed by DrTaylor & seen 12/16> cardiology notes are reviewed...    Chol> on Lip40; weight is up to 128#, BMI=21; FLP 4/16 shows TChol 214, TG 106, HDL 86, LDL 107 & reminded to take med every day!    DM w/ neuropathy> on Metform500Bid & Glipiz10-2Bid but ?what she is taking (didn't brink bottles); prev followed by DrEllison- Actos stopped due to edema; won't take additional meds due to cost; refuses insulin shots; she claims that BS higher when her knee is hurting; Labs 4/16 showed BS=175, A1c=9.4;  Labs 4/17 showed BS=268, A1c=10.6;  I confirmed w/ [facility] ([name]) that pt is NOT filling meds regularly (Metform= 04/04/15 for 30d supply & 01/20/15 prior to that;  Glipizide= 02/26/15 for 30d supply  & 12/11/14 prior to that);  We will again try to refer to Endocrine (she has refused mult times).    GI- HH, colon polyps, s/p GB> stable she says, not on regular PPI dosing, last colon was 2003 by DrStark w/ 56m polyp & divertics; denies abd pain, n/v, d/c, blood seen...    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     DJD, LBP, right shoulder pain> on Neurontin100Tid; Ortho evals from DrNorris/ [facility]- prev knee surg for torn cartilage, back discomfort treated w/ Tramadol & Tylenol...    Osteoporosis, Vit D defic> Vit D level 2013 = 30 and she was asked to incr her OTC Vit D supplement to 2000u daily; needs f/u BMD but she wants to wait...    Memory Loss> Hx 2 sm infarcts on prev MRI, on Xarelto from [facility]; she declines Aricept etc...    Anxiety> mod stress in the family, she declines anxiolytic meds...    B12 defic> on OTC oral B12 supplement 1000u daily...    Medication non-compliance>  This is well documented at each & every office visit... EXAM shows Afeb, VSS, O2sat=98% on RA;  HEENT- dental problems, mallampati1;  Chest- clear w/o w/r/r;  Heart- Irreg Afib, gr1-2/6 DEM w/o r/g heard;  Abd- soft, nontender, neg;  Ext- VI, w/o c/c/e;  Neuro- intact w/o focal neuro deficits...  LABS 04/23/15>  FLP- not at goals, not taking meds regularly;  Chems- ok x BS=268, A1c=10.6;  CBC- wnl;  Thyroid=3.51;  Reminded to take all meds every day! IMP/PLAN>>  Michelle Herrera still NOT taking her meds regularly- asked to fill all Rx every month & take all meds as prescribed every day!  We will attempt to get her to see DrGherghe at [facility] (she once again refused...   ~  October 26, 2015:  624moOV & Michelle Herrera reports that she is doing well, feeling good, & no new complaints or concerns;  She  denies CP, palpit, edema; states her breathing is OK & denies cough, sput, SOB; it is apparent that she is still NOT exercising at all... we reviewed the following medical problems during today's office visit >>     BP & cardiac controlled w/ Aten50, [id number], Xarelto15; BP=124/82 today & she remains asymptomatic...    Chol treated w/ diet + Lip40 but compliance is questioned; she did not bring pill bottles or med list to todays OV; FLScappoose/17 showed TChol 216, TG 115, HDL 80, LDL 113 & reminded to take  med every day...    DM treated w/ Metform + Glipizide but she doesn't take what is prescribed- admits to taking Metform500-2Qam & Glipiz10-2Qam but no PM doses; Labs today showed     GI/GU- stable, she wears depends & take Ditropan due to her incont but this system is working satis for her...    She has diffuse DJD, LBP, osteoporosis etc; on Neurontin, Tramadol, Tylenol prn... EXAM shows Afeb, VSS, O2sat=97% on RA;  HEENT- dental problems, mallampati1;  Chest- clear w/o w/r/r;  Heart- Irreg Afib, gr1-2/6 DEM w/o r/g heard;  Abd- soft, nontender, neg;  Ext- VI, w/o c/c/e;  Neuro- intact w/o focal neuro deficits...  LABS 10/26/15>  Chems- ok x BS=296, A1c=10.6 IMP/PLAN>  Michelle Herrera continues to treat herself by taking the meds as she wants & not as prescribed; she has also tied my hands as she will not take any of the newed (more expensive) DM meds and wil not agree to insulin rx; finally I have tried to refer her to endocrine/ DM specialist but she has repeatedly refused to go; we reviewed low carb/ DM diet & rec to take the meds as follows- Metform500- 2Qam & 1Qpm plus the Glipizide10-2Qam & 1Qpm as well... She has again declined my offer to send her to a DM specialist for their review... Note: >50% of this 25 min appt was spent in counseling & coordination of care.  ~  March 10, 2016:  4[facility]6moROV & [facility] w/ left CP radiating to the epigastrium & worse supine; ER eval  revealed EKG w/ Afib & NSSTTWA, CXR w/ cardiomeg & some hyperinflation w/ chr changes, LABS ok x BS=288 & troponins neg, UTI (?pyelo) w/ tntc WBCs=> EColi resist to Quinolones (Rx Rocephin=>Keflex);  CT Abd revealed mult findings (see below) + 2 lucent lesions in the pancreas up to 180msize & [facility] did not evaluate further, rather they disch pt to get the needed MRI Abd as an outpt after discharge...     Pt was last seen by [facility], DrTaylor 10/14/15>  HBP, chr AFib, CAD- s/p stent in RCA 2014;  Last 2DEcho in 2015 revealed numerous abnormalities (EF=50-55%, valvular heart dis, severe biatrial enlargement?PFO, severe TR & PAsys=4729m);  They did not rec any changes to meds & asked to f/u 46yr105yrNOTE: pt is on Xarelto15 per DrTaylor but she has never purchased this med- she only takes it if she get samples from the [facility] office    BP & cardiac treated w/ Aten50, [id number], Xarelto15, she is off the Lasix; BP=134/72 today & she notes the chest discomfort, SOB/DOE, denies edema...    Chol treated w/ diet + Lip40 but compliance is questioned; she did not bring pill bottles or med list to todays OV; FLP [place]7 showed TChol 216, TG 115, HDL 80, LDL 113 & reminded to take med every day...    DM treated w/ Metform1000Bid + Glipizide10-2Bid but she doesn't take what is prescribed- admits to taking Metform500-2Qam & Glipiz10-2Qam but no PM doses; she refuses Endocrine referral, refuses to change meds or take insulin shots; LABS 02/2016 showed BS=164, A1c=11.7 AND SHE AGAIN REFUSES ALL INTERVENTIONS on her behalf...    GI/GU- stable, she wears depends & take Ditropan due to her incont but this system is working satis for her...    She has diffuse DJD, LBP, osteoporosis etc; on Neurontin, Tramadol, Tylenol prn... EXAM shows Afeb, VSS, O2sat=99% on RA;  HEENT- dental problems, mallampati1;  Chest- clear w/o w/r/r;  Heart- Irreg Afib, gr1-2/6 SEM w/o r/g heard;  Abd- soft, nontender, neg;  Ext- VI, w/o c/c/e;  Neuro-  intact w/o focal neuro deficits...  CXR 02/29/16 (independently reviewed by me in the PACS system) showed stable cardiomeg, tortuous Ao, chronic lung changes- NAD.Marland KitchenMarland Kitchen  EKG 02/29/16 showed Afib, WNIO27-03, RAD, NSSTTWA...  CT Abd&Pelvis 03/01/16 showed cortical hypoattenuation of the right kidney, mult cortical scars bilaterally (likely prior ischemia or infection), punctate nonobstructing left renal stone; 2 lucent lesions in pancreas body & tail measuring up to 5m=> rec MRIw/ contrast; extensive divertics, severe aortic calcif, severe right ventric & atrial enlargement,  smal bilat inguinal & femoral hernias containing fat...  2DEcho 03/01/16> AFib, norm LV size w/ EF=55% & no RWMA, modMR, severeTR, severe biatrial enlargement, mod RV dilation w/ decr sys function & mod pulmHTN w/ PAsys=563mg...  LABS 02/2016>  Chems- ok x BS=164, A1c=11.7;  CBC- wnl x wbc=14.7;  UTI w/ EColi resist to Cipro, sens to keflex... IMP/PLAN>>  BeNyasiaas serious multisystem dis- CARDIAC, Pulm, DM, GI/GU, etc; she is her own worst enemy as she won't take meds regularly, refuses additional consults eg-Endocruine/DM, refuses to change her meds or consider insulin shots,  Most recent Hosp for CP=> Abd pain showed abnormal 2DEcho & abn CT Abd as noted;  Radiology & [facility] hospitalists want usKoreao order MRI abd w/ contrast to f/u on the pancreatic lesions & we will comply; Pt rec to take Prilosec & Pepcid...  ADDENDUM>>  MRI Abd, MRCP w/ contrast 03/18/16>>  Tiny benign appearing cystic lesions in the pancreas, largest=1240mize in the uncinate process; incidental 1cm left adrenal adenoma, no suspicious renal lesions, atherosclerosis of the Ao, no lymphadenopathy...  Pt informed that this looks benign... NOTE: Pt is sure her abd discomfort is coming from her DM meds => needs Endocrine consult for DM management...  ~  April 25, 2016:  6wk ROVNacos a no show for her DM appt w/ DrEllison- actually she showed up 3/23 for her 3/22  appt & when they offered to resched she said "forget it";  She was similarly a "no show" for her Afib clinic appt 3/6 & SHEEleanorWe gave a copy of her Med List to her son DavShanon [email or web address])...     Today she returns to the office by herself feeling fine- no complaints- didn't bring meds to the office, didn't bring list, "just what my son gives me" she says & this makes her care exceedingly difficult...    HBP, CAD, sys&diast CHF, valv hrt dis w/ MR, Afib, heart block=>pacer>  Followed by DrTaylor on XarEarnstine Regalhe is out "they won't give me no more"), Aten50, CardizemCD120, Losartan25; ?what she is taking- EKG today w/ Afib controlled VR w/ HR=85, NSSTTWA;  She needs f/u appt in AFib clinic...    Chol> supposed to be on Lip40; last FLP 4/17 showed TChol 216, TG 115, HDL 80, LDL 113; we reviewed low chol/ low fat diet & medication compiance...    DM> as noted she never saw DrEllison & has refused to take any other meds or insulin shots for me- on Metform500-2Bid & Glucotrol10Bid; last labs showed A1c=11.7 (02/29/16) and BS=340 (03/12/16); we will resched w/ Endocrine.    GI/GU- 50m36mstic lesion of pancreas on MRI; she wears depends & take Ditropan due to her incont but this system is working satis for her...    She has diffuse DJD, LBP, osteoporosis  etc; on Neurontin, Tramadol, Tylenol prn... EXAM shows Afeb, VSS, O2sat=99% on RA;  HEENT- dental problems, mallampati1;  Chest- clear w/o w/r/r;  Heart- Irreg Afib, gr1-2/6 SEM w/o r/g heard;  Abd- soft, nontender, neg;  Ext- VI, w/o c/c/e;  Neuro- intact w/o focal neuro deficits...  EKG 04/25/16 showed AFib, rate85, NSSTTWA, NAD...   LABS 04/25/16>  BS= 174 today in the office... IMP/PLAN>>   I stressed to Michelle Herrera the importance of her specialty clinics including AFib clinic w/ consideration of Coumadin anticoag instead of Xarelto which is unaffordable for her; also the Endocrine/ DM cilinic w/ DrEllison to  effect better control of her DM...   ~  June 30, 2016:  71moROV & post hosp check>  [place] - 06/22/16 by [facility] after presenting w/ abd discomfort w/ radiation to her back x several weeks; pain was sharp severe & persistent,  CT Abd was suspicious for embolic dis to the right kidney w/ renal infarction => as prev well documented, the patient was supposed to be on Xarelto20 from [place] her AFib but SHE REFUSED TO TAKE THE XARELTO UNLESS PROVIDED w/ SAMPLES FROM HIS OFFICE, she had been out of this med for ?wks ?months; they placed her on Heparin=> Coumadin [place] Cou[place] for u[place] manage for her... The other problem/ [place]- A1c was 11.7 in 2/18 & [place] & f/u A1c=6.2?!*  Since disch son says that sugars have been high >300 on their check=> we will re-start Metform5079mid... We reviewed the following medical problems during today's office visit >>     HBP> on Aten50, Diltiazem120 & off prev Losar & Lasix; BP= 110/72 & she denies CP/ ch in SOB/ edema, notes occas palpit...    CAD, Cardiomyopathy (sys & diast CHF), Valvular Dis w/ MR etc>     AFib, CHB & Pacer>  off ASA81 due to epistaxis, prev on Xarelto15 but compliance very poor; Adm 5/14 by [facility] w/ NSTEMI, Cath=> stent to 95%RCA; followed by DrTaylor; Adm 1/15 & 3/15 w/ AFib, RVR, NSTEMI due to demand ischemia and recath w/ widely patent coronaries, EF=50%; meds adjusted;  Adm 6/18 w/ abd pain from renal infarction=> started back on Coumadin & sent to usKoreao manage this!    He last saw DrTaylor 10/14/15 & she missed AFib clinic appt 03/15/16> chr AFib, HBP, CAD-s/p stent in RCA 2014; no angina, BP controlled, AFib w/ controlled VR; pt asked to f/u 1y14yr    Chol> on Lip40; weight is down to 114#, BMI=20; FLP 4/17 shows TChol 216, TG 115, HDL 79, LDL 113 & med compliance is suspect- reminded to take med every day!    DM w/ neuropathy> prev on Metform500Bid & Glipiz10-2Bid but ?what she is  taking (didn't brink bottles); prev followed by DrEllison- Actos stopped due to edema; won't take additional meds due to cost; refuses insulin shots; she claims that BS higher when her knee is hurting; Labs 4/16 showed BS=175, A1c=9.4;  Labs 4/17 showed BS=268, A1c=10.6; Labs 2/18 showed BS=288, A1c=11.7;  I confirmed w/ [facility] ([name]) on mult occas that she is NOT filling her 2 meds regularly!  We will again try to refer to Endocrine (she has refused mult times)=> she "no showed" for appt w/ DrEllison 03/31/16;  Post Hosp 6/18> BS was easily controlled in hosp & BS=136-219, A1c=6.2;  She was disch on Glipizide10Bid, off Metformin but son reports BS>300 at home &  we will re-start Metform500Bid...    GI- HH, colon polyps, s/p GB> stable she says, not on regular PPI dosing, but disch 6/18 on Pepcid20Qhs; last colon was 2003 by DrStark w/ 4m polyp & divertics; denies abd pain, n/v, d/c, blood seen...    Urinary incont & Hx renal infarction, ?bilat hydroneph on MRI spine > ?followed by DrEskridge (last note 2015)- hx renal infarct 1/13, bilat extrarenal pelvis, & left peri-pelvic cysts, small bladder capacity, known cystocele, trial of Myrtetriq/Detrol was unsuccessful; DrEskridge reviewed & no hydro seen; Hx AFib & supposed to be on Xarelto per DrTaylor, pt refuses to take this med unless given samples by [facility] & they ran out=> pt went un-anticoagulated & developed abd pain 06/2016=> Hosp w/ renal infarction & improved back on COUMADIN anticoag...    DJD, LBP, right shoulder pain> on Neurontin100Tid; Ortho evals from DrNorris/ [facility]- prev knee surg for torn cartilage, back discomfort treated w/ Tramadol & Tylenol...    Osteoporosis, Vit D defic> Vit D level 2013 = 30 and she was asked to incr her OTC Vit D supplement to 2000u daily; needs f/u BMD but she wants to wait...    Memory Loss, MCI, Dementia> Hx 2 sm infarcts on prev MRI, prev on Xarelto from [facility] but as noted compliance was poor; she  declines Aricept etc...    Anxiety> mod stress in the family, she declines anxiolytic meds...    B12 defic> on OTC oral B12 supplement 1000u daily; Epic labs showed B12=135 in 2011 and improved on recheck in 2013 to 905...    Medication non-compliance>  This is well documented at each & every office visit; Also requested to bring all med bottles to each visit & she NEVER does!... EXAM shows Afeb, VSS, O2sat=96% on RA;  HEENT- dental problems, mallampati1;  Chest- clear w/o w/r/r;  Heart- Irreg Afib, gr1-2/6 SEM w/o r/g heard;  Abd- soft, nontender, neg;  Ext- VI, w/o c/c/e;  Neuro- intact w/o focal neuro deficits...  Last 2DEcho 03/01/16> AFib, norm LV size w/ EF=55% & no RWMA, modMR, severeTR, severe biatrial enlargement, mod RV dilation w/ decr sys function & mod pulmHTN w/ PAsys=533mg...  Last EKG 04/25/16 showed AFib, rate 85, IVCD and NSSTTWA...  CT Abd/Pelvis 06/15/16>  Interval development of segmental areas of non-perfusion in both kidneys suggesting embolic dis; stable cystic lesion in the pancreas; Abd Ao atherosclerosis; left colon divertics...   LABS 06/2016 in Epic>  Chems- ok w/ BS=136-206, Cr=1.0-1.2;  CBC- ok x Hg=10.6-11.8 IMP/PLAN>>  BeRajviresents w/ a complex/ challenging management problem- her mental status & that of her caregiver son (SThompson Caulwill not allow them to supervise her meds=> this will have to fall on her son- DaShanon Browho works at [employer]. For now we will require Protimes in our office every Monday AM & we will call DaShanon Herrera/ changes in her med (so he can place them in her Week-sized pill case... For the DM we reviewed diet & rec that BeInez Catalinae-start Metform500 Bid + continue the Glipizide 1078md... We plan rov recheck in 2wks... Note:  >50% of this 70m53mv was devoted to counseling & coordination of care...    ~  July 14, 2016:  2wk ROV [place] she is well- feeling good & denies SOB, cough/ sputum, CP/ palpit, abd pain/ n/v/ etc...   [name]  1) She does c/o some  mild leg swelling & it is clear that she does NOT restrict salt/sodium in any way-- we discussed  the need to elim salt from diet/ elev legs/ wear support hose;  She is not on a diuretic yet but her BP= 110/70 on Aten50 + Cardizem120...    2) She also brought a note from son Michelle Herrera- asking if Gabapentin could cause weakness/ tiredness/ confusion because he says she was NOT taking this before the [place] it was started on disch from the [facility] (we had it on her list for a long time but she apparently wasn't taking it);  I inquired about neuropathy symptoms and she denies leg pain, burning, nocturnal discomfort etc "I sleep good all night" she says.Marland KitchenMarland Kitchen    3) Son Product/process development scientist) reports that he home BS was >300 after disch from [place] (on [place] alone);  He called [place] & we re-started her Metform500Bid;  He notes slowly improving BS readings betw 100-150 now on the 2 meds=> we discussed rechecking labs today...    4) Finally the Coumadin plan has been initiated- Michelle Herrera willl bring her to the [place] office lab every Monday for a PROTIME & we will call son Michelle Herrera w/ the result & rec for the coming week so he can adjust the pills in her weekly pill case EXAM shows Afeb, VSS, O2sat=97% on RA;  HEENT- dental problems, mallampati1;  Chest- clear w/o w/r/r;  Heart- Irreg Afib, gr1-2/6 SEM w/o r/g heard;  Abd- soft, nontender, neg;  Ext- VI, w/o c/c/e;  Neuro- intact w/o focal deficits.  LABS 07/11/16>  Protime=15.2/ INR=1.4 on Coumadin 13m tabs taking 1/2 tab daily => we directed son DShanon Browto incr to 1tab on MWF, and 1/2 tab on TThSS w/ recheck in 1wk...  LABS 07/14/16> pending IMP/PLAN>>  BKareen& son are instructed to elim salt/sodium from the diet, elev legs, wear support hose, and we are holding off on diuretics due to soft BP;  I also rec that she STOP the Gabapentin to see if her symptoms of weakness, tiredness, & memory get better esp since we are told she was NOT taking it prior to [place]ch[place] we thought she was (she NEVER  brought med bottles to the OVs);  Plus she says she does not have neuropathic symptoms- pain, burning, numbness, etc... BS checks at home are better on her combo of Metform500Bid + Glipiz10Bid & we are checking A1c again);  Finally we have adjusted her Coumadin based on her weekly Protimes via [facility] lab protocol... We plan ROV recheck in 116moNote: >50% of this 259mROV was spent in counseling & coordination of care...            Problem List:     HYPERTENSION >> VALVULAR HEART DISEASE >> ATRIAL FIBRILLATION - stable on Rx w/ XARELTO (but she would not fill rx, only used samples) & rate control strategy => shitched to Coumadin via [facility] protocol. ~  4/11:  she reports that she stopped her Lanoxin 0.125 on her own & doesn't want to restart. ~  8/11:  f/u DrTaylor & stable on Coumadin w/ rate control strategy, no changes made. ~  11/11:  Hosp by Neuro w/ TIA> 2DEcho showed mild LVH, diffuse HK w/ EF= 45%, thick pos MV leaflet w/ restrict motion & modMR, dilated LA&RA, severeTR & PAsys=49, can't r/o PFO... ~  8/12:  her BP=116/84 and she feels OK- denies HA, fatigue, visual changes, CP, palipit, dizziness, syncope, dyspnea, edema, etc... ~  12/12:  BP= 126/88 and she remains asymptomatic as above... ~  1/13:  2DEcho showed mild LVH w/ EF= 40-45% &  diffuse HK, Diastolic dysfunction, modMR, severe LAE, modTR, Pasys=45 ~  She was switched from Coumadin to [facility] 1/13 due to the renal infarcts & difficulty maintaining INR. ~  CXR 1/13 showed marked cardiomegaly, pulm vasc congestion, NAD... ~  4/13:  BP= 128/60 and she is feeling better she says;  BUN=15, Creat=0.8; She had f/u DrTaylor> noted to be doing well w/o symptoms- continue same meds & low Na. ~  EKG 4/13 showed AFib, rate 77, right ward axis & NSSTTWA... ~  8/13:  BP= 110/82 and she denies current CP, palpit, SOB, edema... ~  4/14: on Aten50Bid; BP= 130/84 & she denies CP/ SOB/ edema, notes occas palpit. ~  5/14:  Adm by Cards> NSTEMI, Cath  w/ LAD minor irreg only, LCirc w/ ostial 40-50%, mid RCA 95%, EF 55%. PCI: Vision BMS to the mid RCA. There was moderate MR on left ventriculogram;  ~  CXR 5/14 showed cardiomeg, underlying COPD w/ apical scarring, DJD in spine, NAD... ~  EKG 6/14 showed AFib, rate72, NSSTTWA...  ~  2DEcho 6/14 showed norm LV size & funct w/ EF=55%, mild MR, severe RA&LA dil, modTR,  ~  Adm 1/15 & 3/15 w/ CP, AFib w/ RVR, had NSTEMI 3/15 from demand ischemia; cath w/ patent coronaries, EF=50%; meds adjusted w/ stop ASA & decr Xarelto to 39m due to severe epistaxis... ~  CXR 3/15 showed cardiomegaly, hyperinflated lungs, biapical pleural thickening, NAD... ~  EKG 3/15 showed AFib, rate74, NSSTTWA... ~  4/15: on Aten50Bid, Diltiazem120, & Lasix20; also taking Xarelto15; BP= 110/64 & she denies CP/ SOB/ edema, notes occas palpit... ~  10/15:  on Aten50Bid, Diltiazem120,  & Lasix20-2Qam, Xarelto15; BP= 110/64 & she claims asymptomatic... ~  4/16: on Aten50, Cardizem120, Lasix20-2/d, Xarelto15; she reports doing well- no CP etc; BP= 110/70... ~  CXR 4/16 showed mod cardiomeg, hyperinflation, sl blunt angles c/w pleural thickening, DJD Tspine, osteopenia, NAD. ~  [facility]d pain believed due to renal infarction from AFib & not on anticoag;  She was heparinized 7 transitioned to Coumadin;  disch onCoumadin to [place] [facility]...  HYPERCHOLESTEROLEMIA - on diet + SIMVASTATIN 445md... ~  [place] TChol 160, TG 143, HDL 55, LDL 76 ~  FLP 7/09 showed TChol 165, TG 129, HDL 60, LDL 80 ~  FLP 10/10 showed TChol 175, TG 68, HDL 80, LDL 82 ~  FLP 4/11 showed TChol 207, TG 104, HDL 84, LDL 111... rec better diet, continue same med. ~  FLP 11/11 on Simva40 showed TChol 180, TG 71, HDL 67, LDL 99 ~  FLP 8/12 on Simva40 showed TChol 196, TG 96, HDL 89, LDL 88 ~  FLP 4/13 on Simva40 showed TChol 211, TG 77, HDL 85, LDL 112... rec better diet, same med. ~  FLP 10/14 on Lip40 showed TChol 138, TG 63,  HDL 65, LDL 61  ~  FLP 10/15 on Lip40 showed  TChol 127, TG 54, HDL 69, LDL 47 ~  FLP 4/16 on Lip40 showed TChol 214, TG 106, HDL 86, LDL 107   TYPE 2 DIABETES MELLITUS, Uncontrolled, w/ Neuropathy >>  ~  on METFORMIN 50072m2tabsBid, GLIPZIDE 65m62m (not taking Januvia due to $$)... ~  labs 1/09 showed BS= 148, HgA1c= 7.1... cMarland KitchenMarland Kitchentinue meds + better diet... ~  labs 7/09 showed BS= 168, HgA1c= 7.6... iMarland KitchenMarland Kitchenr Metform 2Bid, contin Glip 10Bid & Januv... ~  she stopped the Januvia due to cost... ~  labs 4/10 showed BS=  185, Aic= 8.0.Marland KitchenMarland Kitchen reminder to take meds regularly. ~  labs 10/10 on Metform2Bid+Glip10Bid showed BS= 124, A1c= 6.7 ~  labs 4/11 showed BS= 162, A1c= 6.8.[name]. rec> better diet, take meds regularly. ~  labs 10/11 showed BS= 322, A1c= 8.3.Marland KitchenMarland Kitchen Pharm confirms not taking meds regularly!!! Discussed w/ pt... ~  Labs 4/12 showed BS= 91, A1c= 7.9.Marland KitchenMarland Kitchen Rec> keep same meds, take regularly, better low carb diet... ~  Labs 8/12 on Metform2Bid+Glip10Bid showed BS= 114, A1c= 7.3.Marland KitchenMarland Kitchen Continue same... ~  Labs in hosp 1/13 showed BS~ 153-209 & A1c=7.4... NOTE: she has refused other meds due to $$$ ~  Labs 4/13 on Metform2Bid+Glipiz10Bid showed BS= 208, A1c= 8.6... She is referred to Endocrinology ~  She saw DrEllison but refused Actos, refused Gliptins, etc... ~  Labs 8/13 on Metform2Bid+Glipiz10Bid showed BS= 163, A1c= 7.5.Marland KitchenMarland Kitchen Continue same + better diet... ~  Labs 10/14 on Metform2Bid+Glipiz10Bid showed BS= 181, A1c= 8.8.Marland KitchenMarland Kitchen rec better diet & incr Glipiz10-2Bid... ~  Labs 4/15 on Metform500-2Bid & Glipizide10-2Bid showed BS= 181, A1c= 9.3; she refuses additional meds; she cut back both meds to just Qam due to "side effects"... ~  Labs 10/15 on Metform500-2Qam & Glipiz10-2Qam showed BS= 172, A1c= 8.4 and REC to add one of each at dinner in the PM...  ~  Labs 4/16 on Metform500-2Qam & Glipiz10-2Qam showed BS=175, A1c=9.4 (compliance is ?? She has been reluctant to follow my recs for increased meds) => refer to [facility]  Endocrine for DM management ~  [facility] 06/2016 w/ BS more easily conrolled in [facility] w/ diet & SSI; her A1c was recorded at 6.2, despite the fact that it was 11.7 several months earlier;  She was disch on Glipize10Bid & son reports that home BS checks all >300 since disch=> we will restart her Metform500Bid...  HIATAL HERNIA (ICD-553.3) - last EGD by DrStark was 3/03 and showed a HH, reflux...  COLONIC POLYPS (ICD-211.3) - last colonoscopy 3/03 by DrStark showed divertics and 73m polyp (no path avail).  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 pt reports that there is nothing they can do to help... 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  ~  she has had several UTI's... then perineal rash & saw GYN= neg PAP & Rx yeast infection- resolved. ~  She will f/u w/ Urology for persistant symptoms, seen by DrEskridge w/ trial Myrbetriq but not much benefit per pt... ~  7/15: referred to DrEskridge by Ortho after MRI spine showed ?bilat hydro; reviewed by Urology & they disagreed- no hydro but she has hx renal infarct 1/13, bilat extrarenal pelvis, & left peri-pelvic cysts, small bladder capacity, known cystocele...  DEGENERATIVE JOINT DISEASE (ICD-715.90) - she needs right TKR but wants to put this off as long as poss... ~  4/12:  Ortho eval by DrNorris, hx prev knee arthroscopies, given Cortisone shot, & he plans ?Synvisc series... ~  9/12:  outpt knee surg to repair torn cartilage per DrNorris; preop clearance by DrTaylor & Coumadin Clinic...  BACK PAIN, LUMBAR (ICD-724.2) - XRays showed scoliosis, facet degen arthritis, & osteopenia...  she has used ROBAXIN Prn... ~  4/12:  She reports eval from DrNorris w/ "arthritis all down my backbone" & he rec epid steroid injection, but she reports improved on NEURONTIN 1093m Tid... ~  4/13:  She reports that back pain has improved but she  still has some leg pain but notes that "it's tolerable"...  SHOULDER PAIN, RIGHT (ICD-719.41) - prev eval & Rx by DrRendall... s/p right shoulder surg 3/09... XRays showed calcium deposit, rotator cuff prob, & intol to Tramadol... ~  She tells me she wants to f/u w/ DrWhitfield for Ortho...  OSTEOPOROSIS (ICD-733.00) ~  labs 10/10 w/ Vit D level = 32... rec> start Vit D OTC 1000 u daily. ~  Labs 4/13 showed Vit D level = 30... rec to incr to 2000u daily...  MEMORY LOSS (ICD-780.93) - concern for her memory expressed 7/09 OV- tried Aricept but she stopped it- "no different". TIA/ INFARCT - she was Hosp 11/11 by DrWillis w/ left sided weakness, ?left facial droop, & MRI showed 2 sm infarcts on right (frontal & ?pontine);  MRA was neg;  CDopplers were neg;  Deficits all cleared rapidly;  2DEcho was abn- see above;  She was continued on her Coumadin Rx- no changes made... ~  4/13:  She was offered memory medications but she declined...  ANXIETY (ICD-300.00) - stress in family... 2 y/o granddau w/ seizures...  VITAMIN B12 DEFICIENCY >> on OTC Vit B-12 tabs orally> 1037mg/d... ~  Labs 7/11 showed Vit V12 level = 135... Started on Vit B12 supplement w/ 10067m/d... ~  Labs 4/13 showed Vit B12 level = 905... Ok top decr to 1/2 tab daily...   Past Surgical History:  Procedure Laterality Date  . cataract surgery/both eyes     01/2012 left eye---02/2012 right eye  . CHOLECYSTECTOMY    . LEFT HEART CATHETERIZATION WITH CORONARY ANGIOGRAM N/A 05/28/2012   Procedure: LEFT HEART CATHETERIZATION WITH CORONARY ANGIOGRAM;  Surgeon: MiSherren MochaMD;  [facility];  Service: Cardiovascular;  Laterality: N/A;  . LEFT HEART CATHETERIZATION WITH CORONARY ANGIOGRAM N/A 03/14/2013   Procedure: LEFT HEART CATHETERIZATION WITH CORONARY ANGIOGRAM;  Surgeon: HeSinclair GroomsMD;  [facility];  Service: Cardiovascular;  Laterality:  N/A;  . PERCUTANEOUS CORONARY STENT INTERVENTION (PCI-S)  05/28/2012   Procedure: PERCUTANEOUS CORONARY STENT INTERVENTION (PCI-S);  Surgeon: MiSherren MochaMD;  [facility];  Service: Cardiovascular;;  . rt.leg surgery  11/2010    Outpatient Encounter Prescriptions as of 07/14/2016  Medication Sig  . acetaminophen (TYLENOL) 500 MG tablet Take 500 mg by mouth every 6 (six) hours as needed for mild pain.  . Marland Kitchentenolol (TENORMIN) 50 MG tablet TAKE ONE TABLET BY MOUTH ONCE DAILY  . atorvastatin (LIPITOR) 40 MG tablet TAKE ONE TABLET BY MOUTH ONCE DAILY AT 6PM  . Blood Glucose Monitoring Suppl (ACCU-CHEK AVIVA PLUS) w/Device KIT Test blood sugar up to two times daily  . diltiazem (CARTIA XT) 120 MG 24 hr capsule Take 1 capsule (120 mg total) by mouth daily.  . famotidine (PEPCID) 20 MG tablet Take 1 tablet (20 mg total) by mouth at bedtime.  . feeding supplement, ENSURE ENLIVE, (ENSURE ENLIVE) LIQD Take 237 mLs by mouth 2 (two) times daily between meals.  . Marland KitchenlipiZIDE (GLUCOTROL) 10 MG tablet Take 1 tablet (10 mg total) by mouth 2 (two) times daily before a meal.  . glucose blood (ACCU-CHEK AVIVA PLUS) test strip Test blood sugar up to two times daily  . LANCETS ULTRA FINE MISC Test blood sugar up to two times daily  . metFORMIN (GLUCOPHAGE) 500 MG tablet Take 500 mg by mouth 2 (two) times daily with a meal.   . nitroGLYCERIN (NITROSTAT) 0.4 MG SL tablet Place 1 tablet (0.4 mg total) under the tongue  every 5 (five) minutes as needed for chest pain (up to 3 doses).  [name] omeprazole (PRILOSEC) 20 MG capsule Take 1 tablet by mouth 30 minutes before breakfast  . vitamin B-12 (CYANOCOBALAMIN) 1000 MCG tablet Take 1,000 mcg by mouth every morning.   . warfarin (COUMADIN) 4 MG tablet Take 1 tablet (4 mg total) by mouth daily. (Patient taking differently: Current dose as of 6/21 is 1/2 tablet every evening)  . [DISCONTINUED] gabapentin (NEURONTIN) 100 MG capsule TAKE ONE CAPSULE BY MOUTH THREE TIMES  DAILY (Patient not taking: Reported on 07/14/2016)   No facility-administered encounter medications on file as of 07/14/2016.      Allergies  Allergen Reactions  . Peanut-Containing Drug Products Anaphylaxis and Swelling  . Azithromycin Other (See Comments)    REACTION: pt states INTOL to ZPak  . Flomax [Tamsulosin Hcl] Swelling  . Pioglitazone Other (See Comments)    edema  . Pregabalin Other (See Comments)    REACTION: pt states INTOL to Lyrica  . Sulfonamide Derivatives Swelling    Immunization History  Administered Date(s) Administered  . Influenza Split 09/22/2010, 10/17/2011  . Influenza Whole 10/14/2008, 10/13/2009  . Influenza,inj,Quad PF,36+ Mos 10/18/2012, 10/23/2013, 10/24/2014, 10/26/2015  . Tdap 12/17/2010    Current Medications, Allergies, Past Medical History, Past Surgical History, Family History, and Social History were reviewed in [employer] record.   Review of Systems        See HPI - all other systems neg except as noted... The patient complains of weight loss, chest pain, dyspnea on exertion, and muscle weakness.  The patient denies anorexia, fever, weight gain, vision loss, decreased hearing, hoarseness, syncope, peripheral edema, prolonged cough, headaches, hemoptysis, abdominal pain, melena, hematochezia, severe indigestion/heartburn, hematuria, incontinence, suspicious skin lesions, transient blindness, difficulty walking, depression, unusual weight change, abnormal bleeding, enlarged lymph nodes, and angioedema.     Objective:   Physical Exam      WD, WN, 83 y/o WF in NAD...  GENERAL:  Alert & oriented; pleasant & cooperative... HEENT:  [place]/AT, EOM-wnl, PERRLA, EACs-clear, TMs-wnl, NOSE-clear, THROAT-clear & wnl. NECK:  Supple w/ fairROM; no JVD; normal carotid impulses w/o bruits; no thyromegaly or nodules palpated; no lymphadenopathy. CHEST:  Clear to P & A; without wheezes/ rales/ or rhonchi; +tender left 11th-12th ribs &  costal margin. HEART:  Iregular Rhythm= AFib; Gr1-2 SEM without rubs or gallops heard... ABDOMEN:  Soft & nontender; normal bowel sounds; no organomegaly or masses detected. EXT: without deformities, mild arthritic changes; no varicose veins/ venous insuffic/ or edema. NEURO:  CN's intact;  no focal neuro deficits found... DERM:  No lesions noted; no rash etc...  RADIOLOGY DATA:  Reviewed in the EPIC EMR & discussed w/ the patient...  LABORATORY DATA:  Reviewed in the EPIC EMR & discussed w/ the patient...   Assessment & Plan:    10/26/15>   Calinda continues to treat herself by taking the meds as she wants & not as prescribed; she has also tied my hands as she will not take any of the newed (more expensive) DM meds and wil not agree to insulin rx; finally I have tried to refer her to endocrine/ DM specialist but she has repeatedly refused to go; we reviewed low carb/ DM diet & rec to take the meds as follows- Metform500- 2Qam & 1Qpm plus the Glipizide10-2Qam & 1Qpm as well... She has again declined my offer to send her to a DM specialist for their review. 03/10/16>    Carlisha has serious  multisystem dis- CARDIAC, Pulm, DM, GI/GU, etc; she is her own worst enemy as she won't take meds regularly, refuses additional consults eg-Endocruine/DM, refuses to change her meds or consider insulin shots,  Most recent Hosp for CP=> Abd pain showed abnormal 2DEcho & abn CT Abd as noted;  Radiology & [facility] hospitalists want [place] to order MRI abd w/ contrast to f/u on the pancreatic lesions & we will comply; Pt rec to take Prilosec & Pepcid. 04/25/16>   I stressed to Imojean the importance of her specialty clinics including AFib clinic w/ consideration of Coumadin anticoag instead of xarelto which is unaffordable for her; also the Endocrine/ DM cilinic w/ DrEllison to effect better control of her DM 06/30/16>  Jaquisha has failed specialty clinic management of her issues (AFib w/ need for anticoag, & DM management); we will  endeavor to see her frequently here & manage her medical issues... 07/14/16>   Today we eliminated sodium, stopped Gabapentin, rechecked BS/ A1c, and reviewed Coumadin protocol w/ pt & Michelle Herrera...   HBP>  Stable on BBlocker, CCB, Losar25; she is off the Lasix now... SHE DOES NOT KNOW HER MEDS, SHE IS NONCOMPLIANT & DID NOT BRING BOTTLES.  CAD>  Hosp 5/14 for NSTEMI=> stent to RCA; Hosp 3/15 w/ NSTEMI & cath showed widely patent coronaries & EF=50%; HOSP 2/18 w/ CP/ Abd pain & eval essent neg x for severe cardiac dis, valvular dis, chr AFib, pulmHTN...  AFIB>  She had abn 2DEcho in Hosp 11/11, 1/13, & 2/18- reviewed by Cards- she sees DrTaylor et al & on XARELTO15 due to epistaxis...  CHOL>  FLP looked fair on Lip40 but medication compliance is poor- reminded to take med every day...  DM>  A1c is 10.6-11.7 now on Metform500-2Qam & Glipiz10 Bid & she refuses additional meds + she is NOT taking these meds everyday!  Asked to take all meds as prescribed (BID) regularly & once again asked to see ENDOCRINE for DM management.  RENAL INFARCTION>  Adm 1/13 w/ renal infarcts felt due to micro-emboli & inadeq INR ==> changed to XARELTO but she can't afford 7 won't take unless provided full samples.  URINARY INCONTINENCE>  Followed by Eli Hose now Eskridge & she feels that there is nothing they can do to help her; wears pads; offered second opinion & she will consider it...  ORTHO>  Followed by DrNorris/ [facility] & she is s/p cortisone shots in right knee & ?Synvisc series; she had arthroscopic surg for torn cartilage 9/12 she says & she doesn't want TKR.  STROKE>  She has had a CVA w/ hosp by Neuro 11/11 [facility] records all reviewed);  She was continued on her Coumadin per DrWillis & followed in [facility]=> changed to [place] 2013 as noted;  Stable w/o recurrent cerebral ischemic symptoms...  Other medical problems as noted... She will continue the Calcium, MVI, Vit D, & Vit B12 regimen...   Patient's  Medications  New Prescriptions   No medications on file  Previous Medications   ACETAMINOPHEN (TYLENOL) 500 MG TABLET    Take 500 mg by mouth every 6 (six) hours as needed for mild pain.   ATENOLOL (TENORMIN) 50 MG TABLET    TAKE ONE TABLET BY MOUTH ONCE DAILY   ATORVASTATIN (LIPITOR) 40 MG TABLET    TAKE ONE TABLET BY MOUTH ONCE DAILY AT 6PM   BLOOD GLUCOSE MONITORING SUPPL (ACCU-CHEK AVIVA PLUS) W/DEVICE KIT    Test blood sugar up to two times daily   DILTIAZEM (CARTIA XT)  120 MG 24 HR CAPSULE    Take 1 capsule (120 mg total) by mouth daily.   FAMOTIDINE (PEPCID) 20 MG TABLET    Take 1 tablet (20 mg total) by mouth at bedtime.   FEEDING SUPPLEMENT, ENSURE ENLIVE, (ENSURE ENLIVE) LIQD    Take 237 mLs by mouth 2 (two) times daily between meals.   GLIPIZIDE (GLUCOTROL) 10 MG TABLET    Take 1 tablet (10 mg total) by mouth 2 (two) times daily before a meal.   GLUCOSE BLOOD (ACCU-CHEK AVIVA PLUS) TEST STRIP    Test blood sugar up to two times daily   LANCETS ULTRA FINE MISC    Test blood sugar up to two times daily   METFORMIN (GLUCOPHAGE) 500 MG TABLET    Take 500 mg by mouth 2 (two) times daily with a meal.    NITROGLYCERIN (NITROSTAT) 0.4 MG SL TABLET    Place 1 tablet (0.4 mg total) under the tongue every 5 (five) minutes as needed for chest pain (up to 3 doses).   OMEPRAZOLE (PRILOSEC) 20 MG CAPSULE    Take 1 tablet by mouth 30 minutes before breakfast   VITAMIN B-12 (CYANOCOBALAMIN) 1000 MCG TABLET    Take 1,000 mcg by mouth every morning.    WARFARIN (COUMADIN) 4 MG TABLET    Take 1 tablet (4 mg total) by mouth daily.  Modified Medications   No medications on file  Discontinued Medications   GABAPENTIN (NEURONTIN) 100 MG CAPSULE    TAKE ONE CAPSULE BY MOUTH THREE TIMES DAILY

## 2016-07-14 NOTE — Patient Instructions (Signed)
Today we updated your med list in our EPIC system...     We decided to STOP the Gabapentin medication- and we removed it from the list...  Continue your COUMADIN 4mg  tabs the same for now--    Currently one whole tab on MWF, and 1/2 tab on T Th S S...  Our next PROTIME blood test is next Monday (07/19/16) and EVERY Monday AM...    We will check some additional labs on the 10th...    We will contact you w/ the results when available...   Call for any questions...  Let's plan a follow up visit in 59mo, sooner if needed for problems.Marland KitchenMarland Kitchen

## 2016-07-17 ENCOUNTER — Other Ambulatory Visit: Payer: Self-pay | Admitting: Pulmonary Disease

## 2016-07-18 ENCOUNTER — Other Ambulatory Visit (INDEPENDENT_AMBULATORY_CARE_PROVIDER_SITE_OTHER): Payer: Medicare Other

## 2016-07-18 DIAGNOSIS — I10 Essential (primary) hypertension: Secondary | ICD-10-CM | POA: Diagnosis not present

## 2016-07-18 DIAGNOSIS — I482 Chronic atrial fibrillation: Secondary | ICD-10-CM

## 2016-07-18 DIAGNOSIS — I4821 Permanent atrial fibrillation: Secondary | ICD-10-CM

## 2016-07-18 DIAGNOSIS — E0840 Diabetes mellitus due to underlying condition with diabetic neuropathy, unspecified: Secondary | ICD-10-CM

## 2016-07-18 LAB — COMPREHENSIVE METABOLIC PANEL
ALT: 10 U/L (ref 0–35)
AST: 14 U/L (ref 0–37)
Albumin: 4.3 g/dL (ref 3.5–5.2)
Alkaline Phosphatase: 66 U/L (ref 39–117)
BUN: 15 mg/dL (ref 6–23)
CO2: 30 meq/L (ref 19–32)
CREATININE: 0.89 mg/dL (ref 0.40–1.20)
Calcium: 9.9 mg/dL (ref 8.4–10.5)
Chloride: 103 mEq/L (ref 96–112)
GFR: 64.24 mL/min (ref >60.00)
Glucose, Bld: 129 mg/dL — ABNORMAL HIGH (ref 70–99)
Potassium: 4.1 mEq/L (ref 3.5–5.1)
SODIUM: 143 meq/L (ref 135–145)
Total Bilirubin: 1 mg/dL (ref 0.2–1.2)
Total Protein: 6.9 g/dL (ref 6.0–8.3)

## 2016-07-18 LAB — CBC WITH DIFFERENTIAL/PLATELET
BASOS PCT: 1.1 % (ref 0.0–3.0)
Basophils Absolute: 0.1 10*3/uL (ref 0.0–0.1)
EOS ABS: 0.2 10*3/uL (ref 0.0–0.7)
Eosinophils Relative: 3.8 % (ref 0.0–5.0)
HEMATOCRIT: 35.2 % — AB (ref 36.0–46.0)
Hemoglobin: 11.8 g/dL — ABNORMAL LOW (ref 12.0–15.0)
Lymphocytes Relative: 14.6 % (ref 12.0–46.0)
Lymphs Abs: 0.8 10*3/uL (ref 0.7–4.0)
MCHC: 33.6 g/dL (ref 30.0–36.0)
MCV: 96.8 fl (ref 78.0–100.0)
Monocytes Absolute: 0.5 10*3/uL (ref 0.1–1.0)
Monocytes Relative: 9.9 % (ref 3.0–12.0)
NEUTROS ABS: 3.7 10*3/uL (ref 1.4–7.7)
Neutrophils Relative %: 70.6 % (ref 43.0–77.0)
PLATELETS: 228 10*3/uL (ref 150.0–400.0)
RBC: 3.64 Mil/uL — ABNORMAL LOW (ref 3.87–5.11)
RDW: 15 % (ref 11.5–15.5)
WBC: 5.2 10*3/uL (ref 4.0–10.5)

## 2016-07-18 LAB — HEMOGLOBIN A1C: HEMOGLOBIN A1C: 7 % — AB (ref 4.6–6.5)

## 2016-07-18 LAB — TSH: TSH: 6.82 u[IU]/mL — ABNORMAL HIGH (ref 0.35–4.50)

## 2016-07-25 ENCOUNTER — Ambulatory Visit: Payer: Medicare Other | Admitting: Pulmonary Disease

## 2016-07-25 ENCOUNTER — Other Ambulatory Visit (INDEPENDENT_AMBULATORY_CARE_PROVIDER_SITE_OTHER): Payer: Medicare Other

## 2016-07-25 DIAGNOSIS — I4821 Permanent atrial fibrillation: Secondary | ICD-10-CM

## 2016-07-25 DIAGNOSIS — I482 Chronic atrial fibrillation: Secondary | ICD-10-CM | POA: Diagnosis not present

## 2016-07-25 LAB — PROTIME-INR
INR: 1.7 ratio — ABNORMAL HIGH (ref 0.8–1.0)
Prothrombin Time: 18.8 s — ABNORMAL HIGH (ref 9.6–13.1)

## 2016-08-01 ENCOUNTER — Other Ambulatory Visit (INDEPENDENT_AMBULATORY_CARE_PROVIDER_SITE_OTHER): Payer: Medicare Other

## 2016-08-01 DIAGNOSIS — I4821 Permanent atrial fibrillation: Secondary | ICD-10-CM

## 2016-08-01 DIAGNOSIS — I482 Chronic atrial fibrillation: Secondary | ICD-10-CM

## 2016-08-01 LAB — PROTIME-INR
INR: 2.7 ratio — AB (ref 0.8–1.0)
Prothrombin Time: 29.2 s — ABNORMAL HIGH (ref 9.6–13.1)

## 2016-08-08 ENCOUNTER — Other Ambulatory Visit (INDEPENDENT_AMBULATORY_CARE_PROVIDER_SITE_OTHER): Payer: Medicare Other

## 2016-08-08 DIAGNOSIS — I482 Chronic atrial fibrillation: Secondary | ICD-10-CM | POA: Diagnosis not present

## 2016-08-08 DIAGNOSIS — I4821 Permanent atrial fibrillation: Secondary | ICD-10-CM

## 2016-08-08 LAB — PROTIME-INR
INR: 3.1 ratio — ABNORMAL HIGH (ref 0.8–1.0)
Prothrombin Time: 33 s — ABNORMAL HIGH (ref 9.6–13.1)

## 2016-08-15 ENCOUNTER — Other Ambulatory Visit (INDEPENDENT_AMBULATORY_CARE_PROVIDER_SITE_OTHER): Payer: Medicare Other

## 2016-08-15 DIAGNOSIS — I482 Chronic atrial fibrillation: Secondary | ICD-10-CM

## 2016-08-15 LAB — PROTIME-INR
INR: 1.8 ratio — AB (ref 0.8–1.0)
Prothrombin Time: 19.3 s — ABNORMAL HIGH (ref 9.6–13.1)

## 2016-08-22 ENCOUNTER — Encounter: Payer: Self-pay | Admitting: Pulmonary Disease

## 2016-08-22 ENCOUNTER — Other Ambulatory Visit (INDEPENDENT_AMBULATORY_CARE_PROVIDER_SITE_OTHER): Payer: Medicare Other

## 2016-08-22 ENCOUNTER — Ambulatory Visit (INDEPENDENT_AMBULATORY_CARE_PROVIDER_SITE_OTHER): Payer: Medicare Other | Admitting: Pulmonary Disease

## 2016-08-22 VITALS — BP 106/62 | HR 83 | Temp 97.8°F | Ht 65.0 in | Wt 110.0 lb

## 2016-08-22 DIAGNOSIS — I482 Chronic atrial fibrillation: Secondary | ICD-10-CM | POA: Diagnosis not present

## 2016-08-22 DIAGNOSIS — R413 Other amnesia: Secondary | ICD-10-CM

## 2016-08-22 DIAGNOSIS — N280 Ischemia and infarction of kidney: Secondary | ICD-10-CM | POA: Diagnosis not present

## 2016-08-22 DIAGNOSIS — I4821 Permanent atrial fibrillation: Secondary | ICD-10-CM

## 2016-08-22 DIAGNOSIS — E0840 Diabetes mellitus due to underlying condition with diabetic neuropathy, unspecified: Secondary | ICD-10-CM

## 2016-08-22 DIAGNOSIS — I10 Essential (primary) hypertension: Secondary | ICD-10-CM | POA: Diagnosis not present

## 2016-08-22 DIAGNOSIS — R269 Unspecified abnormalities of gait and mobility: Secondary | ICD-10-CM | POA: Diagnosis not present

## 2016-08-22 LAB — PROTIME-INR
INR: 1.6 ratio — ABNORMAL HIGH (ref 0.8–1.0)
Prothrombin Time: 17.4 s — ABNORMAL HIGH (ref 9.6–13.1)

## 2016-08-22 NOTE — Progress Notes (Signed)
Subjective:    Patient ID: Michelle Herrera, female    DOB: Nov 11, 1930, 83 y.o.   MRN: [id number]  HPI 83 y/o WF here for a follow up visit...  SEE PREV EPIC NOTES FOR OLDER DATA >>      Adm 1/13 w/ renal infarcts felt due to micro-emboli & inadeq INR ==> changed to [place].  Hosp 5/14 for NSTEMI=> stent to RCA.  LABS 10/14:  FLP- looks good on Lip40;  Chems- ok x BS=181, A1c=8.8.Marland KitchenMarland Kitchen  Hosp 3/15 w/ NSTEMI & cath showed widely patent coronaries & EF=50%.  LABS 4/15:  Chems- ok x BS=181, A1c=9.3;  LFTs- wnl;  CBC- wnl;  Fe=121 (31%sat);    2DEcho 05/2013 showed mild LVH, EF=50-55%, mild AI, bileaflet MVP w/ mod MR, severe biatrial enlargement, PFO suspected, severe TR, PAsys=48mHg  ADDENDUM>> she notes that she has no appetite & "not eating"; she has lost 21# in 62moown to 109# & BMI=18; but serum proteins WNL; we will treat w/ MEGACE 20034mefore meals Tid... ~  October 23, 2013:  BetQueneshas decreased both of her DM meds in half as she says she doesn't tolerate the max doses due to "feeling bad, GI upset, & diarrhea";  She has DM w/ neuropathy> prev on Metform500-2Bid & Glipiz10-2Bid but INTOL she says & decreased to 2 of each Qam only; prev followed by DrEllison but last seen 6/13- Actos stopped due to edema; won't take additional meds due to cost; refuses insulin shots; she claims that BS higher when her knee is hurting; Labs 10/15 showed BS=172, A1c=8.4 & asked to ADD Metform500 & Glipiz10 (one of each) in PM at dinner => NOTE: she did NOT incr the Metform & glipizide as requested... ~  April 24, 2014:  she REFUSED referral to Endocrinology for DM mamagement, refuses to increase her DM meds per my recommendations, refuses to start insulin therapy; she says that she will continue current meds and get on diet + exercise to get her BS improved & A1c better... ~  October 24, 2014:  Reminded to take meds regularly & again asked to bring all med bottles to every office visit; she continues to  refuse to increase her DM meds or go to an endocrinologist for DM management; Her CC is the back pain but Tramadol50 makes her too groggy- rec to take 1/2 tab + Tylenol, up to Tid as needed...    ~  April 23, 2015:  36mo30mo [place] some dental work w/ a tooth pulled 03/19/15- developed some bleeding that wouldn't stop & went to ER (she is on Xarelto- held that one day); treated w/ gauze & a tea-bag w/ control...     She had f/u [facility]- DrTaylor 12/18/14> note reviewed- chr AFib, HBP, CAD s/p stent to RCA 2014, combined sys & diast CHF, valv dis w/ mod MR/ severe TR/ and mod PulmHTN (50mm39m no CP, chrDOE (class2); Same meds- on Xarelto, Aten50, Cardizem120, Lasix40, & no changes...     HBP> on Aten50, Diltiazem120 & Lasix20-2Qam; BP= 138/86 & she denies CP/ ch in SOB/ edema, notes occas palpit...    CAD, Cardiomyopathy (sys & diast CHF), Valvular Dis w/ MR etc> off ASA81 due to epistaxis, on Xarelto15 (lowered dose due to nose bleed) & above meds; Adm 5/14 by [facility] w/ NSTEMI, Cath=> stent to 95%RCA; followed by DrTaylor; Adm 1/15 & 3/15 w/ AFib, RVR, NSTEMI due to demand ischemia and recath w/ widely patent coronaries, EF=50%; meds adjusted...Marland KitchenMarland Kitchen  AFib, CHB & Pacer> on Xarelto at 15 (due to epistaxis); followed by DrTaylor & seen 12/16> cardiology notes are reviewed...    Chol> on Lip40; weight is up to 128#, BMI=21; FLP 4/16 shows TChol 214, TG 106, HDL 86, LDL 107 & reminded to take med every day!    DM w/ neuropathy> on Metform500Bid & Glipiz10-2Bid but ?what she is taking (didn't brink bottles); prev followed by DrEllison- Actos stopped due to edema; won't take additional meds due to cost; refuses insulin shots; she claims that BS higher when her knee is hurting; Labs 4/16 showed BS=175, A1c=9.4;  Labs 4/17 showed BS=268, A1c=10.6;  I confirmed w/ [facility] ([name]) that pt is NOT filling meds regularly (Metform= 04/04/15 for 30d supply & 01/20/15 prior to that;  Glipizide= 02/26/15 for 30d supply  & 12/11/14 prior to that);  We will again try to refer to Endocrine (she has refused mult times).    GI- HH, colon polyps, s/p GB> stable she says, not on regular PPI dosing, last colon was 2003 by DrStark w/ 56m polyp & divertics; denies abd pain, n/v, d/c, blood seen...    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     DJD, LBP, right shoulder pain> on Neurontin100Tid; Ortho evals from DrNorris/ [facility]- prev knee surg for torn cartilage, back discomfort treated w/ Tramadol & Tylenol...    Osteoporosis, Vit D defic> Vit D level 2013 = 30 and she was asked to incr her OTC Vit D supplement to 2000u daily; needs f/u BMD but she wants to wait...    Memory Loss> Hx 2 sm infarcts on prev MRI, on Xarelto from [facility]; she declines Aricept etc...    Anxiety> mod stress in the family, she declines anxiolytic meds...    B12 defic> on OTC oral B12 supplement 1000u daily...    Medication non-compliance>  This is well documented at each & every office visit... EXAM shows Afeb, VSS, O2sat=98% on RA;  HEENT- dental problems, mallampati1;  Chest- clear w/o w/r/r;  Heart- Irreg Afib, gr1-2/6 DEM w/o r/g heard;  Abd- soft, nontender, neg;  Ext- VI, w/o c/c/e;  Neuro- intact w/o focal neuro deficits...  LABS 04/23/15>  FLP- not at goals, not taking meds regularly;  Chems- ok x BS=268, A1c=10.6;  CBC- wnl;  Thyroid=3.51;  Reminded to take all meds every day! IMP/PLAN>>  BAlizzonis still NOT taking her meds regularly- asked to fill all Rx every month & take all meds as prescribed every day!  We will attempt to get her to see DrGherghe at [facility] (she once again refused...   ~  October 26, 2015:  624moOV & Lavere reports that she is doing well, feeling good, & no new complaints or concerns;  She  denies CP, palpit, edema; states her breathing is OK & denies cough, sput, SOB; it is apparent that she is still NOT exercising at all... we reviewed the following medical problems during today's office visit >>     BP & cardiac controlled w/ Aten50, [id number], Xarelto15; BP=124/82 today & she remains asymptomatic...    Chol treated w/ diet + Lip40 but compliance is questioned; she did not bring pill bottles or med list to todays OV; FLScappoose/17 showed TChol 216, TG 115, HDL 80, LDL 113 & reminded to take  med every day...    DM treated w/ Metform + Glipizide but she doesn't take what is prescribed- admits to taking Metform500-2Qam & Glipiz10-2Qam but no PM doses; Labs today showed     GI/GU- stable, she wears depends & take Ditropan due to her incont but this system is working satis for her...    She has diffuse DJD, LBP, osteoporosis etc; on Neurontin, Tramadol, Tylenol prn... EXAM shows Afeb, VSS, O2sat=97% on RA;  HEENT- dental problems, mallampati1;  Chest- clear w/o w/r/r;  Heart- Irreg Afib, gr1-2/6 DEM w/o r/g heard;  Abd- soft, nontender, neg;  Ext- VI, w/o c/c/e;  Neuro- intact w/o focal neuro deficits...  LABS 10/26/15>  Chems- ok x BS=296, A1c=10.6 IMP/PLAN>  Merisa continues to treat herself by taking the meds as she wants & not as prescribed; she has also tied my hands as she will not take any of the newed (more expensive) DM meds and wil not agree to insulin rx; finally I have tried to refer her to endocrine/ DM specialist but she has repeatedly refused to go; we reviewed low carb/ DM diet & rec to take the meds as follows- Metform500- 2Qam & 1Qpm plus the Glipizide10-2Qam & 1Qpm as well... She has again declined my offer to send her to a DM specialist for their review... Note: >50% of this 25 min appt was spent in counseling & coordination of care.  ~  March 10, 2016:  4-6moROV & [facility] by [facility] w/ left CP radiating to the epigastrium & worse supine; ER eval  revealed EKG w/ Afib & NSSTTWA, CXR w/ cardiomeg & some hyperinflation w/ chr changes, LABS ok x BS=288 & troponins neg, UTI (?pyelo) w/ tntc WBCs=> EColi resist to Quinolones (Rx Rocephin=>Keflex);  CT Abd revealed mult findings (see below) + 2 lucent lesions in the pancreas up to 180msize & [facility] did not evaluate further, rather they disch pt to get the needed MRI Abd as an outpt after discharge...     Pt was last seen by [facility], DrTaylor 10/14/15>  HBP, chr AFib, CAD- s/p stent in RCA 2014;  Last 2DEcho in 2015 revealed numerous abnormalities (EF=50-55%, valvular heart dis, severe biatrial enlargement?PFO, severe TR & PAsys=4729m);  They did not rec any changes to meds & asked to f/u 46yr105yrNOTE: pt is on Xarelto15 per DrTaylor but she has never purchased this med- she only takes it if she get samples from the [facility] office    BP & cardiac treated w/ Aten50, DiltCLEXNTZ001sar25, Xarelto15, she is off the Lasix; BP=134/72 today & she notes the chest discomfort, SOB/DOE, denies edema...    Chol treated w/ diet + Lip40 but compliance is questioned; she did not bring pill bottles or med list to todays OV; FLP [place]7 showed TChol 216, TG 115, HDL 80, LDL 113 & reminded to take med every day...    DM treated w/ Metform1000Bid + Glipizide10-2Bid but she doesn't take what is prescribed- admits to taking Metform500-2Qam & Glipiz10-2Qam but no PM doses; she refuses Endocrine referral, refuses to change meds or take insulin shots; LABS 02/2016 showed BS=164, A1c=11.7 AND SHE AGAIN REFUSES ALL INTERVENTIONS on her behalf...    GI/GU- stable, she wears depends & take Ditropan due to her incont but this system is working satis for her...    She has diffuse DJD, LBP, osteoporosis etc; on Neurontin, Tramadol, Tylenol prn... EXAM shows Afeb, VSS, O2sat=99% on RA;  HEENT- dental problems, mallampati1;  Chest- clear w/o w/r/r;  Heart- Irreg Afib, gr1-2/6 SEM w/o r/g heard;  Abd- soft, nontender, neg;  Ext- VI, w/o c/c/e;  Neuro-  intact w/o focal neuro deficits...  CXR 02/29/16 (independently reviewed by me in the PACS system) showed stable cardiomeg, tortuous Ao, chronic lung changes- NAD.Marland KitchenMarland Kitchen  EKG 02/29/16 showed Afib, WNIO27-03, RAD, NSSTTWA...  CT Abd&Pelvis 03/01/16 showed cortical hypoattenuation of the right kidney, mult cortical scars bilaterally (likely prior ischemia or infection), punctate nonobstructing left renal stone; 2 lucent lesions in pancreas body & tail measuring up to 5m=> rec MRIw/ contrast; extensive divertics, severe aortic calcif, severe right ventric & atrial enlargement,  smal bilat inguinal & femoral hernias containing fat...  2DEcho 03/01/16> AFib, norm LV size w/ EF=55% & no RWMA, modMR, severeTR, severe biatrial enlargement, mod RV dilation w/ decr sys function & mod pulmHTN w/ PAsys=563mg...  LABS 02/2016>  Chems- ok x BS=164, A1c=11.7;  CBC- wnl x wbc=14.7;  UTI w/ EColi resist to Cipro, sens to keflex... IMP/PLAN>>  BeNyasiaas serious multisystem dis- CARDIAC, Pulm, DM, GI/GU, etc; she is her own worst enemy as she won't take meds regularly, refuses additional consults eg-Endocruine/DM, refuses to change her meds or consider insulin shots,  Most recent Hosp for CP=> Abd pain showed abnormal 2DEcho & abn CT Abd as noted;  Radiology & [facility] hospitalists want usKoreao order MRI abd w/ contrast to f/u on the pancreatic lesions & we will comply; Pt rec to take Prilosec & Pepcid...  ADDENDUM>>  MRI Abd, MRCP w/ contrast 03/18/16>>  Tiny benign appearing cystic lesions in the pancreas, largest=1240mize in the uncinate process; incidental 1cm left adrenal adenoma, no suspicious renal lesions, atherosclerosis of the Ao, no lymphadenopathy...  Pt informed that this looks benign... NOTE: Pt is sure her abd discomfort is coming from her DM meds => needs Endocrine consult for DM management...  ~  April 25, 2016:  6wk ROVNacos a no show for her DM appt w/ DrEllison- actually she showed up 3/23 for her 3/22  appt & when they offered to resched she said "forget it";  She was similarly a "no show" for her Afib clinic appt 3/6 & SHEEleanorWe gave a copy of her Med List to her son DavShanon [email or web address])...     Today she returns to the office by herself feeling fine- no complaints- didn't bring meds to the office, didn't bring list, "just what my son gives me" she says & this makes her care exceedingly difficult...    HBP, CAD, sys&diast CHF, valv hrt dis w/ MR, Afib, heart block=>pacer>  Followed by DrTaylor on XarEarnstine Regalhe is out "they won't give me no more"), Aten50, CardizemCD120, Losartan25; ?what she is taking- EKG today w/ Afib controlled VR w/ HR=85, NSSTTWA;  She needs f/u appt in AFib clinic...    Chol> supposed to be on Lip40; last FLP 4/17 showed TChol 216, TG 115, HDL 80, LDL 113; we reviewed low chol/ low fat diet & medication compiance...    DM> as noted she never saw DrEllison & has refused to take any other meds or insulin shots for me- on Metform500-2Bid & Glucotrol10Bid; last labs showed A1c=11.7 (02/29/16) and BS=340 (03/12/16); we will resched w/ Endocrine.    GI/GU- 50m36mstic lesion of pancreas on MRI; she wears depends & take Ditropan due to her incont but this system is working satis for her...    She has diffuse DJD, LBP, osteoporosis  etc; on Neurontin, Tramadol, Tylenol prn... EXAM shows Afeb, VSS, O2sat=99% on RA;  HEENT- dental problems, mallampati1;  Chest- clear w/o w/r/r;  Heart- Irreg Afib, gr1-2/6 SEM w/o r/g heard;  Abd- soft, nontender, neg;  Ext- VI, w/o c/c/e;  Neuro- intact w/o focal neuro deficits...  EKG 04/25/16 showed AFib, rate85, NSSTTWA, NAD...   LABS 04/25/16>  BS= 174 today in the office... IMP/PLAN>>   I stressed to Luverta the importance of her specialty clinics including AFib clinic w/ consideration of Coumadin anticoag instead of Xarelto which is unaffordable for her; also the Endocrine/ DM cilinic w/ DrEllison to  effect better control of her DM...   ~  June 30, 2016:  71moROV & post hosp check>  [place]ann[place] - 06/22/16 by [facility] after presenting w/ abd discomfort w/ radiation to her back x several weeks; pain was sharp severe & persistent,  CT Abd was suspicious for embolic dis to the right kidney w/ renal infarction => as prev well documented, the patient was supposed to be on Xarelto20 from [place] her AFib but SHE REFUSED TO TAKE THE XARELTO UNLESS PROVIDED w/ SAMPLES FROM HIS OFFICE, she had been out of this med for ?wks ?months; they placed her on Heparin=> Coumadin [place] Cou[place] for u[place] manage for her... The other problem/ [place]- A1c was 11.7 in 2/18 & [place] & f/u A1c=6.2?!*  Since disch son says that sugars have been high >300 on their check=> we will re-start Metform5079mid... We reviewed the following medical problems during today's office visit >>     HBP> on Aten50, Diltiazem120 & off prev Losar & Lasix; BP= 110/72 & she denies CP/ ch in SOB/ edema, notes occas palpit...    CAD, Cardiomyopathy (sys & diast CHF), Valvular Dis w/ MR etc>     AFib, CHB & Pacer>  off ASA81 due to epistaxis, prev on Xarelto15 but compliance very poor; Adm 5/14 by [facility] w/ NSTEMI, Cath=> stent to 95%RCA; followed by DrTaylor; Adm 1/15 & 3/15 w/ AFib, RVR, NSTEMI due to demand ischemia and recath w/ widely patent coronaries, EF=50%; meds adjusted;  Adm 6/18 w/ abd pain from renal infarction=> started back on Coumadin & sent to usKoreao manage this!    He last saw DrTaylor 10/14/15 & she missed AFib clinic appt 03/15/16> chr AFib, HBP, CAD-s/p stent in RCA 2014; no angina, BP controlled, AFib w/ controlled VR; pt asked to f/u 1y14yr    Chol> on Lip40; weight is down to 114#, BMI=20; FLP 4/17 shows TChol 216, TG 115, HDL 79, LDL 113 & med compliance is suspect- reminded to take med every day!    DM w/ neuropathy> prev on Metform500Bid & Glipiz10-2Bid but ?what she is  taking (didn't brink bottles); prev followed by DrEllison- Actos stopped due to edema; won't take additional meds due to cost; refuses insulin shots; she claims that BS higher when her knee is hurting; Labs 4/16 showed BS=175, A1c=9.4;  Labs 4/17 showed BS=268, A1c=10.6; Labs 2/18 showed BS=288, A1c=11.7;  I confirmed w/ [facility] ([name]) on mult occas that she is NOT filling her 2 meds regularly!  We will again try to refer to Endocrine (she has refused mult times)=> she "no showed" for appt w/ DrEllison 03/31/16;  Post Hosp 6/18> BS was easily controlled in hosp & BS=136-219, A1c=6.2;  She was disch on Glipizide10Bid, off Metformin but son reports BS>300 at home &  we will re-start Metform500Bid...    GI- HH, colon polyps, s/p GB> stable she says, not on regular PPI dosing, but disch 6/18 on Pepcid20Qhs; last colon was 2003 by DrStark w/ 40m polyp & divertics; denies abd pain, n/v, d/c, blood seen...    Urinary incont & Hx renal infarction, ?bilat hydroneph on MRI spine > ?followed by DrEskridge (last note 2015)- hx renal infarct 1/13, bilat extrarenal pelvis, & left peri-pelvic cysts, small bladder capacity, known cystocele, trial of Myrtetriq/Detrol was unsuccessful; DrEskridge reviewed & no hydro seen; Hx AFib & supposed to be on Xarelto per DrTaylor, pt refuses to take this med unless given samples by [facility] & they ran out=> pt went un-anticoagulated & developed abd pain 06/2016=> Hosp w/ renal infarction & improved back on COUMADIN anticoag...    DJD, LBP, right shoulder pain> on Neurontin100Tid; Ortho evals from DrNorris/ [facility]- prev knee surg for torn cartilage, back discomfort treated w/ Tramadol & Tylenol...    Osteoporosis, Vit D defic> Vit D level 2013 = 30 and she was asked to incr her OTC Vit D supplement to 2000u daily; needs f/u BMD but she wants to wait...    Memory Loss, MCI, Dementia> Hx 2 sm infarcts on prev MRI, prev on Xarelto from [facility] but as noted compliance was poor; she  declines Aricept etc...    Anxiety> mod stress in the family, she declines anxiolytic meds...    B12 defic> on OTC oral B12 supplement 1000u daily; Epic labs showed B12=135 in 2011 and improved on recheck in 2013 to 905...    Medication non-compliance>  This is well documented at each & every office visit; Also requested to bring all med bottles to each visit & she NEVER does!... EXAM shows Afeb, VSS, O2sat=96% on RA;  HEENT- dental problems, mallampati1;  Chest- clear w/o w/r/r;  Heart- Irreg Afib, gr1-2/6 SEM w/o r/g heard;  Abd- soft, nontender, neg;  Ext- VI, w/o c/c/e;  Neuro- intact w/o focal neuro deficits...  Last 2DEcho 03/01/16> AFib, norm LV size w/ EF=55% & no RWMA, modMR, severeTR, severe biatrial enlargement, mod RV dilation w/ decr sys function & mod pulmHTN w/ PAsys=53mg...  Last EKG 04/25/16 showed AFib, rate 85, IVCD and NSSTTWA...  CT Abd/Pelvis 06/15/16>  Interval development of segmental areas of non-perfusion in both kidneys suggesting embolic dis; stable cystic lesion in the pancreas; Abd Ao atherosclerosis; left colon divertics...   LABS 06/2016 in Epic>  Chems- ok w/ BS=136-206, Cr=1.0-1.2;  CBC- ok x Hg=10.6-11.8 IMP/PLAN>>  BeGabriellresents w/ a complex/ challenging management problem- her mental status & that of her caregiver son (SThompson Caulwill not allow them to supervise her meds=> this will have to fall on her son- DaShanon Browho works at [employer]. For now we will require Protimes in our office every Monday AM & we will call DaShanon Brow/ changes in her med (so he can place them in her Week-sized pill case... For the DM we reviewed diet & rec that BeInez Catalinae-start Metform500 Bid + continue the Glipizide '10mg'$ Bid... We plan rov recheck in 2wks... Note:  >50% of this 6077mov was devoted to counseling & coordination of care...   ~  July 14, 2016:  2wk ROVLeakeyys she is well- feeling good & denies SOB, cough/ sputum, CP/ palpit, abd pain/ n/v/ etc...  [name]   1) She does c/o some mild  leg swelling & it is clear that she does NOT restrict salt/sodium in any way-- we discussed the  need to elim salt from diet/ elev legs/ wear support hose;  She is not on a diuretic yet but her BP= 110/70 on Aten50 + Cardizem120...    2) She also brought a note from son Shanon Brow- asking if Gabapentin could cause weakness/ tiredness/ confusion because he says she was NOT taking this before the [place] it was started on disch from the [place] (we had it on her list for a long time but she apparently wasn't taking it);  I inquired about neuropathy symptoms and she denies leg pain, burning, nocturnal discomfort etc "I sleep good all night" she says.Marland KitchenMarland Kitchen    3) Son Product/process development scientist) reports that he home BS was >300 after disch from [place] (on [place] alone);  He called [place] & we re-started her Metform500Bid;  He notes slowly improving BS readings betw 100-150 now on the 2 meds=> we discussed rechecking labs today...    4) Finally the Coumadin plan has been initiated- Shorty willl bring her to the [place] office lab every Monday for a PROTIME & we will call son Shanon Brow w/ the result & rec for the coming week so he can adjust the pills in her weekly pill case EXAM shows Afeb, VSS, O2sat=97% on RA;  HEENT- dental problems, mallampati1;  Chest- clear w/o w/r/r;  Heart- Irreg Afib, gr1-2/6 SEM w/o r/g heard;  Abd- soft, nontender, neg;  Ext- VI, w/o c/c/e;  Neuro- intact w/o focal deficits.  LABS 07/11/16>  Protime=15.2/ INR=1.4 on Coumadin '4mg'$  tabs taking 1/2 tab daily => we directed son Shanon Brow to incr to 1tab on MWF, and 1/2 tab on TThSS w/ recheck in 1wk...  LABS 07/18/16> Chems- ok w/ BS=129, A1c=7.0;  CBC- ok w/ Hg=11.8, mcv=97;  TSH=6.82 IMP/PLAN>>  Inez Catalina & son are instructed to elim salt/sodium from the diet, elev legs, wear support hose, and we are holding off on diuretics due to soft BP;  I also rec that she STOP the Gabapentin to see if her symptoms of weakness, tiredness, & memory get better esp since we are told she was NOT taking  it prior to [place] as we thought she was (she NEVER brought med bottles to the [facility]);  Plus she says she does not have neuropathic symptoms- pain, burning, numbness, etc... BS checks at home are better on her combo of Metform500Bid + Glipiz10Bid & we are checking A1c again);  Finally we have adjusted her Coumadin based on her weekly Protimes via [facility] lab protocol... We plan ROV recheck in 69mo Note: >50% of this 256m ROV was spent in counseling & coordination of care...    ~  August 22, 2016:  44m25moV & Lavaun's son shoThompson Cauls been bringing her into our lab every Monday morn for a Protime check- we call her son DavShanon Brow the result 7 rec for changing the Coumadin dose;  She is on COUMADIN '4mg'$  tabs ([facility] & currently dosed w/ 1/2 tab daily=> PT/INR today is 17.4/ 1.6 and we rec sl incr dose to 1 tab Q-Monday & 1/2 tab daily x6d (TWThFSS) w/ repeat protime next week...    BetBriports feeling OK 7 notes the Protime checks Q-Mon & medication control by son DavShanon Brow working for her...    HBP, CAD, cardiomyop, valv heart dis, AFib, CHB, pacer=> stable on Coumadin Aten50, Cardizem120; BP=110/62, pulse=83 & sl irreg, she denies CP/ palpit/ ch in SOB/ dizzy/ syncope/ & notes 1+LE edema (advised no salt)  Chol is controlled on Atorva40; last FLP was 4/17 & she cannot remember to come in FASTING for f/u FLP blood test...    DM w/ neuropathy> poor control but Ayame will only agree to take Metform500Bid & Glucotrol10Bid, she refuses more expensive next-step pills & any injectable meds!  She declined appt w/ DM/ Endocrine; we have documented poor compliance w/ meds prev but now admin by son Shanon Brow; note that BS is generally easy to control in [facility]- last [facility] 06/2016 w/ [id number], A1c=6.2>  Labs 7/18 here w/ BS=129, A1c=7.0.Marland KitchenMarland Kitchen    Urinary incont, hx renal infarction> she knows to take the coumadin as prescribed; renal function has been ok...    DJD, osteoporosis, VitD defic> she  has declined f/u BMD measurement, advised to take Advil prn, WomensMVI, VitD2000    Memory loss, dementia, abn MRI w/ 2 s, infarcts> she declines Aricept rx...  EXAM shows Afeb, VSS, O2sat=98% on RA;  HEENT- dental problems, mallampati1;  Chest- clear w/o w/r/r;  Heart- Irreg Afib, gr1-2/6 SEM w/o r/g heard;  Abd- soft, nontender, neg;  Ext- VI, w/o c/c/e;  Neuro- intact w/o focal deficits.  LABS 08/22/16>  PT=17.4, INR=1.6 on Coumadin'4mg'$  - 1/2 tab daily... IMP/PLAN>>  We decided to incr the Coumadin slightly by taking one tab ('4mg'$ ) on Mon & 1/2 tab the other 6d... She will continue w/ the weekly protimes for now & we are following; continue Med Monitoring via pt'a son Shanon Brow...            Problem List:     HYPERTENSION >> VALVULAR HEART DISEASE >> ATRIAL FIBRILLATION - stable on Rx w/ XARELTO (but she would not fill rx, only used samples) & rate control strategy => shitched to Coumadin via [facility] protocol. ~  4/11:  she reports that she stopped her Lanoxin 0.125 on her own & doesn't want to restart. ~  8/11:  f/u DrTaylor & stable on Coumadin w/ rate control strategy, no changes made. ~  11/11:  Hosp by Neuro w/ TIA> 2DEcho showed mild LVH, diffuse HK w/ EF= 45%, thick pos MV leaflet w/ restrict motion & modMR, dilated LA&RA, severeTR & PAsys=49, can't r/o PFO... ~  8/12:  her BP=116/84 and she feels OK- denies HA, fatigue, visual changes, CP, palipit, dizziness, syncope, dyspnea, edema, etc... ~  12/12:  BP= 126/88 and she remains asymptomatic as above... ~  1/13:  2DEcho showed mild LVH w/ EF= 40-45% & diffuse HK, Diastolic dysfunction, modMR, severe LAE, modTR, Pasys=45 ~  She was switched from Coumadin to [facility] 1/13 due to the renal infarcts & difficulty maintaining INR. ~  CXR 1/13 showed marked cardiomegaly, pulm vasc congestion, NAD... ~  4/13:  BP= 128/60 and she is feeling better she says;  BUN=15, Creat=0.8; She had f/u DrTaylor> noted to be doing well w/o symptoms- continue same meds  & low Na. ~  EKG 4/13 showed AFib, rate 77, right ward axis & NSSTTWA... ~  8/13:  BP= 110/82 and she denies current CP, palpit, SOB, edema... ~  4/14: on Aten50Bid; BP= 130/84 & she denies CP/ SOB/ edema, notes occas palpit. ~  5/14:  Adm by Cards> NSTEMI, Cath w/ LAD minor irreg only, LCirc w/ ostial 40-50%, mid RCA 95%, EF 55%. PCI: Vision BMS to the mid RCA. There was moderate MR on left ventriculogram;  ~  CXR 5/14 showed cardiomeg, underlying COPD w/ apical scarring, DJD in spine, NAD... ~  EKG 6/14 showed AFib, rate72, NSSTTWA...  ~  2DEcho 6/14  showed norm LV size & funct w/ EF=55%, mild MR, severe RA&LA dil, modTR,  ~  Adm 1/15 & 3/15 w/ CP, AFib w/ RVR, had NSTEMI 3/15 from demand ischemia; cath w/ patent coronaries, EF=50%; meds adjusted w/ stop ASA & decr Xarelto to '15mg'$  due to severe epistaxis... ~  CXR 3/15 showed cardiomegaly, hyperinflated lungs, biapical pleural thickening, NAD... ~  EKG 3/15 showed AFib, rate74, NSSTTWA... ~  4/15: on Aten50Bid, Diltiazem120, & Lasix20; also taking Xarelto15; BP= 110/64 & she denies CP/ SOB/ edema, notes occas palpit... ~  10/15:  on Aten50Bid, Diltiazem120,  & Lasix20-2Qam, Xarelto15; BP= 110/64 & she claims asymptomatic... ~  4/16: on Aten50, Cardizem120, Lasix20-2/d, Xarelto15; she reports doing well- no CP etc; BP= 110/70... ~  CXR 4/16 showed mod cardiomeg, hyperinflation, sl blunt angles c/w pleural thickening, DJD Tspine, osteopenia, NAD. ~  [facility] 06/2016 w/ abd pain believed due to renal infarction from AFib & not on anticoag;  She was heparinized 7 transitioned to Coumadin;  disch onCoumadin to be followed by [place]  in [facility]...  HYPERCHOLESTEROLEMIA - on diet + SIMVASTATIN '40mg'$ /d... ~  [place] 2007 showed TChol 160, TG 143, HDL 55, LDL 76 ~  FLP 7/09 showed TChol 165, TG 129, HDL 60, LDL 80 ~  FLP 10/10 showed TChol 175, TG 68, HDL 80, LDL 82 ~  FLP 4/11 showed TChol 207, TG 104, HDL 84, LDL 111... rec better diet, continue same med. ~  FLP  11/11 on Simva40 showed TChol 180, TG 71, HDL 67, LDL 99 ~  FLP 8/12 on Simva40 showed TChol 196, TG 96, HDL 89, LDL 88 ~  FLP 4/13 on Simva40 showed TChol 211, TG 77, HDL 85, LDL 112... rec better diet, same med. ~  FLP 10/14 on Lip40 showed TChol 138, TG 63, HDL 65, LDL 61  ~  FLP 10/15 on Lip40 showed  TChol 127, TG 54, HDL 69, LDL 47 ~  FLP 4/16 on Lip40 showed TChol 214, TG 106, HDL 86, LDL 107   TYPE 2 DIABETES MELLITUS, Uncontrolled, w/ Neuropathy >>  ~  on METFORMIN '500mg'$ - 2tabsBid, GLIPZIDE '10mg'$ Bid (not taking Januvia due to $$)... ~  labs 1/09 showed BS= 148, HgA1c= 7.1.Marland KitchenMarland Kitchen continue meds + better diet... ~  labs 7/09 showed BS= 168, HgA1c= 7.6.Marland KitchenMarland Kitchen incr Metform 2Bid, contin Glip 10Bid & Januv... ~  she stopped the Januvia due to cost... ~  labs 4/10 showed BS= 185, Aic= 8.0.Marland KitchenMarland Kitchen reminder to take meds regularly. ~  labs 10/10 on Metform2Bid+Glip10Bid showed BS= 124, A1c= 6.7 ~  labs 4/11 showed BS= 162, A1c= 6.8.[name]. rec> better diet, take meds regularly. ~  labs 10/11 showed BS= 322, A1c= 8.3.Marland KitchenMarland Kitchen Pharm confirms not taking meds regularly!!! Discussed w/ pt... ~  Labs 4/12 showed BS= 91, A1c= 7.9.Marland KitchenMarland Kitchen Rec> keep same meds, take regularly, better low carb diet... ~  Labs 8/12 on Metform2Bid+Glip10Bid showed BS= 114, A1c= 7.3.Marland KitchenMarland Kitchen Continue same... ~  Labs in hosp 1/13 showed BS~ 153-209 & A1c=7.4... NOTE: she has refused other meds due to $$$ ~  Labs 4/13 on Metform2Bid+Glipiz10Bid showed BS= 208, A1c= 8.6... She is referred to Endocrinology ~  She saw DrEllison but refused Actos, refused Gliptins, etc... ~  Labs 8/13 on Metform2Bid+Glipiz10Bid showed BS= 163, A1c= 7.5.Marland KitchenMarland Kitchen Continue same + better diet... ~  Labs 10/14 on Metform2Bid+Glipiz10Bid showed BS= 181, A1c= 8.8.Marland KitchenMarland Kitchen rec better diet & incr Glipiz10-2Bid... ~  Labs 4/15 on Metform500-2Bid & Glipizide10-2Bid showed BS= 181, A1c= 9.3; she refuses additional meds; she  cut back both meds to just Qam due to "side effects"... ~  Labs 10/15 on  Metform500-2Qam & Glipiz10-2Qam showed BS= 172, A1c= 8.4 and REC to add one of each at dinner in the PM...  ~  Labs 4/16 on Metform500-2Qam & Glipiz10-2Qam showed BS=175, A1c=9.4 (compliance is ?? She has been reluctant to follow my recs for increased meds) => refer to [facility] for DM management ~  [facility] 06/2016 w/ BS more easily conrolled in [facility] w/ diet & SSI; her A1c was recorded at 6.2, despite the fact that it was 11.7 several months earlier;  She was disch on Glipize10Bid & son reports that home BS checks all >300 since disch=> we will restart her Metform500Bid...  HIATAL HERNIA (ICD-553.3) - last EGD by DrStark was 3/03 and showed a HH, reflux...  COLONIC POLYPS (ICD-211.3) - last colonoscopy 3/03 by DrStark showed divertics and 20m polyp (no path avail).  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 pt reports that there is nothing they can do to help... 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  ~  she has had several UTI's... then perineal rash & saw GYN= neg PAP & Rx yeast infection- resolved. ~  She will f/u w/ Urology for persistant symptoms, seen by DrEskridge w/ trial Myrbetriq but not much benefit per pt... ~  7/15: referred to DrEskridge by Ortho after MRI spine showed ?bilat hydro; reviewed by Urology & they disagreed- no hydro but she has hx renal infarct 1/13, bilat extrarenal pelvis, & left peri-pelvic cysts, small bladder capacity, known cystocele...  DEGENERATIVE JOINT DISEASE (ICD-715.90) - she needs right TKR but wants to put this off as long as poss... ~  4/12:  Ortho eval by DrNorris, hx prev knee arthroscopies, given Cortisone shot, & he plans ?Synvisc series... ~  9/12:  outpt knee surg to repair torn cartilage per DrNorris; preop clearance by DrTaylor & Coumadin Clinic...  BACK  PAIN, LUMBAR (ICD-724.2) - XRays showed scoliosis, facet degen arthritis, & osteopenia...  she has used ROBAXIN Prn... ~  4/12:  She reports eval from DrNorris w/ "arthritis all down my backbone" & he rec epid steroid injection, but she reports improved on NEURONTIN '100mg'$  Tid... ~  4/13:  She reports that back pain has improved but she still has some leg pain but notes that "it's tolerable"...  SHOULDER PAIN, RIGHT (ICD-719.41) - prev eval & Rx by DrRendall... s/p right shoulder surg 3/09... XRays showed calcium deposit, rotator cuff prob, & intol to Tramadol... ~  She tells me she wants to f/u w/ DrWhitfield for Ortho...  OSTEOPOROSIS (ICD-733.00) ~  labs 10/10 w/ Vit D level = 32... rec> start Vit D OTC 1000 u daily. ~  Labs 4/13 showed Vit D level = 30... rec to incr to 2000u daily...  MEMORY LOSS (ICD-780.93) - concern for her memory expressed 7/09 OV- tried Aricept but she stopped it- "no different". TIA/ INFARCT - she was Hosp 11/11 by DrWillis w/ left sided weakness, ?left facial droop, & MRI showed 2 sm infarcts on right (frontal & ?pontine);  MRA was neg;  CDopplers were neg;  Deficits all cleared rapidly;  2DEcho was abn- see above;  She was continued on her Coumadin Rx- no changes made... ~  4/13:  She was offered memory medications but she declined...  ANXIETY (ICD-300.00) - stress  in family... 2 y/o granddau w/ seizures...  VITAMIN B12 DEFICIENCY >> on OTC Vit B-12 tabs orally> 1017mg/d... ~  Labs 7/11 showed Vit V12 level = 135... Started on Vit B12 supplement w/ 10036m/d... ~  Labs 4/13 showed Vit B12 level = 905... Ok top decr to 1/2 tab daily...   Past Surgical History:  Procedure Laterality Date  . cataract surgery/both eyes     01/2012 left eye---02/2012 right eye  . CHOLECYSTECTOMY    . LEFT HEART CATHETERIZATION WITH CORONARY ANGIOGRAM N/A 05/28/2012   Procedure: LEFT HEART CATHETERIZATION WITH CORONARY ANGIOGRAM;  Surgeon: MiSherren MochaMD;  [facility];   Service: Cardiovascular;  Laterality: N/A;  . LEFT HEART CATHETERIZATION WITH CORONARY ANGIOGRAM N/A 03/14/2013   Procedure: LEFT HEART CATHETERIZATION WITH CORONARY ANGIOGRAM;  Surgeon: HeSinclair GroomsMD;  [facility];  Service: Cardiovascular;  Laterality: N/A;  . PERCUTANEOUS CORONARY STENT INTERVENTION (PCI-S)  05/28/2012   Procedure: PERCUTANEOUS CORONARY STENT INTERVENTION (PCI-S);  Surgeon: MiSherren MochaMD;  [facility];  Service: Cardiovascular;;  . rt.leg surgery  11/2010    Outpatient Encounter Prescriptions as of 08/22/2016  Medication Sig  . acetaminophen (TYLENOL) 500 MG tablet Take 500 mg by mouth every 6 (six) hours as needed for mild pain.  . Marland Kitchentenolol (TENORMIN) 50 MG tablet TAKE ONE TABLET BY MOUTH ONCE DAILY  . atorvastatin (LIPITOR) 40 MG tablet TAKE ONE TABLET BY MOUTH ONCE DAILY AT 6PM  . Blood Glucose Monitoring Suppl (ACCU-CHEK AVIVA PLUS) w/Device KIT Test blood sugar up to two times daily  . diltiazem (CARTIA XT) 120 MG 24 hr capsule Take 1 capsule (120 mg total) by mouth daily.  . famotidine (PEPCID) 20 MG tablet Take 1 tablet (20 mg total) by mouth at bedtime.  . feeding supplement, ENSURE ENLIVE, (ENSURE ENLIVE) LIQD Take 237 mLs by mouth 2 (two) times daily between meals.  . Marland KitchenlipiZIDE (GLUCOTROL) 10 MG tablet Take 1 tablet (10 mg total) by mouth 2 (two) times daily.  . Marland Kitchenlucose blood (ACCU-CHEK AVIVA PLUS) test strip Test blood sugar up to two times daily  . LANCETS ULTRA FINE MISC Test blood sugar up to two times daily  . metFORMIN (GLUCOPHAGE) 500 MG tablet Take 500 mg by mouth 2 (two) times daily with a meal.   . nitroGLYCERIN (NITROSTAT) 0.4 MG SL tablet Place 1 tablet (0.4 mg total) under the tongue every 5 (five) minutes as needed for chest pain (up to 3 doses).  . Marland Kitchenmeprazole (PRILOSEC) 20 MG capsule Take 1 tablet by mouth 30 minutes before breakfast  . vitamin B-12 (CYANOCOBALAMIN) 1000 MCG tablet Take 1,000 mcg by mouth every morning.    . warfarin (COUMADIN) 4 MG tablet Take 1 tablet (4 mg total) by mouth daily. (Patient taking differently: Current dose as of 6/21 is 1/2 tablet every evening)  . [DISCONTINUED] glipiZIDE (GLUCOTROL) 10 MG tablet Take 1 tablet (10 mg total) by mouth 2 (two) times daily before a meal. (Patient not taking: Reported on 08/22/2016)   No facility-administered encounter medications on file as of 08/22/2016.      Allergies  Allergen Reactions  . Peanut-Containing Drug Products Anaphylaxis and Swelling  . Azithromycin Other (See Comments)    REACTION: pt states INTOL to ZPak  . Flomax [Tamsulosin Hcl] Swelling  . Pioglitazone Other (See Comments)    edema  . Pregabalin Other (See Comments)    REACTION: pt states INTOL to Lyrica  . Sulfonamide Derivatives Swelling  Immunization History  Administered Date(s) Administered  . Influenza Split 09/22/2010, 10/17/2011  . Influenza Whole 10/14/2008, 10/13/2009  . Influenza,inj,Quad PF,36+ Mos 10/18/2012, 10/23/2013, 10/24/2014, 10/26/2015  . Tdap 12/17/2010    Current Medications, Allergies, Past Medical History, Past Surgical History, Family History, and Social History were reviewed in [employer] record.   Review of Systems        See HPI - all other systems neg except as noted... The patient complains of weight loss, chest pain, dyspnea on exertion, and muscle weakness.  The patient denies anorexia, fever, weight gain, vision loss, decreased hearing, hoarseness, syncope, peripheral edema, prolonged cough, headaches, hemoptysis, abdominal pain, melena, hematochezia, severe indigestion/heartburn, hematuria, incontinence, suspicious skin lesions, transient blindness, difficulty walking, depression, unusual weight change, abnormal bleeding, enlarged lymph nodes, and angioedema.     Objective:   Physical Exam      WD, WN, 84 y/o WF in NAD...  GENERAL:  Alert & oriented; pleasant & cooperative... HEENT:  [place]/AT, EOM-wnl,  PERRLA, EACs-clear, TMs-wnl, NOSE-clear, THROAT-clear & wnl. NECK:  Supple w/ fairROM; no JVD; normal carotid impulses w/o bruits; no thyromegaly or nodules palpated; no lymphadenopathy. CHEST:  Clear to P & A; without wheezes/ rales/ or rhonchi; +tender left 11th-12th ribs & costal margin. HEART:  Iregular Rhythm= AFib; Gr1-2 SEM without rubs or gallops heard... ABDOMEN:  Soft & nontender; normal bowel sounds; no organomegaly or masses detected. EXT: without deformities, mild arthritic changes; no varicose veins/ venous insuffic/ or edema. NEURO:  CN's intact;  no focal neuro deficits found... DERM:  No lesions noted; no rash etc...  RADIOLOGY DATA:  Reviewed in the EPIC EMR & discussed w/ the patient...  LABORATORY DATA:  Reviewed in the EPIC EMR & discussed w/ the patient...   Assessment & Plan:    10/26/15>   Chequita continues to treat herself by taking the meds as she wants & not as prescribed; she has also tied my hands as she will not take any of the newed (more expensive) DM meds and wil not agree to insulin rx; finally I have tried to refer her to endocrine/ DM specialist but she has repeatedly refused to go; we reviewed low carb/ DM diet & rec to take the meds as follows- Metform500- 2Qam & 1Qpm plus the Glipizide10-2Qam & 1Qpm as well... She has again declined my offer to send her to a DM specialist for their review. 03/10/16>    Javana has serious multisystem dis- CARDIAC, Pulm, DM, GI/GU, etc; she is her own worst enemy as she won't take meds regularly, refuses additional consults eg-Endocruine/DM, refuses to change her meds or consider insulin shots,  Most recent Hosp for CP=> Abd pain showed abnormal 2DEcho & abn CT Abd as noted;  Radiology & [facility] hospitalists want [place] to order MRI abd w/ contrast to f/u on the pancreatic lesions & we will comply; Pt rec to take Prilosec & Pepcid. 04/25/16>   I stressed to Shaletha the importance of her specialty clinics including AFib clinic w/  consideration of Coumadin anticoag instead of xarelto which is unaffordable for her; also the Endocrine/ DM cilinic w/ DrEllison to effect better control of her DM 06/30/16>  Ellaree has failed specialty clinic management of her issues (AFib w/ need for anticoag, & DM management); we will endeavor to see her frequently here & manage her medical issues... 07/14/16>   Today we eliminated sodium, stopped Gabapentin, rechecked BS/ A1c, and reviewed Coumadin protocol w/ pt & Shorty... 08/22/16>   We  decided to incr the Coumadin slightly by taking one tab ('4mg'$ ) on Mon & 1/2 tab the other 6d... She will continue w/ the weekly protimes for now & we are following; continue Med Monitoring via pt'a son Shanon Brow...    HBP>  Stable on BBlocker, CCB, Losar25; she is off the Lasix now... SHE DOES NOT KNOW HER MEDS, SHE IS NONCOMPLIANT & DID NOT BRING BOTTLES.  CAD>  Hosp 5/14 for NSTEMI=> stent to RCA; Hosp 3/15 w/ NSTEMI & cath showed widely patent coronaries & EF=50%; HOSP 2/18 w/ CP/ Abd pain & eval essent neg x for severe cardiac dis, valvular dis, chr AFib, pulmHTN...  AFIB>  She had abn 2DEcho in Hosp 11/11, 1/13, & 2/18- reviewed by Cards- she sees DrTaylor et al & on XARELTO15 due to epistaxis...  CHOL>  FLP looked fair on Lip40 but medication compliance is poor- reminded to take med every day...  DM>  A1c is 10.6-11.7 now on Metform500-2Qam & Glipiz10 Bid & she refuses additional meds + she is NOT taking these meds everyday!  Asked to take all meds as prescribed (BID) regularly & once again asked to see ENDOCRINE for DM management.  RENAL INFARCTION>  Adm 1/13 w/ renal infarcts felt due to micro-emboli & inadeq INR ==> changed to XARELTO but she can't afford 7 won't take unless provided full samples.  URINARY INCONTINENCE>  Followed by Eli Hose now Eskridge & she feels that there is nothing they can do to help her; wears pads; offered second opinion & she will consider it...  ORTHO>  Followed by DrNorris/  [facility] & she is s/p cortisone shots in right knee & ?Synvisc series; she had arthroscopic surg for torn cartilage 9/12 she says & she doesn't want TKR.  STROKE>  She has had a CVA w/ hosp by Neuro 11/11 [facility] records all reviewed);  She was continued on her Coumadin per DrWillis & followed in [facility]=> changed to [place] 2013 as noted;  Stable w/o recurrent cerebral ischemic symptoms...  Other medical problems as noted... She will continue the Calcium, MVI, Vit D, & Vit B12 regimen...   Patient's Medications  New Prescriptions   No medications on file  Previous Medications   ACETAMINOPHEN (TYLENOL) 500 MG TABLET    Take 500 mg by mouth every 6 (six) hours as needed for mild pain.   ATENOLOL (TENORMIN) 50 MG TABLET    TAKE ONE TABLET BY MOUTH ONCE DAILY   ATORVASTATIN (LIPITOR) 40 MG TABLET    TAKE ONE TABLET BY MOUTH ONCE DAILY AT 6PM   BLOOD GLUCOSE MONITORING SUPPL (ACCU-CHEK AVIVA PLUS) W/DEVICE KIT    Test blood sugar up to two times daily   DILTIAZEM (CARTIA XT) 120 MG 24 HR CAPSULE    Take 1 capsule (120 mg total) by mouth daily.   FAMOTIDINE (PEPCID) 20 MG TABLET    Take 1 tablet (20 mg total) by mouth at bedtime.   FEEDING SUPPLEMENT, ENSURE ENLIVE, (ENSURE ENLIVE) LIQD    Take 237 mLs by mouth 2 (two) times daily between meals.   GLIPIZIDE (GLUCOTROL) 10 MG TABLET    Take 1 tablet (10 mg total) by mouth 2 (two) times daily.   GLUCOSE BLOOD (ACCU-CHEK AVIVA PLUS) TEST STRIP    Test blood sugar up to two times daily   LANCETS ULTRA FINE MISC    Test blood sugar up to two times daily   METFORMIN (GLUCOPHAGE) 500 MG TABLET    Take 500 mg by mouth 2 (  two) times daily with a meal.    NITROGLYCERIN (NITROSTAT) 0.4 MG SL TABLET    Place 1 tablet (0.4 mg total) under the tongue every 5 (five) minutes as needed for chest pain (up to 3 doses).   OMEPRAZOLE (PRILOSEC) 20 MG CAPSULE    Take 1 tablet by mouth 30 minutes before breakfast   VITAMIN B-12 (CYANOCOBALAMIN) 1000 MCG TABLET    Take  1,000 mcg by mouth every morning.    WARFARIN (COUMADIN) 4 MG TABLET    Take 1 tablet (4 mg total) by mouth daily.  Modified Medications   No medications on file  Discontinued Medications   GLIPIZIDE (GLUCOTROL) 10 MG TABLET    Take 1 tablet (10 mg total) by mouth 2 (two) times daily before a meal.

## 2016-08-22 NOTE — Patient Instructions (Signed)
Today we updated your med list in our EPIC system...    Continue your current medications the same...  We decided to adjust the Coumadin 4mg  tabs -     Take one whole tab on Monday eve & 1/2 tab the other 6 days...  Recheck the Protime blood test in 1 week...  Call for any questions.Marland KitchenMarland Kitchen

## 2016-08-29 ENCOUNTER — Other Ambulatory Visit (INDEPENDENT_AMBULATORY_CARE_PROVIDER_SITE_OTHER): Payer: Medicare Other

## 2016-08-29 DIAGNOSIS — I482 Chronic atrial fibrillation: Secondary | ICD-10-CM

## 2016-08-29 DIAGNOSIS — I4821 Permanent atrial fibrillation: Secondary | ICD-10-CM

## 2016-08-29 LAB — PROTIME-INR
INR: 1.4 ratio — ABNORMAL HIGH (ref 0.8–1.0)
Prothrombin Time: 15.2 s — ABNORMAL HIGH (ref 9.6–13.1)

## 2016-08-31 LAB — HM DIABETES EYE EXAM

## 2016-09-05 ENCOUNTER — Other Ambulatory Visit: Payer: Medicare Other

## 2016-09-05 ENCOUNTER — Other Ambulatory Visit (INDEPENDENT_AMBULATORY_CARE_PROVIDER_SITE_OTHER): Payer: Medicare Other

## 2016-09-05 DIAGNOSIS — I482 Chronic atrial fibrillation: Secondary | ICD-10-CM | POA: Diagnosis not present

## 2016-09-05 DIAGNOSIS — I4821 Permanent atrial fibrillation: Secondary | ICD-10-CM

## 2016-09-05 LAB — PROTIME-INR
INR: 1.5 ratio — ABNORMAL HIGH (ref 0.8–1.0)
PROTHROMBIN TIME: 15.7 s — AB (ref 9.6–13.1)

## 2016-09-19 ENCOUNTER — Other Ambulatory Visit (INDEPENDENT_AMBULATORY_CARE_PROVIDER_SITE_OTHER): Payer: Medicare Other

## 2016-09-19 DIAGNOSIS — I482 Chronic atrial fibrillation: Secondary | ICD-10-CM | POA: Diagnosis not present

## 2016-09-19 DIAGNOSIS — I4821 Permanent atrial fibrillation: Secondary | ICD-10-CM

## 2016-09-19 LAB — PROTIME-INR
INR: 1.4 ratio — AB (ref 0.8–1.0)
PROTHROMBIN TIME: 15 s — AB (ref 9.6–13.1)

## 2016-09-28 ENCOUNTER — Telehealth: Payer: Self-pay | Admitting: Pulmonary Disease

## 2016-09-28 NOTE — Telephone Encounter (Signed)
Spoke with patient's son. He stated that she has had a stuffy head with a runny nose for the past few days. Denied any coughing or SOB spells. Stated that the patient did feel warm across her forehead. No body aches.   Pt wishes to use [facility] on [name].  SN, please advise. Thanks!

## 2016-09-28 NOTE — Telephone Encounter (Signed)
VS please advise, as SN is unavailable. Thanks.

## 2016-09-28 NOTE — Telephone Encounter (Signed)
Spoke with Shanon Brow and notified of recs per VS  He verbalized understanding and will inform the pt  Will call if not improving

## 2016-09-28 NOTE — Telephone Encounter (Signed)
Have her try flonase 1 spray each nostril daily.

## 2016-10-03 ENCOUNTER — Other Ambulatory Visit (INDEPENDENT_AMBULATORY_CARE_PROVIDER_SITE_OTHER): Payer: Medicare Other

## 2016-10-03 DIAGNOSIS — I482 Chronic atrial fibrillation: Secondary | ICD-10-CM

## 2016-10-03 DIAGNOSIS — I4821 Permanent atrial fibrillation: Secondary | ICD-10-CM

## 2016-10-03 LAB — PROTIME-INR
INR: 1.8 ratio — AB (ref 0.8–1.0)
Prothrombin Time: 19 s — ABNORMAL HIGH (ref 9.6–13.1)

## 2016-10-17 ENCOUNTER — Other Ambulatory Visit (INDEPENDENT_AMBULATORY_CARE_PROVIDER_SITE_OTHER): Payer: Medicare Other

## 2016-10-17 DIAGNOSIS — I4821 Permanent atrial fibrillation: Secondary | ICD-10-CM

## 2016-10-17 DIAGNOSIS — I482 Chronic atrial fibrillation: Secondary | ICD-10-CM

## 2016-10-17 LAB — PROTIME-INR
INR: 1.9 ratio — AB (ref 0.8–1.0)
Prothrombin Time: 20.1 s — ABNORMAL HIGH (ref 9.6–13.1)

## 2016-10-21 ENCOUNTER — Ambulatory Visit (INDEPENDENT_AMBULATORY_CARE_PROVIDER_SITE_OTHER): Payer: Medicare Other | Admitting: Internal Medicine

## 2016-10-21 ENCOUNTER — Encounter: Payer: Self-pay | Admitting: Internal Medicine

## 2016-10-21 VITALS — BP 158/110 | HR 96 | Ht 65.0 in | Wt 113.2 lb

## 2016-10-21 DIAGNOSIS — I10 Essential (primary) hypertension: Secondary | ICD-10-CM | POA: Diagnosis not present

## 2016-10-21 DIAGNOSIS — I482 Chronic atrial fibrillation: Secondary | ICD-10-CM

## 2016-10-21 DIAGNOSIS — I4821 Permanent atrial fibrillation: Secondary | ICD-10-CM

## 2016-10-21 DIAGNOSIS — I25810 Atherosclerosis of coronary artery bypass graft(s) without angina pectoris: Secondary | ICD-10-CM

## 2016-10-21 NOTE — Patient Instructions (Signed)

## 2016-10-21 NOTE — Progress Notes (Signed)
HPI Michelle Herrera returns today for ongoing evaluation and management of her atrial fib, HTN, and CAD. She was in the hospital with an embolism to her kidney resulting in a renal infarct. She has had problems in the past with anti-coagulation with epistaxis on OAC's. She has tolerated warfarin. She denies anginal symptoms. She admits to sodium indiscretion. Minimal edema. Allergies  Allergen Reactions  . Peanut-Containing Drug Products Anaphylaxis and Swelling  . Azithromycin Other (See Comments)    REACTION: pt states INTOL to ZPak  . Flomax [Tamsulosin Hcl] Swelling  . Pioglitazone Other (See Comments)    edema  . Pregabalin Other (See Comments)    REACTION: pt states INTOL to Lyrica  . Sulfonamide Derivatives Swelling     Current Outpatient Prescriptions  Medication Sig Dispense Refill  . acetaminophen (TYLENOL) 500 MG tablet Take 500 mg by mouth every 6 (six) hours as needed for mild pain.    [name] atenolol (TENORMIN) 50 MG tablet TAKE ONE TABLET BY MOUTH ONCE DAILY 90 tablet 3  . atorvastatin (LIPITOR) 40 MG tablet TAKE ONE TABLET BY MOUTH ONCE DAILY AT 6PM 90 tablet 1  . Blood Glucose Monitoring Suppl (ACCU-CHEK AVIVA PLUS) w/Device KIT Test blood sugar up to two times daily 1 kit 0  . diltiazem (CARTIA XT) 120 MG 24 hr capsule Take 1 capsule (120 mg total) by mouth daily. 90 capsule 2  . famotidine (PEPCID) 20 MG tablet Take 1 tablet (20 mg total) by mouth at bedtime. 30 tablet 0  . feeding supplement, ENSURE ENLIVE, (ENSURE ENLIVE) LIQD Take 237 mLs by mouth 2 (two) times daily between meals. 237 mL 12  . glipiZIDE (GLUCOTROL) 10 MG tablet Take 1 tablet (10 mg total) by mouth 2 (two) times daily. 180 tablet 2  . glucose blood (ACCU-CHEK AVIVA PLUS) test strip Test blood sugar up to two times daily 100 each 12  . LANCETS ULTRA FINE MISC Test blood sugar up to two times daily 100 each 11  . metFORMIN (GLUCOPHAGE) 500 MG tablet Take 500 mg by mouth 2 (two) times daily with a  meal.     . nitroGLYCERIN (NITROSTAT) 0.4 MG SL tablet Place 1 tablet (0.4 mg total) under the tongue every 5 (five) minutes as needed for chest pain (up to 3 doses). 25 tablet 3  . omeprazole (PRILOSEC) 20 MG capsule Take 1 tablet by mouth 30 minutes before breakfast 30 capsule 11  . vitamin B-12 (CYANOCOBALAMIN) 1000 MCG tablet Take 1,000 mcg by mouth every morning.     . warfarin (COUMADIN) 4 MG tablet Take 1 tablet (4 mg total) by mouth daily. (Patient taking differently: Current dose as of 6/21 is 1/2 tablet every evening) 30 tablet 11   No current facility-administered medications for this visit.      Past Medical History:  Diagnosis Date  . AF (atrial fibrillation) ([place])    a. Chronic Xarelto  . Anxiety state, unspecified   . Benign neoplasm of colon   . CAD (coronary artery disease)    a. 05/2012 NSTEMI/Cath/PCI: LM nl, LAD min irregs, LCX min irregs, RI 40-50 ost, OM1 nl, RCA dom, 69m(4.0x12 Vision BMS), PD/PL nl, EF 55%, at least mod MR. b. NSTEMI 03/2013 - cath with widely patent coronaries, suspect demand ischemia due to RVR.  .Marland KitchenChronic combined systolic and diastolic CHF (congestive heart failure) ([place]    a. 01/2011 Echo: EF 40-45%, diff HK, diast dysfxn, mild to mod MR, mod to sev  dil LA/RV/RA, mod to sev reduced RV fxn, mod TR, PASP 20mHg.;  b.  Echo 06/15/12:  EF 55%, mild MR, severe BAE, mod TR, PASP 35  . Diaphragmatic hernia without mention of obstruction or gangrene   . Epistaxis    a. s/p cauterization 11/2012.  .Marland KitchenHistory of stroke   . HTN (hypertension)   . Hx of cardiovascular stress test    a. Myoview in 2006 was negative for ischemia, EF 54%. (Martin Majesticon to have CAD development 05/2012.)  . Hypomagnesemia   . Lumbago   . Memory loss   . Mitral regurgitation    a. mild by echo 06/2012.  . Mixed incontinence urge and stress (female)(female)   . Osteoarthrosis, unspecified whether generalized or localized, unspecified site   . Osteoporosis, unspecified   . 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 Pure hypercholesterolemia   . [place]    01/2011 in setting of low INR  . Type II or unspecified type diabetes mellitus without mention of complication, not stated as uncontrolled     ROS:   All systems reviewed and negative except as noted in the HPI.   Past Surgical History:  Procedure Laterality Date  . cataract surgery/both eyes     01/2012 left eye---02/2012 right eye  . CHOLECYSTECTOMY    . LEFT HEART CATHETERIZATION WITH CORONARY ANGIOGRAM N/A 05/28/2012   Procedure: LEFT HEART CATHETERIZATION WITH CORONARY ANGIOGRAM;  Surgeon: MSherren Mocha MD;  [facility];  Service: Cardiovascular;  Laterality: N/A;  . LEFT HEART CATHETERIZATION WITH CORONARY ANGIOGRAM N/A 03/14/2013   Procedure: LEFT HEART CATHETERIZATION WITH CORONARY ANGIOGRAM;  Surgeon: HSinclair Grooms MD;  [facility];  Service: Cardiovascular;  Laterality: N/A;  . PERCUTANEOUS CORONARY STENT INTERVENTION (PCI-S)  05/28/2012   Procedure: PERCUTANEOUS CORONARY STENT INTERVENTION (PCI-S);  Surgeon: MSherren Mocha MD;  [facility];  Service: Cardiovascular;;  . rt.leg surgery  11/2010     Family History  Problem Relation Age of Onset  . Stomach cancer Father   . Pneumonia Mother   . CAD Brother      Social History   Social History  . Marital status: Widowed    Spouse name: N/A  . Number of children: 6  . Years of education: N/A   Occupational History  .  Retired   Social History Main Topics  . Smoking status: Never Smoker  . Smokeless tobacco: Never Used  . Alcohol use No  . Drug use: No  . Sexual activity: No   Other Topics Concern  . Not on file   Social History Narrative   1 sister--in poor health   1 brother hx of kidney stones and heart problems     BP (!) 158/110   Pulse 96   Ht _0  (1.651 m)   Wt 113 lb 3.2 oz (51.3 kg)   SpO2 96%   BMI 18.84 kg/m   Physical Exam:  Well appearing elderly woman, NAD HEENT:  Unremarkable Neck:  6 cm JVD, no thyromegally Lymphatics:  No adenopathy Back:  No CVA tenderness Lungs:  Clear with no wheezes HEART:  IRegular rate and rhythm, no murmurs, no rubs, no clicks Abd:  soft, positive bowel sounds, no organomegally, no rebound, no guarding Ext:  2 plus pulses, no edema, no cyanosis, no clubbing Skin:  No rashes no nodules Neuro:  CN II through XII intact, motor grossly intact  EKG - atrial fib with a controlled VR and NSSTT  abnormalities.  Assess/Plan: 1. Atrial fib - she is asymptomatic and her ventricular rate is controlled. She will continue her current meds. 2. HTN - her blood pressure is elevated. She admits to eating too much salt and I have encouraged her to reduce this.  3. Anti-coagulation - she appears to be tolerating her AA drug therapy nicely. Will follow. She gets her coumadin check with Dr. Lenna Gilford.  Mikle Bosworth.D.

## 2016-10-31 ENCOUNTER — Other Ambulatory Visit (INDEPENDENT_AMBULATORY_CARE_PROVIDER_SITE_OTHER): Payer: Medicare Other

## 2016-10-31 DIAGNOSIS — I4821 Permanent atrial fibrillation: Secondary | ICD-10-CM

## 2016-10-31 DIAGNOSIS — I482 Chronic atrial fibrillation: Secondary | ICD-10-CM

## 2016-10-31 LAB — PROTIME-INR
INR: 1.8 ratio — ABNORMAL HIGH (ref 0.8–1.0)
PROTHROMBIN TIME: 19.5 s — AB (ref 9.6–13.1)

## 2016-11-04 ENCOUNTER — Telehealth: Payer: Self-pay | Admitting: Pulmonary Disease

## 2016-11-04 MED ORDER — CIPROFLOXACIN HCL 250 MG PO TABS: 250.0000 mg | ORAL_TABLET | Freq: Two times a day (BID) | ORAL | 0 refills | Status: DC

## 2016-11-04 NOTE — Telephone Encounter (Signed)
Spoke with patient's son. He is aware of SN's recs. Will go ahead and call in abx to the [facility] on [name]. Nothing needed at time of call.

## 2016-11-04 NOTE — Telephone Encounter (Signed)
LMTCB

## 2016-11-04 NOTE — Telephone Encounter (Signed)
Pt's son returning call.  [phone]

## 2016-11-04 NOTE — Telephone Encounter (Signed)
Spoke with the pt's son He states that pt has been c/o frequent urination and believes she has a bladder infection  I asked if she has had any fever or pain and he states she has not mentioned any of that SN- please advise thanks! Allergies  Allergen Reactions  . Peanut-Containing Drug Products Anaphylaxis and Swelling  . Azithromycin Other (See Comments)    REACTION: pt states INTOL to ZPak  . Flomax [Tamsulosin Hcl] Swelling  . Pioglitazone Other (See Comments)    edema  . Pregabalin Other (See Comments)    REACTION: pt states INTOL to Lyrica  . Sulfonamide Derivatives Swelling

## 2016-11-04 NOTE — Telephone Encounter (Signed)
LMTCB. Call went straight to VM.

## 2016-11-04 NOTE — Telephone Encounter (Signed)
Per SN---  Pt will need to take the cipro 250 mg  #10  1 po bid

## 2016-11-21 ENCOUNTER — Telehealth: Payer: Self-pay | Admitting: Pulmonary Disease

## 2016-11-21 ENCOUNTER — Other Ambulatory Visit (INDEPENDENT_AMBULATORY_CARE_PROVIDER_SITE_OTHER): Payer: Medicare Other

## 2016-11-21 DIAGNOSIS — I482 Chronic atrial fibrillation: Secondary | ICD-10-CM

## 2016-11-21 DIAGNOSIS — I4821 Permanent atrial fibrillation: Secondary | ICD-10-CM

## 2016-11-21 LAB — PROTIME-INR
INR: 3.9 ratio — ABNORMAL HIGH (ref 0.8–1.0)
Prothrombin Time: 41.4 s — ABNORMAL HIGH (ref 9.6–13.1)

## 2016-11-21 NOTE — Telephone Encounter (Signed)
ATC pt, no answer. Unable to leave VM because it is not setup yet. Will try again later.

## 2016-11-21 NOTE — Telephone Encounter (Signed)
Attempted to call pt again, but no answer and unable to leave VM.

## 2016-11-22 MED ORDER — FUROSEMIDE 20 MG PO TABS: 20.0000 mg | ORAL_TABLET | Freq: Every day | ORAL | 3 refills | Status: AC

## 2016-11-22 NOTE — Telephone Encounter (Signed)
Called pt and advised message from the provider. Pt understood and verbalized understanding. Nothing further is needed.   Rx sent to pharmacy.  

## 2016-11-22 NOTE — Telephone Encounter (Signed)
Per SN advises pt to eliminate salt/sodium intake. Per SN he would like lasix 20mg  - #30 with refills; take one in the mornings with breakfast, prn for swelling. Routing message back to triage.

## 2016-11-22 NOTE — Telephone Encounter (Signed)
Spoke with pt. States that her hands and feet are swelling. Reports that this started 2-3 days ago. Denies SOB. Hands are not warm to the touch, red or painful. Pt would like to have SN's recommendations.  Allergies  Allergen Reactions  . Peanut-Containing Drug Products Anaphylaxis and Swelling  . Azithromycin Other (See Comments)    REACTION: pt states INTOL to ZPak  . Flomax [Tamsulosin Hcl] Swelling  . Pioglitazone Other (See Comments)    edema  . Pregabalin Other (See Comments)    REACTION: pt states INTOL to Lyrica  . Sulfonamide Derivatives Swelling     SN - please advise. Thanks.

## 2016-11-24 ENCOUNTER — Telehealth: Payer: Self-pay | Admitting: Pulmonary Disease

## 2016-11-24 NOTE — Progress Notes (Signed)
Called and spoke with pt. Informed her of the results and recs per SN. Pt wrote down information and read it back to me as what she needs to do with her Coumadin. Pt verbalized understanding and denied any further questions or concerns at this time.

## 2016-11-24 NOTE — Telephone Encounter (Signed)
Note    Called pt and advised message from the provider. Pt understood and verbalized understanding. Nothing further is needed.   Rx sent to pharmacy.           9:49 AM  Georjean Mode, CMA routed this conversation to [facility] Triage Chyrl Civatte, Zachery Dauer, CMA      9:42 AM  Note    Per SN advises pt to eliminate salt/sodium intake. Per SN he would like lasix 20mg  - #30 with refills; take one in the mornings with breakfast, prn for swelling. Routing message back to triage     I called Shanon Brow back, unable to leave VM on machine because it has not been set up yet. Will try again later.

## 2016-11-25 NOTE — Telephone Encounter (Signed)
Notes recorded by Noralee Space, MD on 11/22/2016 at 2:31 PM EST Please notify patient & brother>  On Coumadin 4mg  tabs- taking 1tab x3d=MWF, and 1/2 tab x4d=TThSS Protime today is too thin-- this could be from the recent Cipro antibiotic treatment... REC to HOLD Coumadin for 3 days (none for 3 days) then restart at 1/2 tab daily (1/2 tab x7d=MTWThFSS)... Plan to recheck Protime on Mon 11/26 please...  Spoke with pt's brother and went over the details of this result note to him. Nothing further is needed.

## 2016-11-28 ENCOUNTER — Telehealth: Payer: Self-pay | Admitting: Internal Medicine

## 2016-11-29 ENCOUNTER — Other Ambulatory Visit: Payer: Self-pay | Admitting: Internal Medicine

## 2016-11-29 MED ORDER — DILTIAZEM HCL ER COATED BEADS 120 MG PO CP24: 120.0000 mg | ORAL_CAPSULE | Freq: Every day | ORAL | 3 refills | Status: AC

## 2016-12-05 ENCOUNTER — Telehealth: Payer: Self-pay | Admitting: Pulmonary Disease

## 2016-12-05 ENCOUNTER — Other Ambulatory Visit (INDEPENDENT_AMBULATORY_CARE_PROVIDER_SITE_OTHER): Payer: Medicare Other

## 2016-12-05 DIAGNOSIS — R319 Hematuria, unspecified: Secondary | ICD-10-CM

## 2016-12-05 DIAGNOSIS — I4821 Permanent atrial fibrillation: Secondary | ICD-10-CM

## 2016-12-05 DIAGNOSIS — N390 Urinary tract infection, site not specified: Secondary | ICD-10-CM | POA: Diagnosis not present

## 2016-12-05 DIAGNOSIS — I482 Chronic atrial fibrillation: Secondary | ICD-10-CM | POA: Diagnosis not present

## 2016-12-05 LAB — URINALYSIS, ROUTINE W REFLEX MICROSCOPIC
BILIRUBIN URINE: NEGATIVE
NITRITE: POSITIVE — AB
Specific Gravity, Urine: 1.025 (ref 1.000–1.030)
Total Protein, Urine: >=300 — AB
Urine Glucose: NEGATIVE
Urobilinogen, UA: 2 — AB (ref 0.0–1.0)
pH: 5.5 (ref 5.0–8.0)

## 2016-12-05 LAB — PROTIME-INR
INR: 2.2 ratio — AB (ref 0.8–1.0)
Prothrombin Time: 23.3 s — ABNORMAL HIGH (ref 9.6–13.1)

## 2016-12-05 MED ORDER — AMOXICILLIN-POT CLAVULANATE 875-125 MG PO TABS: 1.0000 | ORAL_TABLET | Freq: Two times a day (BID) | ORAL | 0 refills | Status: DC

## 2016-12-05 NOTE — Telephone Encounter (Signed)
Patient and son is waiting in lobby requesting to see SN; declined appointment with another provider

## 2016-12-05 NOTE — Telephone Encounter (Signed)
ATC pt's son, unable to leave vm due to mailbox not being set up.  Will call back

## 2016-12-05 NOTE — Telephone Encounter (Signed)
Per SN:  Pt will need to get a urinalysis, urine culture, and Pt blood test. PT blood test was already done today per Epic. We will call witht the results. Her brother agreed with the plan.

## 2016-12-06 LAB — URINE CULTURE
MICRO NUMBER:: 81323571
SPECIMEN QUALITY: ADEQUATE

## 2016-12-06 NOTE — Telephone Encounter (Signed)
Called and spoke with pt's son, Michelle Herrera. Michelle Herrera is aware of results and voiced his understanding.  Nothing further is needed.   Notes recorded by Noralee Space, MD on 12/05/2016 at 3:48 PM EST Please notify patient & her brother who is in charge of her coumadin>> 1) Urine shows UTI w/ blood c/w hemorrhagic cystitis--   REC to call in Augmentin 875mg  #14- one tab by mouth twice daily til gone... 2) Protime is good on the Coumadin 4mg - taking 1/2 tab daily x all 7 days (MTWThFSS)   HOWEVER- all antibiotics can interact w/ Coumadin so I REC to HOLD COUMADIN x the next 5 days...   Then restart at same dose 1/2 tab daily every afternoon...   Please mark calendar to return Mon 12/17 AM for next repeat PROTIME.Marland KitchenMarland Kitchen

## 2016-12-06 NOTE — Telephone Encounter (Signed)
Patient's son returning call, Dellia Cloud is [phone].

## 2016-12-21 ENCOUNTER — Telehealth: Payer: Self-pay | Admitting: Pulmonary Disease

## 2016-12-21 DIAGNOSIS — R319 Hematuria, unspecified: Secondary | ICD-10-CM

## 2016-12-21 NOTE — Telephone Encounter (Signed)
Spoke with pt's son Shanon Brow, states that pt was prescribed augmentin for UTI on 12/05/16.  Pt finished treatment, and now is again c/o blood in urine X2 days.  Pt has not noted any painful urination/cloudiness/odor with urination.  Shanon Brow is requesting further recs for pt- wants to know if she needs to come in for exam or repeat UA.  SN please advise on recs.  Thanks!

## 2016-12-21 NOTE — Telephone Encounter (Signed)
Per SN: Clean catch ua and culture, as well as PT INR.  Spoke with pt's son, aware of recs.  Orders placed.  Nothing further needed.

## 2016-12-22 ENCOUNTER — Telehealth: Payer: Self-pay | Admitting: Pulmonary Disease

## 2016-12-22 ENCOUNTER — Ambulatory Visit (INDEPENDENT_AMBULATORY_CARE_PROVIDER_SITE_OTHER)
Admission: RE | Admit: 2016-12-22 | Discharge: 2016-12-22 | Disposition: A | Discharge status: Home / Self Care | Payer: Medicare Other | Source: Ambulatory Visit | Attending: Pulmonary Disease | Admitting: Pulmonary Disease

## 2016-12-22 ENCOUNTER — Ambulatory Visit (INDEPENDENT_AMBULATORY_CARE_PROVIDER_SITE_OTHER): Payer: Medicare Other | Admitting: Pulmonary Disease

## 2016-12-22 ENCOUNTER — Other Ambulatory Visit (INDEPENDENT_AMBULATORY_CARE_PROVIDER_SITE_OTHER): Payer: Medicare Other

## 2016-12-22 VITALS — BP 134/72 | HR 82 | Temp 97.5°F

## 2016-12-22 DIAGNOSIS — N280 Ischemia and infarction of kidney: Secondary | ICD-10-CM | POA: Diagnosis not present

## 2016-12-22 DIAGNOSIS — I482 Chronic atrial fibrillation: Secondary | ICD-10-CM

## 2016-12-22 DIAGNOSIS — E039 Hypothyroidism, unspecified: Secondary | ICD-10-CM | POA: Diagnosis not present

## 2016-12-22 DIAGNOSIS — R609 Edema, unspecified: Secondary | ICD-10-CM

## 2016-12-22 DIAGNOSIS — E0840 Diabetes mellitus due to underlying condition with diabetic neuropathy, unspecified: Secondary | ICD-10-CM | POA: Diagnosis not present

## 2016-12-22 DIAGNOSIS — N179 Acute kidney failure, unspecified: Secondary | ICD-10-CM

## 2016-12-22 DIAGNOSIS — I4821 Permanent atrial fibrillation: Secondary | ICD-10-CM

## 2016-12-22 DIAGNOSIS — N390 Urinary tract infection, site not specified: Secondary | ICD-10-CM | POA: Insufficient documentation

## 2016-12-22 DIAGNOSIS — R413 Other amnesia: Secondary | ICD-10-CM

## 2016-12-22 DIAGNOSIS — I10 Essential (primary) hypertension: Secondary | ICD-10-CM

## 2016-12-22 DIAGNOSIS — R319 Hematuria, unspecified: Secondary | ICD-10-CM

## 2016-12-22 DIAGNOSIS — R269 Unspecified abnormalities of gait and mobility: Secondary | ICD-10-CM | POA: Diagnosis not present

## 2016-12-22 DIAGNOSIS — N3001 Acute cystitis with hematuria: Secondary | ICD-10-CM

## 2016-12-22 DIAGNOSIS — J90 Pleural effusion, not elsewhere classified: Secondary | ICD-10-CM | POA: Diagnosis not present

## 2016-12-22 LAB — COMPREHENSIVE METABOLIC PANEL
ALBUMIN: 3.8 g/dL (ref 3.5–5.2)
ALT: 9 U/L (ref 0–35)
AST: 14 U/L (ref 0–37)
Alkaline Phosphatase: 78 U/L (ref 39–117)
BUN: 18 mg/dL (ref 6–23)
CHLORIDE: 101 meq/L (ref 96–112)
CO2: 28 mEq/L (ref 19–32)
Calcium: 8.9 mg/dL (ref 8.4–10.5)
Creatinine, Ser: 1.07 mg/dL (ref 0.40–1.20)
GFR: 51.89 mL/min — ABNORMAL LOW (ref >60.00)
GLUCOSE: 161 mg/dL — AB (ref 70–99)
POTASSIUM: 3.4 meq/L — AB (ref 3.5–5.1)
SODIUM: 142 meq/L (ref 135–145)
TOTAL PROTEIN: 6.5 g/dL (ref 6.0–8.3)
Total Bilirubin: 1.3 mg/dL — ABNORMAL HIGH (ref 0.2–1.2)

## 2016-12-22 LAB — TSH: TSH: 8.12 u[IU]/mL — ABNORMAL HIGH (ref 0.35–4.50)

## 2016-12-22 LAB — CBC WITH DIFFERENTIAL/PLATELET
BASOS PCT: 1.2 % (ref 0.0–3.0)
Basophils Absolute: 0.1 10*3/uL (ref 0.0–0.1)
EOS PCT: 0.4 % (ref 0.0–5.0)
Eosinophils Absolute: 0 10*3/uL (ref 0.0–0.7)
HEMATOCRIT: 39.3 % (ref 36.0–46.0)
HEMOGLOBIN: 12.9 g/dL (ref 12.0–15.0)
LYMPHS PCT: 5.5 % — AB (ref 12.0–46.0)
Lymphs Abs: 0.4 10*3/uL — ABNORMAL LOW (ref 0.7–4.0)
MCHC: 32.7 g/dL (ref 30.0–36.0)
MCV: 97.1 fl (ref 78.0–100.0)
MONO ABS: 0.6 10*3/uL (ref 0.1–1.0)
Monocytes Relative: 9.4 % (ref 3.0–12.0)
NEUTROS ABS: 5.5 10*3/uL (ref 1.4–7.7)
Neutrophils Relative %: 83.5 % — ABNORMAL HIGH (ref 43.0–77.0)
Platelets: 290 10*3/uL (ref 150.0–400.0)
RBC: 4.05 Mil/uL (ref 3.87–5.11)
RDW: 17.1 % — AB (ref 11.5–15.5)
WBC: 6.5 10*3/uL (ref 4.0–10.5)

## 2016-12-22 LAB — BRAIN NATRIURETIC PEPTIDE: PRO B NATRI PEPTIDE: 2994 pg/mL — AB (ref 0.0–100.0)

## 2016-12-22 LAB — HEMOGLOBIN A1C: Hgb A1c MFr Bld: 6.9 % — ABNORMAL HIGH (ref 4.6–6.5)

## 2016-12-22 LAB — SEDIMENTATION RATE: Sed Rate: 11 mm/hr (ref 0–30)

## 2016-12-22 LAB — PROTIME-INR
INR: 3 ratio — ABNORMAL HIGH (ref 0.8–1.0)
Prothrombin Time: 31.9 s — ABNORMAL HIGH (ref 9.6–13.1)

## 2016-12-22 NOTE — Patient Instructions (Addendum)
Today we updated your med list in our EPIC system...    Continue your current medications the same...  HOLD COUMADIN DOSE TODAY (do not take today's scheduled dose)...  Today we did a follow up CXR & blood work + urinalysis    We will contact you w/ the results ASAP & make any changes in her meds at that time...    We will also decide at that time when we will need to recheck you  Be sure to take the fluid pill- Furosemide (LASIX) 20mg  one tab each AM...    Remember> NO SALT IN DIET...    KEEP LEGS Elevated please

## 2016-12-22 NOTE — Telephone Encounter (Signed)
We attempted to call son so we could inform him on his moms, Michelle Herrera medication changes. Dr. Lenna Gilford said to not take coumadin tonight 12/13 and to take lasix as prescribed and be complaint. Dr. Lenna Gilford will call them tomorrow and let them know of the other lab results as they become available.

## 2016-12-22 NOTE — Telephone Encounter (Signed)
Attempted to call pt's son, Shanon Brow but no answer. Left message telling him that Dr. Lenna Gilford stated he would call them tomorrow to go over medication changes and lab results. Nothing further needed.

## 2016-12-23 ENCOUNTER — Telehealth: Payer: Self-pay | Admitting: Pulmonary Disease

## 2016-12-23 ENCOUNTER — Inpatient Hospital Stay (HOSPITAL_COMMUNITY)
Admission: EM | Admit: 2016-12-23 | Discharge: 2016-12-30 | DRG: 292 | Disposition: A | Discharge status: Home Health | Payer: Medicare Other | Attending: Internal Medicine | Admitting: Internal Medicine

## 2016-12-23 ENCOUNTER — Encounter: Payer: Self-pay | Admitting: Pulmonary Disease

## 2016-12-23 ENCOUNTER — Other Ambulatory Visit: Payer: Self-pay | Admitting: Pulmonary Disease

## 2016-12-23 ENCOUNTER — Emergency Department (HOSPITAL_COMMUNITY): Payer: Medicare Other

## 2016-12-23 ENCOUNTER — Encounter (HOSPITAL_COMMUNITY): Payer: Self-pay

## 2016-12-23 DIAGNOSIS — I248 Other forms of acute ischemic heart disease: Secondary | ICD-10-CM | POA: Diagnosis present

## 2016-12-23 DIAGNOSIS — I25119 Atherosclerotic heart disease of native coronary artery with unspecified angina pectoris: Secondary | ICD-10-CM | POA: Diagnosis not present

## 2016-12-23 DIAGNOSIS — Z7901 Long term (current) use of anticoagulants: Secondary | ICD-10-CM | POA: Diagnosis not present

## 2016-12-23 DIAGNOSIS — I5033 Acute on chronic diastolic (congestive) heart failure: Secondary | ICD-10-CM | POA: Diagnosis not present

## 2016-12-23 DIAGNOSIS — I4821 Permanent atrial fibrillation: Secondary | ICD-10-CM | POA: Diagnosis present

## 2016-12-23 DIAGNOSIS — I2510 Atherosclerotic heart disease of native coronary artery without angina pectoris: Secondary | ICD-10-CM | POA: Diagnosis present

## 2016-12-23 DIAGNOSIS — I361 Nonrheumatic tricuspid (valve) insufficiency: Secondary | ICD-10-CM | POA: Diagnosis not present

## 2016-12-23 DIAGNOSIS — R609 Edema, unspecified: Secondary | ICD-10-CM | POA: Insufficient documentation

## 2016-12-23 DIAGNOSIS — Z8673 Personal history of transient ischemic attack (TIA), and cerebral infarction without residual deficits: Secondary | ICD-10-CM | POA: Diagnosis not present

## 2016-12-23 DIAGNOSIS — K219 Gastro-esophageal reflux disease without esophagitis: Secondary | ICD-10-CM | POA: Diagnosis present

## 2016-12-23 DIAGNOSIS — E875 Hyperkalemia: Secondary | ICD-10-CM | POA: Diagnosis present

## 2016-12-23 DIAGNOSIS — J90 Pleural effusion, not elsewhere classified: Secondary | ICD-10-CM | POA: Insufficient documentation

## 2016-12-23 DIAGNOSIS — E1140 Type 2 diabetes mellitus with diabetic neuropathy, unspecified: Secondary | ICD-10-CM | POA: Diagnosis present

## 2016-12-23 DIAGNOSIS — Z7984 Long term (current) use of oral hypoglycemic drugs: Secondary | ICD-10-CM | POA: Diagnosis not present

## 2016-12-23 DIAGNOSIS — E785 Hyperlipidemia, unspecified: Secondary | ICD-10-CM | POA: Diagnosis present

## 2016-12-23 DIAGNOSIS — I110 Hypertensive heart disease with heart failure: Secondary | ICD-10-CM | POA: Diagnosis present

## 2016-12-23 DIAGNOSIS — Z79899 Other long term (current) drug therapy: Secondary | ICD-10-CM | POA: Diagnosis not present

## 2016-12-23 DIAGNOSIS — Z66 Do not resuscitate: Secondary | ICD-10-CM | POA: Diagnosis present

## 2016-12-23 DIAGNOSIS — M810 Age-related osteoporosis without current pathological fracture: Secondary | ICD-10-CM | POA: Diagnosis present

## 2016-12-23 DIAGNOSIS — Z955 Presence of coronary angioplasty implant and graft: Secondary | ICD-10-CM | POA: Diagnosis not present

## 2016-12-23 DIAGNOSIS — Z9119 Patient's noncompliance with other medical treatment and regimen: Secondary | ICD-10-CM | POA: Diagnosis not present

## 2016-12-23 DIAGNOSIS — E7800 Pure hypercholesterolemia, unspecified: Secondary | ICD-10-CM | POA: Diagnosis present

## 2016-12-23 DIAGNOSIS — F411 Generalized anxiety disorder: Secondary | ICD-10-CM | POA: Diagnosis present

## 2016-12-23 DIAGNOSIS — R0602 Shortness of breath: Secondary | ICD-10-CM | POA: Diagnosis present

## 2016-12-23 DIAGNOSIS — R319 Hematuria, unspecified: Secondary | ICD-10-CM | POA: Diagnosis present

## 2016-12-23 DIAGNOSIS — I482 Chronic atrial fibrillation: Secondary | ICD-10-CM | POA: Diagnosis present

## 2016-12-23 DIAGNOSIS — I272 Pulmonary hypertension, unspecified: Secondary | ICD-10-CM | POA: Diagnosis present

## 2016-12-23 DIAGNOSIS — I10 Essential (primary) hypertension: Secondary | ICD-10-CM | POA: Diagnosis present

## 2016-12-23 DIAGNOSIS — E039 Hypothyroidism, unspecified: Secondary | ICD-10-CM | POA: Insufficient documentation

## 2016-12-23 DIAGNOSIS — Z8249 Family history of ischemic heart disease and other diseases of the circulatory system: Secondary | ICD-10-CM | POA: Diagnosis not present

## 2016-12-23 DIAGNOSIS — I5043 Acute on chronic combined systolic (congestive) and diastolic (congestive) heart failure: Secondary | ICD-10-CM

## 2016-12-23 DIAGNOSIS — I252 Old myocardial infarction: Secondary | ICD-10-CM | POA: Diagnosis not present

## 2016-12-23 DIAGNOSIS — R413 Other amnesia: Secondary | ICD-10-CM | POA: Diagnosis not present

## 2016-12-23 DIAGNOSIS — Z79890 Hormone replacement therapy (postmenopausal): Secondary | ICD-10-CM | POA: Diagnosis not present

## 2016-12-23 DIAGNOSIS — F0390 Unspecified dementia without behavioral disturbance: Secondary | ICD-10-CM | POA: Diagnosis present

## 2016-12-23 LAB — URINE CULTURE
MICRO NUMBER:: 81402391
SPECIMEN QUALITY: ADEQUATE

## 2016-12-23 LAB — CBC WITH DIFFERENTIAL/PLATELET
Basophils Absolute: 0 10*3/uL (ref 0.0–0.1)
Basophils Relative: 1 %
EOS PCT: 1 %
Eosinophils Absolute: 0.1 10*3/uL (ref 0.0–0.7)
HEMATOCRIT: 37 % (ref 36.0–46.0)
Hemoglobin: 12.6 g/dL (ref 12.0–15.0)
LYMPHS ABS: 0.5 10*3/uL — AB (ref 0.7–4.0)
LYMPHS PCT: 6 %
MCH: 32.1 pg (ref 26.0–34.0)
MCHC: 34.1 g/dL (ref 30.0–36.0)
MCV: 94.1 fL (ref 78.0–100.0)
MONO ABS: 0.8 10*3/uL (ref 0.1–1.0)
MONOS PCT: 10 %
NEUTROS ABS: 6.8 10*3/uL (ref 1.7–7.7)
Neutrophils Relative %: 82 %
PLATELETS: 284 10*3/uL (ref 150–400)
RBC: 3.93 MIL/uL (ref 3.87–5.11)
RDW: 16.7 % — AB (ref 11.5–15.5)
WBC: 8.2 10*3/uL (ref 4.0–10.5)

## 2016-12-23 LAB — I-STAT CHEM 8, ED
BUN: 21 mg/dL — ABNORMAL HIGH (ref 6–20)
CALCIUM ION: 1.14 mmol/L — AB (ref 1.15–1.40)
CHLORIDE: 102 mmol/L (ref 101–111)
Creatinine, Ser: 1 mg/dL (ref 0.44–1.00)
GLUCOSE: 142 mg/dL — AB (ref 65–99)
HCT: 42 % (ref 36.0–46.0)
HEMOGLOBIN: 14.3 g/dL (ref 12.0–15.0)
POTASSIUM: 3.4 mmol/L — AB (ref 3.5–5.1)
SODIUM: 143 mmol/L (ref 135–145)
TCO2: 25 mmol/L (ref 22–32)

## 2016-12-23 LAB — COMPREHENSIVE METABOLIC PANEL
ALBUMIN: 3.5 g/dL (ref 3.5–5.0)
ALK PHOS: 90 U/L (ref 38–126)
ALT: 10 U/L — ABNORMAL LOW (ref 14–54)
ANION GAP: 14 (ref 5–15)
AST: 35 U/L (ref 15–41)
BILIRUBIN TOTAL: 1.5 mg/dL — AB (ref 0.3–1.2)
BUN: 23 mg/dL — AB (ref 6–20)
CALCIUM: 9.1 mg/dL (ref 8.9–10.3)
CO2: 25 mmol/L (ref 22–32)
Chloride: 103 mmol/L (ref 101–111)
Creatinine, Ser: 1.23 mg/dL — ABNORMAL HIGH (ref 0.44–1.00)
GFR calc Af Amer: 45 mL/min — ABNORMAL LOW (ref >60)
GFR, EST NON AFRICAN AMERICAN: 39 mL/min — AB (ref >60)
GLUCOSE: 145 mg/dL — AB (ref 65–99)
POTASSIUM: 4.4 mmol/L (ref 3.5–5.1)
Sodium: 142 mmol/L (ref 135–145)
TOTAL PROTEIN: 6.8 g/dL (ref 6.5–8.1)

## 2016-12-23 LAB — TROPONIN I: Troponin I: 1.05 ng/mL (ref <0.03)

## 2016-12-23 LAB — GLUCOSE, CAPILLARY: Glucose-Capillary: 173 mg/dL — ABNORMAL HIGH (ref 65–99)

## 2016-12-23 LAB — PROTIME-INR
INR: 2.22
PROTHROMBIN TIME: 24.5 s — AB (ref 11.4–15.2)

## 2016-12-23 LAB — MAGNESIUM: Magnesium: 1.3 mg/dL — ABNORMAL LOW (ref 1.7–2.4)

## 2016-12-23 LAB — BRAIN NATRIURETIC PEPTIDE: B Natriuretic Peptide: 1582.2 pg/mL — ABNORMAL HIGH (ref 0.0–100.0)

## 2016-12-23 MED ORDER — FUROSEMIDE 10 MG/ML IJ SOLN
40.0000 mg | Freq: Two times a day (BID) | INTRAMUSCULAR | Status: DC
  Administered 2016-12-24 – 2016-12-27 (×7): 40 mg via INTRAVENOUS
  Filled 2016-12-23 (×7): qty 4

## 2016-12-23 MED ORDER — SODIUM CHLORIDE 0.9% FLUSH
3.0000 mL | Freq: Two times a day (BID) | INTRAVENOUS | Status: DC
  Administered 2016-12-24 – 2016-12-30 (×10): 3 mL via INTRAVENOUS

## 2016-12-23 MED ORDER — TETRAHYDROZOLINE HCL 0.05 % OP SOLN: 2.0000 [drp] | Freq: Every day | OPHTHALMIC | Status: DC | PRN

## 2016-12-23 MED ORDER — ENSURE ENLIVE PO LIQD: 237.0000 mL | Freq: Two times a day (BID) | ORAL | Status: DC

## 2016-12-23 MED ORDER — LEVOTHYROXINE SODIUM 50 MCG PO TABS: 50.0000 ug | ORAL_TABLET | Freq: Every day | ORAL | Status: DC

## 2016-12-23 MED ORDER — ACETAMINOPHEN 500 MG PO TABS: 500.0000 mg | ORAL_TABLET | Freq: Four times a day (QID) | ORAL | Status: DC | PRN

## 2016-12-23 MED ORDER — ATORVASTATIN CALCIUM 40 MG PO TABS: 40.0000 mg | ORAL_TABLET | Freq: Every day | ORAL | Status: DC

## 2016-12-23 MED ORDER — INSULIN ASPART 100 UNIT/ML ~~LOC~~ SOLN
0.0000 [IU] | Freq: Three times a day (TID) | SUBCUTANEOUS | Status: DC
  Administered 2016-12-24: 1 [IU] via SUBCUTANEOUS
  Administered 2016-12-24: 2 [IU] via SUBCUTANEOUS
  Administered 2016-12-25 – 2016-12-26 (×4): 1 [IU] via SUBCUTANEOUS
  Administered 2016-12-27: 2 [IU] via SUBCUTANEOUS
  Administered 2016-12-27: 1 [IU] via SUBCUTANEOUS
  Administered 2016-12-27: 2 [IU] via SUBCUTANEOUS
  Administered 2016-12-28: 3 [IU] via SUBCUTANEOUS
  Administered 2016-12-28 – 2016-12-29 (×3): 1 [IU] via SUBCUTANEOUS
  Administered 2016-12-29 (×2): 2 [IU] via SUBCUTANEOUS
  Administered 2016-12-30: 3 [IU] via SUBCUTANEOUS

## 2016-12-23 MED ORDER — POTASSIUM CHLORIDE 20 MEQ PO PACK: 40.0000 meq | PACK | Freq: Once | ORAL | Status: DC

## 2016-12-23 MED ORDER — SODIUM CHLORIDE 0.9 % IV SOLN: 250.0000 mL | INTRAVENOUS | Status: DC | PRN

## 2016-12-23 MED ORDER — SODIUM CHLORIDE 0.9% FLUSH: 3.0000 mL | INTRAVENOUS | Status: DC | PRN

## 2016-12-23 MED ORDER — ACETAMINOPHEN 325 MG PO TABS: 650.0000 mg | ORAL_TABLET | ORAL | Status: DC | PRN

## 2016-12-23 MED ORDER — POTASSIUM CHLORIDE CRYS ER 20 MEQ PO TBCR
40.0000 meq | EXTENDED_RELEASE_TABLET | Freq: Every day | ORAL | Status: DC
  Administered 2016-12-24: 40 meq via ORAL
  Filled 2016-12-23 (×2): qty 2

## 2016-12-23 MED ORDER — LEVOTHYROXINE SODIUM 50 MCG PO TABS: 50.0000 ug | ORAL_TABLET | Freq: Every day | ORAL | 11 refills | Status: DC

## 2016-12-23 MED ORDER — MAGNESIUM SULFATE 4 GM/100ML IV SOLN
4.0000 g | Freq: Once | INTRAVENOUS | Status: AC
  Administered 2016-12-23: 4 g via INTRAVENOUS
  Filled 2016-12-23: qty 100

## 2016-12-23 MED ORDER — POTASSIUM CHLORIDE ER 8 MEQ PO CPCR: 40.0000 meq | ORAL_CAPSULE | Freq: Every day | ORAL | Status: DC

## 2016-12-23 MED ORDER — WARFARIN - PHARMACIST DOSING INPATIENT
Freq: Every day | Status: DC
  Administered 2016-12-27: 17:00:00

## 2016-12-23 MED ORDER — ATENOLOL 50 MG PO TABS
50.0000 mg | ORAL_TABLET | Freq: Every day | ORAL | Status: DC
  Administered 2016-12-24 – 2016-12-27 (×4): 50 mg via ORAL
  Filled 2016-12-23 (×4): qty 1

## 2016-12-23 MED ORDER — PANTOPRAZOLE SODIUM 40 MG PO TBEC: 40.0000 mg | DELAYED_RELEASE_TABLET | Freq: Every day | ORAL | Status: DC

## 2016-12-23 MED ORDER — BISMUTH SUBSALICYLATE 262 MG/15ML PO SUSP: 30.0000 mL | Freq: Every day | ORAL | Status: DC | PRN

## 2016-12-23 MED ORDER — FUROSEMIDE 10 MG/ML IJ SOLN
40.0000 mg | Freq: Once | INTRAMUSCULAR | Status: AC
  Administered 2016-12-23: 40 mg via INTRAVENOUS
  Filled 2016-12-23: qty 4

## 2016-12-23 MED ORDER — WARFARIN SODIUM 2 MG PO TABS
2.0000 mg | ORAL_TABLET | Freq: Once | ORAL | Status: AC
  Administered 2016-12-23: 2 mg via ORAL
  Filled 2016-12-23: qty 1

## 2016-12-23 MED ORDER — POTASSIUM CHLORIDE CRYS ER 20 MEQ PO TBCR
40.0000 meq | EXTENDED_RELEASE_TABLET | Freq: Once | ORAL | Status: AC
  Administered 2016-12-23: 40 meq via ORAL
  Filled 2016-12-23: qty 2

## 2016-12-23 MED ORDER — POTASSIUM CHLORIDE ER 10 MEQ PO CPCR: 20.0000 meq | ORAL_CAPSULE | Freq: Every day | ORAL | 5 refills | Status: AC

## 2016-12-23 MED ORDER — DILTIAZEM HCL ER COATED BEADS 120 MG PO CP24
120.0000 mg | ORAL_CAPSULE | Freq: Every day | ORAL | Status: DC
  Administered 2016-12-24 – 2016-12-25 (×2): 120 mg via ORAL
  Filled 2016-12-23 (×2): qty 1

## 2016-12-23 MED ORDER — VITAMIN B-12 1000 MCG PO TABS: 1000.0000 ug | ORAL_TABLET | Freq: Every morning | ORAL | Status: DC

## 2016-12-23 MED ORDER — ONDANSETRON HCL 4 MG/2ML IJ SOLN: 4.0000 mg | Freq: Four times a day (QID) | INTRAMUSCULAR | Status: DC | PRN

## 2016-12-23 NOTE — Telephone Encounter (Signed)
[place] look out for lab results. See previous message.

## 2016-12-23 NOTE — ED Notes (Signed)
Pt pulse ox changed to a different finger. 98% on RA. Pt to remain on room air.

## 2016-12-23 NOTE — ED Notes (Signed)
Patient given lasix and placed on the pure wick.

## 2016-12-23 NOTE — ED Notes (Signed)
Bed: WA17 Expected date:  Expected time:  Means of arrival:  Comments: Triage 3 

## 2016-12-23 NOTE — Telephone Encounter (Signed)
Shanon Brow called and given Dr. Brendolyn Patty recommendation to go to the ER. Shanon Brow notified to call with any other questions. Nothing further needed.

## 2016-12-23 NOTE — ED Notes (Signed)
O2 4L/min- Sats increased to 94%

## 2016-12-23 NOTE — Progress Notes (Signed)
ANTICOAGULATION CONSULT NOTE - Follow Up Consult  Pharmacy Consult for Warfarin Indication: atrial fibrillation  Allergies  Allergen Reactions  . Peanut-Containing Drug Products Anaphylaxis and Swelling  . Atorvastatin     "MAKES HER FEEL BAD"  . Azithromycin Other (See Comments)    REACTION: pt states INTOL to ZPak  . Flomax [Tamsulosin Hcl] Swelling  . Pioglitazone Other (See Comments)    edema  . Pregabalin Other (See Comments)    REACTION: pt states INTOL to Lyrica  . Sulfonamide Derivatives Swelling    Patient Measurements:    Vital Signs: Temp: 97.5 F (36.4 C) (12/14 1231) Temp Source: Oral (12/14 1231) BP: 116/74 (12/14 1804) Pulse Rate: 81 (12/14 1804)  Labs: Recent Labs    12/22/16 1109 12/23/16 1558 12/23/16 1608  HGB 12.9 12.6 14.3  HCT 39.3 37.0 42.0  PLT 290.0 284  --   LABPROT 31.9* 24.5*  --   INR 3.0* 2.22  --   CREATININE 1.07 1.23* 1.00    CrCl cannot be calculated (Unknown ideal weight.).   Medications:  Scheduled:  . [START ON 12/24/2016] insulin aspart  0-9 Units Subcutaneous TID WC  . potassium chloride  40 mEq Oral Once   Infusions:   PRN:   Assessment: 83 year old female admitted with CHF exacerbation.  Patient is on chronic warfarin for history of afib.  Home dose reported as 2mg  daily.  INR on admission therapeutic at 2.2.  Hematuria noted, thought to be due to UTI.  Goal of Therapy:  INR 2-3   Plan:   Warfarin 2mg  PO x 1 today per home dose  Daily PT/INR  Peggyann Juba, PharmD, BCPS Pager: [phone] 12/23/2016,7:05 PM

## 2016-12-23 NOTE — Telephone Encounter (Signed)
Spoke with pt's son Shanon Brow (dpr on file), advised that he has already spoken with [place] as documented re: lab results.  Pt's son was calling because pt has been unable to produce a urine sample this morning.  Shanon Brow verified that pt has been "drinking a little water" this morning, noted increased swelling in pt's face.  Pt's son is unsure if they need to take pt to ED or if anything further can be recommended- states he is very worried.    SN please advise.  Thanks!

## 2016-12-23 NOTE — Progress Notes (Signed)
CRITICAL VALUE ALERT  Critical Value:  Troponin  Date & Time Notied:  12/23/16 1704  Provider Notified: Dr. Kennon Holter  Orders Received/Actions taken:

## 2016-12-23 NOTE — ED Provider Notes (Signed)
[facility] DEPT Provider Note  CSN: [id number] Arrival date & time: 12/23/16 1343  Chief Complaint(s) Dysuria; Hematuria; and Shortness of Breath  HPI Michelle Herrera is a 83 y.o. female with an extensive past medical history listed below including A. fib on warfarin, chronic diastolic/systolic heart failure on Lasix who presents to the emergency department with several days of hematuria following a likely urinary tract infection that was treated with ciprofloxacin by primary care provider.  She also endorses worsening peripheral edema and shortness of breath after stopping her Lasix due to polyuria.  Edema has been worsening over the past several days.  Her shortness of breath is exacerbated with ambulation.  Alleviated by rest.  No other alleviating or aggravating factors.  She denies any chest pain.  She is endorsing productive cough of white sputum.  She denies any abdominal pain, dysuria, nausea, vomiting.  Denies any other physical complaints.  The history is provided by a relative and the patient.    Past Medical History Past Medical History:  Diagnosis Date  . AF (atrial fibrillation) ([place])    a. Chronic Xarelto  . Anxiety state, unspecified   . Benign neoplasm of colon   . CAD (coronary artery disease)    a. 05/2012 NSTEMI/Cath/PCI: LM nl, LAD min irregs, LCX min irregs, RI 40-50 ost, OM1 nl, RCA dom, 64m(4.0x12 Vision BMS), PD/PL nl, EF 55%, at least mod MR. b. NSTEMI 03/2013 - cath with widely patent coronaries, suspect demand ischemia due to RVR.  .Marland KitchenChronic combined systolic and diastolic CHF (congestive heart failure) ([place]    a. 01/2011 Echo: EF 40-45%, diff HK, diast dysfxn, mild to mod MR, mod to sev dil LA/RV/RA, mod to sev reduced RV fxn, mod TR, PASP 438mg.;  b.  Echo 06/15/12:  EF 55%, mild MR, severe BAE, mod TR, PASP 35  . Diaphragmatic hernia without mention of obstruction or gangrene   . Epistaxis    a. s/p cauterization 11/2012.  . Marland KitchenHistory of stroke   . HTN (hypertension)   . Hx of cardiovascular stress test    a. Myoview in 2006 was negative for ischemia, EF 54%. (WMartin Majesticn to have CAD development 05/2012.)  . Hypomagnesemia   . Lumbago   . Memory loss   . Mitral regurgitation    a. mild by echo 06/2012.  . Mixed incontinence urge and stress (female)(female)   . Osteoarthrosis, unspecified whether generalized or localized, unspecified site   . Osteoporosis, unspecified   . Pancreatic abnormality 04/25/2016  . Pure hypercholesterolemia   . [place]al infarction ([place]   01/2011 in setting of low INR  . Type II or unspecified type diabetes mellitus without mention of complication, not stated as uncontrolled    Patient Active Problem List   Diagnosis Date Noted  . Hypothyroidism 12/23/2016  . Pleural effusion 12/23/2016  . Edema 12/23/2016  . UTI (urinary tract infection) 12/22/2016  . Gait abnormality 06/30/2016  . Lower abdominal pain 06/15/2016  . AKI (acute kidney injury) ([place]06/06/2016  . Diabetes mellitus with diabetic neuropathy ([facility]06/06/2016  . Renal infarction ([place]01/21/2013  . Permanent atrial fibrillation (HC[place]02/29/2012  . MEMORY LOSS 07/29/2007  . Essential hypertension 01/23/2007   Home Medication(s) Prior to Admission medications   Medication Sig Start Date End Date Taking? Authorizing Provider  acetaminophen (TYLENOL) 500 MG tablet Take 500 mg by mouth every 6 (six) hours as needed for mild pain. 03/15/13  Yes Dunn, DaNedra HaiPA-C  atenolol (TENORMIN) 50 MG tablet TAKE ONE TABLET BY MOUTH ONCE DAILY 12/31/15  Yes Evans Lance, MD  atorvastatin (LIPITOR) 40 MG tablet TAKE ONE TABLET BY MOUTH ONCE DAILY AT [facility] 03/22/16  Yes Evans Lance, MD  diltiazem (CARTIA XT) 120 MG 24 hr capsule Take 1 capsule (120 mg total) by mouth daily. 11/29/16  Yes Evans Lance, MD  famotidine (PEPCID) 20 MG tablet Take 1 tablet (20 mg total) by mouth at bedtime. 03/10/16  Yes Noralee Space, MD  furosemide (LASIX)  20 MG tablet Take 1 tablet (20 mg total) daily by mouth. 11/22/16  Yes Noralee Space, MD  glipiZIDE (GLUCOTROL) 10 MG tablet Take 1 tablet (10 mg total) by mouth 2 (two) times daily. 07/18/16  Yes Noralee Space, MD  nitroGLYCERIN (NITROSTAT) 0.4 MG SL tablet Place 1 tablet (0.4 mg total) under the tongue every 5 (five) minutes as needed for chest pain (up to 3 doses). 03/15/13  Yes Dunn, Nedra Hai, PA-C  omeprazole (PRILOSEC) 20 MG capsule Take 1 tablet by mouth 30 minutes before breakfast 03/10/16  Yes Noralee Space, MD  warfarin (COUMADIN) 4 MG tablet Take 1 tablet (4 mg total) by mouth daily. Patient taking differently: Current dose as of 6/21 is 1/2 tablet every evening 06/22/16 06/22/17 Yes Gherghe, Vella Redhead, MD  Blood Glucose Monitoring Suppl (ACCU-CHEK AVIVA PLUS) w/Device KIT Test blood sugar up to two times daily 04/25/16   Noralee Space, MD  ciprofloxacin (CIPRO) 250 MG tablet Take 1 tablet (250 mg total) by mouth 2 (two) times daily. Patient not taking: Reported on 12/23/2016 11/04/16   Noralee Space, MD  feeding supplement, ENSURE ENLIVE, (ENSURE ENLIVE) LIQD Take 237 mLs by mouth 2 (two) times daily between meals. Patient not taking: Reported on 12/23/2016 06/22/16   Orson Eva, MD  glucose blood (ACCU-CHEK AVIVA PLUS) test strip Test blood sugar up to two times daily 04/25/16   Noralee Space, MD  LANCETS ULTRA FINE MISC Test blood sugar up to two times daily 04/25/16   Noralee Space, MD  levothyroxine (SYNTHROID) 50 MCG tablet Take 1 tablet (50 mcg total) by mouth daily before breakfast. 12/23/16   Noralee Space, MD  metFORMIN (GLUCOPHAGE) 500 MG tablet Take 500 mg by mouth 2 (two) times daily with a meal.     [provider]  potassium chloride (MICRO-K) 10 MEQ CR capsule Take 2 capsules (20 mEq total) by mouth daily. 12/23/16   Noralee Space, MD  vitamin B-12 (CYANOCOBALAMIN) 1000 MCG tablet Take 1,000 mcg by mouth every morning.     [provider]                                                                                                                                     Past Surgical History Past Surgical History:  Procedure Laterality Date  . cataract surgery/both  eyes     01/2012 left eye---02/2012 right eye  . CHOLECYSTECTOMY    . LEFT HEART CATHETERIZATION WITH CORONARY ANGIOGRAM N/A 05/28/2012   Procedure: LEFT HEART CATHETERIZATION WITH CORONARY ANGIOGRAM;  Surgeon: Sherren Mocha, MD;  Location: [facility] CATH LAB;  Service: Cardiovascular;  Laterality: N/A;  . LEFT HEART CATHETERIZATION WITH CORONARY ANGIOGRAM N/A 03/14/2013   Procedure: LEFT HEART CATHETERIZATION WITH CORONARY ANGIOGRAM;  Surgeon: Sinclair Grooms, MD;  Location: [facility] CATH LAB;  Service: Cardiovascular;  Laterality: N/A;  . PERCUTANEOUS CORONARY STENT INTERVENTION (PCI-S)  05/28/2012   Procedure: PERCUTANEOUS CORONARY STENT INTERVENTION (PCI-S);  Surgeon: Sherren Mocha, MD;  Location: [facility] C[facility] LAB;  Service: Cardiovascular;;  . rt.leg surgery  11/2010   Family History Family History  Problem Relation Age of Onset  . Stomach cancer Father   . Pneumonia Mother   . CAD Brother     Social History Social History   Tobacco Use  . Smoking status: Never Smoker  . Smokeless tobacco: Never Used  Substance Use Topics  . Alcohol use: No    Alcohol/week: 0.0 oz  . Drug use: No   Allergies Peanut-containing drug products; Azithromycin; Flomax [tamsulosin hcl]; Pioglitazone; Pregabalin; and Sulfonamide derivatives  Review of Systems Review of Systems All other systems are reviewed and are negative for acute change except as noted in the HPI  Physical Exam Vital Signs  I have reviewed the triage vital signs BP 119/82   Pulse 73   Resp 20   SpO2 100%   Physical Exam  Constitutional: She is oriented to person, place, and time. She appears well-developed and well-nourished. No distress.  HENT:  Head: Normocephalic and atraumatic.  Nose: Nose normal.  Eyes: Conjunctivae and EOM  are normal. Pupils are equal, round, and reactive to light. Right eye exhibits no discharge. Left eye exhibits no discharge. No scleral icterus.  Neck: Normal range of motion. Neck supple.  Cardiovascular: Normal rate. An irregularly irregular rhythm present. Exam reveals no gallop and no friction rub.  Murmur heard.  Systolic (best at the apex) murmur is present with a grade of 2/6. Pulmonary/Chest: Effort normal and breath sounds normal. No stridor. No respiratory distress. She has no rales.  Abdominal: Soft. She exhibits no distension. There is no tenderness.  Musculoskeletal: She exhibits no edema or tenderness.  2+ bilateral lower extremity edema.  3+ left upper extremity edema.  Nonpitting edema in the right upper extremity.  Neurological: She is alert and oriented to person, place, and time.  Skin: Skin is warm and dry. No rash noted. She is not diaphoretic. No erythema.  Psychiatric: She has a normal mood and affect.  Vitals reviewed.   ED Results and Treatments Labs (all labs ordered are listed, but only abnormal results are displayed) Labs Reviewed  CBC WITH DIFFERENTIAL/PLATELET - Abnormal; Notable for the following components:      Result Value   RDW 16.7 (*)    Lymphs Abs 0.5 (*)    All other components within normal limits  COMPREHENSIVE METABOLIC PANEL - Abnormal; Notable for the following components:   Glucose, Bld 145 (*)    BUN 23 (*)    Creatinine, Ser 1.23 (*)    ALT 10 (*)    Total Bilirubin 1.5 (*)    GFR calc non Af Amer 39 (*)    GFR calc Af Amer 45 (*)    All other components within normal limits  PROTIME-INR - Abnormal; Notable for the following components:   Prothrombin  Time 24.5 (*)    All other components within normal limits  I-STAT CHEM 8, ED - Abnormal; Notable for the following components:   Potassium 3.4 (*)    BUN 21 (*)    Glucose, Bld 142 (*)    Calcium, Ion 1.14 (*)    All other components within normal limits  BRAIN NATRIURETIC PEPTIDE    URINALYSIS, ROUTINE W REFLEX MICROSCOPIC                                                                                                                         EKG  EKG Interpretation  Date/Time:  Friday December 23 2016 14:01:07 EST Ventricular Rate:  87 PR Interval:    QRS Duration: 100 QT Interval:  407 QTC Calculation: 490 R Axis:   97 Text Interpretation:  Atrial fibrillation Low voltage, extremity and precordial leads Abnormal lateral Q waves Anteroseptal infarct, old Nonspecific T abnormalities, inferior leads Otherwise no significant change Confirmed by Addison Lank [phone]) on 12/23/2016 3:37:31 PM      Radiology Dg Chest 2 View  Result Date: 12/23/2016 CLINICAL DATA:  Lower extremity swelling. EXAM: CHEST  2 VIEW COMPARISON:  12/22/2016.  02/29/2016. FINDINGS: Cardiomegaly. Bilateral effusions, larger on the left than the right, with basilar volume loss. Venous hypertension with mild interstitial edema. No acute bone finding. IMPRESSION: Fluid overload/ congestive heart failure with bilateral effusions left larger than right, dependent atelectasis, cardiomegaly and mild interstitial edema. Electronically Signed   By: Nelson Chimes M.D.   On: 12/23/2016 15:02   Pertinent labs & imaging results that were available during my care of the patient were reviewed by me and considered in my medical decision making (see chart for details).  Medications Ordered in ED Medications  furosemide (LASIX) injection 40 mg (not administered)                                                                                                                                    Procedures Procedures CRITICAL CARE Performed by: Grayce Sessions Tyrianna Lightle Total critical care time: 35 minutes Critical care time was exclusive of separately billable procedures and treating other patients. Critical care was necessary to treat or prevent imminent or life-threatening deterioration. Critical care was time  spent personally by me on the following activities: development of treatment plan with patient and/or surrogate as well as nursing, discussions with consultants, evaluation of patient's response  to treatment, examination of patient, obtaining history from patient or surrogate, ordering and performing treatments and interventions, ordering and review of laboratory studies, ordering and review of radiographic studies, pulse oximetry and re-evaluation of patient's condition.   (including critical care time)  Medical Decision Making / ED Course I have reviewed the nursing notes for this encounter and the patient's prior records (if available in EHR or on provided paperwork).    Presentation is concerning for CHF exacerbation resulting in hypoxia with saturations to 80% on room air, requiring 5 L nasal cannula.  Chest x-ray revealed pulmonary edema.  No leukocytosis or evidence of pneumonia chest x-ray.  Labs without acute renal insufficiency.  We will start the patient on IV Lasix and admit to medicine for further management.  Final Clinical Impression(s) / ED Diagnoses Final diagnoses:  Acute on chronic combined systolic and diastolic congestive heart failure ([place])      This chart was dictated using voice recognition software.  Despite best efforts to proofread,  errors can occur which can change the documentation meaning.   Fatima Blank, MD 12/23/16 [phone]

## 2016-12-23 NOTE — ED Notes (Signed)
While starting IV, patient desat to 80%, good wave form. Patient put back onto 3 L still only sat 90%, patient now on 6L 94%

## 2016-12-23 NOTE — ED Triage Notes (Signed)
Patient's son was reports that the patient went to Dr. Jeannine Kitten office yesterday because of blood in her urine and dribbling. Patient's son also reports that the patient did not take her fluid pills x 3-4 days because se did not want to urinate. Patient has swelling to her legs and left hand swelling and left side of face. Room air sats 83%. HR-83-150. Patient has SOB at rest.  Patient hs a history of atrial fib.

## 2016-12-23 NOTE — H&P (Signed)
History and Physical    Michelle Herrera XFG:[id number] DOB: 18-Sep-1930 DOA: 12/23/2016  PCP: Noralee Space, MD  Patient coming from:   I have personally briefly reviewed patient's old medical records in [place]  Chief Complaint: Shortness of breath, worsening lower extremity edema  HPI: Michelle Herrera is a 83 y.o. female with medical history significant of atrial fibrillation, on chronic anticoagulation with Coumadin, coronary artery disease, history of CVA, hypertension, mitral regurgitation, type 2 diabetes mellitus presented to the ED with worsening lower extremity edema, upper extremity edema, paroxysmal nocturnal dyspnea, shortness of breath on minimal exertion, cough productive of thick whitish sputum.  Patient also with complaints of hematuria.  Patient's son gave most of the history as per son patient with a little bit of memory loss/dementia.  Per patient's son patient was a usual normal self 5 days prior to admission.  2 days prior to admission patient states his brother called him and told him that his mother was having some hematuria and due to concerns for UTI the called PCPs office and went and saw her PCP 1 day prior to admission.  At PCPs office patient was unable to give a urine sample and as such patient unable to receive antibiotics at that time.  Due to patient's worsening shortness of breath on minimal exertion patient subsequently presented to the ED.  Patient denies any syncopal episode, no fever, no chills, no nausea, no vomiting, no chest pain, no hematemesis, questionable orthopnea.  Patient does endorse paroxysmal nocturnal dyspnea shortness of breath.  There was a question about concern for possible melena however per son not sure whether patient did indeed have some melanotic stools.  Son also states patient was significantly decreased oral intake.  Patient also noted to have stopped her Lasix about approximately 5 days prior to admission due to polyuria.   Patient denies any diarrhea or constipation, no nausea or vomiting.  ED Course: Patient seen in the ED, pregnancy metabolic profile obtained with a glucose of 145, BUN of 23, creatinine of 1.23, bilirubin of 1.5 otherwise is within normal limits.  BNP was 1582.2.  CBC was unremarkable.  INR was 2.22.  Urinalysis was pending.  Chest x-ray that was consistent with congestive heart failure with bilateral effusions left greater than right, dependent atelectasis, cardiomegaly and mild interstitial edema.  Patient given a dose of IV Lasix.[facility] were called to admit the patient for further evaluation and management.  Review of Systems: As per HPI otherwise 10 point review of systems negative.   Past Medical History:  Diagnosis Date  . AF (atrial fibrillation) ([place])    a. Chronic Xarelto  . Anxiety state, unspecified   . Benign neoplasm of colon   . CAD (coronary artery disease)    a. 05/2012 NSTEMI/Cath/PCI: LM nl, LAD min irregs, LCX min irregs, RI 40-50 ost, OM1 nl, RCA dom, 63m(4.0x12 Vision BMS), PD/PL nl, EF 55%, at least mod MR. b. NSTEMI 03/2013 - cath with widely patent coronaries, suspect demand ischemia due to RVR.  .Marland KitchenChronic combined systolic and diastolic CHF (congestive heart failure) ([place]    a. 01/2011 Echo: EF 40-45%, diff HK, diast dysfxn, mild to mod MR, mod to sev dil LA/RV/RA, mod to sev reduced RV fxn, mod TR, PASP 486mg.;  b.  Echo 06/15/12:  EF 55%, mild MR, severe BAE, mod TR, PASP 35  . Diaphragmatic hernia without mention of obstruction or gangrene   . Epistaxis    a. s/p  cauterization 11/2012.  [name] History of stroke   . HTN (hypertension)   . Hx of cardiovascular stress test    a. Myoview in 2006 was negative for ischemia, EF 54%. Martin Majestic on to have CAD development 05/2012.)  . Hypomagnesemia   . Lumbago   . Memory loss   . Mitral regurgitation    a. mild by echo 06/2012.  . Mixed incontinence urge and stress (female)(female)   . Osteoarthrosis, unspecified whether  generalized or localized, unspecified site   . Osteoporosis, unspecified   . Pancreatic abnormality 04/25/2016  . Pure hypercholesterolemia   . Renal infarction ([place])    01/2011 in setting of low INR  . Type II or unspecified type diabetes mellitus without mention of complication, not stated as uncontrolled     Past Surgical History:  Procedure Laterality Date  . cataract surgery/both eyes     01/2012 left eye---02/2012 right eye  . CHOLECYSTECTOMY    . LEFT HEART CATHETERIZATION WITH CORONARY ANGIOGRAM N/A 05/28/2012   Procedure: LEFT HEART CATHETERIZATION WITH CORONARY ANGIOGRAM;  Surgeon: Sherren Mocha, MD;  Location: [facility] CATH LAB;  Service: Cardiovascular;  Laterality: N/A;  . LEFT HEART CATHETERIZATION WITH CORONARY ANGIOGRAM N/A 03/14/2013   Procedure: LEFT HEART CATHETERIZATION WITH CORONARY ANGIOGRAM;  Surgeon: Sinclair Grooms, MD;  Location: [facility] CATH LAB;  Service: Cardiovascular;  Laterality: N/A;  . PERCUTANEOUS CORONARY STENT INTERVENTION (PCI-S)  05/28/2012   Procedure: PERCUTANEOUS CORONARY STENT INTERVENTION (PCI-S);  Surgeon: Sherren Mocha, MD;  Location: [facility] CATH LAB;  Service: Cardiovascular;;  . rt.leg surgery  11/2010     reports that  has never smoked. she has never used smokeless tobacco. She reports that she does not drink alcohol or use drugs.  Allergies  Allergen Reactions  . Peanut-Containing Drug Products Anaphylaxis and Swelling  . Atorvastatin     "MAKES HER FEEL BAD"  . Azithromycin Other (See Comments)    REACTION: pt states INTOL to ZPak  . Flomax [Tamsulosin Hcl] Swelling  . Pioglitazone Other (See Comments)    edema  . Pregabalin Other (See Comments)    REACTION: pt states INTOL to Lyrica  . Sulfonamide Derivatives Swelling    Family History  Problem Relation Age of Onset  . Stomach cancer Father   . Pneumonia Mother   . CAD Brother    Mother age 67 deceased from CHF.  Father age 44 was killed in a motor vehicle accident.  Prior to Admission  medications   Medication Sig Start Date End Date Taking? Authorizing Provider  acetaminophen (TYLENOL) 500 MG tablet Take 500 mg by mouth every 6 (six) hours as needed for mild pain. 03/15/13  Yes Dunn, Dayna N, PA-C  atenolol (TENORMIN) 50 MG tablet TAKE ONE TABLET BY MOUTH ONCE DAILY 12/31/15  Yes Evans Lance, MD  bismuth subsalicylate (PEPTO BISMOL) 262 MG/15ML suspension Take 30 mLs by mouth daily as needed for indigestion.   Yes [provider]  diltiazem (CARTIA XT) 120 MG 24 hr capsule Take 1 capsule (120 mg total) by mouth daily. 11/29/16  Yes Evans Lance, MD  furosemide (LASIX) 20 MG tablet Take 1 tablet (20 mg total) daily by mouth. 11/22/16  Yes Noralee Space, MD  glipiZIDE (GLUCOTROL) 10 MG tablet Take 1 tablet (10 mg total) by mouth 2 (two) times daily. 07/18/16  Yes Noralee Space, MD  metFORMIN (GLUCOPHAGE) 500 MG tablet Take 500 mg by mouth 2 (two) times daily with a meal.    Yes  [provider]  nitroGLYCERIN (NITROSTAT) 0.4 MG SL tablet Place 1 tablet (0.4 mg total) under the tongue every 5 (five) minutes as needed for chest pain (up to 3 doses). 03/15/13  Yes Dunn, Dayna N, PA-C  Tetrahydrozoline HCl (VISINE OP) Place 2 drops into both eyes daily as needed (DRY EYES).   Yes [provider]  warfarin (COUMADIN) 4 MG tablet Take 1 tablet (4 mg total) by mouth daily. Patient taking differently: Current dose as of 6/21 is 1/2 tablet every evening 06/22/16 06/22/17 Yes Gherghe, Vella Redhead, MD  atorvastatin (LIPITOR) 40 MG tablet TAKE ONE TABLET BY MOUTH ONCE DAILY AT 6PM Patient not taking: Reported on 12/23/2016 03/22/16   Evans Lance, MD  Blood Glucose Monitoring Suppl (ACCU-CHEK AVIVA PLUS) w/Device KIT Test blood sugar up to two times daily 04/25/16   Noralee Space, MD  ciprofloxacin (CIPRO) 250 MG tablet Take 1 tablet (250 mg total) by mouth 2 (two) times daily. Patient not taking: Reported on 12/23/2016 11/04/16   Noralee Space, MD  famotidine  (PEPCID) 20 MG tablet Take 1 tablet (20 mg total) by mouth at bedtime. Patient not taking: Reported on 12/23/2016 03/10/16   Noralee Space, MD  feeding supplement, ENSURE ENLIVE, (ENSURE ENLIVE) LIQD Take 237 mLs by mouth 2 (two) times daily between meals. Patient not taking: Reported on 12/23/2016 06/22/16   Orson Eva, MD  glucose blood (ACCU-CHEK AVIVA PLUS) test strip Test blood sugar up to two times daily 04/25/16   Noralee Space, MD  LANCETS ULTRA FINE MISC Test blood sugar up to two times daily 04/25/16   Noralee Space, MD  levothyroxine (SYNTHROID) 50 MCG tablet Take 1 tablet (50 mcg total) by mouth daily before breakfast. Patient not taking: Reported on 12/23/2016 12/23/16   Noralee Space, MD  omeprazole (PRILOSEC) 20 MG capsule Take 1 tablet by mouth 30 minutes before breakfast Patient not taking: Reported on 12/23/2016 03/10/16   Noralee Space, MD  potassium chloride (MICRO-K) 10 MEQ CR capsule Take 2 capsules (20 mEq total) by mouth daily. Patient not taking: Reported on 12/23/2016 12/23/16   Noralee Space, MD  vitamin B-12 (CYANOCOBALAMIN) 1000 MCG tablet Take 1,000 mcg by mouth every morning.     [provider]    Physical Exam: Vitals:   12/23/16 1516 12/23/16 1601 12/23/16 1700 12/23/16 1804  BP: 115/84 119/82 (!) 124/109 116/74  Pulse: 81 73 75 81  Resp: _0 (!) 21  SpO2: 94% 100% (!) 82% 98%    Constitutional: NAD, calm, comfortable.  Frail.  Cachectic. Vitals:   12/23/16 1516 12/23/16 1601 12/23/16 1700 12/23/16 1804  BP: 115/84 119/82 (!) 124/109 116/74  Pulse: 81 73 75 81  Resp: _1 (!) 21  SpO2: 94% 100% (!) 82% 98%   Eyes: PERRLA, lids and conjunctivae normal on the right.  Left eyelid with some erythema and some dryness. ENMT: Mucous membranes are dry. Posterior pharynx clear of any exudate or lesions.Poor dentition.  Neck: normal, supple, no masses, no thyromegaly.  Positive JVD Respiratory: Decreased breath sounds in the bases.   Scattered crackles.  No wheezing.  Occasional coarse breath sounds. Cardiovascular: Irregularly irregular.  2+ bilateral lower extremity edema up to mid thighs.  2+ pulses. Abdomen: no tenderness, no masses palpated. No hepatosplenomegaly. Bowel sounds positive.  Musculoskeletal: no clubbing / cyanosis. No joint deformity upper and lower extremities. Good ROM, no contractures. Normal muscle tone.  Skin: no rashes,  lesions, ulcers. No induration Neurologic: CN 2-12 grossly intact. Sensation intact, DTR normal. Strength 3/5 bilateral lower extremities.  4/5 strength bilateral upper extremities.   Psychiatric: Fair judgment and insight. Alert and oriented x 3. Normal mood.  Labs on Admission: I have personally reviewed following labs and imaging studies  CBC: Recent Labs  Lab 12/22/16 1109 12/23/16 1558 12/23/16 1608  WBC 6.5 8.2  --   NEUTROABS 5.5 6.8  --   HGB 12.9 12.6 14.3  HCT 39.3 37.0 42.0  MCV 97.1 94.1  --   PLT 290.0 284  --    Basic Metabolic Panel: Recent Labs  Lab 12/22/16 1109 12/23/16 1558 12/23/16 1608  NA 142 142 143  K 3.4* 4.4 3.4*  CL 101 103 102  CO2 28 25  --   GLUCOSE 161* 145* 142*  BUN 18 23* 21*  CREATININE 1.07 1.23* 1.00  CALCIUM 8.9 9.1  --    GFR: CrCl cannot be calculated (Unknown ideal weight.). Liver Function Tests: Recent Labs  Lab 12/22/16 1109 12/23/16 1558  AST 14 35  ALT 9 10*  ALKPHOS 78 90  BILITOT 1.3* 1.5*  PROT 6.5 6.8  ALBUMIN 3.8 3.5   No results for input(s): LIPASE, AMYLASE in the last 168 hours. No results for input(s): AMMONIA in the last 168 hours. Coagulation Profile: Recent Labs  Lab 12/22/16 1109 12/23/16 1558  INR 3.0* 2.22   Cardiac Enzymes: No results for input(s): CKTOTAL, CKMB, CKMBINDEX, TROPONINI in the last 168 hours. BNP (last 3 results) Recent Labs    12/22/16 1109  PROBNP 2,994.0*   HbA1C: Recent Labs    12/22/16 1109  HGBA1C 6.9*   CBG: No results for input(s): GLUCAP in the last  168 hours. Lipid Profile: No results for input(s): CHOL, HDL, LDLCALC, TRIG, CHOLHDL, LDLDIRECT in the last 72 hours. Thyroid Function Tests: Recent Labs    12/22/16 1109  TSH 8.12*   Anemia Panel: No results for input(s): VITAMINB12, FOLATE, FERRITIN, TIBC, IRON, RETICCTPCT in the last 72 hours. Urine analysis:    Component Value Date/Time   COLORURINE Red (A) 12/05/2016 1025   APPEARANCEUR Cloudy (A) 12/05/2016 1025   LABSPEC 1.025 12/05/2016 1025   PHURINE 5.5 12/05/2016 1025   GLUCOSEU NEGATIVE 12/05/2016 1025   HGBUR LARGE (A) 12/05/2016 1025   BILIRUBINUR NEGATIVE 12/05/2016 1025   KETONESUR TRACE (A) 12/05/2016 1025   PROTEINUR NEGATIVE 06/20/2016 0950   UROBILINOGEN 2.0 (A) 12/05/2016 1025   NITRITE POSITIVE (A) 12/05/2016 1025   LEUKOCYTESUR MODERATE (A) 12/05/2016 1025    Radiological Exams on Admission: Dg Chest 2 View  Result Date: 12/23/2016 CLINICAL DATA:  Lower extremity swelling. EXAM: CHEST  2 VIEW COMPARISON:  12/22/2016.  02/29/2016. FINDINGS: Cardiomegaly. Bilateral effusions, larger on the left than the right, with basilar volume loss. Venous hypertension with mild interstitial edema. No acute bone finding. IMPRESSION: Fluid overload/ congestive heart failure with bilateral effusions left larger than right, dependent atelectasis, cardiomegaly and mild interstitial edema. Electronically Signed   By: Nelson Chimes M.D.   On: 12/23/2016 15:02   Dg Chest 2 View  Result Date: 12/22/2016 CLINICAL DATA:  Bilateral lower extremity swelling. EXAM: CHEST  2 VIEW COMPARISON:  Radiographs of February 29, 2016. FINDINGS: Stable cardiomegaly. Atherosclerosis of thoracic aorta is noted. No pneumothorax is noted. Interval development of moderate left pleural effusion with probable underlying atelectasis or infiltrate. Mild right basilar atelectasis or infiltrate is noted. Bony thorax is unremarkable. IMPRESSION: Moderate left pleural effusion with probable  underlying  atelectasis or infiltrate. Mild right basilar atelectasis or infiltrate is noted. Electronically Signed   By: Marijo Conception, M.D.   On: 12/22/2016 13:09    EKG: Independently reviewed.  A. fib, low voltage, nonspecific ST-T wave abnormalities.  Assessment/Plan Principal Problem:   Acute on chronic combined systolic and diastolic CHF (congestive heart failure) ([facility]) Active Problems:   Essential hypertension   Memory loss   Permanent atrial fibrillation ([facility])   Diabetes mellitus with diabetic neuropathy ([facility])   Hypothyroidism   Edema   Hematuria   #1 acute on chronic combined systolic and diastolic heart failure Patient presented with worsening shortness of breath, worsening lower extremity edema, productive cough of whitish yellowish sputum, elevated BNP and chest x-ray with concerns for volume overload.  Likely secondary to medical noncompliance as per son patient did not take her diuretics for approximately 5 days secondary to polyuria.  Patient denies any chest pain.  EKG with poor voltage, A. fib, some nonspecific T wave changes.  Will admit the patient to telemetry.  Cycle cardiac enzymes every 6 hours x3.  Check a 2D echo.  Placed on Lasix 40 mg IV every 12 hours.  Strict I's and O's.  Daily weights.  Continue home regimen of atenolol, Cardizem.  Follow.  2.  Hypertension Continue home regimen of atenolol and Cardizem.  3.  Atrial fibrillation Patient currently rate controlled.  Continue home regimen of atenolol and Cardizem.  Coumadin for anticoagulation.  4.  Hematuria UA pending.  Urine cultures pending.  Follow for now and monitor closely while on anticoagulation.  5.  Diabetes mellitus II Check a hemoglobin A1c.  Hold oral hypoglycemic agents.  Sliding scale insulin.  6.  Hyperlipidemia Continue statin.  7.  Hypothyroidism Patient noted not to be taking his Synthroid.  Check a TSH.  Continue home dose regimen of 50 MCG's daily.  8.  Gastroesophageal reflux  disease PPI.  DVT prophylaxis: Coumadin Code Status: DNR Family Communication: Updated patient and son who is healthcare power of attorney at bedside. Disposition Plan: to be Determined Consults called: None Admission status: Admit to telemetry.   Irine Seal MD [facility] Pager [phone]  If 7PM-7AM, please contact night-coverage [email or web address] Password [facility]  12/23/2016, 6:46 PM

## 2016-12-23 NOTE — Telephone Encounter (Signed)
Pt's son returned call this morning and I went off of previous message and told him he could expect a call from Dr. Lenna Gilford today.-tr

## 2016-12-23 NOTE — Progress Notes (Signed)
Subjective:    Patient ID: Michelle Herrera, female    DOB: 03/08/1930, 83 y.o.   MRN: [id number]  HPI 83 y/o WF here for a follow up visit...  SEE PREV EPIC NOTES FOR OLDER DATA >>      Adm 1/13 w/ renal infarcts felt due to micro-emboli & inadeq INR ==> changed to [place].  Hosp 5/14 for NSTEMI=> stent to RCA.  LABS 10/14:  FLP- looks good on Lip40;  Chems- ok x BS=181, A1c=8.8.Marland KitchenMarland Kitchen  Hosp 3/15 w/ NSTEMI & cath showed widely patent coronaries & EF=50%.  LABS 4/15:  Chems- ok x BS=181, A1c=9.3;  LFTs- wnl;  CBC- wnl;  Fe=121 (31%sat);    2DEcho 05/2013 showed mild LVH, EF=50-55%, mild AI, bileaflet MVP w/ mod MR, severe biatrial enlargement, PFO suspected, severe TR, PAsys=683mHg  ADDENDUM>> she notes that she has no appetite & "not eating"; she has lost 21# in 661moown to 109# & BMI=18; but serum proteins WNL; we will treat w/ MEGACE '200mg'$  before meals Tid... ~  October 23, 2013:  BeAssuntaas decreased both of her DM meds in half as she says she doesn't tolerate the max doses due to "feeling bad, GI upset, & diarrhea";  She has DM w/ neuropathy> prev on Metform500-2Bid & Glipiz10-2Bid but INTOL she says & decreased to 2 of each Qam only; prev followed by DrEllison but last seen 6/13- Actos stopped due to edema; won't take additional meds due to cost; refuses insulin shots; she claims that BS higher when her knee is hurting; Labs 10/15 showed BS=172, A1c=8.4 & asked to ADD Metform500 & Glipiz10 (one of each) in PM at dinner => NOTE: she did NOT incr the Metform & glipizide as requested... ~  April 24, 2014:  she REFUSED referral to Endocrinology for DM mamagement, refuses to increase her DM meds per my recommendations, refuses to start insulin therapy; she says that she will continue current meds and get on diet + exercise to get her BS improved & A1c better... ~  October 24, 2014:  Reminded to take meds regularly & again asked to bring all med bottles to every office visit; she continues to  refuse to increase her DM meds or go to an endocrinologist for DM management; Her CC is the back pain but Tramadol50 makes her too groggy- rec to take 1/2 tab + Tylenol, up to Tid as needed...    ~  April 23, 2015:  [place]/ a tooth pulled 03/19/15- developed some bleeding that wouldn't stop & went to ER (she is on Xarelto- held that one day); treated w/ gauze & a tea-bag w/ control...     She had f/u [facility]- DrTaylor 12/18/14> note reviewed- chr AFib, HBP, CAD s/p stent to RCA 2014, combined sys & diast CHF, valv dis w/ mod MR/ severe TR/ and mod PulmHTN (66m62m; no CP, chrDOE (class2); Same meds- on Xarelto, Aten50, Cardizem120, Lasix40, & no changes...     HBP> on Aten50, Diltiazem120 & Lasix20-2Qam; BP= 138/86 & she denies CP/ ch in SOB/ edema, notes occas palpit...    CAD, Cardiomyopathy (sys & diast CHF), Valvular Dis w/ MR etc> off ASA81 due to epistaxis, on Xarelto15 (lowered dose due to nose bleed) & above meds; Adm 5/14 by [facility] w/ NSTEMI, Cath=> stent to 95%RCA; followed by DrTaylor; Adm 1/15 & 3/15 w/ AFib, RVR, NSTEMI due to demand ischemia and recath w/ widely patent coronaries, EF=50%; meds adjusted...Marland KitchenMarland Kitchen  AFib, CHB & Pacer> on Xarelto at 15 (due to epistaxis); followed by DrTaylor & seen 12/16> cardiology notes are reviewed...    Chol> on Lip40; weight is up to 128#, BMI=21; FLP 4/16 shows TChol 214, TG 106, HDL 86, LDL 107 & reminded to take med every day!    DM w/ neuropathy> on Metform500Bid & Glipiz10-2Bid but ?what she is taking (didn't brink bottles); prev followed by DrEllison- Actos stopped due to edema; won't take additional meds due to cost; refuses insulin shots; she claims that BS higher when her knee is hurting; Labs 4/16 showed BS=175, A1c=9.4;  Labs 4/17 showed BS=268, A1c=10.6;  I confirmed w/ [facility] ([name]) that pt is NOT filling meds regularly (Metform= 04/04/15 for 30d supply & 01/20/15 prior to that;  Glipizide= 02/26/15 for 30d supply  & 12/11/14 prior to that);  We will again try to refer to Endocrine (she has refused mult times).    GI- HH, colon polyps, s/p GB> stable she says, not on regular PPI dosing, last colon was 2003 by DrStark w/ 56m polyp & divertics; denies abd pain, n/v, d/c, blood seen...    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     DJD, LBP, right shoulder pain> on Neurontin100Tid; Ortho evals from DrNorris/ [facility]- prev knee surg for torn cartilage, back discomfort treated w/ Tramadol & Tylenol...    Osteoporosis, Vit D defic> Vit D level 2013 = 30 and she was asked to incr her OTC Vit D supplement to 2000u daily; needs f/u BMD but she wants to wait...    Memory Loss> Hx 2 sm infarcts on prev MRI, on Xarelto from [facility]; she declines Aricept etc...    Anxiety> mod stress in the family, she declines anxiolytic meds...    B12 defic> on OTC oral B12 supplement 1000u daily...    Medication non-compliance>  This is well documented at each & every office visit... EXAM shows Afeb, VSS, O2sat=98% on RA;  HEENT- dental problems, mallampati1;  Chest- clear w/o w/r/r;  Heart- Irreg Afib, gr1-2/6 DEM w/o r/g heard;  Abd- soft, nontender, neg;  Ext- VI, w/o c/c/e;  Neuro- intact w/o focal neuro deficits...  LABS 04/23/15>  FLP- not at goals, not taking meds regularly;  Chems- ok x BS=268, A1c=10.6;  CBC- wnl;  Thyroid=3.51;  Reminded to take all meds every day! IMP/PLAN>>  BAlizzonis still NOT taking her meds regularly- asked to fill all Rx every month & take all meds as prescribed every day!  We will attempt to get her to see DrGherghe at [facility] (she once again refused...   ~  October 26, 2015:  624moOV & Lavere reports that she is doing well, feeling good, & no new complaints or concerns;  She  denies CP, palpit, edema; states her breathing is OK & denies cough, sput, SOB; it is apparent that she is still NOT exercising at all... we reviewed the following medical problems during today's office visit >>     BP & cardiac controlled w/ Aten50, [id number], Xarelto15; BP=124/82 today & she remains asymptomatic...    Chol treated w/ diet + Lip40 but compliance is questioned; she did not bring pill bottles or med list to todays OV; FLScappoose/17 showed TChol 216, TG 115, HDL 80, LDL 113 & reminded to take  med every day...    DM treated w/ Metform + Glipizide but she doesn't take what is prescribed- admits to taking Metform500-2Qam & Glipiz10-2Qam but no PM doses; Labs today showed     GI/GU- stable, she wears depends & take Ditropan due to her incont but this system is working satis for her...    She has diffuse DJD, LBP, osteoporosis etc; on Neurontin, Tramadol, Tylenol prn... EXAM shows Afeb, VSS, O2sat=97% on RA;  HEENT- dental problems, mallampati1;  Chest- clear w/o w/r/r;  Heart- Irreg Afib, gr1-2/6 DEM w/o r/g heard;  Abd- soft, nontender, neg;  Ext- VI, w/o c/c/e;  Neuro- intact w/o focal neuro deficits...  LABS 10/26/15>  Chems- ok x BS=296, A1c=10.6 IMP/PLAN>  Merisa continues to treat herself by taking the meds as she wants & not as prescribed; she has also tied my hands as she will not take any of the newed (more expensive) DM meds and wil not agree to insulin rx; finally I have tried to refer her to endocrine/ DM specialist but she has repeatedly refused to go; we reviewed low carb/ DM diet & rec to take the meds as follows- Metform500- 2Qam & 1Qpm plus the Glipizide10-2Qam & 1Qpm as well... She has again declined my offer to send her to a DM specialist for their review... Note: >50% of this 25 min appt was spent in counseling & coordination of care.  ~  March 10, 2016:  4-6moROV & [facility] by [facility] w/ left CP radiating to the epigastrium & worse supine; ER eval  revealed EKG w/ Afib & NSSTTWA, CXR w/ cardiomeg & some hyperinflation w/ chr changes, LABS ok x BS=288 & troponins neg, UTI (?pyelo) w/ tntc WBCs=> EColi resist to Quinolones (Rx Rocephin=>Keflex);  CT Abd revealed mult findings (see below) + 2 lucent lesions in the pancreas up to 180msize & [facility] did not evaluate further, rather they disch pt to get the needed MRI Abd as an outpt after discharge...     Pt was last seen by [facility], DrTaylor 10/14/15>  HBP, chr AFib, CAD- s/p stent in RCA 2014;  Last 2DEcho in 2015 revealed numerous abnormalities (EF=50-55%, valvular heart dis, severe biatrial enlargement?PFO, severe TR & PAsys=4729m);  They did not rec any changes to meds & asked to f/u 46yr105yrNOTE: pt is on Xarelto15 per DrTaylor but she has never purchased this med- she only takes it if she get samples from the [facility] office    BP & cardiac treated w/ Aten50, [id number], Xarelto15, she is off the Lasix; BP=134/72 today & she notes the chest discomfort, SOB/DOE, denies edema...    Chol treated w/ diet + Lip40 but compliance is questioned; she did not bring pill bottles or med list to todays OV; FLP [place]7 showed TChol 216, TG 115, HDL 80, LDL 113 & reminded to take med every day...    DM treated w/ Metform1000Bid + Glipizide10-2Bid but she doesn't take what is prescribed- admits to taking Metform500-2Qam & Glipiz10-2Qam but no PM doses; she refuses Endocrine referral, refuses to change meds or take insulin shots; LABS 02/2016 showed BS=164, A1c=11.7 AND SHE AGAIN REFUSES ALL INTERVENTIONS on her behalf...    GI/GU- stable, she wears depends & take Ditropan due to her incont but this system is working satis for her...    She has diffuse DJD, LBP, osteoporosis etc; on Neurontin, Tramadol, Tylenol prn... EXAM shows Afeb, VSS, O2sat=99% on RA;  HEENT- dental problems, mallampati1;  Chest- clear w/o w/r/r;  Heart- Irreg Afib, gr1-2/6 SEM w/o r/g heard;  Abd- soft, nontender, neg;  Ext- VI, w/o c/c/e;  Neuro-  intact w/o focal neuro deficits...  CXR 02/29/16 (independently reviewed by me in the PACS system) showed stable cardiomeg, tortuous Ao, chronic lung changes- NAD.Marland KitchenMarland Kitchen  EKG 02/29/16 showed Afib, WNIO27-03, RAD, NSSTTWA...  CT Abd&Pelvis 03/01/16 showed cortical hypoattenuation of the right kidney, mult cortical scars bilaterally (likely prior ischemia or infection), punctate nonobstructing left renal stone; 2 lucent lesions in pancreas body & tail measuring up to 5m=> rec MRIw/ contrast; extensive divertics, severe aortic calcif, severe right ventric & atrial enlargement,  smal bilat inguinal & femoral hernias containing fat...  2DEcho 03/01/16> AFib, norm LV size w/ EF=55% & no RWMA, modMR, severeTR, severe biatrial enlargement, mod RV dilation w/ decr sys function & mod pulmHTN w/ PAsys=563mg...  LABS 02/2016>  Chems- ok x BS=164, A1c=11.7;  CBC- wnl x wbc=14.7;  UTI w/ EColi resist to Cipro, sens to keflex... IMP/PLAN>>  BeNyasiaas serious multisystem dis- CARDIAC, Pulm, DM, GI/GU, etc; she is her own worst enemy as she won't take meds regularly, refuses additional consults eg-Endocruine/DM, refuses to change her meds or consider insulin shots,  Most recent Hosp for CP=> Abd pain showed abnormal 2DEcho & abn CT Abd as noted;  Radiology & [facility] hospitalists want usKoreao order MRI abd w/ contrast to f/u on the pancreatic lesions & we will comply; Pt rec to take Prilosec & Pepcid...  ADDENDUM>>  MRI Abd, MRCP w/ contrast 03/18/16>>  Tiny benign appearing cystic lesions in the pancreas, largest=1240mize in the uncinate process; incidental 1cm left adrenal adenoma, no suspicious renal lesions, atherosclerosis of the Ao, no lymphadenopathy...  Pt informed that this looks benign... NOTE: Pt is sure her abd discomfort is coming from her DM meds => needs Endocrine consult for DM management...  ~  April 25, 2016:  6wk ROVNacos a no show for her DM appt w/ DrEllison- actually she showed up 3/23 for her 3/22  appt & when they offered to resched she said "forget it";  She was similarly a "no show" for her Afib clinic appt 3/6 & SHEEleanorWe gave a copy of her Med List to her son DavShanon [email or web address])...     Today she returns to the office by herself feeling fine- no complaints- didn't bring meds to the office, didn't bring list, "just what my son gives me" she says & this makes her care exceedingly difficult...    HBP, CAD, sys&diast CHF, valv hrt dis w/ MR, Afib, heart block=>pacer>  Followed by DrTaylor on XarEarnstine Regalhe is out "they won't give me no more"), Aten50, CardizemCD120, Losartan25; ?what she is taking- EKG today w/ Afib controlled VR w/ HR=85, NSSTTWA;  She needs f/u appt in AFib clinic...    Chol> supposed to be on Lip40; last FLP 4/17 showed TChol 216, TG 115, HDL 80, LDL 113; we reviewed low chol/ low fat diet & medication compiance...    DM> as noted she never saw DrEllison & has refused to take any other meds or insulin shots for me- on Metform500-2Bid & Glucotrol10Bid; last labs showed A1c=11.7 (02/29/16) and BS=340 (03/12/16); we will resched w/ Endocrine.    GI/GU- 50m36mstic lesion of pancreas on MRI; she wears depends & take Ditropan due to her incont but this system is working satis for her...    She has diffuse DJD, LBP, osteoporosis  etc; on Neurontin, Tramadol, Tylenol prn... EXAM shows Afeb, VSS, O2sat=99% on RA;  HEENT- dental problems, mallampati1;  Chest- clear w/o w/r/r;  Heart- Irreg Afib, gr1-2/6 SEM w/o r/g heard;  Abd- soft, nontender, neg;  Ext- VI, w/o c/c/e;  Neuro- intact w/o focal neuro deficits...  EKG 04/25/16 showed AFib, rate85, NSSTTWA, NAD...   LABS 04/25/16>  BS= 174 today in the office... IMP/PLAN>>   I stressed to Giara the importance of her specialty clinics including AFib clinic w/ consideration of Coumadin anticoag instead of Xarelto which is unaffordable for her; also the Endocrine/ DM cilinic w/ DrEllison to  effect better control of her DM...   ~  June 30, 2016:  58moROV & post hosp check>  [place]rtesia[place] [facility] - 06/22/16 by [facility] after presenting w/ abd discomfort w/ radiation to her back x several weeks; pain was sharp severe & persistent,  CT Abd was suspicious for embolic dis to the right kidney w/ renal infarction => as prev well documented, the patient was supposed to be on Xarelto20 from [place] her AFib but SHE REFUSED TO TAKE THE XARELTO UNLESS PROVIDED w/ SAMPLES FROM HIS OFFICE, she had been out of this med for ?wks ?months; they placed her on Heparin=> [place] u[place] manage for her... The other problem/ [place] in [place]- A1c was 11.7 in 2/18 & in [place]he [place] easily managed & f/u A1c=6.2?!*  Since disch son says that sugars have been high >300 on their check=> we will re-start Metform'500mg'$ Bid...  We reviewed the following medical problems during today's office visit >>     HBP> on Aten50, Diltiazem120 & off prev Losar & Lasix; BP= 110/72 & she denies CP/ ch in SOB/ edema, notes occas palpit...    CAD, Cardiomyopathy (sys & diast CHF), Valvular Dis w/ MR etc>     AFib, CHB & Pacer>  off ASA81 due to epistaxis, prev on Xarelto15 but compliance very poor; Adm 5/14 by [facility] w/ NSTEMI, Cath=> stent to 95%RCA; followed by DrTaylor; Adm 1/15 & 3/15 w/ AFib, RVR, NSTEMI due to demand ischemia and recath w/ widely patent coronaries, EF=50%; meds adjusted;  Adm 6/18 w/ abd pain from renal infarction=> started back on Coumadin & sent to uKoreato manage this!    He last saw DrTaylor 10/14/15 & she missed AFib clinic appt 03/15/16> chr AFib, HBP, CAD-s/p stent in RCA 2014; no angina, BP controlled, AFib w/ controlled VR; pt asked to f/u 117yr.    Chol> on Lip40; weight is down to 114#, BMI=20; FLP 4/17 shows TChol 216, TG 115, HDL 79, LDL 113 & med compliance is suspect- reminded to take med every day!    DM w/ neuropathy> prev on Metform500Bid & Glipiz10-2Bid but ?what she  is taking (didn't brink bottles); prev followed by DrEllison- Actos stopped due to edema; won't take additional meds due to cost; refuses insulin shots; she claims that BS higher when her knee is hurting; Labs 4/16 showed BS=175, A1c=9.4;  Labs 4/17 showed BS=268, A1c=10.6; Labs 2/18 showed BS=288, A1c=11.7;  I confirmed w/ [facility] ([name]) on mult occas that she is NOT filling her 2 meds regularly!  We will again try to refer to Endocrine (she has refused mult times)=> she "no showed" for appt w/ DrEllison 03/31/16;  Post Hosp 6/18> BS was easily controlled in hosp & BS=136-219, A1c=6.2;  She was disch on Glipizide10Bid, off Metformin but son reports BS>300 at  home & we will re-start Metform500Bid...    GI- HH, colon polyps, s/p GB> stable she says, not on regular PPI dosing, but disch 6/18 on Pepcid20Qhs; last colon was 2003 by DrStark w/ 81m polyp & divertics; denies abd pain, n/v, d/c, blood seen...    Urinary incont & Hx renal infarction, ?bilat hydroneph on MRI spine > ?followed by DrEskridge (last note 2015)- hx renal infarct 1/13, bilat extrarenal pelvis, & left peri-pelvic cysts, small bladder capacity, known cystocele, trial of Myrtetriq/Detrol was unsuccessful; DrEskridge reviewed & no hydro seen; Hx AFib & supposed to be on Xarelto per DrTaylor, pt refuses to take this med unless given samples by [facility] & they ran out=> pt went un-anticoagulated & developed abd pain 06/2016=> Hosp w/ renal infarction & improved back on COUMADIN anticoag...    DJD, LBP, right shoulder pain> on Neurontin100Tid; Ortho evals from DrNorris/ [facility]- prev knee surg for torn cartilage, back discomfort treated w/ Tramadol & Tylenol...    Osteoporosis, Vit D defic> Vit D level 2013 = 30 and she was asked to incr her OTC Vit D supplement to 2000u daily; needs f/u BMD but she wants to wait...    Memory Loss, MCI, Dementia> Hx 2 sm infarcts on prev MRI, prev on Xarelto from [facility] but as noted compliance was poor; she  declines Aricept etc...    Anxiety> mod stress in the family, she declines anxiolytic meds...    B12 defic> on OTC oral B12 supplement 1000u daily; Epic labs showed B12=135 in 2011 and improved on recheck in 2013 to 905...    Medication non-compliance>  This is well documented at each & every office visit; Also requested to bring all med bottles to each visit & she NEVER does!... EXAM shows Afeb, VSS, O2sat=96% on RA;  HEENT- dental problems, mallampati1;  Chest- clear w/o w/r/r;  Heart- Irreg Afib, gr1-2/6 SEM w/o r/g heard;  Abd- soft, nontender, neg;  Ext- VI, w/o c/c/e;  Neuro- intact w/o focal neuro deficits...  Last 2DEcho 03/01/16> AFib, norm LV size w/ EF=55% & no RWMA, modMR, severeTR, severe biatrial enlargement, mod RV dilation w/ decr sys function & mod pulmHTN w/ PAsys=536mg...  Last EKG 04/25/16 showed AFib, rate 85, IVCD and NSSTTWA...  CT Abd/Pelvis 06/15/16>  Interval development of segmental areas of non-perfusion in both kidneys suggesting embolic dis; stable cystic lesion in the pancreas; Abd Ao atherosclerosis; left colon divertics...   LABS 06/2016 in Epic>  Chems- ok w/ BS=136-206, Cr=1.0-1.2;  CBC- ok x Hg=10.6-11.8 IMP/PLAN>>  BeMarisahresents w/ a complex/ challenging management problem- her mental status & that of her caregiver son (SThompson Caulwill not allow them to supervise her meds=> this will have to fall on her son- DaShanon Browho works at [employer]. For now we will require Protimes in our office every Monday AM & we will call DaShanon Brow/ changes in her med (so he can place them in her Week-sized pill case... For the DM we reviewed diet & rec that BeInez Catalinae-start Metform500 Bid + continue the Glipizide '10mg'$ Bid... We plan rov recheck in 2wks... Note:  >50% of this 60110mov was devoted to counseling & coordination of care...   ~  July 14, 2016:  2wk ROVPalays she is well- feeling good & denies SOB, cough/ sputum, CP/ palpit, abd pain/ n/v/ etc...  [name]   1) She does c/o some mild  leg swelling & it is clear that she does NOT restrict salt/sodium in any way-- we  discussed the need to elim salt from diet/ elev legs/ wear support hose;  She is not on a diuretic yet but her BP= 110/70 on Aten50 + Cardizem120...    2) She also brought a note from son Shanon Brow- asking if Gabapentin could cause weakness/ tiredness/ confusion because he says she was NOT taking this before the [place] it was started on disch from the [place] (we had it on her list for a long time but she apparently wasn't taking it);  I inquired about neuropathy symptoms and she denies leg pain, burning, nocturnal discomfort etc "I sleep good all night" she says.Marland KitchenMarland Kitchen    3) Son Product/process development scientist) reports that he home BS was >300 after disch from [place] (on [place] alone);  He called [place] & we re-started her Metform500Bid;  He notes slowly improving BS readings betw 100-150 now on the 2 meds=> we discussed rechecking labs today...    4) Finally the Coumadin plan has been initiated- Shorty willl bring her to the [place] office lab every Monday for a PROTIME & we will call son Shanon Brow w/ the result & rec for the coming week so he can adjust the pills in her weekly pill case EXAM shows Afeb, VSS, O2sat=97% on RA;  HEENT- dental problems, mallampati1;  Chest- clear w/o w/r/r;  Heart- Irreg Afib, gr1-2/6 SEM w/o r/g heard;  Abd- soft, nontender, neg;  Ext- VI, w/o c/c/e;  Neuro- intact w/o focal deficits.  LABS 07/11/16>  Protime=15.2/ INR=1.4 on Coumadin '4mg'$  tabs taking 1/2 tab daily => we directed son Shanon Brow to incr to 1tab on MWF, and 1/2 tab on TThSS w/ recheck in 1wk...  LABS 07/18/16> Chems- ok w/ BS=129, A1c=7.0;  CBC- ok w/ Hg=11.8, mcv=97;  TSH=6.82 IMP/PLAN>>  Inez Catalina & son are instructed to elim salt/sodium from the diet, elev legs, wear support hose, and we are holding off on diuretics due to soft BP;  I also rec that she STOP the Gabapentin to see if her symptoms of weakness, tiredness, & memory get better esp since we are told she was NOT taking  it prior to [place] as we thought she was (she NEVER brought med bottles to the OVs);  Plus she says she does not have neuropathic symptoms- pain, burning, numbness, etc... BS checks at home are better on her combo of Metform500Bid + Glipiz10Bid & we are checking A1c again);  Finally we have adjusted her Coumadin based on her weekly Protimes via [facility] lab protocol... We plan ROV recheck in 43mo Note: >50% of this 243m ROV was spent in counseling & coordination of care...   ~  August 22, 2016:  93m65moV & Vylet's son shoThompson Cauls been bringing her into our lab every Monday morn for a Protime check- we call her son DavShanon Brow the result 7 rec for changing the Coumadin dose;  She is on COUMADIN '4mg'$  tabs ([facility] & currently dosed w/ 1/2 tab daily=> PT/INR today is 17.4/ 1.6 and we rec sl incr dose to 1 tab Q-Monday & 1/2 tab daily x6d (TWThFSS) w/ repeat protime next week...    BetSukiports feeling OK 7 notes the Protime checks Q-Mon & medication control by son DavShanon Brow working for her...    HBP, CAD, cardiomyop, valv heart dis, AFib, CHB, pacer=> stable on Coumadin Aten50, Cardizem120; BP=110/62, pulse=83 & sl irreg, she denies CP/ palpit/ ch in SOB/ dizzy/ syncope/ & notes 1+LE edema (advised no salt)  Chol is controlled on Atorva40; last FLP was 4/17 & she cannot remember to come in FASTING for f/u FLP blood test...    DM w/ neuropathy> poor control but Queenie will only agree to take Metform500Bid & Glucotrol10Bid, she refuses more expensive next-step pills & any injectable meds!  She declined appt w/ DM/ Endocrine; we have documented poor compliance w/ meds prev but now admin by son Shanon Brow; note that BS is generally easy to control in [facility]- last [facility] 06/2016 w/ [id number], A1c=6.2>  Labs 7/18 here w/ BS=129, A1c=7.0.Marland KitchenMarland Kitchen    Urinary incont, hx renal infarction> she knows to take the coumadin as prescribed; renal function has been ok...    DJD, osteoporosis, VitD defic> she  has declined f/u BMD measurement, advised to take Advil prn, WomensMVI, VitD2000    Memory loss, dementia, abn MRI w/ 2 s, infarcts> she declines Aricept rx...  EXAM shows Afeb, VSS, O2sat=98% on RA;  HEENT- dental problems, mallampati1;  Chest- clear w/o w/r/r;  Heart- Irreg Afib, gr1-2/6 SEM w/o r/g heard;  Abd- soft, nontender, neg;  Ext- VI, w/o c/c/e;  Neuro- intact w/o focal deficits.  LABS 08/22/16>  PT=17.4, INR=1.6 on Coumadin'4mg'$  - 1/2 tab daily... IMP/PLAN>>  We decided to incr the Coumadin slightly by taking one tab ('4mg'$ ) on Mon & 1/2 tab the other 6d... She will continue w/ the weekly protimes for now & we are following; continue Med Monitoring via pt'a son Shanon Brow...    ~  December 22, 2016:  98moROV & BMattisonlook rough- son "SThompson Caul reports that she hasn't taken her Lasix for ?1wk & has developed swelling in her hands and feet, c/o ringing in her ears, redness about her left eye, 7 some loose stools;  She is here in a wheelchair, uses a walker at home they say & hasn't beenup exercising with the inclement weather;  For her part BJaleysays "feeling OK", says "something going thru my ears w/ ringing", and refused her lasix due to incr urination- noted some blood in urine (almost certainly due to her coumadin);  She notes sl cough, small amt yellow sput, no hemoptysis, SOB/DOE & tires easily but no CP/ palpit/ f/c/s... I rec to them that she seek a full rapid eval in the ER (poss adm) but she prefers for me to check studies here today-- CXR, LABS, Urine (couldn't void for u[place]...    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 They did not rec any med changes, asked to elim sodium, follow up planned 196yr.    She called usKoreaeveral weeks ago w/ ?UTI=> we treated empirically w/ Augmentin875Bid &  adjusted her Coumadin down (despite INR-2.2) We reviewed the following medical problems during today's office visit >>     HBP> on Aten50, Diltiazem120 & off prev Losar & Lasix; BP= 110/72 & she denies CP/ ch in SOB/ edema, notes occas palpit...    CAD, Cardiomyopathy (sys & diast CHF), Valvular Dis w/ MR etc>     AFib, CHB & Pacer>  off ASA81 due to epistaxis, prev on Xarelto15 but compliance very poor; Adm 5/14 by Cards w/ NSTEMI, Cath=> stent to 95%RCA; followed by DrTaylor; Adm 1/15 & 3/15 w/ AFib, RVR, NSTEMI due to demand ischemia and recath w/ widely patent coronaries, EF=50%; meds adjusted;  Adm 6/18  w/ abd pain from renal infarction=> started back on Coumadin & sent to [place] to manage this!    He last saw DrTaylor 10/14/15 & she missed AFib clinic appt 03/15/16> chr AFib, HBP, CAD-s/p stent in RCA 2014; no angina, BP controlled, AFib w/ controlled VR; pt asked to f/u 34yr..    Chol> on Lip40; weight is down to 114#, BMI=20; FLP 4/17 shows TChol 216, TG 115, HDL 79, LDL 113 & med compliance is suspect- reminded to take med every day!    DM w/ neuropathy> prev on Metform500Bid & Glipiz10-2Bid but ?what she is taking (didn't brink bottles); prev followed by DrEllison- Actos stopped due to edema; won't take additional meds due to cost; refuses insulin shots; she claims that BS higher when her knee is hurting; Labs 4/16 showed BS=175, A1c=9.4;  Labs 4/17 showed BS=268, A1c=10.6; Labs 2/18 showed BS=288, A1c=11.7;  I confirmed w/ [name] on mult occas that she is NOT filling her 2 meds regularly!  We will again try to refer to Endocrine (she has refused mult times)=> she "no showed" for appt w/ DrEllison 03/31/16;  Post Hosp 6/18> BS was easily controlled in hosp & BS=136-219, A1c=6.2;  She was disch on Glipizide10Bid, off Metformin but son reports BS>300 at home & we will re-start Metform500Bid...    GI- HH, colon polyps, s/p GB> stable she says, not on regular PPI dosing, but disch 6/18 on  Pepcid20Qhs; last colon was 2003 by DrStark w/ 561mpolyp & divertics; denies abd pain, n/v, d/c, blood seen...    Urinary incont & Hx renal infarction, ?bilat hydroneph on MRI spine > ?followed by DrEskridge (last note 2015)- hx renal infarct 1/13, bilat extrarenal pelvis, & left peri-pelvic cysts, small bladder capacity, known cystocele, trial of Myrtetriq/Detrol was unsuccessful; DrEskridge reviewed & no hydro seen; Hx AFib & supposed to be on Xarelto per DrTaylor, pt refuses to take this med unless given samples by Cards & they ran out=> pt went un-anticoagulated & developed abd pain 06/2016=> Hosp w/ renal infarction & improved back on COUMADIN anticoag...    DJD, LBP, right shoulder pain> on Neurontin100Tid; Ortho evals from DrNorris/ [facility]- prev knee surg for torn cartilage, back discomfort treated w/ Tramadol & Tylenol...    Osteoporosis, Vit D defic> Vit D level 2013 = 30 and she was asked to incr her OTC Vit D supplement to 2000u daily; needs f/u BMD but she wants to wait...    Memory Loss, MCI, Dementia> Hx 2 sm infarcts on prev MRI, prev on Xarelto from Cards but as noted compliance was poor; she declines Aricept etc...    Anxiety> mod stress in the family, she declines anxiolytic meds...    B12 defic> on OTC oral B12 supplement 1000u daily; Epic labs showed B12=135 in 2011 and improved on recheck in 2013 to 905...    Medication non-compliance>  This is well documented at each & every office visit; Also requested to bring all med bottles to each visit & she NEVER does!... EXAM shows Afeb, VSS, O2sat=94% on RA;  She looks weak & frail- HEENT- dental problems, mallampati1;  Chest- decr BS on left, scat rhonchi, no wheezes or rales or consolidation;  Heart- Irreg Afib, gr1-2/6 SEM w/o gallop or rub heard;  Abd- soft, nontender, neg;  Ext- VI, w/ 2+edema in LEs L>R;  Neuro- intact w/o focal deficits.  CXR 12/22/16 (independently reviewed by me in the PACS system) shows cardiomeg, mod left pleural  effusion & smalll right effusion w/  basilar atx vs infiltrate...  LABS 12/22/16>  Chems- K=3.4, Cr=1.07, BS=161, A1c=6.9, BNP=2994;  CBC- ok w/ Hg=12.9, WBC=6.5, Sed=11;  TSH=8.12;  Protime=31.9 (INR=3.0)... IMP/PLAN>>  She is clearly fluid overloaded- stopped Lasix on her own ~1wk ago, ?just from this & dietary sodium indiscretion? Vs CHF/ poss worsening LVF (last EF was 55% on 2DEcho 02/2016); rec to go to Er for eval & rx but they wanted to await results of lab work=> needs ER eval now for admission in this frail elderly woman... Asked to take Lasix now, hold Coumadin til [facility], she will also need to start Levothyroid, supplement potassium, etc...           Problem List:     HYPERTENSION >> VALVULAR HEART DISEASE >> ATRIAL FIBRILLATION - stable on Rx w/ XARELTO (but she would not fill rx, only used samples) & rate control strategy => shitched to Coumadin via [facility] protocol. ~  4/11:  she reports that she stopped her Lanoxin 0.125 on her own & doesn't want to restart. ~  8/11:  f/u DrTaylor & stable on Coumadin w/ rate control strategy, no changes made. ~  11/11:  Hosp by Neuro w/ TIA> 2DEcho showed mild LVH, diffuse HK w/ EF= 45%, thick pos MV leaflet w/ restrict motion & modMR, dilated LA&RA, severeTR & PAsys=49, can't r/o PFO... ~  8/12:  her BP=116/84 and she feels OK- denies HA, fatigue, visual changes, CP, palipit, dizziness, syncope, dyspnea, edema, etc... ~  12/12:  BP= 126/88 and she remains asymptomatic as above... ~  1/13:  2DEcho showed mild LVH w/ EF= 40-45% & diffuse HK, Diastolic dysfunction, modMR, severe LAE, modTR, Pasys=45 ~  She was switched from Coumadin to [facility] 1/13 due to the renal infarcts & difficulty maintaining INR. ~  CXR 1/13 showed marked cardiomegaly, pulm vasc congestion, NAD... ~  4/13:  BP= 128/60 and she is feeling better she says;  BUN=15, Creat=0.8; She had f/u DrTaylor> noted to be doing well w/o symptoms- continue same meds & low Na. ~  EKG 4/13 showed  AFib, rate 77, right ward axis & NSSTTWA... ~  8/13:  BP= 110/82 and she denies current CP, palpit, SOB, edema... ~  4/14: on Aten50Bid; BP= 130/84 & she denies CP/ SOB/ edema, notes occas palpit. ~  5/14:  Adm by Cards> NSTEMI, Cath w/ LAD minor irreg only, LCirc w/ ostial 40-50%, mid RCA 95%, EF 55%. PCI: Vision BMS to the mid RCA. There was moderate MR on left ventriculogram;  ~  CXR 5/14 showed cardiomeg, underlying COPD w/ apical scarring, DJD in spine, NAD... ~  EKG 6/14 showed AFib, rate72, NSSTTWA...  ~  2DEcho 6/14 showed norm LV size & funct w/ EF=55%, mild MR, severe RA&LA dil, modTR,  ~  Adm 1/15 & 3/15 w/ CP, AFib w/ RVR, had NSTEMI 3/15 from demand ischemia; cath w/ patent coronaries, EF=50%; meds adjusted w/ stop ASA & decr Xarelto to '15mg'$  due to severe epistaxis... ~  CXR 3/15 showed cardiomegaly, hyperinflated lungs, biapical pleural thickening, NAD... ~  EKG 3/15 showed AFib, rate74, NSSTTWA... ~  4/15: on Aten50Bid, Diltiazem120, & Lasix20; also taking Xarelto15; BP= 110/64 & she denies CP/ SOB/ edema, notes occas palpit... ~  10/15:  on Aten50Bid, Diltiazem120,  & Lasix20-2Qam, Xarelto15; BP= 110/64 & she claims asymptomatic... ~  4/16: on Aten50, Cardizem120, Lasix20-2/d, Xarelto15; she reports doing well- no CP etc; BP= 110/70... ~  CXR 4/16 showed mod cardiomeg, hyperinflation, sl blunt angles c/w pleural thickening, DJD Tspine,  osteopenia, NAD. ~  [facility] 06/2016 w/ abd pain believed due to renal infarction from AFib & not on anticoag;  She was heparinized 7 transitioned to Coumadin;  disch onCoumadin to be followed by [place]  in [facility] lab...  HYPERCHOLESTEROLEMIA - on diet + SIMVASTATIN '40mg'$ /d... ~  [place] 2007 showed TChol 160, TG 143, HDL 55, LDL 76 ~  FLP 7/09 showed TChol 165, TG 129, HDL 60, LDL 80 ~  FLP 10/10 showed TChol 175, TG 68, HDL 80, LDL 82 ~  FLP 4/11 showed TChol 207, TG 104, HDL 84, LDL 111... rec better diet, continue same med. ~  FLP 11/11 on Simva40 showed TChol  180, TG 71, HDL 67, LDL 99 ~  FLP 8/12 on Simva40 showed TChol 196, TG 96, HDL 89, LDL 88 ~  FLP 4/13 on Simva40 showed TChol 211, TG 77, HDL 85, LDL 112... rec better diet, same med. ~  FLP 10/14 on Lip40 showed TChol 138, TG 63, HDL 65, LDL 61  ~  FLP 10/15 on Lip40 showed  TChol 127, TG 54, HDL 69, LDL 47 ~  FLP 4/16 on Lip40 showed TChol 214, TG 106, HDL 86, LDL 107   TYPE 2 DIABETES MELLITUS, Uncontrolled, w/ Neuropathy >>  ~  on METFORMIN '500mg'$ - 2tabsBid, GLIPZIDE '10mg'$ Bid (not taking Januvia due to $$)... ~  labs 1/09 showed BS= 148, HgA1c= 7.1.Marland KitchenMarland Kitchen continue meds + better diet... ~  labs 7/09 showed BS= 168, HgA1c= 7.6.Marland KitchenMarland Kitchen incr Metform 2Bid, contin Glip 10Bid & Januv... ~  she stopped the Januvia due to cost... ~  labs 4/10 showed BS= 185, Aic= 8.0.Marland KitchenMarland Kitchen reminder to take meds regularly. ~  labs 10/10 on Metform2Bid+Glip10Bid showed BS= 124, A1c= 6.7 ~  labs 4/11 showed BS= 162, A1c= 6.8.[name]. rec> better diet, take meds regularly. ~  labs 10/11 showed BS= 322, A1c= 8.3.Marland KitchenMarland Kitchen Pharm confirms not taking meds regularly!!! Discussed w/ pt... ~  Labs 4/12 showed BS= 91, A1c= 7.9.Marland KitchenMarland Kitchen Rec> keep same meds, take regularly, better low carb diet... ~  Labs 8/12 on Metform2Bid+Glip10Bid showed BS= 114, A1c= 7.3.Marland KitchenMarland Kitchen Continue same... ~  Labs in hosp 1/13 showed BS~ 153-209 & A1c=7.4... NOTE: she has refused other meds due to $$$ ~  Labs 4/13 on Metform2Bid+Glipiz10Bid showed BS= 208, A1c= 8.6... She is referred to Endocrinology ~  She saw DrEllison but refused Actos, refused Gliptins, etc... ~  Labs 8/13 on Metform2Bid+Glipiz10Bid showed BS= 163, A1c= 7.5.Marland KitchenMarland Kitchen Continue same + better diet... ~  Labs 10/14 on Metform2Bid+Glipiz10Bid showed BS= 181, A1c= 8.8.Marland KitchenMarland Kitchen rec better diet & incr Glipiz10-2Bid... ~  Labs 4/15 on Metform500-2Bid & Glipizide10-2Bid showed BS= 181, A1c= 9.3; she refuses additional meds; she cut back both meds to just Qam due to "side effects"... ~  Labs 10/15 on Metform500-2Qam & Glipiz10-2Qam showed  BS= 172, A1c= 8.4 and REC to add one of each at dinner in the PM...  ~  Labs 4/16 on Metform500-2Qam & Glipiz10-2Qam showed BS=175, A1c=9.4 (compliance is ?? She has been reluctant to follow my recs for increased meds) => refer to [facility] for DM management ~  [facility] 06/2016 w/ BS more easily conrolled in [facility] w/ diet & SSI; her A1c was recorded at 6.2, despite the fact that it was 11.7 several months earlier;  She was disch on Glipize10Bid & son reports that home BS checks all >300 since disch=> we will restart her Metform500Bid...  HIATAL HERNIA (ICD-553.3) - last EGD by DrStark was 3/03 and showed a HH, reflux...  COLONIC  POLYPS (ICD-211.3) - last colonoscopy 3/03 by DrStark showed divertics and 51m polyp (no path avail).  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 pt reports that there is nothing they can do to help... 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  ~  she has had several UTI's... then perineal rash & saw GYN= neg PAP & Rx yeast infection- resolved. ~  She will f/u w/ Urology for persistant symptoms, seen by DrEskridge w/ trial Myrbetriq but not much benefit per pt... ~  7/15: referred to DrEskridge by Ortho after MRI spine showed ?bilat hydro; reviewed by Urology & they disagreed- no hydro but she has hx renal infarct 1/13, bilat extrarenal pelvis, & left peri-pelvic cysts, small bladder capacity, known cystocele...  DEGENERATIVE JOINT DISEASE (ICD-715.90) - she needs right TKR but wants to put this off as long as poss... ~  4/12:  Ortho eval by DrNorris, hx prev knee arthroscopies, given Cortisone shot, & he plans ?Synvisc series... ~  9/12:  outpt knee surg to repair torn cartilage per DrNorris; preop clearance by DrTaylor & Coumadin Clinic...  BACK PAIN, LUMBAR (ICD-724.2) - XRays showed  scoliosis, facet degen arthritis, & osteopenia...  she has used ROBAXIN Prn... ~  4/12:  She reports eval from DrNorris w/ "arthritis all down my backbone" & he rec epid steroid injection, but she reports improved on NEURONTIN '100mg'$  Tid... ~  4/13:  She reports that back pain has improved but she still has some leg pain but notes that "it's tolerable"...  SHOULDER PAIN, RIGHT (ICD-719.41) - prev eval & Rx by DrRendall... s/p right shoulder surg 3/09... XRays showed calcium deposit, rotator cuff prob, & intol to Tramadol... ~  She tells me she wants to f/u w/ DrWhitfield for Ortho...  OSTEOPOROSIS (ICD-733.00) ~  labs 10/10 w/ Vit D level = 32... rec> start Vit D OTC 1000 u daily. ~  Labs 4/13 showed Vit D level = 30... rec to incr to 2000u daily...  MEMORY LOSS (ICD-780.93) - concern for her memory expressed 7/09 OV- tried Aricept but she stopped it- "no different". TIA/ INFARCT - she was Hosp 11/11 by DrWillis w/ left sided weakness, ?left facial droop, & MRI showed 2 sm infarcts on right (frontal & ?pontine);  MRA was neg;  CDopplers were neg;  Deficits all cleared rapidly;  2DEcho was abn- see above;  She was continued on her Coumadin Rx- no changes made... ~  4/13:  She was offered memory medications but she declined...  ANXIETY (ICD-300.00) - stress in family... 2 y/o granddau w/ seizures...  VITAMIN B12 DEFICIENCY >> on OTC Vit B-12 tabs orally> 10055m/d... ~  Labs 7/11 showed Vit V12 level = 135... Started on Vit B12 supplement w/ 100060md... ~  Labs 4/13 showed Vit B12 level = 905... Ok top decr to 1/2 tab daily...   Past Surgical History:  Procedure Laterality Date  . cataract surgery/both eyes     01/2012 left eye---02/2012 right eye  . CHOLECYSTECTOMY    . LEFT HEART CATHETERIZATION WITH CORONARY ANGIOGRAM N/A 05/28/2012   Procedure: LEFT HEART CATHETERIZATION WITH CORONARY ANGIOGRAM;  Surgeon: MicSherren MochaD;  [facility];  Service: Cardiovascular;  Laterality:  N/A;  . LEFT HEART CATHETERIZATION WITH CORONARY ANGIOGRAM N/A 03/14/2013   Procedure: LEFT HEART CATHETERIZATION WITH CORONARY ANGIOGRAM;  Surgeon: HenSinclair GroomsD;  Location: [facility] CATH LAB;  Service: Cardiovascular;  Laterality: N/A;  . PERCUTANEOUS CORONARY STENT INTERVENTION (PCI-S)  05/28/2012   Procedure: PERCUTANEOUS CORONARY STENT INTERVENTION (PCI-S);  Surgeon: Sherren Mocha, MD;  Location: [facility] CATH LAB;  Service: Cardiovascular;;  . rt.leg surgery  11/2010    Outpatient Encounter Medications as of 12/22/2016  Medication Sig  . acetaminophen (TYLENOL) 500 MG tablet Take 500 mg by mouth every 6 (six) hours as needed for mild pain.  [name] atenolol (TENORMIN) 50 MG tablet TAKE ONE TABLET BY MOUTH ONCE DAILY  . atorvastatin (LIPITOR) 40 MG tablet TAKE ONE TABLET BY MOUTH ONCE DAILY AT 6PM  . Blood Glucose Monitoring Suppl (ACCU-CHEK AVIVA PLUS) w/Device KIT Test blood sugar up to two times daily  . ciprofloxacin (CIPRO) 250 MG tablet Take 1 tablet (250 mg total) by mouth 2 (two) times daily.  [name] diltiazem (CARTIA XT) 120 MG 24 hr capsule Take 1 capsule (120 mg total) by mouth daily.  . famotidine (PEPCID) 20 MG tablet Take 1 tablet (20 mg total) by mouth at bedtime.  . feeding supplement, ENSURE ENLIVE, (ENSURE ENLIVE) LIQD Take 237 mLs by mouth 2 (two) times daily between meals.  . furosemide (LASIX) 20 MG tablet Take 1 tablet (20 mg total) daily by mouth.  [name] glipiZIDE (GLUCOTROL) 10 MG tablet Take 1 tablet (10 mg total) by mouth 2 (two) times daily.  [name] glucose blood (ACCU-CHEK AVIVA PLUS) test strip Test blood sugar up to two times daily  . LANCETS ULTRA FINE MISC Test blood sugar up to two times daily  . metFORMIN (GLUCOPHAGE) 500 MG tablet Take 500 mg by mouth 2 (two) times daily with a meal.   . nitroGLYCERIN (NITROSTAT) 0.4 MG SL tablet Place 1 tablet (0.4 mg total) under the tongue every 5 (five) minutes as needed for chest pain (up to 3 doses).  [name] omeprazole (PRILOSEC) 20 MG capsule  Take 1 tablet by mouth 30 minutes before breakfast  . vitamin B-12 (CYANOCOBALAMIN) 1000 MCG tablet Take 1,000 mcg by mouth every morning.   . warfarin (COUMADIN) 4 MG tablet Take 1 tablet (4 mg total) by mouth daily. (Patient taking differently: Current dose as of 6/21 is 1/2 tablet every evening)  . [DISCONTINUED] amoxicillin-clavulanate (AUGMENTIN) 875-125 MG tablet Take 1 tablet by mouth 2 (two) times daily. (Patient not taking: Reported on 12/22/2016)   No facility-administered encounter medications on file as of 12/22/2016.      Allergies  Allergen Reactions  . Peanut-Containing Drug Products Anaphylaxis and Swelling  . Azithromycin Other (See Comments)    REACTION: pt states INTOL to ZPak  . Flomax [Tamsulosin Hcl] Swelling  . Pioglitazone Other (See Comments)    edema  . Pregabalin Other (See Comments)    REACTION: pt states INTOL to Lyrica  . Sulfonamide Derivatives Swelling    Immunization History  Administered Date(s) Administered  . Influenza Split 09/22/2010, 10/17/2011  . Influenza Whole 10/14/2008, 10/13/2009  . Influenza,inj,Quad PF,6+ Mos 10/18/2012, 10/23/2013, 10/24/2014, 10/26/2015  . Tdap 12/17/2010    Current Medications, Allergies, Past Medical History, Past Surgical History, Family History, and Social History were reviewed in [employer] record.   Review of Systems        See HPI - all other systems neg except as noted... The patient complains of weight loss, chest pain, dyspnea on exertion, and muscle weakness.  The patient denies anorexia, fever, weight gain, vision loss, decreased hearing, hoarseness, syncope, peripheral edema, prolonged cough, headaches, hemoptysis, abdominal pain,  melena, hematochezia, severe indigestion/heartburn, hematuria, incontinence, suspicious skin lesions, transient blindness, difficulty walking, depression, unusual weight change, abnormal bleeding, enlarged lymph nodes, and angioedema.     Objective:     Physical Exam      WD, WN, 83 y/o WF in NAD...  GENERAL:  Alert & oriented x2; pleasant & cooperative... HEENT:  [place]/AT, EOM-wnl, PERRLA, EACs-clear, TMs-wnl, NOSE-clear, THROAT- sl red w/o lesions NECK:  Supple w/ fairROM; +JVD; normal carotid impulses w/o bruits; no thyromegaly or nodules palpated; no lymphadenopathy. CHEST:  Decr BS at bases L>R; without wheezes/ rales/ or rhonchi; +tender left 11th-12th ribs & costal margin. HEART:  Iregular Rhythm= AFib; Gr1-2 SEM without rubs or gallops heard... ABDOMEN:  Soft & nontender; normal bowel sounds; no organomegaly or masses detected. EXT: without deformities, mild arthritic changes; no varicose veins/ +venous insuffic/ +edema L>R NEURO:  CN's intact;  no focal neuro deficits found... DERM:  No lesions noted; no rash etc...  RADIOLOGY DATA:  Reviewed in the EPIC EMR & discussed w/ the patient...  LABORATORY DATA:  Reviewed in the EPIC EMR & discussed w/ the patient...   Assessment & Plan:    10/26/15>   Ireoluwa continues to treat herself by taking the meds as she wants & not as prescribed; she has also tied my hands as she will not take any of the newed (more expensive) DM meds and wil not agree to insulin rx; finally I have tried to refer her to endocrine/ DM specialist but she has repeatedly refused to go; we reviewed low carb/ DM diet & rec to take the meds as follows- Metform500- 2Qam & 1Qpm plus the Glipizide10-2Qam & 1Qpm as well... She has again declined my offer to send her to a DM specialist for their review. 03/10/16>    Blakeley has serious multisystem dis- CARDIAC, Pulm, DM, GI/GU, etc; she is her own worst enemy as she won't take meds regularly, refuses additional consults eg-Endocruine/DM, refuses to change her meds or consider insulin shots,  Most recent Hosp for CP=> Abd pain showed abnormal 2DEcho & abn CT Abd as noted;  Radiology & [facility] hospitalists want [place] to order MRI abd w/ contrast to f/u on the pancreatic lesions & we will  comply; Pt rec to take Prilosec & Pepcid. 04/25/16>   I stressed to Tenishia the importance of her specialty clinics including AFib clinic w/ consideration of Coumadin anticoag instead of xarelto which is unaffordable for her; also the Endocrine/ DM cilinic w/ DrEllison to effect better control of her DM 06/30/16>  Kassie has failed specialty clinic management of her issues (AFib w/ need for anticoag, & DM management); we will endeavor to see her frequently here & manage her medical issues... 07/14/16>   Today we eliminated sodium, stopped Gabapentin, rechecked BS/ A1c, and reviewed Coumadin protocol w/ pt & Shorty... 08/22/16>   We decided to incr the Coumadin slightly by taking one tab ('4mg'$ ) on Mon & 1/2 tab the other 6d... She will continue w/ the weekly protimes for now & we are following; continue Med Monitoring via pt'a son Shanon Brow...  12/22/16>   She is clearly fluid overloaded- stopped Lasix on her own ~1wk ago, ?just from this & dietary sodium indiscretion? Vs CHF/ poss worsening LVF (last EF was 55% on 2DEcho 02/2016); rec to go to Er for eval & rx but they wanted to await results of lab work=> needs ER eval now for admission in this frail elderly woman... Asked to take Lasix now, hold Coumadin til  Hosp, she will also need to start Levothyroid, supplement potassium, etc   HBP>  Stable on BBlocker, CCB, Losar25; she is off the Lasix now... SHE DOES NOT KNOW HER MEDS, SHE IS NONCOMPLIANT & DID NOT BRING BOTTLES.  CAD>  Hosp 5/14 for NSTEMI=> stent to RCA; Hosp 3/15 w/ NSTEMI & cath showed widely patent coronaries & EF=50%; HOSP 2/18 w/ CP/ Abd pain & eval essent neg x for severe cardiac dis, valvular dis, chr AFib, pulmHTN...  AFIB>  She had abn 2DEcho in Hosp 11/11, 1/13, & 2/18- reviewed by Cards- she sees DrTaylor et al & on XARELTO15 due to epistaxis...  CHOL>  FLP looked fair on Lip40 but medication compliance is poor- reminded to take med every day...  DM>  A1c is 10.6-11.7 now on Metform500-2Qam &  Glipiz10 Bid & she refuses additional meds + she is NOT taking these meds everyday!  Asked to take all meds as prescribed (BID) regularly & once again asked to see ENDOCRINE for DM management.  RENAL INFARCTION>  Adm 1/13 w/ renal infarcts felt due to micro-emboli & inadeq INR ==> changed to XARELTO but she can't afford 7 won't take unless provided full samples.  URINARY INCONTINENCE>  Followed by Eli Hose now Eskridge & she feels that there is nothing they can do to help her; wears pads; offered second opinion & she will consider it...  ORTHO>  Followed by DrNorris/ [facility] & she is s/p cortisone shots in right knee & ?Synvisc series; she had arthroscopic surg for torn cartilage 9/12 she says & she doesn't want TKR.  STROKE>  She has had a CVA w/ hosp by Neuro 11/11 [facility] records all reviewed);  She was continued on her Coumadin per DrWillis & followed in [facility]=> changed to [place] 2013 as noted;  Stable w/o recurrent cerebral ischemic symptoms...  Other medical problems as noted... She will continue the Calcium, MVI, Vit D, & Vit B12 regimen...     Medication List        Accurate as of 12/22/16 11:59 PM. Always use your most recent med list.          ACCU-CHEK AVIVA PLUS w/Device Kit Test blood sugar up to two times daily   acetaminophen 500 MG tablet Commonly known as:  TYLENOL   atenolol 50 MG tablet Commonly known as:  TENORMIN TAKE ONE TABLET BY MOUTH ONCE DAILY   atorvastatin 40 MG tablet Commonly known as:  LIPITOR TAKE ONE TABLET BY MOUTH ONCE DAILY AT 6PM   ciprofloxacin 250 MG tablet Commonly known as:  CIPRO Take 1 tablet (250 mg total) by mouth 2 (two) times daily.   diltiazem 120 MG 24 hr capsule Commonly known as:  CARTIA XT Take 1 capsule (120 mg total) by mouth daily.   famotidine 20 MG tablet Commonly known as:  PEPCID Take 1 tablet (20 mg total) by mouth at bedtime.   feeding supplement (ENSURE ENLIVE) Liqd Take 237 mLs by mouth 2 (two) times  daily between meals.   furosemide 20 MG tablet Commonly known as:  LASIX Take 1 tablet (20 mg total) daily by mouth.   glipiZIDE 10 MG tablet Commonly known as:  GLUCOTROL Take 1 tablet (10 mg total) by mouth 2 (two) times daily.   glucose blood test strip Commonly known as:  ACCU-CHEK AVIVA PLUS Test blood sugar up to two times daily   LANCETS ULTRA FINE Misc Test blood sugar up to two times daily   metFORMIN 500 MG tablet  Commonly known as:  GLUCOPHAGE   nitroGLYCERIN 0.4 MG SL tablet Commonly known as:  NITROSTAT Place 1 tablet (0.4 mg total) under the tongue every 5 (five) minutes as needed for chest pain (up to 3 doses).   omeprazole 20 MG capsule Commonly known as:  PRILOSEC Take 1 tablet by mouth 30 minutes before breakfast   vitamin B-12 1000 MCG tablet Commonly known as:  CYANOCOBALAMIN   warfarin 4 MG tablet Commonly known as:  COUMADIN Take 1 tablet (4 mg total) by mouth daily.

## 2016-12-24 ENCOUNTER — Inpatient Hospital Stay (HOSPITAL_COMMUNITY): Payer: Medicare Other

## 2016-12-24 ENCOUNTER — Other Ambulatory Visit: Payer: Self-pay

## 2016-12-24 DIAGNOSIS — I248 Other forms of acute ischemic heart disease: Secondary | ICD-10-CM

## 2016-12-24 DIAGNOSIS — I5033 Acute on chronic diastolic (congestive) heart failure: Secondary | ICD-10-CM

## 2016-12-24 DIAGNOSIS — I482 Chronic atrial fibrillation: Secondary | ICD-10-CM

## 2016-12-24 DIAGNOSIS — R413 Other amnesia: Secondary | ICD-10-CM

## 2016-12-24 DIAGNOSIS — I361 Nonrheumatic tricuspid (valve) insufficiency: Secondary | ICD-10-CM

## 2016-12-24 DIAGNOSIS — I25119 Atherosclerotic heart disease of native coronary artery with unspecified angina pectoris: Secondary | ICD-10-CM

## 2016-12-24 LAB — ECHOCARDIOGRAM COMPLETE
HEIGHTINCHES: 65 in
Weight: 2112.89 oz

## 2016-12-24 LAB — PROTIME-INR
INR: 2.46
Prothrombin Time: 26.5 seconds — ABNORMAL HIGH (ref 11.4–15.2)

## 2016-12-24 LAB — URINALYSIS, ROUTINE W REFLEX MICROSCOPIC
Bilirubin Urine: NEGATIVE
Glucose, UA: NEGATIVE mg/dL
Ketones, ur: NEGATIVE mg/dL
Leukocytes, UA: NEGATIVE
Nitrite: NEGATIVE
PH: 5 (ref 5.0–8.0)
Protein, ur: 30 mg/dL — AB
SPECIFIC GRAVITY, URINE: 1.01 (ref 1.005–1.030)

## 2016-12-24 LAB — GLUCOSE, CAPILLARY
GLUCOSE-CAPILLARY: 139 mg/dL — AB (ref 65–99)
GLUCOSE-CAPILLARY: 180 mg/dL — AB (ref 65–99)
GLUCOSE-CAPILLARY: 75 mg/dL (ref 65–99)
Glucose-Capillary: 141 mg/dL — ABNORMAL HIGH (ref 65–99)

## 2016-12-24 LAB — BASIC METABOLIC PANEL
ANION GAP: 13 (ref 5–15)
BUN: 22 mg/dL — ABNORMAL HIGH (ref 6–20)
CALCIUM: 8.9 mg/dL (ref 8.9–10.3)
CO2: 22 mmol/L (ref 22–32)
Chloride: 105 mmol/L (ref 101–111)
Creatinine, Ser: 1.13 mg/dL — ABNORMAL HIGH (ref 0.44–1.00)
GFR calc Af Amer: 50 mL/min — ABNORMAL LOW (ref >60)
GFR, EST NON AFRICAN AMERICAN: 43 mL/min — AB (ref >60)
Glucose, Bld: 144 mg/dL — ABNORMAL HIGH (ref 65–99)
POTASSIUM: 4.1 mmol/L (ref 3.5–5.1)
SODIUM: 140 mmol/L (ref 135–145)

## 2016-12-24 LAB — MAGNESIUM: Magnesium: 2.5 mg/dL — ABNORMAL HIGH (ref 1.7–2.4)

## 2016-12-24 LAB — TROPONIN I
TROPONIN I: 0.85 ng/mL — AB (ref <0.03)
Troponin I: 0.93 ng/mL (ref <0.03)

## 2016-12-24 LAB — TSH: TSH: 6.902 u[IU]/mL — AB (ref 0.350–4.500)

## 2016-12-24 MED ORDER — WARFARIN SODIUM 2 MG PO TABS
2.0000 mg | ORAL_TABLET | Freq: Once | ORAL | Status: AC
  Administered 2016-12-24: 2 mg via ORAL
  Filled 2016-12-24: qty 1

## 2016-12-24 MED ORDER — VITAMINS A & D EX OINT
TOPICAL_OINTMENT | CUTANEOUS | Status: AC
  Filled 2016-12-24: qty 5

## 2016-12-24 MED ORDER — LEVOTHYROXINE SODIUM 50 MCG PO TABS
62.5000 ug | ORAL_TABLET | Freq: Every day | ORAL | Status: DC
  Administered 2016-12-24 – 2016-12-30 (×7): 62.5 ug via ORAL
  Filled 2016-12-24 (×8): qty 1

## 2016-12-24 NOTE — Consult Note (Signed)
Cardiology Consultation:   Patient ID: Michelle Herrera; [id number]; [birth year]   Admit date: 12/23/2016 Date of Consult: 12/24/2016  Primary Care Provider: Noralee Space, MD Primary Cardiologist: Dr. Cristopher Peru Primary Electrophysiologist:  Dr. Cristopher Peru   Patient Profile:   Michelle Herrera is an 83 y.o. female with a history of chronic atrial fibrillation, CAD, hypertension, previous stroke, and diastolic heart failure who is being seen today for the evaluation of congestive heart failure and abnormal troponin level at the request of Dr. Grandville Silos.  History of Present Illness:   [name] presents to the hospital with reported worsening shortness of breath as well as lower extremity edema, cough with intermittent sputum production but no fevers or chills, hematuria, and relative confusion.  In speaking with her this morning, she could not characterize for me why she was in the hospital.  Based on information obtained in the history and physical, patient had apparently not been compliant with her regular medications over the last 4-5 days, including Lasix.  Evaluation so far includes chest x-ray which shows evidence of bilateral pleural effusions, left greater than right as well as interstitial edema consistent with congestive heart failure.  BNP level was elevated at 2994.  Troponin I levels have also been elevated at 1.05, 0.93, and 0.85.  She does not report any active chest pain.  I reviewed her recent ECG as detailed below.  Echocardiogram from February of this year revealed LVEF approximately 55% with reduced right ventricular contraction associated with severe tricuspid regurgitation and moderate pulmonary hypertension.  Also severe biatrial enlargement.  Follow-up study is pending.  She is on Coumadin with history of chronic atrial fibrillation, INR therapeutic at 2.46.  Past Medical History:  Diagnosis Date  . AF (atrial fibrillation) ([place])    a. Chronic Xarelto    . Anxiety state, unspecified   . Benign neoplasm of colon   . CAD (coronary artery disease)    a. 05/2012 NSTEMI/Cath/PCI: LM nl, LAD min irregs, LCX min irregs, RI 40-50 ost, OM1 nl, RCA dom, 22m (4.0x12 Vision BMS), PD/PL nl, EF 55%, at least mod MR. b. NSTEMI 03/2013 - cath with widely patent coronaries, suspect demand ischemia due to RVR.  [name] Chronic combined systolic and diastolic CHF (congestive heart failure) ([place])    a. 01/2011 Echo: EF 40-45%, diff HK, diast dysfxn, mild to mod MR, mod to sev dil LA/RV/RA, mod to sev reduced RV fxn, mod TR, PASP 1mmHg.;  b.  Echo 06/15/12:  EF 55%, mild MR, severe BAE, mod TR, PASP 35  . Diaphragmatic hernia without mention of obstruction or gangrene   . Epistaxis    a. s/p cauterization 11/2012.  [name] History of stroke   . HTN (hypertension)   . Hx of cardiovascular stress test    a. Myoview in 2006 was negative for ischemia, EF 54%. Martin Majestic on to have CAD development 05/2012.)  . Hypomagnesemia   . Lumbago   . Memory loss   . Mitral regurgitation    a. mild by echo 06/2012.  . Mixed incontinence urge and stress (female)(female)   . Osteoarthrosis, unspecified whether generalized or localized, unspecified site   . Osteoporosis, unspecified   . Pancreatic abnormality 04/25/2016  . Pure hypercholesterolemia   . Renal infarction ([place])    01/2011 in setting of low INR  . Type II or unspecified type diabetes mellitus without mention of complication, not stated as uncontrolled     Past Surgical History:  Procedure Laterality Date  .  cataract surgery/both eyes     01/2012 left eye---02/2012 right eye  . CHOLECYSTECTOMY    . LEFT HEART CATHETERIZATION WITH CORONARY ANGIOGRAM N/A 05/28/2012   Procedure: LEFT HEART CATHETERIZATION WITH CORONARY ANGIOGRAM;  Surgeon: Sherren Mocha, MD;  Location[facility] C[facility] LAB;  Service: Cardiovascular;  Laterality: N/A;  . LEFT HEART CATHETERIZATION WITH CORONARY ANGIOGRAM N/A 03/14/2013   Procedure: LEFT HEART CATHETERIZATION WITH  CORONARY ANGIOGRAM;  Surgeon: Sinclair Grooms, MD;  Location: [facility] CATH LAB;  Service: Cardiovascular;  Laterality: N/A;  . PERCUTANEOUS CORONARY STENT INTERVENTION (PCI-S)  05/28/2012   Procedure: PERCUTANEOUS CORONARY STENT INTERVENTION (PCI-S);  Surgeon: Sherren Mocha, MD;  Location: [facility] CATH LAB;  Service: Cardiovascular;;  . rt.leg surgery  11/2010     Inpatient Medications: Scheduled Meds: . atenolol  50 mg Oral Daily  . diltiazem  120 mg Oral Daily  . furosemide  40 mg Intravenous Q12H  . insulin aspart  0-9 Units Subcutaneous TID WC  . levothyroxine  62.5 mcg Oral QAC breakfast  . potassium chloride  40 mEq Oral Daily  . sodium chloride flush  3 mL Intravenous Q12H  . vitamin A & D      . Warfarin - Pharmacist Dosing Inpatient   Does not apply q1800   Continuous Infusions: . sodium chloride     PRN Meds: sodium chloride, acetaminophen, bismuth subsalicylate, ondansetron (ZOFRAN) IV, sodium chloride flush, tetrahydrozoline  Allergies:    Allergies  Allergen Reactions  . Peanut-Containing Drug Products Anaphylaxis and Swelling  . Atorvastatin     "MAKES HER FEEL BAD"  . Azithromycin Other (See Comments)    REACTION: pt states INTOL to ZPak  . Flomax [Tamsulosin Hcl] Swelling  . Pioglitazone Other (See Comments)    edema  . Pregabalin Other (See Comments)    REACTION: pt states INTOL to Lyrica  . Sulfonamide Derivatives Swelling    Social History:   Social History   Socioeconomic History  . Marital status: Widowed    Spouse name: Not on file  . Number of children: 6  . Years of education: Not on file  . Highest education level: Not on file  Social Needs  . Financial resource strain: Not on file  . Food insecurity - worry: Not on file  . Food insecurity - inability: Not on file  . Transportation needs - medical: Not on file  . Transportation needs - non-medical: Not on file  Occupational History    Employer: RETIRED  Tobacco Use  . Smoking status: Never  Smoker  . Smokeless tobacco: Never Used  Substance and Sexual Activity  . Alcohol use: No    Alcohol/week: 0.0 oz  . Drug use: No  . Sexual activity: No  Other Topics Concern  . Not on file  Social History Narrative   1 sister--in poor health   1 brother hx of kidney stones and heart problems    Family History:   The patient's family history includes CAD in her brother; Pneumonia in her mother; Stomach cancer in her father.  ROS:  Please see the history of present illness.  Difficult to obtain complete review of systems due to patient confusion.  Physical Exam/Data:   Vitals:   12/23/16 1910 12/23/16 2100 12/23/16 2136 12/24/16 0408  BP: 132/87  121/82 101/72  Pulse: 86  92 80  Resp: 20  (!) 21 20  Temp: 98.2 F (36.8 C)  98.1 F (36.7 C) (!) 97.4 F (36.3 C)  TempSrc: Oral  Oral  Axillary  SpO2: 91%  95% 93%  Weight:  130 lb 8.2 oz (59.2 kg)  132 lb 0.9 oz (59.9 kg)  Height:  5\' 5"  (1.651 m)      Intake/Output Summary (Last 24 hours) at 12/24/2016 0927 Last data filed at 12/24/2016 0115 Gross per 24 hour  Intake 120 ml  Output 100 ml  Net 20 ml   Filed Weights   12/23/16 2100 12/24/16 0408  Weight: 130 lb 8.2 oz (59.2 kg) 132 lb 0.9 oz (59.9 kg)   Body mass index is 21.98 kg/m.   Gen: Chronically ill-appearing elderly woman in no distress. HEENT: Conjunctiva and lids normal, oropharynx clear. Neck: Supple, elevated JVP, no carotid bruits, no thyromegaly. Lungs: Diminished breath sounds at the bases with scattered crackles, nonlabored breathing at rest. Cardiac: Irregularly irregular, no S3, 2/6 apical systolic murmur, no pericardial rub. Abdomen: Soft, nontender, bowel sounds present, no guarding or rebound. Extremities: SCDs in place with mild lower leg edema, distal pulses 1-2+. Skin: Warm and dry. Musculoskeletal: No kyphosis. Neuropsychiatric: Alert and oriented x2, affect grossly appropriate.  EKG:  I personally reviewed the tracing from 12/23/2016  which showed atrial fibrillation with low voltage, decreased R wave progression, nonspecific ST-T changes.  Telemetry:  I personally reviewed telemetry which shows atrial fibrillation.  Relevant CV Studies:  Echocardiogram 03/01/2016: Study Conclusions  - Left ventricle: The cavity size was normal. Wall thickness was   normal. Indeterminant diastolic function (atrial fibrillation).   The estimated ejection fraction was 55%. Wall motion was normal;   there were no regional wall motion abnormalities. - Aortic valve: There was no stenosis. There was trivial   regurgitation. - Mitral valve: There was mild regurgitation. - Left atrium: The atrium was severely dilated. - Right ventricle: The cavity size was mildly to moderately   dilated. Systolic function was mildly reduced. - Right atrium: The atrium was severely dilated. - Tricuspid valve: There was severe regurgitation. Peak RV-RA   gradient (S): 51 mm Hg. - Pulmonary arteries: PA peak pressure: 59 mm Hg (S). - Systemic veins: IVC measured 2.2 cm with > 50% respirophasic   variation, suggesting RA pressure 8 mmHg.  Impressions:  - The patient was in atrial fibrillation. Normal LV size with EF   55%. Mild to moderate RV dilation with mildly decreased systolic   function. Mild to moderate MR. Severe TR. Moderate pulmonary   hypertension. Severe biatrial enlargement.  Laboratory Data:  Chemistry Recent Labs  Lab 12/22/16 1109 12/23/16 1558 12/23/16 1608 12/24/16 0409  NA 142 142 143 140  K 3.4* 4.4 3.4* 4.1  CL 101 103 102 105  CO2 28 25  --  22  GLUCOSE 161* 145* 142* 144*  BUN 18 23* 21* 22*  CREATININE 1.07 1.23* 1.00 1.13*  CALCIUM 8.9 9.1  --  8.9  GFRNONAA  --  39*  --  43*  GFRAA  --  45*  --  50*  ANIONGAP  --  14  --  13    Recent Labs  Lab 12/22/16 1109 12/23/16 1558  PROT 6.5 6.8  ALBUMIN 3.8 3.5  AST 14 35  ALT 9 10*  ALKPHOS 78 90  BILITOT 1.3* 1.5*   Hematology Recent Labs  Lab  12/22/16 1109 12/23/16 1558 12/23/16 1608  WBC 6.5 8.2  --   RBC 4.05 3.93  --   HGB 12.9 12.6 14.3  HCT 39.3 37.0 42.0  MCV 97.1 94.1  --   MCH  --  32.1  --  MCHC 32.7 34.1  --   RDW 17.1* 16.7*  --   PLT 290.0 284  --    Cardiac Enzymes Recent Labs  Lab 12/23/16 1817 12/23/16 2305 12/24/16 0409  TROPONINI 1.05* 0.93* 0.85*   No results for input(s): TROPIPOC in the last 168 hours.  BNP Recent Labs  Lab 12/22/16 1109 12/23/16 1558  BNP  --  1,582.2*  PROBNP 2,994.0*  --     Radiology/Studies:  Dg Chest 2 View  Result Date: 12/23/2016 CLINICAL DATA:  Lower extremity swelling. EXAM: CHEST  2 VIEW COMPARISON:  12/22/2016.  02/29/2016. FINDINGS: Cardiomegaly. Bilateral effusions, larger on the left than the right, with basilar volume loss. Venous hypertension with mild interstitial edema. No acute bone finding. IMPRESSION: Fluid overload/ congestive heart failure with bilateral effusions left larger than right, dependent atelectasis, cardiomegaly and mild interstitial edema. Electronically Signed   By: Nelson Chimes M.D.   On: 12/23/2016 15:02   Dg Chest 2 View  Result Date: 12/22/2016 CLINICAL DATA:  Bilateral lower extremity swelling. EXAM: CHEST  2 VIEW COMPARISON:  Radiographs of February 29, 2016. FINDINGS: Stable cardiomegaly. Atherosclerosis of thoracic aorta is noted. No pneumothorax is noted. Interval development of moderate left pleural effusion with probable underlying atelectasis or infiltrate. Mild right basilar atelectasis or infiltrate is noted. Bony thorax is unremarkable. IMPRESSION: Moderate left pleural effusion with probable underlying atelectasis or infiltrate. Mild right basilar atelectasis or infiltrate is noted. Electronically Signed   By: Marijo Conception, M.D.   On: 12/22/2016 13:09    Assessment and Plan:   1.  Acute on chronic diastolic heart failure complicated by right ventricular dysfunction and pulmonary hypertension.  Follow-up echocardiogram  is pending to reassess LVEF as compared to February.  She does have evidence of fluid overload with pleural effusions and distal edema.  It is reported that the patient was not taking her regular Lasix for the last 4-5 days.  2.  Chronic atrial fibrillation, heart rate is adequately controlled at this time.  She is on atenolol and Cardizem CD.  Also on chronic Coumadin for stroke prophylaxis, INR presently therapeutic.  3.  Elevated troponin I in relatively flat pattern.  Suspect most likely related to heart failure with demand ischemia rather than definitive ACS.  ECG shows diffuse nonspecific ST-T changes.  She is not reporting active chest pain.  4.  CAD with history of BMS to the RCA in 2014.  Patient with recurrent NSTEMI in 2015 with findings of nonobstructive CAD, felt to be related to demand ischemia at that time.  Would continue atenolol and Cardizem CD for heart rate control.  Coumadin per pharmacy for stroke prophylaxis.  Agree with transition to IV Lasix at 40 mg twice daily.  Follow-up urine output and renal function.  We will review echocardiogram results for reassessment of cardiac structure and function.  At this time anticipate medical therapy without further ischemic testing, but will continue to reassess depending on how she does clinically.  Signed, Rozann Lesches, MD  12/24/2016 9:27 AM

## 2016-12-24 NOTE — Progress Notes (Signed)
ANTICOAGULATION CONSULT NOTE - Follow Up Consult  Pharmacy Consult for warfarin Indication: hx atrial fibrillation  Allergies  Allergen Reactions  . Peanut-Containing Drug Products Anaphylaxis and Swelling  . Atorvastatin     "MAKES HER FEEL BAD"  . Azithromycin Other (See Comments)    REACTION: pt states INTOL to ZPak  . Flomax [Tamsulosin Hcl] Swelling  . Pioglitazone Other (See Comments)    edema  . Pregabalin Other (See Comments)    REACTION: pt states INTOL to Lyrica  . Sulfonamide Derivatives Swelling    Patient Measurements: Height: 5\' 5"  (165.1 cm) Weight: 132 lb 0.9 oz (59.9 kg) IBW/kg (Calculated) : 57 Heparin Dosing Weight:   Vital Signs: Temp: 97.4 F (36.3 C) (12/15 0408) Temp Source: Axillary (12/15 0408) BP: 101/72 (12/15 0408) Pulse Rate: 80 (12/15 0408)  Labs: Recent Labs    12/22/16 1109 12/23/16 1558 12/23/16 1608 12/23/16 1817 12/23/16 2305 12/24/16 0409  HGB 12.9 12.6 14.3  --   --   --   HCT 39.3 37.0 42.0  --   --   --   PLT 290.0 284  --   --   --   --   LABPROT 31.9* 24.5*  --   --   --  26.5*  INR 3.0* 2.22  --   --   --  2.46  CREATININE 1.07 1.23* 1.00  --   --  1.13*  TROPONINI  --   --   --  1.05* 0.93* 0.85*    Estimated Creatinine Clearance: 33.3 mL/min (A) (by C-G formula based on SCr of 1.13 mg/dL (H)).   Medications:  PTA warfarin regimen: 2mg  daily and nothing on Mon and Thurs (verified with pt's son. He reported that Dr. Jeannine Kitten office called and gave him this new warfarin regimen on 12/14)  Assessment: Patient is an 83 y.o with hx chronic diastolic heart failure and afib on warfarin PTA, presented to the ED on 12/23/16 with c/o SOB.  Warfarin resumed for patient on admission.  Today, 12/24/2016: - INR is therapeutic at 2.46 - cbc stable - no bleeding documented - no significant drug-drug intxns  Goal of Therapy:  INR 2-3 Monitor platelets by anticoagulation protocol: Yes   Plan:  - repeat warfarin 2 mg PO x1  today - monitor for s/s bleeding  Mateya Torti P 12/24/2016,11:59 AM

## 2016-12-24 NOTE — Evaluation (Signed)
Physical Therapy Evaluation Patient Details Name: Michelle Herrera MRN: [id number] DOB: Oct 20, 1930 Today's Date: 12/24/2016   History of Present Illness   Patient is a 83 year old female history of atrial fibrillation on chronic anticoagulation with Coumadin, coronary artery disease, history of CVA, hypertension, mitral valve regurgitation, diabetes mellitus presented to the ED with worsening lower extremity edema, paroxysmal nocturnal dyspnea, shortness of breath on minimal exertion, cough productive of thick whitish sputum.  And also noted to have not taken her diuretics 4-5 days prior to admission.  Workup on admission concerning for an acute CHF exacerbation.  Clinical Impression  Patient presents with problems listed below.  Will benefit from acute PT to maximize functional mobility prior to discharge.  Patient declined OOB activities today, so session limited.  Able to move all extremities against gravity, but very weak.  Recommend HHPT at d/c for continued therapy.  If patient does not return to PLOF, may need to consider ST-SNF.  Will continue to assess.    Follow Up Recommendations Home health PT;Supervision/Assistance - 24 hour    Equipment Recommendations  None recommended by PT    Recommendations for Other Services       Precautions / Restrictions Precautions Precautions: Fall Restrictions Weight Bearing Restrictions: No      Mobility  Bed Mobility Overal bed mobility: Needs Assistance Bed Mobility: Rolling Rolling: Min assist         General bed mobility comments: Verbal cues for technique.  Assist to initiate movement, and to complete lower body movement to side.  Patient declined further mobility, stating "I can't"  Transfers                 General transfer comment: NT  Ambulation/Gait                Stairs            Wheelchair Mobility    Modified Rankin (Stroke Patients Only)       Balance                                              Pertinent Vitals/Pain Pain Assessment: No/denies pain    Home Living Family/patient expects to be discharged to:: Private residence Living Arrangements: Children(Son) Available Help at Discharge: Family Type of Home: House Home Access: Stairs to enter   Technical brewer of Steps: 1 step Home Layout: One level Home Equipment: Walker - 2 wheels Additional Comments: Patient unable to provide information.  Obtained from chart - prior admission.    Prior Function           Comments: Per chart, ambulates with RW.  Patient unable to provide information     Hand Dominance        Extremity/Trunk Assessment   Upper Extremity Assessment Upper Extremity Assessment: Defer to OT evaluation(Able to move against gravity.  Edema Bil forearms.)    Lower Extremity Assessment Lower Extremity Assessment: Difficult to assess due to impaired cognition;Generalized weakness       Communication   Communication: HOH  Cognition Arousal/Alertness: Awake/alert Behavior During Therapy: Anxious Overall Cognitive Status: No family/caregiver present to determine baseline cognitive functioning                                 General Comments: Patient disoriented to time and  situation.  Difficulty following commands.      General Comments      Exercises     Assessment/Plan    PT Assessment Patient needs continued PT services  PT Problem List Decreased strength;Decreased activity tolerance;Decreased balance;Decreased mobility;Decreased cognition;Decreased knowledge of use of DME;Decreased safety awareness;Cardiopulmonary status limiting activity       PT Treatment Interventions DME instruction;Gait training;Functional mobility training;Therapeutic activities;Therapeutic exercise;Balance training;Cognitive remediation;Patient/family education    PT Goals (Current goals can be found in the Care Plan section)  Acute Rehab PT Goals Patient  Stated Goal: None stated PT Goal Formulation: With patient Time For Goal Achievement: 12/31/16 Potential to Achieve Goals: Good    Frequency Min 3X/week   Barriers to discharge        Co-evaluation               AM-PAC PT "6 Clicks" Daily Activity  Outcome Measure Difficulty turning over in bed (including adjusting bedclothes, sheets and blankets)?: Unable Difficulty moving from lying on back to sitting on the side of the bed? : Unable Difficulty sitting down on and standing up from a chair with arms (e.g., wheelchair, bedside commode, etc,.)?: Unable Help needed moving to and from a bed to chair (including a wheelchair)?: A Little Help needed walking in hospital room?: A Lot Help needed climbing 3-5 steps with a railing? : A Lot 6 Click Score: 10    End of Session   Activity Tolerance: Patient limited by fatigue;Treatment limited secondary to medical complications (Comment)(Confusion) Patient left: in bed;with call bell/phone within reach;with bed alarm set Nurse Communication: Mobility status PT Visit Diagnosis: Difficulty in walking, not elsewhere classified (R26.2);Muscle weakness (generalized) (M62.81)    Time: 2800-3491 PT Time Calculation (min) (ACUTE ONLY): 10 min   Charges:   PT Evaluation $PT Eval Low Complexity: 1 Low     PT G Codes:        Carita Pian. Sanjuana Kava, [facility] Acute Rehab Services Pager [place] 12/24/2016, 8:25 PM

## 2016-12-24 NOTE — Plan of Care (Signed)
  Progressing Education: Knowledge of General Education information will improve 12/24/2016 0813 - Progressing by Braidyn Scorsone, Royetta Crochet, RN Health Behavior/Discharge Planning: Ability to manage health-related needs will improve 12/24/2016 0813 - Progressing by Justinian Miano, Royetta Crochet, RN Clinical Measurements: Ability to maintain clinical measurements within normal limits will improve 12/24/2016 0813 - Progressing by Shakeitha Umbaugh, Royetta Crochet, RN Will remain free from infection 12/24/2016 0813 - Progressing by Masie Bermingham, Royetta Crochet, RN Diagnostic test results will improve 12/24/2016 0813 - Progressing by Lafern Brinkley, Royetta Crochet, RN Respiratory complications will improve 12/24/2016 0813 - Progressing by Oluchi Pucci, Royetta Crochet, RN Cardiovascular complication will be avoided 12/24/2016 0813 - Progressing by Elenor Wildes, Royetta Crochet, RN Activity: Risk for activity intolerance will decrease 12/24/2016 0813 - Progressing by Juliahna Wiswell, Royetta Crochet, RN Nutrition: Adequate nutrition will be maintained 12/24/2016 0813 - Progressing by Giovonnie Trettel, Royetta Crochet, RN Coping: Level of anxiety will decrease 12/24/2016 0813 - Progressing by Upton Russey, Royetta Crochet, RN Elimination: Will not experience complications related to bowel motility 12/24/2016 0813 - Progressing by Tiandre Teall, Royetta Crochet, RN Note .

## 2016-12-24 NOTE — Progress Notes (Signed)
  Echocardiogram 2D Echocardiogram has been performed.  Merrie Roof F 12/24/2016, 10:11 AM

## 2016-12-24 NOTE — Progress Notes (Signed)
RT called to pts. room by RN for assessment after finding oxygen off pt. with saturations in low 80's, was placed on Vmask by RN prior to my arrival has hx. of A fib, saturations 88% with gradual leveling to 92-94% on 40%/8L venti mask, Lasix given by RN, RT available if needed.

## 2016-12-24 NOTE — Progress Notes (Signed)
Dr. Kennon Holter was made aware of the troponin being 1.05. Patient's vitals WNL. Patient not complaining of any chest pain. No new orders given.

## 2016-12-24 NOTE — Progress Notes (Signed)
PROGRESS NOTE    Michelle KLOSINSKI  WNI:[id number] DOB: 10/31/1930 DOA: 12/23/2016 PCP: Noralee Space, MD   Brief Narrative:   Patient is a 83 year old female history of atrial fibrillation on chronic anticoagulation with Coumadin, coronary artery disease, history of CVA, hypertension, mitral valve regurgitation, diabetes mellitus presented to the ED with worsening lower extremity edema, paroxysmal nocturnal dyspnea, shortness of breath on minimal exertion, cough productive of thick whitish sputum.  And also noted to have not taken her diuretics 4-5 days prior to admission.  Workup on admission concerning for an acute CHF exacerbation.  Patient admitted placed on IV Lasix and diuretics.  Cardiology consulted.   Assessment & Plan:   Principal Problem:   Acute on chronic combined systolic and diastolic CHF (congestive heart failure) (HCC) Active Problems:   Essential hypertension   Memory loss   Permanent atrial fibrillation (HCC)   Diabetes mellitus with diabetic neuropathy (HCC)   Hypothyroidism   Edema   Hematuria  #1 acute on chronic combined systolic and diastolic heart failure Patient presented with worsening shortness of breath, worsening lower extremity edema, productive cough of whitish yellowish sputum, elevated BNP and chest x-ray with concerns for volume overload.  Likely secondary to medical noncompliance as per son patient did not take her diuretics for approximately 5 days secondary to polyuria.  Patient denies any chest pain.  EKG with poor voltage, A. fib, some nonspecific T wave changes.  Cardiac enzymes elevated at 1.05-0.93-0.85 likely demand ischemia secondary to acute CHF exacerbation.  2D echo pending.  Continue current regimen of Lasix 40 mg IV every 12 hours, atenolol, Cardizem.  Strict I's and O's.  Daily weights.  2.  Hypertension Continue atenolol and Cardizem.  3.  Atrial fibrillation Continue home regimen of atenolol and Cardizem for rate control.  INR  of 2.46.  Continue Coumadin for anticoagulation.  4.  Hematuria Urinalysis with RBCs.  Urine cultures pending.  UA pending.  Urine cultures pending.  H&H stable. Follow for now and monitor closely while on anticoagulation.  5.  Diabetes mellitus II Hemoglobin A1c was 6.9.  Continue to hold oral hypoglycemic agents.  Sliding scale insulin.  6.  Hyperlipidemia Continue statin.  7.  Hypothyroidism TSH of 6.902.  Increase home dose Synthroid to 62.5 MCG's daily.  Outpatient follow-up with PCP.    8.  Gastroesophageal reflux disease Continue PPI.  9.  Memory loss/probable early dementia Currently stable.  Follow.     DVT prophylaxis: Coumadin Code Status: DNR Family Communication: Updated patient.  No family at bedside. Disposition Plan: To be determined.  Likely home with home health services.   Consultants:   Cardiology: Dr. Domenic Polite 12/24/2016  Procedures:   2D echo 12/24/2016 pending  Chest x-ray 12/23/2016    Antimicrobials:   None   Subjective: Patient in bed.  Denies any chest pain.  States some improvement with her shortness of breath and cough since admission.  Objective: Vitals:   12/23/16 1910 12/23/16 2100 12/23/16 2136 12/24/16 0408  BP: 132/87  121/82 101/72  Pulse: 86  92 80  Resp: 20  (!) 21 20  Temp: 98.2 F (36.8 C)  98.1 F (36.7 C) (!) 97.4 F (36.3 C)  TempSrc: Oral  Oral Axillary  SpO2: 91%  95% 93%  Weight:  59.2 kg (130 lb 8.2 oz)  59.9 kg (132 lb 0.9 oz)  Height:  5\' 5"  (1.651 m)      Intake/Output Summary (Last 24 hours) at 12/24/2016 1304 Last data filed  at 12/24/2016 0115 Gross per 24 hour  Intake 120 ml  Output 100 ml  Net 20 ml   Filed Weights   12/23/16 2100 12/24/16 0408  Weight: 59.2 kg (130 lb 8.2 oz) 59.9 kg (132 lb 0.9 oz)    Examination:  General exam: Appears calm and comfortable. Respiratory system: Some scattered crackles.  No wheezing.  No rhonchi.  Respiratory effort normal. Cardiovascular  system: S1 & S2 heard, RRR. No JVD, murmurs, rubs, gallops or clicks.  1-2+ bilateral lower extremity edema Gastrointestinal system: Abdomen is nondistended, soft and nontender. No organomegaly or masses felt. Normal bowel sounds heard. Central nervous system: Alert and oriented. No focal neurological deficits. Extremities: Symmetric 5 x 5 power. Skin: No rashes, lesions or ulcers Psychiatry: Judgement and insight appear poor to fair. Mood & affect appropriate.     Data Reviewed: I have personally reviewed following labs and imaging studies  CBC: Recent Labs  Lab 12/22/16 1109 12/23/16 1558 12/23/16 1608  WBC 6.5 8.2  --   NEUTROABS 5.5 6.8  --   HGB 12.9 12.6 14.3  HCT 39.3 37.0 42.0  MCV 97.1 94.1  --   PLT 290.0 284  --    Basic Metabolic Panel: Recent Labs  Lab 12/22/16 1109 12/23/16 1558 12/23/16 1608 12/23/16 1822 12/24/16 0409  NA 142 142 143  --  140  K 3.4* 4.4 3.4*  --  4.1  CL 101 103 102  --  105  CO2 28 25  --   --  22  GLUCOSE 161* 145* 142*  --  144*  BUN 18 23* 21*  --  22*  CREATININE 1.07 1.23* 1.00  --  1.13*  CALCIUM 8.9 9.1  --   --  8.9  MG  --   --   --  1.3* 2.5*   GFR: Estimated Creatinine Clearance: 33.3 mL/min (A) (by C-G formula based on SCr of 1.13 mg/dL (H)). Liver Function Tests: Recent Labs  Lab 12/22/16 1109 12/23/16 1558  AST 14 35  ALT 9 10*  ALKPHOS 78 90  BILITOT 1.3* 1.5*  PROT 6.5 6.8  ALBUMIN 3.8 3.5   No results for input(s): LIPASE, AMYLASE in the last 168 hours. No results for input(s): AMMONIA in the last 168 hours. Coagulation Profile: Recent Labs  Lab 12/22/16 1109 12/23/16 1558 12/24/16 0409  INR 3.0* 2.22 2.46   Cardiac Enzymes: Recent Labs  Lab 12/23/16 1817 12/23/16 2305 12/24/16 0409  TROPONINI 1.05* 0.93* 0.85*   BNP (last 3 results) Recent Labs    12/22/16 1109  PROBNP 2,994.0*   HbA1C: Recent Labs    12/22/16 1109  HGBA1C 6.9*   CBG: Recent Labs  Lab 12/23/16 2140  12/24/16 0757 12/24/16 1141  GLUCAP 173* 139* 180*   Lipid Profile: No results for input(s): CHOL, HDL, LDLCALC, TRIG, CHOLHDL, LDLDIRECT in the last 72 hours. Thyroid Function Tests: Recent Labs    12/23/16 2305  TSH 6.902*   Anemia Panel: No results for input(s): VITAMINB12, FOLATE, FERRITIN, TIBC, IRON, RETICCTPCT in the last 72 hours. Sepsis Labs: No results for input(s): PROCALCITON, LATICACIDVEN in the last 168 hours.  Recent Results (from the past 240 hour(s))  Urine Culture     Status: None   Collection Time: 12/22/16  9:26 AM  Result Value Ref Range Status   MICRO NUMBER: [id number]  Final   SPECIMEN QUALITY: ADEQUATE  Final   Sample Source NOT GIVEN  Final   STATUS: FINAL  Final  Result:   Final    Three or more organisms present, each greater than 10,000 cu/mL. May represent normal flora contamination from external genitalia. No further testing is required.         Radiology Studies: Dg Chest 2 View  Result Date: 12/23/2016 CLINICAL DATA:  Lower extremity swelling. EXAM: CHEST  2 VIEW COMPARISON:  12/22/2016.  02/29/2016. FINDINGS: Cardiomegaly. Bilateral effusions, larger on the left than the right, with basilar volume loss. Venous hypertension with mild interstitial edema. No acute bone finding. IMPRESSION: Fluid overload/ congestive heart failure with bilateral effusions left larger than right, dependent atelectasis, cardiomegaly and mild interstitial edema. Electronically Signed   By: Nelson Chimes M.D.   On: 12/23/2016 15:02        Scheduled Meds: . atenolol  50 mg Oral Daily  . diltiazem  120 mg Oral Daily  . furosemide  40 mg Intravenous Q12H  . insulin aspart  0-9 Units Subcutaneous TID WC  . levothyroxine  62.5 mcg Oral QAC breakfast  . potassium chloride  40 mEq Oral Daily  . sodium chloride flush  3 mL Intravenous Q12H  . vitamin A & D      . warfarin  2 mg Oral ONCE-1800  . Warfarin - Pharmacist Dosing Inpatient   Does not apply q1800    Continuous Infusions: . sodium chloride       LOS: 1 day    Time spent: 35 minutes    Irine Seal, MD [facility] Pager [phone]  If 7PM-7AM, please contact night-coverage [email or web address] Password [email or web address] 12/24/2016, 1:04 PM

## 2016-12-25 ENCOUNTER — Other Ambulatory Visit: Payer: Self-pay

## 2016-12-25 DIAGNOSIS — I5043 Acute on chronic combined systolic (congestive) and diastolic (congestive) heart failure: Secondary | ICD-10-CM

## 2016-12-25 LAB — PROTIME-INR
INR: 2.48
PROTHROMBIN TIME: 26.7 s — AB (ref 11.4–15.2)

## 2016-12-25 LAB — GLUCOSE, CAPILLARY
GLUCOSE-CAPILLARY: 136 mg/dL — AB (ref 65–99)
GLUCOSE-CAPILLARY: 140 mg/dL — AB (ref 65–99)
GLUCOSE-CAPILLARY: 79 mg/dL (ref 65–99)
Glucose-Capillary: 73 mg/dL (ref 65–99)

## 2016-12-25 LAB — BASIC METABOLIC PANEL
ANION GAP: 13 (ref 5–15)
BUN: 23 mg/dL — ABNORMAL HIGH (ref 6–20)
CALCIUM: 9.1 mg/dL (ref 8.9–10.3)
CO2: 24 mmol/L (ref 22–32)
Chloride: 105 mmol/L (ref 101–111)
Creatinine, Ser: 1.15 mg/dL — ABNORMAL HIGH (ref 0.44–1.00)
GFR calc non Af Amer: 42 mL/min — ABNORMAL LOW (ref >60)
GFR, EST AFRICAN AMERICAN: 49 mL/min — AB (ref >60)
GLUCOSE: 87 mg/dL (ref 65–99)
POTASSIUM: 5.9 mmol/L — AB (ref 3.5–5.1)
Sodium: 142 mmol/L (ref 135–145)

## 2016-12-25 LAB — URINE CULTURE

## 2016-12-25 LAB — CBC
HEMATOCRIT: 37.2 % (ref 36.0–46.0)
HEMOGLOBIN: 12.8 g/dL (ref 12.0–15.0)
MCH: 32.5 pg (ref 26.0–34.0)
MCHC: 34.4 g/dL (ref 30.0–36.0)
MCV: 94.4 fL (ref 78.0–100.0)
Platelets: 256 10*3/uL (ref 150–400)
RBC: 3.94 MIL/uL (ref 3.87–5.11)
RDW: 16.8 % — ABNORMAL HIGH (ref 11.5–15.5)
WBC: 7.4 10*3/uL (ref 4.0–10.5)

## 2016-12-25 LAB — HEMOGLOBIN A1C
Hgb A1c MFr Bld: 6.7 % — ABNORMAL HIGH (ref 4.8–5.6)
Mean Plasma Glucose: 146 mg/dL

## 2016-12-25 LAB — POTASSIUM: Potassium: 4 mmol/L (ref 3.5–5.1)

## 2016-12-25 MED ORDER — WARFARIN SODIUM 2 MG PO TABS
2.0000 mg | ORAL_TABLET | Freq: Once | ORAL | Status: AC
  Administered 2016-12-25: 2 mg via ORAL
  Filled 2016-12-25: qty 1

## 2016-12-25 MED ORDER — DILTIAZEM HCL ER COATED BEADS 180 MG PO CP24
180.0000 mg | ORAL_CAPSULE | Freq: Every day | ORAL | Status: DC
  Administered 2016-12-26 – 2016-12-30 (×5): 180 mg via ORAL
  Filled 2016-12-25 (×6): qty 1

## 2016-12-25 NOTE — Progress Notes (Signed)
Progress Note  Patient Name: Michelle Herrera Date of Encounter: 12/25/2016  Primary Cardiologist: Dr. Cristopher Peru  Subjective   States that she feels less short of breath today. No chest pain or palpitations.  Inpatient Medications    Scheduled Meds: . atenolol  50 mg Oral Daily  . diltiazem  120 mg Oral Daily  . furosemide  40 mg Intravenous Q12H  . insulin aspart  0-9 Units Subcutaneous TID WC  . levothyroxine  62.5 mcg Oral QAC breakfast  . sodium chloride flush  3 mL Intravenous Q12H  . Warfarin - Pharmacist Dosing Inpatient   Does not apply q1800   Continuous Infusions: . sodium chloride     PRN Meds: sodium chloride, acetaminophen, bismuth subsalicylate, ondansetron (ZOFRAN) IV, sodium chloride flush, tetrahydrozoline   Vital Signs    Vitals:   12/24/16 0408 12/24/16 1300 12/24/16 2205 12/25/16 0510  BP: 101/72 115/71 138/74 126/78  Pulse: 80 71 95 91  Resp: 20 (!) 24 (!) 23 20  Temp: (!) 97.4 F (36.3 C) (!) 97 F (36.1 C) 98.2 F (36.8 C) 97.8 F (36.6 C)  TempSrc: Axillary Axillary Oral Oral  SpO2: 93% 93% 100% 100%  Weight: 132 lb 0.9 oz (59.9 kg)   131 lb 1.6 oz (59.5 kg)  Height:        Intake/Output Summary (Last 24 hours) at 12/25/2016 0905 Last data filed at 12/25/2016 0703 Gross per 24 hour  Intake -  Output 800 ml  Net -800 ml   Filed Weights   12/23/16 2100 12/24/16 0408 12/25/16 0510  Weight: 130 lb 8.2 oz (59.2 kg) 132 lb 0.9 oz (59.9 kg) 131 lb 1.6 oz (59.5 kg)    Telemetry    Atrial fibrillation. Personally reviewed.  ECG    Tracing from 12/25/2016 shows atrial fibrillation with mild RVR and low voltage  Personally reviewed.  Physical Exam   GEN:  Chronically ill-appearing elderly woman. No acute distress.   Neck: No JVD. Cardiac:  irregularly irregular, 2/6 apical systolic murmur, no gallop.  Respiratory:  No wheezing, decreased breath sounds at the bases. GI: Soft, nontender, bowel sounds present. MS:  SCDs in  place.  Labs    Chemistry Recent Labs  Lab 12/22/16 1109 12/23/16 1558 12/23/16 1608 12/24/16 0409 12/25/16 0558  NA 142 142 143 140 142  K 3.4* 4.4 3.4* 4.1 5.9*  CL 101 103 102 105 105  CO2 28 25  --  22 24  GLUCOSE 161* 145* 142* 144* 87  BUN 18 23* 21* 22* 23*  CREATININE 1.07 1.23* 1.00 1.13* 1.15*  CALCIUM 8.9 9.1  --  8.9 9.1  PROT 6.5 6.8  --   --   --   ALBUMIN 3.8 3.5  --   --   --   AST 14 35  --   --   --   ALT 9 10*  --   --   --   ALKPHOS 78 90  --   --   --   BILITOT 1.3* 1.5*  --   --   --   GFRNONAA  --  39*  --  43* 42*  GFRAA  --  45*  --  50* 49*  ANIONGAP  --  14  --  13 13     Hematology Recent Labs  Lab 12/22/16 1109 12/23/16 1558 12/23/16 1608 12/25/16 0558  WBC 6.5 8.2  --  7.4  RBC 4.05 3.93  --  3.94  HGB  12.9 12.6 14.3 12.8  HCT 39.3 37.0 42.0 37.2  MCV 97.1 94.1  --  94.4  MCH  --  32.1  --  32.5  MCHC 32.7 34.1  --  34.4  RDW 17.1* 16.7*  --  16.8*  PLT 290.0 284  --  256    Cardiac Enzymes Recent Labs  Lab 12/23/16 1817 12/23/16 2305 12/24/16 0409  TROPONINI 1.05* 0.93* 0.85*   No results for input(s): TROPIPOC in the last 168 hours.   BNP Recent Labs  Lab 12/22/16 1109 12/23/16 1558  BNP  --  1,582.2*  PROBNP 2,994.0*  --      Radiology    Dg Chest 2 View  Result Date: 12/23/2016 CLINICAL DATA:  Lower extremity swelling. EXAM: CHEST  2 VIEW COMPARISON:  12/22/2016.  02/29/2016. FINDINGS: Cardiomegaly. Bilateral effusions, larger on the left than the right, with basilar volume loss. Venous hypertension with mild interstitial edema. No acute bone finding. IMPRESSION: Fluid overload/ congestive heart failure with bilateral effusions left larger than right, dependent atelectasis, cardiomegaly and mild interstitial edema. Electronically Signed   By: Nelson Chimes M.D.   On: 12/23/2016 15:02    Cardiac Studies   Echocardiogram 12/24/2016: Study Conclusions  - Left ventricle: The cavity size was normal. Wall  thickness was   normal. Systolic function was mildly reduced. The estimated   ejection fraction was in the range of 45% to 50%. Diffuse   hypokinesis. The study is not technically sufficient to allow   evaluation of LV diastolic function. - Ventricular septum: The contour showed diastolic flattening and   systolic flattening. - Aortic valve: There was trivial regurgitation. - Mitral valve: Mildly thickened leaflets with mild prolapse of the   posterior leaflet. There was moderate to severe regurgitation.   Regurgitant volume (PISA): 50 ml. - Left atrium: The atrium was severely dilated. - Right ventricle: The cavity size was mildly dilated. Systolic   function was mildly reduced. - Right atrium: The atrium was severely dilated. Central venous   pressure (est): 15 mm Hg. - Atrial septum: A patent foramen ovale cannot be excluded. - Tricuspid valve: There was severe regurgitation. - Pulmonary arteries: PA peak pressure: 58 mm Hg (S). - Pericardium, extracardiac: There was a left pleural effusion.  Patient Profile     83 y.o. female with a history of chronic atrial fibrillation, CAD, hypertension, previous stroke, diastolic heart failure, and pulmonary hypertension. She presents with acute on chronic combined heart failure.  Assessment & Plan    1. Acute on chronic combined heart failure. LVEF 45-50% by follow-up echocardiogram. She also has evidence of mild RV dysfunction with pulmonary hypertension, PA SP 58 mmHg and severe tricuspid regurgitation which is more chronic. Net urine output 800 cc last 24 hours on IV Lasix.   2. Chronic atrial fibrillation, heart rates are elevated at this time. She is on combination of atenolol and Cardizem CD. Also on Coumadin for stroke prophylaxis. INR 2.46.  3. Troponin I elevation and flat pattern, peak 1.05. Still suspect secondary to demand ischemia with heart failure rather than definitive ACS. She does not report active chest pain.  4. CAD with  history of BMS to the RCA in 2014 and recurrent NSTEMI in 2015 in the setting of nonobstructive CAD.  Continue IV Lasix at current dose. Labs show hyperkalemia but this is new from yesterday, she is not on supplements, and likely spurious. Would recommend repeat potassium level. Continue atenolol, increase Cardizem CD to 180 mg daily.  Signed, Rozann Lesches, MD  12/25/2016, 9:05 AM

## 2016-12-25 NOTE — Care Management Note (Signed)
Case Management Note  Patient Details  Name: Michelle Herrera MRN: [id number] Date of Birth: January 14, 1930  Subjective/Objective:                  Shortness of Breath, dysuria  Action/Plan: CM spoke with the patient at the bedside. Patient states her son lives with her. She uses a walker. Reports no falls. States she has no issues getting to her physician appointments or with medication compliance.    Expected Discharge Date:  (unknown)               Expected Discharge Plan:  [place]  In-House Referral:     Discharge planning Services  CM Consult  Post Acute Care Choice:    Choice offered to:     DME Arranged:    DME Agency:     HH Arranged:    HH Agency:     Status of Service:  In process, will continue to follow  If discussed at Long Length of Stay Meetings, dates discussed:    Additional Comments:  Apolonio Schneiders, RN 12/25/2016, 11:02 AM

## 2016-12-25 NOTE — Evaluation (Signed)
Occupational Therapy Evaluation Patient Details Name: Michelle Herrera MRN: [id number] DOB: 12/23/1930 Today's Date: 12/25/2016    History of Present Illness  Patient is a 83 year old female history of atrial fibrillation on chronic anticoagulation with Coumadin, coronary artery disease, history of CVA, hypertension, mitral valve regurgitation, diabetes mellitus presented to the ED with worsening lower extremity edema, paroxysmal nocturnal dyspnea, shortness of breath on minimal exertion, cough productive of thick whitish sputum.  And also noted to have not taken her diuretics 4-5 days prior to admission.  Workup on admission concerning for an acute CHF exacerbation.   Clinical Impression   Pt admitted with CHF exacerbation. Pt currently with functional limitations due to the deficits listed below (see OT Problem List). Pt will benefit from skilled OT to increase their safety and independence with ADL and functional mobility for ADL to facilitate discharge to venue listed below.      Follow Up Recommendations  SNF;Home health OT;Supervision/Assistance - 24 hour(depending on A at home)    Equipment Recommendations  Other (comment)    Recommendations for Other Services       Precautions / Restrictions Precautions Precautions: Fall Restrictions Weight Bearing Restrictions: No      Mobility Bed Mobility Overal bed mobility: Needs Assistance Bed Mobility: Supine to Sit Rolling: Min assist            Transfers Overall transfer level: Needs assistance Equipment used: 2 person hand held assist Transfers: Sit to/from [facility] Sit to Stand: Mod assist Stand pivot transfers: Mod assist                ADL either performed or assessed with clinical judgement   ADL Overall ADL's : Needs assistance/impaired Eating/Feeding: Set up;Sitting;Minimal assistance   Grooming: Minimal assistance;Sitting   Upper Body Bathing: Minimal assistance;Sitting    Lower Body Bathing: Moderate assistance;Sit to/from stand;Cueing for sequencing   Upper Body Dressing : Minimal assistance;Sitting   Lower Body Dressing: Moderate assistance;Sit to/from stand;Cueing for sequencing;Cueing for safety   Toilet Transfer: Moderate assistance;BSC;RW;Comfort height toilet   Toileting- Clothing Manipulation and Hygiene: Moderate assistance;Sit to/from stand               Vision Patient Visual Report: No change from baseline              Pertinent Vitals/Pain Pain Assessment: No/denies pain     Hand Dominance     Extremity/Trunk Assessment Upper Extremity Assessment Upper Extremity Assessment: Generalized weakness           Communication Communication Communication: HOH   Cognition Arousal/Alertness: Awake/alert Behavior During Therapy: WFL for tasks assessed/performed Overall Cognitive Status: No family/caregiver present to determine baseline cognitive functioning                                 General Comments: Patient disoriented to time and situation.     General Comments               Home Living Family/patient expects to be discharged to:: Private residence Living Arrangements: Children(Son) Available Help at Discharge: Family Type of Home: House Home Access: Stairs to enter [employer] of Steps: 1 step   Home Layout: One level               Home Equipment: [place] - 2 wheels   Additional Comments: Patient unable to provide information.  Obtained from chart - prior admission.  Prior Functioning/Environment          Comments: Per chart, ambulates with RW.  Patient unable to provide information        OT Problem List: Decreased strength;Decreased activity tolerance;Impaired balance (sitting and/or standing);Decreased safety awareness      OT Treatment/Interventions: Self-care/ADL training;Patient/family education;DME and/or AE instruction    OT Goals(Current goals can be  found in the care plan section)    OT Frequency: Min 2X/week   Barriers to D/C:               AM-PAC PT "6 Clicks" Daily Activity     Outcome Measure Help from another person eating meals?: A Little Help from another person taking care of personal grooming?: A Little Help from another person toileting, which includes using toliet, bedpan, or urinal?: A Lot Help from another person bathing (including washing, rinsing, drying)?: A Lot Help from another person to put on and taking off regular upper body clothing?: A Little Help from another person to put on and taking off regular lower body clothing?: A Lot 6 Click Score: 15   End of Session Equipment Utilized During Treatment: Rolling walker Nurse Communication: Mobility status  Activity Tolerance: Patient tolerated treatment well Patient left: in chair;with call bell/phone within reach;with chair alarm set  OT Visit Diagnosis: Unsteadiness on feet (R26.81);Muscle weakness (generalized) (M62.81)                Time: 1884-1660 OT Time Calculation (min): 19 min Charges:  OT General Charges $OT Visit: 1 Visit OT Evaluation $OT Eval Moderate Complexity: 1 Mod G-Codes:     Kari Baars, [place]  Payton Mccallum D 12/25/2016, 10:38 AM

## 2016-12-25 NOTE — Plan of Care (Signed)
Pt continues to be weak

## 2016-12-25 NOTE — Progress Notes (Addendum)
PROGRESS NOTE    Michelle Herrera  KJZ:[id number] DOB: Nov 23, 1930 DOA: 12/23/2016 PCP: Noralee Space, MD   Brief Narrative:   Patient is a 83 year old female history of atrial fibrillation on chronic anticoagulation with Coumadin, coronary artery disease, history of CVA, hypertension, mitral valve regurgitation, diabetes mellitus presented to the ED with worsening lower extremity edema, paroxysmal nocturnal dyspnea, shortness of breath on minimal exertion, cough productive of thick whitish sputum.  And also noted to have not taken her diuretics 4-5 days prior to admission.  Workup on admission concerning for an acute CHF exacerbation.  Patient admitted placed on IV Lasix and diuretics.  Cardiology consulted.   Assessment & Plan:   Principal Problem:   Acute on chronic combined systolic and diastolic CHF (congestive heart failure) (HCC) Active Problems:   Essential hypertension   Memory loss   Permanent atrial fibrillation (HCC)   Diabetes mellitus with diabetic neuropathy (HCC)   Hypothyroidism   Edema   Hematuria  #1 acute on chronic combined systolic and diastolic heart failure Patient presented with worsening shortness of breath, worsening lower extremity edema, productive cough of whitish yellowish sputum, elevated BNP and chest x-ray with concerns for volume overload.  Likely secondary to medical noncompliance as per son patient did not take her diuretics for approximately 5 days secondary to polyuria.  Patient denies any chest pain.  EKG with poor voltage, A. fib, some nonspecific T wave changes.  Cardiac enzymes elevated at 1.05-0.93-0.85 likely demand ischemia secondary to acute CHF exacerbation.  2D echo with EF of 45-50%, diffuse hypokinesis, trivial aortic valvular regurgitation, moderate to severe mitral valve regurgitation, severely dilated left atrium, severely dilated right atrium, PA peak pressure of 58 mmHg, severe tricuspid valvular regurgitation, severely dilated right  atrium, mildly dilated right ventricle with mildly reduced systolic function.  Patient with a urine output of 1 L so far.  Urine output not properly recorded.  Current weight of 131 pounds. Continue current regimen of Lasix 40 mg IV every 12 hours, atenolol, Cardizem dose has been increased to 180 mg daily per cardiology.  Strict I's and O's.  Daily weights.  Cardiology following and appreciate their input and recommendations.  2.  Hypertension Continue atenolol and Cardizem.  3.  Atrial fibrillation Continue home regimen of atenolol and Cardizem for rate control.  INR of 2.48.  Continue Coumadin for anticoagulation.  4.  Hematuria Urinalysis with RBCs.  Urine cultures negative. H&H stable. Follow for now and monitor closely while on anticoagulation.  5.  Diabetes mellitus II Hemoglobin A1c was 6.9.  CBGs ranging from 75-180. Continue to hold oral hypoglycemic agents.  Sliding scale insulin.  6.  Hyperlipidemia Continue statin.  7.  Hypothyroidism TSH of 6.902.  Increased home dose Synthroid to 62.5 MCG's daily.  Outpatient follow-up with PCP.    8.  Gastroesophageal reflux disease Continue PPI.  9.  Memory loss/probable early dementia Currently stable.  Follow.  10.  Hyperkalemia Patient was on oral potassium supplementation daily.  Discontinue potassium supplementation daily.  Repeat potassium level came back at 4.     DVT prophylaxis: Coumadin Code Status: DNR Family Communication: Updated patient.  No family at bedside. Disposition Plan: To be determined.  Likely home with home health services.   Consultants:   Cardiology: Dr. Domenic Polite 12/24/2016  Procedures:   2D echo 12/24/2016   Chest x-ray 12/23/2016    Antimicrobials:   None   Subjective: Patient sitting up in chair.  Patient states some improvement with shortness of  breath and edema.  Patient denies any chest pain.   Objective: Vitals:   12/24/16 0408 12/24/16 1300 12/24/16 2205 12/25/16  0510  BP: 101/72 115/71 138/74 126/78  Pulse: 80 71 95 91  Resp: 20 (!) 24 (!) 23 20  Temp: (!) 97.4 F (36.3 C) (!) 97 F (36.1 C) 98.2 F (36.8 C) 97.8 F (36.6 C)  TempSrc: Axillary Axillary Oral Oral  SpO2: 93% 93% 100% 100%  Weight: 59.9 kg (132 lb 0.9 oz)   59.5 kg (131 lb 1.6 oz)  Height:        Intake/Output Summary (Last 24 hours) at 12/25/2016 1108 Last data filed at 12/25/2016 0940 Gross per 24 hour  Intake 50 ml  Output 1000 ml  Net -950 ml   Filed Weights   12/23/16 2100 12/24/16 0408 12/25/16 0510  Weight: 59.2 kg (130 lb 8.2 oz) 59.9 kg (132 lb 0.9 oz) 59.5 kg (131 lb 1.6 oz)    Examination:  General exam: Frail.  Cachectic. Respiratory system: Some decreased breath sounds in the bases.  Some scattered crackles.  No wheezing.  No rhonchi.  Respiratory effort normal. Cardiovascular system: S1 & S2 heard, RRR. No JVD, murmurs, rubs, gallops or clicks.  1-2+ bilateral lower extremity edema Gastrointestinal system: Abdomen is soft, nontender, nondistended, positive bowel sounds.  No hepatosplenomegaly. Central nervous system: Alert. No focal neurological deficits. Extremities: Symmetric 5 x 5 power. Skin: No rashes, lesions or ulcers Psychiatry: Judgement and insight appear poor to fair. Mood & affect appropriate.     Data Reviewed: I have personally reviewed following labs and imaging studies  CBC: Recent Labs  Lab 12/22/16 1109 12/23/16 1558 12/23/16 1608 12/25/16 0558  WBC 6.5 8.2  --  7.4  NEUTROABS 5.5 6.8  --   --   HGB 12.9 12.6 14.3 12.8  HCT 39.3 37.0 42.0 37.2  MCV 97.1 94.1  --  94.4  PLT 290.0 284  --  601   Basic Metabolic Panel: Recent Labs  Lab 12/22/16 1109 12/23/16 1558 12/23/16 1608 12/23/16 1822 12/24/16 0409 12/25/16 0558 12/25/16 0954  NA 142 142 143  --  140 142  --   K 3.4* 4.4 3.4*  --  4.1 5.9* 4.0  CL 101 103 102  --  105 105  --   CO2 28 25  --   --  22 24  --   GLUCOSE 161* 145* 142*  --  144* 87  --   BUN  18 23* 21*  --  22* 23*  --   CREATININE 1.07 1.23* 1.00  --  1.13* 1.15*  --   CALCIUM 8.9 9.1  --   --  8.9 9.1  --   MG  --   --   --  1.3* 2.5*  --   --    GFR: Estimated Creatinine Clearance: 32.8 mL/min (A) (by C-G formula based on SCr of 1.15 mg/dL (H)). Liver Function Tests: Recent Labs  Lab 12/22/16 1109 12/23/16 1558  AST 14 35  ALT 9 10*  ALKPHOS 78 90  BILITOT 1.3* 1.5*  PROT 6.5 6.8  ALBUMIN 3.8 3.5   No results for input(s): LIPASE, AMYLASE in the last 168 hours. No results for input(s): AMMONIA in the last 168 hours. Coagulation Profile: Recent Labs  Lab 12/22/16 1109 12/23/16 1558 12/24/16 0409 12/25/16 0831  INR 3.0* 2.22 2.46 2.48   Cardiac Enzymes: Recent Labs  Lab 12/23/16 1817 12/23/16 2305 12/24/16 0409  TROPONINI 1.05* 0.93*  0.85*   BNP (last 3 results) Recent Labs    12/22/16 1109  PROBNP 2,994.0*   HbA1C: Recent Labs    12/22/16 1109  HGBA1C 6.9*   CBG: Recent Labs  Lab 12/23/16 2140 12/24/16 0757 12/24/16 1141 12/24/16 1724 12/24/16 2201  GLUCAP 173* 139* 180* 141* 75   Lipid Profile: No results for input(s): CHOL, HDL, LDLCALC, TRIG, CHOLHDL, LDLDIRECT in the last 72 hours. Thyroid Function Tests: Recent Labs    12/23/16 2305  TSH 6.902*   Anemia Panel: No results for input(s): VITAMINB12, FOLATE, FERRITIN, TIBC, IRON, RETICCTPCT in the last 72 hours. Sepsis Labs: No results for input(s): PROCALCITON, LATICACIDVEN in the last 168 hours.  Recent Results (from the past 240 hour(s))  Urine Culture     Status: None   Collection Time: 12/22/16  9:26 AM  Result Value Ref Range Status   MICRO NUMBER: [id number]  Final   SPECIMEN QUALITY: ADEQUATE  Final   Sample Source NOT GIVEN  Final   STATUS: FINAL  Final   Result:   Final    Three or more organisms present, each greater than 10,000 cu/mL. May represent normal flora contamination from external genitalia. No further testing is required.  Culture, Urine     Status:  Abnormal   Collection Time: 12/24/16  1:06 AM  Result Value Ref Range Status   Specimen Description URINE, CLEAN CATCH  Final   Special Requests NONE  Final   Culture MULTIPLE SPECIES PRESENT, SUGGEST RECOLLECTION (A)  Final   Report Status 12/25/2016 FINAL  Final         Radiology Studies: Dg Chest 2 View  Result Date: 12/23/2016 CLINICAL DATA:  Lower extremity swelling. EXAM: CHEST  2 VIEW COMPARISON:  12/22/2016.  02/29/2016. FINDINGS: Cardiomegaly. Bilateral effusions, larger on the left than the right, with basilar volume loss. Venous hypertension with mild interstitial edema. No acute bone finding. IMPRESSION: Fluid overload/ congestive heart failure with bilateral effusions left larger than right, dependent atelectasis, cardiomegaly and mild interstitial edema. Electronically Signed   By: Nelson Chimes M.D.   On: 12/23/2016 15:02        Scheduled Meds: . atenolol  50 mg Oral Daily  . [START ON 12/26/2016] diltiazem  180 mg Oral Daily  . furosemide  40 mg Intravenous Q12H  . insulin aspart  0-9 Units Subcutaneous TID WC  . levothyroxine  62.5 mcg Oral QAC breakfast  . sodium chloride flush  3 mL Intravenous Q12H  . warfarin  2 mg Oral ONCE-1800  . Warfarin - Pharmacist Dosing Inpatient   Does not apply q1800   Continuous Infusions: . sodium chloride       LOS: 2 days    Time spent: 35 minutes    Irine Seal, MD [facility] Pager [phone]  If 7PM-7AM, please contact night-coverage [email or web address] Password [email or web address] 12/25/2016, 11:08 AM

## 2016-12-25 NOTE — Progress Notes (Signed)
ANTICOAGULATION CONSULT NOTE - Follow Up Consult  Pharmacy Consult for warfarin Indication: hx atrial fibrillation  Allergies  Allergen Reactions  . Peanut-Containing Drug Products Anaphylaxis and Swelling  . Atorvastatin     "MAKES HER FEEL BAD"  . Azithromycin Other (See Comments)    REACTION: pt states INTOL to ZPak  . Flomax [Tamsulosin Hcl] Swelling  . Pioglitazone Other (See Comments)    edema  . Pregabalin Other (See Comments)    REACTION: pt states INTOL to Lyrica  . Sulfonamide Derivatives Swelling    Patient Measurements: Height: 5\' 5"  (165.1 cm) Weight: 131 lb 1.6 oz (59.5 kg) IBW/kg (Calculated) : 57 Heparin Dosing Weight:   Vital Signs: Temp: 97.8 F (36.6 C) (12/16 0510) Temp Source: Oral (12/16 0510) BP: 126/78 (12/16 0510) Pulse Rate: 91 (12/16 0510)  Labs: Recent Labs    12/22/16 1109 12/23/16 1558 12/23/16 1608 12/23/16 1817 12/23/16 2305 12/24/16 0409 12/25/16 0558 12/25/16 0831  HGB 12.9 12.6 14.3  --   --   --  12.8  --   HCT 39.3 37.0 42.0  --   --   --  37.2  --   PLT 290.0 284  --   --   --   --  256  --   LABPROT 31.9* 24.5*  --   --   --  26.5*  --  26.7*  INR 3.0* 2.22  --   --   --  2.46  --  2.48  CREATININE 1.07 1.23* 1.00  --   --  1.13* 1.15*  --   TROPONINI  --   --   --  1.05* 0.93* 0.85*  --   --     Estimated Creatinine Clearance: 32.8 mL/min (A) (by C-G formula based on SCr of 1.15 mg/dL (H)).   Medications:  PTA warfarin regimen: 2mg  daily and nothing on Mon and Thurs (verified with pt's son. He reported that Dr. Jeannine Kitten office called and gave him this new warfarin regimen on 12/14)  Assessment: Patient is an 83 y.o with hx chronic diastolic heart failure and afib on warfarin PTA, presented to the ED on 12/23/16 with c/o SOB.  Warfarin resumed for patient on admission.  Today, 12/25/2016: - INR is therapeutic and stable at 2.48 - cbc stable - RN reported "tea" color urine this AM - no significant drug-drug  intxns  Goal of Therapy:  INR 2-3 Monitor platelets by anticoagulation protocol: Yes   Plan:  - repeat warfarin 2 mg PO x1 today - monitor for s/s bleeding  Royann Wildasin P 12/25/2016,10:58 AM

## 2016-12-26 DIAGNOSIS — I10 Essential (primary) hypertension: Secondary | ICD-10-CM

## 2016-12-26 LAB — BASIC METABOLIC PANEL
Anion gap: 12 (ref 5–15)
BUN: 23 mg/dL — ABNORMAL HIGH (ref 6–20)
CALCIUM: 8.8 mg/dL — AB (ref 8.9–10.3)
CO2: 28 mmol/L (ref 22–32)
CREATININE: 1.13 mg/dL — AB (ref 0.44–1.00)
Chloride: 102 mmol/L (ref 101–111)
GFR, EST AFRICAN AMERICAN: 50 mL/min — AB (ref >60)
GFR, EST NON AFRICAN AMERICAN: 43 mL/min — AB (ref >60)
GLUCOSE: 129 mg/dL — AB (ref 65–99)
Potassium: 3.7 mmol/L (ref 3.5–5.1)
Sodium: 142 mmol/L (ref 135–145)

## 2016-12-26 LAB — CBC
HEMATOCRIT: 35.6 % — AB (ref 36.0–46.0)
Hemoglobin: 11.8 g/dL — ABNORMAL LOW (ref 12.0–15.0)
MCH: 31.2 pg (ref 26.0–34.0)
MCHC: 33.1 g/dL (ref 30.0–36.0)
MCV: 94.2 fL (ref 78.0–100.0)
Platelets: 221 10*3/uL (ref 150–400)
RBC: 3.78 MIL/uL — ABNORMAL LOW (ref 3.87–5.11)
RDW: 16.3 % — AB (ref 11.5–15.5)
WBC: 5.9 10*3/uL (ref 4.0–10.5)

## 2016-12-26 LAB — GLUCOSE, CAPILLARY
GLUCOSE-CAPILLARY: 123 mg/dL — AB (ref 65–99)
GLUCOSE-CAPILLARY: 138 mg/dL — AB (ref 65–99)
Glucose-Capillary: 106 mg/dL — ABNORMAL HIGH (ref 65–99)
Glucose-Capillary: 186 mg/dL — ABNORMAL HIGH (ref 65–99)

## 2016-12-26 LAB — PROTIME-INR
INR: 2.58
Prothrombin Time: 27.5 seconds — ABNORMAL HIGH (ref 11.4–15.2)

## 2016-12-26 MED ORDER — TRAZODONE HCL 50 MG PO TABS
50.0000 mg | ORAL_TABLET | Freq: Once | ORAL | Status: AC
  Administered 2016-12-26: 50 mg via ORAL
  Filled 2016-12-26: qty 1

## 2016-12-26 NOTE — Progress Notes (Signed)
Patient refused her labs this morning.

## 2016-12-26 NOTE — Progress Notes (Signed)
Occupational Therapy Treatment Patient Details Name: Michelle Herrera MRN: [id number] DOB: [birth year] Today's Date: 12/26/2016    History of present illness  Patient is a 83 year old female history of atrial fibrillation on chronic anticoagulation with Coumadin, coronary artery disease, history of CVA, hypertension, mitral valve regurgitation, diabetes mellitus presented to the ED with worsening lower extremity edema, paroxysmal nocturnal dyspnea, shortness of breath on minimal exertion, cough productive of thick whitish sputum.  And also noted to have not taken her diuretics 4-5 days prior to admission.  Workup on admission concerning for an acute CHF exacerbation.   OT comments  Pt very confused. Insisted on eating with a straw   Pt will need 24/7 A at home.  Communicated this with son   Follow Up Recommendations  Home health OT;Supervision/Assistance - 24 hour(depending on A at home)          Precautions / Restrictions Precautions Precautions: Fall       Mobility Bed Mobility               General bed mobility comments: pt inchair  Transfers Overall transfer level: Needs assistance Equipment used: 1 person hand held assist Transfers: Sit to/from Stand Sit to Stand: Mod assist              Balance Overall balance assessment: Needs assistance         Standing balance support: Bilateral upper extremity supported Standing balance-Leahy Scale: Poor                             ADL either performed or assessed with clinical judgement   ADL Overall ADL's : Needs assistance/impaired Eating/Feeding: Set up;Sitting;Minimal assistance Eating/Feeding Details (indicate cue type and reason): pt eating a straw.  encouraged pt to use spoon and had son encourage her to use a spooon but she continued to use a straw.                                     General ADL Comments: pt very confused this afternoon.  One son present and stated plan was  go home with [facility]. Pts son stated she would be alone during day- explained this was not safe and she would need A with ADL activity . Son was going to talk to his brothers regarding need for increased care at home.                 Cognition Arousal/Alertness: Awake/alert Behavior During Therapy: Anxious Overall Cognitive Status: Impaired/Different from baseline(increased confusion) Area of Impairment: Orientation;Safety/judgement;Problem solving                         Safety/Judgement: Decreased awareness of deficits;Decreased awareness of safety               Prior Functioning/Environment              Frequency  Min 2X/week        Progress Toward Goals  OT Goals(current goals can now be found in the care plan section)  Progress towards OT goals: Progressing toward goals     Plan Discharge plan needs to be updated    Co-evaluation                 AM-PAC PT "6 Clicks" Daily Activity     Outcome Measure  Help from another person eating meals?: A Little Help from another person taking care of personal grooming?: A Little Help from another person toileting, which includes using toliet, bedpan, or urinal?: A Lot Help from another person bathing (including washing, rinsing, drying)?: A Lot Help from another person to put on and taking off regular upper body clothing?: A Little Help from another person to put on and taking off regular lower body clothing?: A Lot 6 Click Score: 15    End of Session Equipment Utilized During Treatment: Rolling walker  OT Visit Diagnosis: Unsteadiness on feet (R26.81);Muscle weakness (generalized) (M62.81)   Activity Tolerance Patient tolerated treatment well   Patient Left in chair;with call bell/phone within reach;with chair alarm set   Nurse Communication Mobility status        Time: 1715-1735 OT Time Calculation (min): 20 min  Charges: OT General Charges $OT Visit: 1 Visit OT Treatments $Self  Care/Home Management : 8-22 mins  [place], [place]   Betsy Pries 12/26/2016, 5:55 PM

## 2016-12-26 NOTE — Progress Notes (Signed)
ANTICOAGULATION CONSULT NOTE - Follow Up Consult  Pharmacy Consult for warfarin Indication: hx atrial fibrillation  Allergies  Allergen Reactions  . Peanut-Containing Drug Products Anaphylaxis and Swelling  . Atorvastatin     "MAKES HER FEEL BAD"  . Azithromycin Other (See Comments)    REACTION: pt states INTOL to ZPak  . Flomax [Tamsulosin Hcl] Swelling  . Pioglitazone Other (See Comments)    edema  . Pregabalin Other (See Comments)    REACTION: pt states INTOL to Lyrica  . Sulfonamide Derivatives Swelling    Patient Measurements: Height: 5\' 5"  (165.1 cm) Weight: 127 lb 13.9 oz (58 kg) IBW/kg (Calculated) : 57 Heparin Dosing Weight:   Vital Signs: Temp: 97.4 F (36.3 C) (12/17 0425) Temp Source: Oral (12/17 0425) BP: 102/71 (12/17 0425) Pulse Rate: 72 (12/17 0425)  Labs: Recent Labs    12/23/16 1558 12/23/16 1608 12/23/16 1817 12/23/16 2305 12/24/16 0409 12/25/16 0558 12/25/16 0831 12/26/16 0733  HGB 12.6 14.3  --   --   --  12.8  --  11.8*  HCT 37.0 42.0  --   --   --  37.2  --  35.6*  PLT 284  --   --   --   --  256  --  221  LABPROT 24.5*  --   --   --  26.5*  --  26.7* 27.5*  INR 2.22  --   --   --  2.46  --  2.48 2.58  CREATININE 1.23* 1.00  --   --  1.13* 1.15*  --  1.13*  TROPONINI  --   --  1.05* 0.93* 0.85*  --   --   --     Estimated Creatinine Clearance: 33.3 mL/min (A) (by C-G formula based on SCr of 1.13 mg/dL (H)).   Medications:  PTA warfarin regimen: 2mg  daily and nothing on Mon and Thurs (verified with pt's son. He reported that Dr. Jeannine Kitten office called and gave him this new warfarin regimen on 12/14)  Assessment: Patient is an 83 y.o with hx chronic diastolic heart failure and afib on warfarin PTA, presented to the ED on 12/23/16 with c/o SOB.  Warfarin resumed for patient on admission.  Today, 12/26/2016:  INR continues to be therapeutic  CBC good  No reported bleeding  Goal of Therapy:  INR 2-3 Monitor platelets by  anticoagulation protocol: Yes   Plan:  1) Will continue with patient's warfarin regimen as per home above - no warfarin today 2) Daily INR   Adrian Saran, PharmD, BCPS Pager [phone] 12/26/2016 9:29 AM

## 2016-12-26 NOTE — Progress Notes (Signed)
PROGRESS NOTE    Michelle Herrera  YQI:[id number] DOB: [birth year] DOA: 12/23/2016 PCP: Noralee Space, MD   Brief Narrative:   Patient is a 83 year old female history of atrial fibrillation on chronic anticoagulation with Coumadin, coronary artery disease, history of CVA, hypertension, mitral valve regurgitation, diabetes mellitus presented to the ED with worsening lower extremity edema, paroxysmal nocturnal dyspnea, shortness of breath on minimal exertion, cough productive of thick whitish sputum.  And also noted to have not taken her diuretics 4-5 days prior to admission.  Workup on admission concerning for an acute CHF exacerbation.  Patient admitted placed on IV Lasix and diuretics.  Cardiology consulted.   Assessment & Plan:   Principal Problem:   Acute on chronic combined systolic and diastolic CHF (congestive heart failure) (HCC) Active Problems:   Essential hypertension   Memory loss   Permanent atrial fibrillation (HCC)   Diabetes mellitus with diabetic neuropathy (HCC)   Hypothyroidism   Edema   Hematuria  #1 acute on chronic combined systolic and diastolic heart failure Patient presented with worsening shortness of breath, worsening lower extremity edema, productive cough of whitish yellowish sputum, elevated BNP and chest x-ray with concerns for volume overload.  Likely secondary to medical noncompliance as per son patient did not take her diuretics for approximately 5 days secondary to polyuria.  Patient denies any chest pain.  EKG with poor voltage, A. fib, some nonspecific T wave changes.  Cardiac enzymes elevated at 1.05-0.93-0.85 likely demand ischemia secondary to acute CHF exacerbation.  2D echo with EF of 45-50%, diffuse hypokinesis, trivial aortic valvular regurgitation, moderate to severe mitral valve regurgitation, severely dilated left atrium, severely dilated right atrium, PA peak pressure of 58 mmHg, severe tricuspid valvular regurgitation, severely dilated right  atrium, mildly dilated right ventricle with mildly reduced systolic function.  Patient with a urine output of 2.325 L over the past 24 hours. Current weight of 127 pounds from 131 pounds. Continue current regimen of Lasix 40 mg IV every 12 hours, atenolol, Cardizem.  Strict I's and O's.  Daily weights.  Cardiology following and appreciate their input and recommendations.  2.  Hypertension Continue atenolol and Cardizem.  3.  Atrial fibrillation Continue home regimen of atenolol and Cardizem for rate control.  INR of 2.58.  Continue Coumadin for anticoagulation per pharmacy.  4.  Hematuria Urinalysis with RBCs.  Urine cultures negative. H&H stable. Follow for now and monitor closely while on anticoagulation.  5.  Diabetes mellitus II Hemoglobin A1c was 6.9.  CBGs ranging from 79-138. Continue to hold oral hypoglycemic agents.  Sliding scale insulin.  6.  Hyperlipidemia Continue statin.  7.  Hypothyroidism TSH of 6.902.  Increased home dose Synthroid to 62.5 MCG's daily.  Will need repeat thyroid function studies done in 4-6 weeks.  Outpatient follow-up with PCP.    8.  Gastroesophageal reflux disease Continue PPI.  9.  Memory loss/probable early dementia Currently stable.  Follow.  10.  Hyperkalemia Patient was on oral potassium supplementation daily.  Discontinued potassium supplementation daily.  Monitor potassium while on diuretics.  Follow.      DVT prophylaxis: Coumadin Code Status: DNR Family Communication: Updated patient.  No family at bedside. Disposition Plan: To be determined.  Likely home with home health services.   Consultants:   Cardiology: Dr. Domenic Polite 12/24/2016  Procedures:   2D echo 12/24/2016   Chest x-ray 12/23/2016    Antimicrobials:   None   Subjective: Patient sitting up in chair.  Patient denies any chest  pain.  Patient states shortness of breath improving.  No nausea or emesis.  Patient states she has not eaten all morning and  nobody ordered her food.   Objective: Vitals:   12/25/16 2137 12/26/16 0017 12/26/16 0425 12/26/16 1314  BP: 102/74  102/71 91/65  Pulse: 94 85 72 95  Resp: 18  18 16   Temp: 98 F (36.7 C)  (!) 97.4 F (36.3 C) (!) 97.5 F (36.4 C)  TempSrc: Oral  Oral Oral  SpO2: 97% 97% 95% 95%  Weight:   58 kg (127 lb 13.9 oz)   Height:        Intake/Output Summary (Last 24 hours) at 12/26/2016 1839 Last data filed at 12/26/2016 1750 Gross per 24 hour  Intake 120 ml  Output 1325 ml  Net -1205 ml   Filed Weights   12/24/16 0408 12/25/16 0510 12/26/16 0425  Weight: 59.9 kg (132 lb 0.9 oz) 59.5 kg (131 lb 1.6 oz) 58 kg (127 lb 13.9 oz)    Examination:  General exam: Frail.  Cachectic. Respiratory system: Left basilar crackles.  No wheezing.  No rhonchi.  Respiratory effort normal. Cardiovascular system: Regular rate and rhythm no murmurs rubs or gallops.  No JVD.  Trace to 1+ bilateral lower extremity edema.  Gastrointestinal system: Abdomen is soft, nontender, nondistended, positive bowel sounds.  No hepatosplenomegaly. Central nervous system: Alert. No focal neurological deficits. Extremities: Symmetric 5 x 5 power. Skin: No rashes, lesions or ulcers Psychiatry: Judgement and insight appear poor to fair. Mood & affect appropriate.     Data Reviewed: I have personally reviewed following labs and imaging studies  CBC: Recent Labs  Lab 12/22/16 1109 12/23/16 1558 12/23/16 1608 12/25/16 0558 12/26/16 0733  WBC 6.5 8.2  --  7.4 5.9  NEUTROABS 5.5 6.8  --   --   --   HGB 12.9 12.6 14.3 12.8 11.8*  HCT 39.3 37.0 42.0 37.2 35.6*  MCV 97.1 94.1  --  94.4 94.2  PLT 290.0 284  --  256 379   Basic Metabolic Panel: Recent Labs  Lab 12/22/16 1109 12/23/16 1558 12/23/16 1608 12/23/16 1822 12/24/16 0409 12/25/16 0558 12/25/16 0954 12/26/16 0733  NA 142 142 143  --  140 142  --  142  K 3.4* 4.4 3.4*  --  4.1 5.9* 4.0 3.7  CL 101 103 102  --  105 105  --  102  CO2 28 25  --    --  22 24  --  28  GLUCOSE 161* 145* 142*  --  144* 87  --  129*  BUN 18 23* 21*  --  22* 23*  --  23*  CREATININE 1.07 1.23* 1.00  --  1.13* 1.15*  --  1.13*  CALCIUM 8.9 9.1  --   --  8.9 9.1  --  8.8*  MG  --   --   --  1.3* 2.5*  --   --   --    GFR: Estimated Creatinine Clearance: 33.3 mL/min (A) (by C-G formula based on SCr of 1.13 mg/dL (H)). Liver Function Tests: Recent Labs  Lab 12/22/16 1109 12/23/16 1558  AST 14 35  ALT 9 10*  ALKPHOS 78 90  BILITOT 1.3* 1.5*  PROT 6.5 6.8  ALBUMIN 3.8 3.5   No results for input(s): LIPASE, AMYLASE in the last 168 hours. No results for input(s): AMMONIA in the last 168 hours. Coagulation Profile: Recent Labs  Lab 12/22/16 1109 12/23/16 1558 12/24/16 0409  12/25/16 0831 12/26/16 0733  INR 3.0* 2.22 2.46 2.48 2.58   Cardiac Enzymes: Recent Labs  Lab 12/23/16 1817 12/23/16 2305 12/24/16 0409  TROPONINI 1.05* 0.93* 0.85*   BNP (last 3 results) Recent Labs    12/22/16 1109  PROBNP 2,994.0*   HbA1C: Recent Labs    12/23/16 2305  HGBA1C 6.7*   CBG: Recent Labs  Lab 12/25/16 2155 12/25/16 2219 12/26/16 0738 12/26/16 1146 12/26/16 1646  GLUCAP 73 79 123* 138* 106*   Lipid Profile: No results for input(s): CHOL, HDL, LDLCALC, TRIG, CHOLHDL, LDLDIRECT in the last 72 hours. Thyroid Function Tests: Recent Labs    12/23/16 2305  TSH 6.902*   Anemia Panel: No results for input(s): VITAMINB12, FOLATE, FERRITIN, TIBC, IRON, RETICCTPCT in the last 72 hours. Sepsis Labs: No results for input(s): PROCALCITON, LATICACIDVEN in the last 168 hours.  Recent Results (from the past 240 hour(s))  Urine Culture     Status: None   Collection Time: 12/22/16  9:26 AM  Result Value Ref Range Status   MICRO NUMBER: [id number]  Final   SPECIMEN QUALITY: ADEQUATE  Final   Sample Source NOT GIVEN  Final   STATUS: FINAL  Final   Result:   Final    Three or more organisms present, each greater than 10,000 cu/mL. May represent  normal flora contamination from external genitalia. No further testing is required.  Culture, Urine     Status: Abnormal   Collection Time: 12/24/16  1:06 AM  Result Value Ref Range Status   Specimen Description URINE, CLEAN CATCH  Final   Special Requests NONE  Final   Culture MULTIPLE SPECIES PRESENT, SUGGEST RECOLLECTION (A)  Final   Report Status 12/25/2016 FINAL  Final         Radiology Studies: No results found.      Scheduled Meds: . atenolol  50 mg Oral Daily  . diltiazem  180 mg Oral Daily  . furosemide  40 mg Intravenous Q12H  . insulin aspart  0-9 Units Subcutaneous TID WC  . levothyroxine  62.5 mcg Oral QAC breakfast  . sodium chloride flush  3 mL Intravenous Q12H  . Warfarin - Pharmacist Dosing Inpatient   Does not apply q1800   Continuous Infusions: . sodium chloride       LOS: 3 days    Time spent: 35 minutes    Irine Seal, MD [facility] Pager [phone]  If 7PM-7AM, please contact night-coverage [email or web address] Password [facility] 12/26/2016, 6:39 PM

## 2016-12-26 NOTE — Progress Notes (Signed)
Physical Therapy Treatment Patient Details Name: Michelle Herrera MRN: [id number] DOB: 1930-11-24 Today's Date: 12/26/2016    History of Present Illness  Patient is a 83 year old female history of atrial fibrillation on chronic anticoagulation with Coumadin, coronary artery disease, history of CVA, hypertension, mitral valve regurgitation, diabetes mellitus presented to the ED with worsening lower extremity edema, paroxysmal nocturnal dyspnea, shortness of breath on minimal exertion, cough productive of thick whitish sputum.  And also noted to have not taken her diuretics 4-5 days prior to admission.  Workup on admission concerning for an acute CHF exacerbation.    PT Comments    Pt agreeable to perform OOB to recliner however did not want to ambulate today.  SpO2 94% on room air with transferring.  Pt cooperative and able to follow commands today.  Follow Up Recommendations  Home health PT;Supervision/Assistance - 24 hour     Equipment Recommendations  None recommended by PT    Recommendations for Other Services       Precautions / Restrictions Precautions Precautions: Fall    Mobility  Bed Mobility Overal bed mobility: Needs Assistance Bed Mobility: Supine to Sit Rolling: Min assist         General bed mobility comments: Verbal cues for technique.  Assist to initiate movement however pt able to complete  Transfers Overall transfer level: Needs assistance Equipment used: 2 person hand held assist Transfers: Sit to/from Stand Sit to Stand: Min assist Stand pivot transfers: Min guard       General transfer comment: assist to rise and steady, pt held out her hands for HHA despite RW placement, cues to hold RW upon standing, pt able to take a few slow short steps over to recliner, SPO2 94% room air  Ambulation/Gait             General Gait Details: pt declined   Science writer    Modified Rankin (Stroke Patients Only)        Balance Overall balance assessment: Needs assistance         Standing balance support: Bilateral upper extremity supported Standing balance-Leahy Scale: Poor                              Cognition Arousal/Alertness: Awake/alert Behavior During Therapy: WFL for tasks assessed/performed                                   General Comments: pt following commands, appropriate behavior during session      Exercises      General Comments        Pertinent Vitals/Pain Pain Assessment: No/denies pain    Home Living                      Prior Function            PT Goals (current goals can now be found in the care plan section) Progress towards PT goals: Progressing toward goals    Frequency    Min 3X/week      PT Plan      Co-evaluation              AM-PAC PT "6 Clicks" Daily Activity  Outcome Measure  Difficulty turning over in bed (including adjusting bedclothes, sheets and blankets)?: A Lot Difficulty  moving from lying on back to sitting on the side of the bed? : A Lot Difficulty sitting down on and standing up from a chair with arms (e.g., wheelchair, bedside commode, etc,.)?: Unable Help needed moving to and from a bed to chair (including a wheelchair)?: A Little Help needed walking in hospital room?: A Little Help needed climbing 3-5 steps with a railing? : A Lot 6 Click Score: 13    End of Session Equipment Utilized During Treatment: Gait belt Activity Tolerance: Patient limited by fatigue Patient left: with call bell/phone within reach;in chair;with chair alarm set Nurse Communication: Mobility status PT Visit Diagnosis: Difficulty in walking, not elsewhere classified (R26.2);Muscle weakness (generalized) (M62.81)     Time: 1610-9604 PT Time Calculation (min) (ACUTE ONLY): 15 min  Charges:  $Therapeutic Activity: 8-22 mins                    G Codes:       Carmelia Bake, PT, DPT 12/26/2016 Pager:  [phone]  York Ram E 12/26/2016, 1:16 PM

## 2016-12-26 NOTE — Progress Notes (Signed)
Progress Note  Patient Name: Michelle Herrera Date of Encounter: 12/26/2016  Primary Cardiologist: Dr. Cristopher Peru  Subjective   Denies any CP or SOB.  She put out 2L yesterda.  Inpatient Medications    Scheduled Meds: . atenolol  50 mg Oral Daily  . diltiazem  180 mg Oral Daily  . furosemide  40 mg Intravenous Q12H  . insulin aspart  0-9 Units Subcutaneous TID WC  . levothyroxine  62.5 mcg Oral QAC breakfast  . sodium chloride flush  3 mL Intravenous Q12H  . Warfarin - Pharmacist Dosing Inpatient   Does not apply q1800   Continuous Infusions: . sodium chloride     PRN Meds: sodium chloride, acetaminophen, bismuth subsalicylate, ondansetron (ZOFRAN) IV, sodium chloride flush, tetrahydrozoline   Vital Signs    Vitals:   12/25/16 1427 12/25/16 2137 12/26/16 0017 12/26/16 0425  BP: 112/66 102/74  102/71  Pulse: 74 94 85 72  Resp: 18 18  18   Temp: 98.6 F (37 C) 98 F (36.7 C)  (!) 97.4 F (36.3 C)  TempSrc: Axillary Oral  Oral  SpO2: 100% 97% 97% 95%  Weight:    127 lb 13.9 oz (58 kg)  Height:        Intake/Output Summary (Last 24 hours) at 12/26/2016 1008 Last data filed at 12/26/2016 0644 Gross per 24 hour  Intake 240 ml  Output 1325 ml  Net -1085 ml   Filed Weights   12/24/16 0408 12/25/16 0510 12/26/16 0425  Weight: 132 lb 0.9 oz (59.9 kg) 131 lb 1.6 oz (59.5 kg) 127 lb 13.9 oz (58 kg)    Telemetry    NSR - Personally Reviewed  ECG    No new EKG to review - Personally Reviewed  Physical Exam   GEN: No acute distress.   Neck: No JVD Cardiac: irregularly irregular, no murmurs, rubs, or gallops.  Respiratory: few crackles at bases that clear with cough GI: Soft, nontender, non-distended  MS: No edema; No deformity. Neuro:  Nonfocal  Psych: Normal affect   Labs    Chemistry Recent Labs  Lab 12/22/16 1109  12/23/16 1558  12/24/16 0409 12/25/16 0558 12/25/16 0954 12/26/16 0733  NA 142  --  142   < > 140 142  --  142  K 3.4*  --   4.4   < > 4.1 5.9* 4.0 3.7  CL 101  --  103   < > 105 105  --  102  CO2 28  --  25  --  22 24  --  28  GLUCOSE 161*  --  145*   < > 144* 87  --  129*  BUN 18  --  23*   < > 22* 23*  --  23*  CREATININE 1.07  --  1.23*   < > 1.13* 1.15*  --  1.13*  CALCIUM 8.9  --  9.1  --  8.9 9.1  --  8.8*  PROT 6.5  --  6.8  --   --   --   --   --   ALBUMIN 3.8  --  3.5  --   --   --   --   --   AST 14  --  35  --   --   --   --   --   ALT 9  --  10*  --   --   --   --   --   ALKPHOS 78  --  90  --   --   --   --   --   BILITOT 1.3*  --  1.5*  --   --   --   --   --   GFRNONAA  --    < > 39*  --  43* 42*  --  43*  GFRAA  --    < > 45*  --  50* 49*  --  50*  ANIONGAP  --    < > 14  --  13 13  --  12   < > = values in this interval not displayed.     Hematology Recent Labs  Lab 12/23/16 1558 12/23/16 1608 12/25/16 0558 12/26/16 0733  WBC 8.2  --  7.4 5.9  RBC 3.93  --  3.94 3.78*  HGB 12.6 14.3 12.8 11.8*  HCT 37.0 42.0 37.2 35.6*  MCV 94.1  --  94.4 94.2  MCH 32.1  --  32.5 31.2  MCHC 34.1  --  34.4 33.1  RDW 16.7*  --  16.8* 16.3*  PLT 284  --  256 221    Cardiac Enzymes Recent Labs  Lab 12/23/16 1817 12/23/16 2305 12/24/16 0409  TROPONINI 1.05* 0.93* 0.85*   No results for input(s): TROPIPOC in the last 168 hours.   BNP Recent Labs  Lab 12/22/16 1109 12/23/16 1558  BNP  --  1,582.2*  PROBNP 2,994.0*  --      DDimer No results for input(s): DDIMER in the last 168 hours.   Radiology    No results found.  Cardiac Studies   Echocardiogram 12/24/2016: Study Conclusions  - Left ventricle: The cavity size was normal. Wall thickness was normal. Systolic function was mildly reduced. The estimated ejection fraction was in the range of 45% to 50%. Diffuse hypokinesis. The study is not technically sufficient to allow evaluation of LV diastolic function. - Ventricular septum: The contour showed diastolic flattening and systolic flattening. - Aortic valve:  There was trivial regurgitation. - Mitral valve: Mildly thickened leaflets with mild prolapse of the posterior leaflet. There was moderate to severe regurgitation. Regurgitant volume (PISA): 50 ml. - Left atrium: The atrium was severely dilated. - Right ventricle: The cavity size was mildly dilated. Systolic function was mildly reduced. - Right atrium: The atrium was severely dilated. Central venous pressure (est): 15 mm Hg. - Atrial septum: A patent foramen ovale cannot be excluded. - Tricuspid valve: There was severe regurgitation. - Pulmonary arteries: PA peak pressure: 58 mm Hg (S). - Pericardium, extracardiac: There was a left pleural effusion.   Patient Profile     83 y.o. female with a history of chronic atrial fibrillation, CAD, hypertension, previous stroke, diastolic heart failure, and pulmonary hypertension. She presents with acute on chronic combined heart failure  Assessment & Plan    1. Acute on chronic combined systolic/diastolic heart failure. LVEF 45-50% by follow-up echocardiogram. She also has evidence of mild RV dysfunction with pulmonary hypertension, PA SP 58 mmHg and severe tricuspid regurgitation which is more chronic.  - pulmonary HTN likely Group 2 secondary to pulmonary venous HTN from CHF. - she put out 2L yesterday and is net neg 2L.   - weight is down 3lbs from admit and 5lbs from 12/15. - creatinine stable at 1.13. - she has a few crackles at bases which clear with cough.  - continue IV Lasix at least 1 more day.  2. Chronic atrial fibrillation - HR fairly well controlled in 80-90's. - continue Atenolol  50mg  daily and Cardizem CD 180mg  daily - continue warfarin per pharmacy - INR therapeutic at 2.58.  3. Troponin I elevation and flat pattern, peak 1.05. Still suspect secondary to demand ischemia with heart failure rather than definitive ACS. She does not report active chest pain.  4. CAD with history of BMS to the RCA in 2014 and recurrent  NSTEMI in 2015 in the setting of nonobstructive CAD. - no ASA due to warfarin - continue BB    For questions or updates, please contact [place] Please consult [email or web address] for contact info under Cardiology/STEMI.      Signed, Fransico Him, MD  12/26/2016, 10:08 AM

## 2016-12-26 NOTE — Plan of Care (Signed)
  Progressing Education: Knowledge of General Education information will improve 12/26/2016 0834 - Progressing by Letha Mirabal, Royetta Crochet, RN Health Behavior/Discharge Planning: Ability to manage health-related needs will improve 12/26/2016 0834 - Progressing by Kiki Bivens, Royetta Crochet, RN Clinical Measurements: Ability to maintain clinical measurements within normal limits will improve 12/26/2016 0834 - Progressing by Didi Ganaway, Royetta Crochet, RN Will remain free from infection 12/26/2016 0834 - Progressing by Gweneth Fredlund, Royetta Crochet, RN Diagnostic test results will improve 12/26/2016 0834 - Progressing by Emett Stapel, Royetta Crochet, RN Respiratory complications will improve 12/26/2016 0834 - Progressing by Jerren Flinchbaugh, Royetta Crochet, RN Cardiovascular complication will be avoided 12/26/2016 0834 - Progressing by Treesa Mccully, Royetta Crochet, RN Activity: Risk for activity intolerance will decrease 12/26/2016 0834 - Progressing by Maryama Kuriakose, Royetta Crochet, RN Nutrition: Adequate nutrition will be maintained 12/26/2016 0834 - Progressing by Jere Bostrom, Royetta Crochet, RN Coping: Level of anxiety will decrease 12/26/2016 0834 - Progressing by Nirvi Boehler, Royetta Crochet, RN Elimination: Will not experience complications related to bowel motility 12/26/2016 0834 - Progressing by Keawe Marcello, Royetta Crochet, RN

## 2016-12-27 DIAGNOSIS — R609 Edema, unspecified: Secondary | ICD-10-CM

## 2016-12-27 LAB — GLUCOSE, CAPILLARY
GLUCOSE-CAPILLARY: 150 mg/dL — AB (ref 65–99)
GLUCOSE-CAPILLARY: 193 mg/dL — AB (ref 65–99)
GLUCOSE-CAPILLARY: 165 mg/dL — AB (ref 65–99)
Glucose-Capillary: 159 mg/dL — ABNORMAL HIGH (ref 65–99)

## 2016-12-27 LAB — BASIC METABOLIC PANEL
ANION GAP: 12 (ref 5–15)
BUN: 24 mg/dL — AB (ref 6–20)
CHLORIDE: 102 mmol/L (ref 101–111)
CO2: 25 mmol/L (ref 22–32)
Calcium: 8.6 mg/dL — ABNORMAL LOW (ref 8.9–10.3)
Creatinine, Ser: 1.13 mg/dL — ABNORMAL HIGH (ref 0.44–1.00)
GFR calc Af Amer: 50 mL/min — ABNORMAL LOW (ref >60)
GFR calc non Af Amer: 43 mL/min — ABNORMAL LOW (ref >60)
GLUCOSE: 133 mg/dL — AB (ref 65–99)
POTASSIUM: 3.7 mmol/L (ref 3.5–5.1)
Sodium: 139 mmol/L (ref 135–145)

## 2016-12-27 LAB — PROTIME-INR
INR: 2.48
Prothrombin Time: 26.6 seconds — ABNORMAL HIGH (ref 11.4–15.2)

## 2016-12-27 MED ORDER — WARFARIN SODIUM 2 MG PO TABS
2.0000 mg | ORAL_TABLET | Freq: Once | ORAL | Status: AC
  Administered 2016-12-27: 2 mg via ORAL
  Filled 2016-12-27: qty 1

## 2016-12-27 MED ORDER — ATENOLOL 50 MG PO TABS
50.0000 mg | ORAL_TABLET | Freq: Every day | ORAL | Status: DC
  Administered 2016-12-28 – 2016-12-30 (×3): 50 mg via ORAL
  Filled 2016-12-27 (×4): qty 1

## 2016-12-27 MED ORDER — FUROSEMIDE 10 MG/ML IJ SOLN: 80.0000 mg | Freq: Two times a day (BID) | INTRAMUSCULAR | Status: DC

## 2016-12-27 MED ORDER — FUROSEMIDE 10 MG/ML IJ SOLN
40.0000 mg | Freq: Three times a day (TID) | INTRAMUSCULAR | Status: DC
  Administered 2016-12-27 – 2016-12-28 (×5): 40 mg via INTRAVENOUS
  Filled 2016-12-27 (×6): qty 4

## 2016-12-27 MED ORDER — ATENOLOL 25 MG PO TABS: 25.0000 mg | ORAL_TABLET | Freq: Every day | ORAL | Status: DC

## 2016-12-27 NOTE — Care Management Important Message (Signed)
Important Message  Patient Details  Name: Michelle Herrera MRN: [id number] Date of Birth: 1930/05/16   Medicare Important Message Given:  Yes    Kerin Salen 12/27/2016, 11:37  AM  Important Message  Patient Details  Name: Michelle Herrera MRN: [id number] Date of Birth: 04-Aug-1930   Medicare Important Message Given:  Yes    Kerin Salen 12/27/2016, 11:30 AMImportant Message  Patient Details  Name: Michelle Herrera MRN: [id number] Date of Birth: August 25, 1930   Medicare Important Message Given:  Yes    Kerin Salen 12/27/2016, 11:29 AM

## 2016-12-27 NOTE — Progress Notes (Signed)
ANTICOAGULATION CONSULT NOTE - Follow Up Consult  Pharmacy Consult for warfarin Indication: hx atrial fibrillation  Allergies  Allergen Reactions  . Peanut-Containing Drug Products Anaphylaxis and Swelling  . Atorvastatin     "MAKES HER FEEL BAD"  . Azithromycin Other (See Comments)    REACTION: pt states INTOL to ZPak  . Flomax [Tamsulosin Hcl] Swelling  . Pioglitazone Other (See Comments)    edema  . Pregabalin Other (See Comments)    REACTION: pt states INTOL to Lyrica  . Sulfonamide Derivatives Swelling    Patient Measurements: Height: 5\' 5"  (165.1 cm) Weight: 130 lb 1.1 oz (59 kg) IBW/kg (Calculated) : 57 Heparin Dosing Weight:   Vital Signs: Temp: 97.5 F (36.4 C) (12/18 0446) Temp Source: Oral (12/18 0446) BP: 100/73 (12/18 0446) Pulse Rate: 69 (12/18 0446)  Labs: Recent Labs    12/25/16 0558 12/25/16 0831 12/26/16 0733 12/27/16 0431  HGB 12.8  --  11.8*  --   HCT 37.2  --  35.6*  --   PLT 256  --  221  --   LABPROT  --  26.7* 27.5* 26.6*  INR  --  2.48 2.58 2.48  CREATININE 1.15*  --  1.13* 1.13*    Estimated Creatinine Clearance: 33.3 mL/min (A) (by C-G formula based on SCr of 1.13 mg/dL (H)).   Medications:  PTA warfarin regimen: 2mg  daily and nothing on Mon and Thurs (verified with pt's son. He reported that Dr. Jeannine Kitten office called and gave him this new warfarin regimen on 12/14)  Assessment: Patient is an 83 y.o with hx chronic diastolic heart failure and afib on warfarin PTA, presented to the ED on 12/23/16 with c/o SOB.  Warfarin resumed for patient on admission.  Today, 12/27/2016:  INR continues to be therapeutic  CBC good  No reported bleeding  Goal of Therapy:  INR 2-3 Monitor platelets by anticoagulation protocol: Yes   Plan:  1) Will continue with patient's warfarin regimen as per home above - 2mg  today 2) Daily INR   Adrian Saran, PharmD, BCPS Pager [phone] 12/27/2016 9:24 AM

## 2016-12-27 NOTE — Progress Notes (Signed)
Progress Note  Patient Name: Michelle Herrera Date of Encounter: 12/27/2016  Primary Cardiologist: Dr. Cristopher Peru  Subjective   Feeling better.  Denies any CP or SOB  Inpatient Medications    Scheduled Meds: . atenolol  50 mg Oral Daily  . diltiazem  180 mg Oral Daily  . furosemide  40 mg Intravenous Q12H  . insulin aspart  0-9 Units Subcutaneous TID WC  . levothyroxine  62.5 mcg Oral QAC breakfast  . sodium chloride flush  3 mL Intravenous Q12H  . warfarin  2 mg Oral ONCE-1800  . Warfarin - Pharmacist Dosing Inpatient   Does not apply q1800   Continuous Infusions: . sodium chloride     PRN Meds: sodium chloride, acetaminophen, bismuth subsalicylate, ondansetron (ZOFRAN) IV, sodium chloride flush, tetrahydrozoline   Vital Signs    Vitals:   12/26/16 1314 12/26/16 2014 12/27/16 0446 12/27/16 0937  BP: 91/65 109/73 100/73 108/65  Pulse: 95 75 69 74  Resp: 16 18 18    Temp: (!) 97.5 F (36.4 C) 97.6 F (36.4 C) (!) 97.5 F (36.4 C)   TempSrc: Oral Oral Oral   SpO2: 95% 95% 97%   Weight:   130 lb 1.1 oz (59 kg)   Height:        Intake/Output Summary (Last 24 hours) at 12/27/2016 1130 Last data filed at 12/27/2016 0704 Gross per 24 hour  Intake 180 ml  Output 475 ml  Net -295 ml   Filed Weights   12/25/16 0510 12/26/16 0425 12/27/16 0446  Weight: 131 lb 1.6 oz (59.5 kg) 127 lb 13.9 oz (58 kg) 130 lb 1.1 oz (59 kg)    Telemetry    NSR - Personally Reviewed  ECG    No new EKG to review - Personally Reviewed  Physical Exam   GEN: No acute distress.   Neck: No JVD Cardiac: irregularly irregular, no murmurs, rubs, or gallops.  Respiratory: Clear to auscultation bilaterally. GI: Soft, nontender, non-distended  MS: 2-3+ LE edema; No deformity. Neuro:  Nonfocal  Psych: Normal affect   Labs    Chemistry Recent Labs  Lab 12/22/16 1109 12/23/16 1558  12/25/16 0558 12/25/16 0954 12/26/16 0733 12/27/16 0431  NA 142 142   < > 142  --  142 139   K 3.4* 4.4   < > 5.9* 4.0 3.7 3.7  CL 101 103   < > 105  --  102 102  CO2 28 25   < > 24  --  28 25  GLUCOSE 161* 145*   < > 87  --  129* 133*  BUN 18 23*   < > 23*  --  23* 24*  CREATININE 1.07 1.23*   < > 1.15*  --  1.13* 1.13*  CALCIUM 8.9 9.1   < > 9.1  --  8.8* 8.6*  PROT 6.5 6.8  --   --   --   --   --   ALBUMIN 3.8 3.5  --   --   --   --   --   AST 14 35  --   --   --   --   --   ALT 9 10*  --   --   --   --   --   ALKPHOS 78 90  --   --   --   --   --   BILITOT 1.3* 1.5*  --   --   --   --   --  GFRNONAA  --  39*   < > 42*  --  43* 43*  GFRAA  --  45*   < > 49*  --  50* 50*  ANIONGAP  --  14   < > 13  --  12 12   < > = values in this interval not displayed.     Hematology Recent Labs  Lab 12/23/16 1558 12/23/16 1608 12/25/16 0558 12/26/16 0733  WBC 8.2  --  7.4 5.9  RBC 3.93  --  3.94 3.78*  HGB 12.6 14.3 12.8 11.8*  HCT 37.0 42.0 37.2 35.6*  MCV 94.1  --  94.4 94.2  MCH 32.1  --  32.5 31.2  MCHC 34.1  --  34.4 33.1  RDW 16.7*  --  16.8* 16.3*  PLT 284  --  256 221    Cardiac Enzymes Recent Labs  Lab 12/23/16 1817 12/23/16 2305 12/24/16 0409  TROPONINI 1.05* 0.93* 0.85*   No results for input(s): TROPIPOC in the last 168 hours.   BNP Recent Labs  Lab 12/22/16 1109 12/23/16 1558  BNP  --  1,582.2*  PROBNP 2,994.0*  --      DDimer No results for input(s): DDIMER in the last 168 hours.   Radiology    No results found.  Cardiac Studies   Echocardiogram 12/24/2016: Study Conclusions  - Left ventricle: The cavity size was normal. Wall thickness was normal. Systolic function was mildly reduced. The estimated ejection fraction was in the range of 45% to 50%. Diffuse hypokinesis. The study is not technically sufficient to allow evaluation of LV diastolic function. - Ventricular septum: The contour showed diastolic flattening and systolic flattening. - Aortic valve: There was trivial regurgitation. - Mitral valve: Mildly thickened  leaflets with mild prolapse of the posterior leaflet. There was moderate to severe regurgitation. Regurgitant volume (PISA): 50 ml. - Left atrium: The atrium was severely dilated. - Right ventricle: The cavity size was mildly dilated. Systolic function was mildly reduced. - Right atrium: The atrium was severely dilated. Central venous pressure (est): 15 mm Hg. - Atrial septum: A patent foramen ovale cannot be excluded. - Tricuspid valve: There was severe regurgitation. - Pulmonary arteries: PA peak pressure: 58 mm Hg (S). - Pericardium, extracardiac: There was a left pleural effusion.    Patient Profile     83 y.o. female with a history ofchronic atrial fibrillation, CAD, hypertension, previous stroke, diastolic heart failure, and pulmonary hypertension.She presents with acute on chronic combined heart failure   Assessment & Plan    1.Acute on chronic combined systolic/diastolic heart failure. LVEF 45-50% by follow-up echocardiogram. She also has evidence of mild RV dysfunction with pulmonary hypertension, PA SP 58 mmHg and severe tricuspid regurgitation which is more chronic.  - pulmonary HTN likely Group 2 secondary to pulmonary venous HTN from CHF. - she put out 2.3L yesterday and is net neg 2.1L.   - weight has been very erratic and unsure of accuracy - creatinine stable at 1.13. - she still has significant LE edema - increase Lasix to 40mg  IV TID as BP is soft  - add compression hose - not sure how much the MR is playing a role in her CHF  2.Chronic atrial fibrillation - HR fairly well controlled in 80-90's. - continue Atenolol 50mg  daily and Cardizem CD 180mg  daily - continue warfarin per pharmacy - INR therapeutic at 2.48.  3.Troponin I elevation and flat pattern, peak 1.05.Still suspect secondary to demand ischemia with heart failure rather than  definitive ACS. She does not report active chest pain.  4.CAD with history of BMS to the RCA in 2014 and  recurrentNSTEMI in 2015 in the setting of nonobstructive CAD. - no ASA due to warfarin - continue BB   For questions or updates, please contact [place] Please consult [email or web address] for contact info under Cardiology/STEMI.      Signed, Fransico Him, MD  12/27/2016, 11:30 AM

## 2016-12-27 NOTE — Progress Notes (Signed)
PROGRESS NOTE    Michelle Herrera  ZHG:[id number] DOB: [birth year] DOA: 12/23/2016 PCP: Noralee Space, MD   Brief Narrative:   Patient is a 83 year old female history of atrial fibrillation on chronic anticoagulation with Coumadin, coronary artery disease, history of CVA, hypertension, mitral valve regurgitation, diabetes mellitus presented to the ED with worsening lower extremity edema, paroxysmal nocturnal dyspnea, shortness of breath on minimal exertion, cough productive of thick whitish sputum.  And also noted to have not taken her diuretics 4-5 days prior to admission.  Workup on admission concerning for an acute CHF exacerbation.  Patient admitted placed on IV Lasix and diuretics.  Cardiology consulted.   Assessment & Plan:   Acute on chronic combined systolic and diastolic heart failure -History of poor compliance with diuretics recently -Echo with EF of 45-50% also has pulmonary hypertension and severe tricuspid regurgitation -On IV Lasix, BP soft still has 1-2+ lower extremity edema which could be partly from venous stasis -Cardiology following -Weight seems inaccurate, monitor I/O closely she is net -2 L    Hypertension Continue atenolol and Cardizem.   Atrial fibrillation In sinus rhythm, continue atenolol and Cardizem -Continue Coumadin for anticoagulation per pharmacy.   Hematuria -Urinalysis with RBCs.  Urine cultures negative. H&H stable.  -Urine clearing, monitor   Diabetes mellitus II Hemoglobin A1c was 6.9.  CBGs ranging from 79-138. Continue to hold oral hypoglycemic agents.  Sliding scale insulin.  Hypothyroidism TSH of 6.902.  Increased home dose Synthroid to 62.5 MCG's daily.  Will need repeat thyroid function studies done in 4-6 weeks.  Outpatient follow-up with PCP.     Memory loss/probable early dementia Currently stable.  Follow.  DVT prophylaxis: Coumadin Code Status: DNR Family Communication: Updated patient.  No family at  bedside. Disposition Plan:Home with home health services in 24-48 hours  Consultants:   Cardiology: Dr. Domenic Polite 12/24/2016  Procedures:   2D echo 12/24/2016   Chest x-ray 12/23/2016    Antimicrobials:   None   Subjective: -Breathing better, sitting up in her chair eating breakfast, and back to baseline yet  Objective: Vitals:   12/26/16 1314 12/26/16 2014 12/27/16 0446 12/27/16 0937  BP: 91/65 109/73 100/73 108/65  Pulse: 95 75 69 74  Resp: 16 18 18    Temp: (!) 97.5 F (36.4 C) 97.6 F (36.4 C) (!) 97.5 F (36.4 C)   TempSrc: Oral Oral Oral   SpO2: 95% 95% 97%   Weight:   59 kg (130 lb 1.1 oz)   Height:        Intake/Output Summary (Last 24 hours) at 12/27/2016 1504 Last data filed at 12/27/2016 0704 Gross per 24 hour  Intake 180 ml  Output 475 ml  Net -295 ml   Filed Weights   12/25/16 0510 12/26/16 0425 12/27/16 0446  Weight: 59.5 kg (131 lb 1.6 oz) 58 kg (127 lb 13.9 oz) 59 kg (130 lb 1.1 oz)    Examination:  Gen: Awake, Alert, Oriented X 2, elderly frail female  HEENT: PERRLA, Neck supple, + JVD Lungs:Fine basilar crackles  CVS: RRR,No Gallops,Rubs or new Murmurs Abd: soft, Non tender, non distended, BS present Extremities:1+ edema at the ankle  Skin: no new rashes  Data Reviewed: I have personally reviewed following labs and imaging studies  CBC: Recent Labs  Lab 12/22/16 1109 12/23/16 1558 12/23/16 1608 12/25/16 0558 12/26/16 0733  WBC 6.5 8.2  --  7.4 5.9  NEUTROABS 5.5 6.8  --   --   --   HGB 12.9  12.6 14.3 12.8 11.8*  HCT 39.3 37.0 42.0 37.2 35.6*  MCV 97.1 94.1  --  94.4 94.2  PLT 290.0 284  --  256 563   Basic Metabolic Panel: Recent Labs  Lab 12/23/16 1558 12/23/16 1608 12/23/16 1822 12/24/16 0409 12/25/16 0558 12/25/16 0954 12/26/16 0733 12/27/16 0431  NA 142 143  --  140 142  --  142 139  K 4.4 3.4*  --  4.1 5.9* 4.0 3.7 3.7  CL 103 102  --  105 105  --  102 102  CO2 25  --   --  22 24  --  28 25  GLUCOSE 145*  142*  --  144* 87  --  129* 133*  BUN 23* 21*  --  22* 23*  --  23* 24*  CREATININE 1.23* 1.00  --  1.13* 1.15*  --  1.13* 1.13*  CALCIUM 9.1  --   --  8.9 9.1  --  8.8* 8.6*  MG  --   --  1.3* 2.5*  --   --   --   --    GFR: Estimated Creatinine Clearance: 33.3 mL/min (A) (by C-G formula based on SCr of 1.13 mg/dL (H)). Liver Function Tests: Recent Labs  Lab 12/22/16 1109 12/23/16 1558  AST 14 35  ALT 9 10*  ALKPHOS 78 90  BILITOT 1.3* 1.5*  PROT 6.5 6.8  ALBUMIN 3.8 3.5   No results for input(s): LIPASE, AMYLASE in the last 168 hours. No results for input(s): AMMONIA in the last 168 hours. Coagulation Profile: Recent Labs  Lab 12/23/16 1558 12/24/16 0409 12/25/16 0831 12/26/16 0733 12/27/16 0431  INR 2.22 2.46 2.48 2.58 2.48   Cardiac Enzymes: Recent Labs  Lab 12/23/16 1817 12/23/16 2305 12/24/16 0409  TROPONINI 1.05* 0.93* 0.85*   BNP (last 3 results) Recent Labs    12/22/16 1109  PROBNP 2,994.0*   HbA1C: No results for input(s): HGBA1C in the last 72 hours. CBG: Recent Labs  Lab 12/26/16 1146 12/26/16 1646 12/26/16 2042 12/27/16 0748 12/27/16 1201  GLUCAP 138* 106* 186* 150* 193*   Lipid Profile: No results for input(s): CHOL, HDL, LDLCALC, TRIG, CHOLHDL, LDLDIRECT in the last 72 hours. Thyroid Function Tests: No results for input(s): TSH, T4TOTAL, FREET4, T3FREE, THYROIDAB in the last 72 hours. Anemia Panel: No results for input(s): VITAMINB12, FOLATE, FERRITIN, TIBC, IRON, RETICCTPCT in the last 72 hours. Sepsis Labs: No results for input(s): PROCALCITON, LATICACIDVEN in the last 168 hours.  Recent Results (from the past 240 hour(s))  Urine Culture     Status: None   Collection Time: 12/22/16  9:26 AM  Result Value Ref Range Status   MICRO NUMBER: [id number]  Final   SPECIMEN QUALITY: ADEQUATE  Final   Sample Source NOT GIVEN  Final   STATUS: FINAL  Final   Result:   Final    Three or more organisms present, each greater than 10,000  cu/mL. May represent normal flora contamination from external genitalia. No further testing is required.  Culture, Urine     Status: Abnormal   Collection Time: 12/24/16  1:06 AM  Result Value Ref Range Status   Specimen Description URINE, CLEAN CATCH  Final   Special Requests NONE  Final   Culture MULTIPLE SPECIES PRESENT, SUGGEST RECOLLECTION (A)  Final   Report Status 12/25/2016 FINAL  Final         Radiology Studies: No results found.      Scheduled Meds: . [START ON  12/28/2016] atenolol  50 mg Oral Daily  . diltiazem  180 mg Oral Daily  . furosemide  40 mg Intravenous TID  . insulin aspart  0-9 Units Subcutaneous TID WC  . levothyroxine  62.5 mcg Oral QAC breakfast  . sodium chloride flush  3 mL Intravenous Q12H  . warfarin  2 mg Oral ONCE-1800  . Warfarin - Pharmacist Dosing Inpatient   Does not apply q1800   Continuous Infusions: . sodium chloride       LOS: 4 days    Time spent: 35 minutes    Domenic Polite, MD [facility] Page via [email or web address] password [email or web address]  If 7PM-7AM, please contact night-coverage  12/27/2016, 3:04 PM

## 2016-12-27 NOTE — Progress Notes (Signed)
Occupational Therapy Treatment Patient Details Name: Michelle Herrera MRN: [id number] DOB: Jul 05, 1930 Today's Date: 12/27/2016    History of present illness  Patient is a 83 year old female history of atrial fibrillation on chronic anticoagulation with Coumadin, coronary artery disease, history of CVA, hypertension, mitral valve regurgitation, diabetes mellitus presented to the ED with worsening lower extremity edema, paroxysmal nocturnal dyspnea, shortness of breath on minimal exertion, cough productive of thick whitish sputum.  And also noted to have not taken her diuretics 4-5 days prior to admission.  Workup on admission concerning for an acute CHF exacerbation.   OT comments  Pt will need 24/7 A at home and A with ADL activity   Follow Up Recommendations  Home health OT;Supervision/Assistance - 24 hour(depending on A at home)    Equipment Recommendations  Other (comment)    Recommendations for Other Services      Precautions / Restrictions Precautions Precautions: Fall       Mobility Bed Mobility Overal bed mobility: Needs Assistance Bed Mobility: Supine to Sit Rolling: Min assist   Supine to sit: Min assist        Transfers Overall transfer level: Needs assistance Equipment used: 2 person hand held assist;1 person hand held assist Transfers: Sit to/from [facility] Sit to Stand: Mod assist Stand pivot transfers: Min guard                ADL either performed or assessed with clinical judgement   ADL Overall ADL's : Needs assistance/impaired Eating/Feeding: Set up;Sitting;Minimal assistance Eating/Feeding Details (indicate cue type and reason): pt eating a straw.  encouraged pt to use spoon and had son encourage her to use a spooon but she continued to use a straw.                       Toilet Transfer: Moderate assistance;BSC;RW;Comfort height toilet   Toileting- Clothing Manipulation and Hygiene: Maximal assistance;Sit to/from  stand;Cueing for sequencing;Cueing for safety Toileting - Clothing Manipulation Details (indicate cue type and reason): max A for completeness       General ADL Comments: pt will need A at home for ADL activity                Cognition Arousal/Alertness: Awake/alert Behavior During Therapy: WFL for tasks assessed/performed Overall Cognitive Status: Impaired/Different from baseline Area of Impairment: Orientation;Safety/judgement;Problem solving                         Safety/Judgement: Decreased awareness of deficits;Decreased awareness of safety     General Comments: pt following commands                   Pertinent Vitals/ Pain       Pain Assessment: No/denies pain         Frequency  Min 2X/week        Progress Toward Goals  OT Goals(current goals can now be found in the care plan section)  Progress towards OT goals: Progressing toward goals     Plan Discharge plan needs to be updated    Co-evaluation                 AM-PAC PT "6 Clicks" Daily Activity     Outcome Measure   Help from another person eating meals?: A Little Help from another person taking care of personal grooming?: A Little Help from another person toileting, which includes using toliet, bedpan, or urinal?: A Lot  Help from another person bathing (including washing, rinsing, drying)?: A Lot Help from another person to put on and taking off regular upper body clothing?: A Little Help from another person to put on and taking off regular lower body clothing?: A Lot 6 Click Score: 15    End of Session Equipment Utilized During Treatment: Rolling walker  OT Visit Diagnosis: Unsteadiness on feet (R26.81);Muscle weakness (generalized) (M62.81)   Activity Tolerance Patient tolerated treatment well   Patient Left in chair;with call bell/phone within reach;with chair alarm set   Nurse Communication Mobility status           Charges: OT General Charges $OT Visit:  1 Visit OT Treatments $Self Care/Home Management : 8-22 mins  [place], [place]   Betsy Pries 12/27/2016, 10:51 AM

## 2016-12-28 LAB — PROTIME-INR
INR: 2.14
Prothrombin Time: 23.7 seconds — ABNORMAL HIGH (ref 11.4–15.2)

## 2016-12-28 LAB — BASIC METABOLIC PANEL
Anion gap: 10 (ref 5–15)
BUN: 26 mg/dL — AB (ref 6–20)
CHLORIDE: 100 mmol/L — AB (ref 101–111)
CO2: 29 mmol/L (ref 22–32)
CREATININE: 1.15 mg/dL — AB (ref 0.44–1.00)
Calcium: 8.6 mg/dL — ABNORMAL LOW (ref 8.9–10.3)
GFR calc Af Amer: 49 mL/min — ABNORMAL LOW (ref >60)
GFR calc non Af Amer: 42 mL/min — ABNORMAL LOW (ref >60)
GLUCOSE: 130 mg/dL — AB (ref 65–99)
Potassium: 3.7 mmol/L (ref 3.5–5.1)
SODIUM: 139 mmol/L (ref 135–145)

## 2016-12-28 LAB — GLUCOSE, CAPILLARY
GLUCOSE-CAPILLARY: 208 mg/dL — AB (ref 65–99)
GLUCOSE-CAPILLARY: 138 mg/dL — AB (ref 65–99)
GLUCOSE-CAPILLARY: 133 mg/dL — AB (ref 65–99)
Glucose-Capillary: 129 mg/dL — ABNORMAL HIGH (ref 65–99)

## 2016-12-28 MED ORDER — WARFARIN SODIUM 2 MG PO TABS
2.0000 mg | ORAL_TABLET | Freq: Once | ORAL | Status: AC
  Administered 2016-12-28: 2 mg via ORAL
  Filled 2016-12-28: qty 1

## 2016-12-28 NOTE — Progress Notes (Signed)
ANTICOAGULATION CONSULT NOTE - Follow Up Consult  Pharmacy Consult for warfarin Indication: hx atrial fibrillation  Allergies  Allergen Reactions  . Peanut-Containing Drug Products Anaphylaxis and Swelling  . Atorvastatin     "MAKES HER FEEL BAD"  . Azithromycin Other (See Comments)    REACTION: pt states INTOL to ZPak  . Flomax [Tamsulosin Hcl] Swelling  . Pioglitazone Other (See Comments)    edema  . Pregabalin Other (See Comments)    REACTION: pt states INTOL to Lyrica  . Sulfonamide Derivatives Swelling    Patient Measurements: Height: 5\' 5"  (165.1 cm) Weight: 123 lb 7.3 oz (56 kg) IBW/kg (Calculated) : 57 Heparin Dosing Weight:   Vital Signs: Temp: 97.7 F (36.5 C) (12/19 0449) Temp Source: Oral (12/19 0449) BP: 108/75 (12/19 0449) Pulse Rate: 70 (12/19 0449)  Labs: Recent Labs    12/26/16 0733 12/27/16 0431 12/28/16 0352  HGB 11.8*  --   --   HCT 35.6*  --   --   PLT 221  --   --   LABPROT 27.5* 26.6* 23.7*  INR 2.58 2.48 2.14  CREATININE 1.13* 1.13* 1.15*    Estimated Creatinine Clearance: 32.2 mL/min (A) (by C-G formula based on SCr of 1.15 mg/dL (H)).   Medications:  PTA warfarin regimen: 2mg  daily and nothing on Mon and Thurs (verified with pt's son. He reported that Dr. Jeannine Kitten office called and gave him this new warfarin regimen on 12/14)  Assessment: Patient is an 83 y.o with hx chronic diastolic heart failure and afib on warfarin PTA, presented to the ED on 12/23/16 with c/o SOB.  Warfarin resumed for patient on admission.  Today, 12/28/2016:  INR continues to be therapeutic but trending down - been on home regimen since admission  CBC good  No reported bleeding  Goal of Therapy:  INR 2-3 Monitor platelets by anticoagulation protocol: Yes   Plan:  1) Will continue with patient's warfarin regimen as per home above - 2mg  today 2) Daily INR   Adrian Saran, PharmD, BCPS Pager [phone] 12/28/2016 9:50 AM

## 2016-12-28 NOTE — Progress Notes (Signed)
Progress Note  Patient Name: Michelle Herrera Date of Encounter: 12/28/2016  Primary Cardiologist: Dr. Cristopher Peru  Subjective   Denies any chest pain or SOB  Inpatient Medications    Scheduled Meds: . atenolol  50 mg Oral Daily  . diltiazem  180 mg Oral Daily  . furosemide  40 mg Intravenous TID  . insulin aspart  0-9 Units Subcutaneous TID WC  . levothyroxine  62.5 mcg Oral QAC breakfast  . sodium chloride flush  3 mL Intravenous Q12H  . warfarin  2 mg Oral ONCE-1800  . Warfarin - Pharmacist Dosing Inpatient   Does not apply q1800   Continuous Infusions: . sodium chloride     PRN Meds: sodium chloride, acetaminophen, bismuth subsalicylate, ondansetron (ZOFRAN) IV, sodium chloride flush, tetrahydrozoline   Vital Signs    Vitals:   12/27/16 0446 12/27/16 0937 12/27/16 2135 12/28/16 0449  BP: 100/73 108/65 114/70 108/75  Pulse: 69 74 65 70  Resp: 18  17 16   Temp: (!) 97.5 F (36.4 C)  97.6 F (36.4 C) 97.7 F (36.5 C)  TempSrc: Oral  Oral Oral  SpO2: 97%  92% 95%  Weight: 130 lb 1.1 oz (59 kg)   123 lb 7.3 oz (56 kg)  Height:        Intake/Output Summary (Last 24 hours) at 12/28/2016 1039 Last data filed at 12/28/2016 0600 Gross per 24 hour  Intake 30 ml  Output 900 ml  Net -870 ml   Filed Weights   12/26/16 0425 12/27/16 0446 12/28/16 0449  Weight: 127 lb 13.9 oz (58 kg) 130 lb 1.1 oz (59 kg) 123 lb 7.3 oz (56 kg)    Telemetry    Atrial fibrillation with CVR - Personally Reviewed  ECG    No new EKG to review - Personally Reviewed  Physical Exam   GEN: No acute distress.  Morbid obese Neck: No JVD Cardiac: irregularly irregular, no murmurs, rubs, or gallops.  Respiratory: Clear to auscultation bilaterally. GI: Soft, nontender, non-distended  MS: trace dependent LE edema; No deformity. Neuro:  Nonfocal  Psych: Normal affect   Labs    Chemistry Recent Labs  Lab 12/22/16 1109 12/23/16 1558  12/26/16 0733 12/27/16 0431  12/28/16 0352  NA 142 142   < > 142 139 139  K 3.4* 4.4   < > 3.7 3.7 3.7  CL 101 103   < > 102 102 100*  CO2 28 25   < > 28 25 29   GLUCOSE 161* 145*   < > 129* 133* 130*  BUN 18 23*   < > 23* 24* 26*  CREATININE 1.07 1.23*   < > 1.13* 1.13* 1.15*  CALCIUM 8.9 9.1   < > 8.8* 8.6* 8.6*  PROT 6.5 6.8  --   --   --   --   ALBUMIN 3.8 3.5  --   --   --   --   AST 14 35  --   --   --   --   ALT 9 10*  --   --   --   --   ALKPHOS 78 90  --   --   --   --   BILITOT 1.3* 1.5*  --   --   --   --   GFRNONAA  --  39*   < > 43* 43* 42*  GFRAA  --  45*   < > 50* 50* 49*  ANIONGAP  --  14   < >  12 12 10    < > = values in this interval not displayed.     Hematology Recent Labs  Lab 12/23/16 1558 12/23/16 1608 12/25/16 0558 12/26/16 0733  WBC 8.2  --  7.4 5.9  RBC 3.93  --  3.94 3.78*  HGB 12.6 14.3 12.8 11.8*  HCT 37.0 42.0 37.2 35.6*  MCV 94.1  --  94.4 94.2  MCH 32.1  --  32.5 31.2  MCHC 34.1  --  34.4 33.1  RDW 16.7*  --  16.8* 16.3*  PLT 284  --  256 221    Cardiac Enzymes Recent Labs  Lab 12/23/16 1817 12/23/16 2305 12/24/16 0409  TROPONINI 1.05* 0.93* 0.85*   No results for input(s): TROPIPOC in the last 168 hours.   BNP Recent Labs  Lab 12/22/16 1109 12/23/16 1558  BNP  --  1,582.2*  PROBNP 2,994.0*  --      DDimer No results for input(s): DDIMER in the last 168 hours.   Radiology    No results found.  Cardiac Studies   Echocardiogram 12/24/2016: Study Conclusions  - Left ventricle: The cavity size was normal. Wall thickness was normal. Systolic function was mildly reduced. The estimated ejection fraction was in the range of 45% to 50%. Diffuse hypokinesis. The study is not technically sufficient to allow evaluation of LV diastolic function. - Ventricular septum: The contour showed diastolic flattening and systolic flattening. - Aortic valve: There was trivial regurgitation. - Mitral valve: Mildly thickened leaflets with mild prolapse of  the posterior leaflet. There was moderate to severe regurgitation. Regurgitant volume (PISA): 50 ml. - Left atrium: The atrium was severely dilated. - Right ventricle: The cavity size was mildly dilated. Systolic function was mildly reduced. - Right atrium: The atrium was severely dilated. Central venous pressure (est): 15 mm Hg. - Atrial septum: A patent foramen ovale cannot be excluded. - Tricuspid valve: There was severe regurgitation. - Pulmonary arteries: PA peak pressure: 58 mm Hg (S). - Pericardium, extracardiac: There was a left pleural effusion.    Patient Profile     83 y.o. female with a history ofchronic atrial fibrillation, CAD, hypertension, previous stroke, diastolic heart failure, and pulmonary hypertension.She presents with acute on chronic combined heart failure  Assessment & Plan    1.Acute on chronic combinedsystolic/diastolicheart failure.LVEF 45-50% by follow-up echocardiogram. She also has evidence of mild RV dysfunction with pulmonary hypertension, PA SP 58 mmHg and severe tricuspid regurgitation which is more chronic.  - pulmonary HTN likely Group 2 secondary to pulmonary venous HTN from CHF. - she put out 1.1L yesterday and is net neg 3.1L.  - weight down 7lbs. - creatinine fairly stable - she still has LE edema  - continue  Lasix to 40mg  IV TID x 1 more day and change to PO tomorrow - not sure how much the MR is playing a role in her CHF - compression hose were ordered but are not on  2.Chronic atrial fibrillation- HR well controlled in 80-90's. - continue Atenolol 50mg  daily and Cardizem CD 180mg  daily - continue warfarin per pharmacy - INR therapeutic at 2.14.  3.Troponin I elevation and flat pattern, peak 1.05.Still suspect secondary to demand ischemia with heart failure rather than definitive ACS. She does not report active chest pain.  4.CADwith history of BMS to the RCA in 2014 and recurrentNSTEMI in 2015 in the setting of  nonobstructive CAD. - no ASA due to warfarin - continue BB   For questions or updates, please contact [facility] Please consult [email or web address] for contact info under Cardiology/STEMI.      Signed, Fransico Him, MD  12/28/2016, 10:39 AM

## 2016-12-28 NOTE — Progress Notes (Signed)
PROGRESS NOTE    Michelle Herrera  YBW:[id number] DOB: Dec 29, 1930 DOA: 12/23/2016 PCP: Noralee Space, MD   Brief Narrative:   Patient is a 83 year old female history of atrial fibrillation on chronic anticoagulation with Coumadin, coronary artery disease, history of CVA, hypertension, mitral valve regurgitation, diabetes mellitus presented to the ED with worsening lower extremity edema, paroxysmal nocturnal dyspnea, shortness of breath on minimal exertion, cough productive of thick whitish sputum.  And also noted to have not taken her diuretics 4-5 days prior to admission.  Workup on admission concerning for an acute CHF exacerbation.  Patient admitted placed on IV Lasix and diuretics.  Cardiology consulted.   Assessment & Plan:   Acute on chronic combined systolic and diastolic heart failure -History of poor compliance with diuretics recently -Echo with EF of 45-50% also has pulmonary hypertension and severe tricuspid regurgitation -On IV Lasix, continue for one more day, BP soft still has 1+ lower extremity edema which could be partly from venous stasis -Cardiology following -Weight seems inaccurate, monitor I/O closely she is net -3 L -bmet in am    Hypertension Continue atenolol and Cardizem.   Atrial fibrillation In sinus rhythm, continue atenolol and Cardizem -Continue Coumadin for anticoagulation per pharmacy.   Hematuria -Urinalysis with RBCs.  Urine cultures negative. H&H stable.  -Urine clearing, monitor   Diabetes mellitus II Hemoglobin A1c was 6.9.  CBGs ranging from 79-138. Continue to hold oral hypoglycemic agents.  Sliding scale insulin.  Hypothyroidism TSH of 6.902.  Increased home dose Synthroid to 62.5 MCG's daily.  Will need repeat thyroid function studies done in 4-6 weeks.  Outpatient follow-up with PCP.     Memory loss/probable early dementia Currently stable.  Follow.  DVT prophylaxis: Coumadin Code Status: DNR Family Communication: Updated  patient.  No family at bedside. Disposition Plan:Home with home health services tomorrow if stable  Consultants:   Cardiology: Dr. Domenic Polite 12/24/2016  Procedures:   2D echo 12/24/2016   Chest x-ray 12/23/2016    Antimicrobials:   None   Subjective: -feels ok, breathing better  Objective: Vitals:   12/27/16 0446 12/27/16 0937 12/27/16 2135 12/28/16 0449  BP: 100/73 108/65 114/70 108/75  Pulse: 69 74 65 70  Resp: 18  17 16   Temp: (!) 97.5 F (36.4 C)  97.6 F (36.4 C) 97.7 F (36.5 C)  TempSrc: Oral  Oral Oral  SpO2: 97%  92% 95%  Weight: 59 kg (130 lb 1.1 oz)   56 kg (123 lb 7.3 oz)  Height:        Intake/Output Summary (Last 24 hours) at 12/28/2016 1349 Last data filed at 12/28/2016 0600 Gross per 24 hour  Intake 30 ml  Output 900 ml  Net -870 ml   Filed Weights   12/26/16 0425 12/27/16 0446 12/28/16 0449  Weight: 58 kg (127 lb 13.9 oz) 59 kg (130 lb 1.1 oz) 56 kg (123 lb 7.3 oz)    Examination:  Gen: Awake, Alert, Oriented X 2, no distress HEENT: PERRLA, Neck supple, no JVD Lungs: Clear today CVS: RRR,No Gallops,Rubs or new Murmurs Abd: soft, Non tender, non distended, BS present Extremities: 1plus ankle edema Skin: no new rashes  Data Reviewed: I have personally reviewed following labs and imaging studies  CBC: Recent Labs  Lab 12/22/16 1109 12/23/16 1558 12/23/16 1608 12/25/16 0558 12/26/16 0733  WBC 6.5 8.2  --  7.4 5.9  NEUTROABS 5.5 6.8  --   --   --   HGB 12.9 12.6 14.3 12.8  11.8*  HCT 39.3 37.0 42.0 37.2 35.6*  MCV 97.1 94.1  --  94.4 94.2  PLT 290.0 284  --  256 326   Basic Metabolic Panel: Recent Labs  Lab 12/23/16 1822 12/24/16 0409 12/25/16 0558 12/25/16 0954 12/26/16 0733 12/27/16 0431 12/28/16 0352  NA  --  140 142  --  142 139 139  K  --  4.1 5.9* 4.0 3.7 3.7 3.7  CL  --  105 105  --  102 102 100*  CO2  --  22 24  --  28 25 29   GLUCOSE  --  144* 87  --  129* 133* 130*  BUN  --  22* 23*  --  23* 24* 26*    CREATININE  --  1.13* 1.15*  --  1.13* 1.13* 1.15*  CALCIUM  --  8.9 9.1  --  8.8* 8.6* 8.6*  MG 1.3* 2.5*  --   --   --   --   --    GFR: Estimated Creatinine Clearance: 32.2 mL/min (A) (by C-G formula based on SCr of 1.15 mg/dL (H)). Liver Function Tests: Recent Labs  Lab 12/22/16 1109 12/23/16 1558  AST 14 35  ALT 9 10*  ALKPHOS 78 90  BILITOT 1.3* 1.5*  PROT 6.5 6.8  ALBUMIN 3.8 3.5   No results for input(s): LIPASE, AMYLASE in the last 168 hours. No results for input(s): AMMONIA in the last 168 hours. Coagulation Profile: Recent Labs  Lab 12/24/16 0409 12/25/16 0831 12/26/16 0733 12/27/16 0431 12/28/16 0352  INR 2.46 2.48 2.58 2.48 2.14   Cardiac Enzymes: Recent Labs  Lab 12/23/16 1817 12/23/16 2305 12/24/16 0409  TROPONINI 1.05* 0.93* 0.85*   BNP (last 3 results) Recent Labs    12/22/16 1109  PROBNP 2,994.0*   HbA1C: No results for input(s): HGBA1C in the last 72 hours. CBG: Recent Labs  Lab 12/27/16 1201 12/27/16 1720 12/27/16 2131 12/28/16 0758 12/28/16 1202  GLUCAP 193* 165* 159* 129* 208*   Lipid Profile: No results for input(s): CHOL, HDL, LDLCALC, TRIG, CHOLHDL, LDLDIRECT in the last 72 hours. Thyroid Function Tests: No results for input(s): TSH, T4TOTAL, FREET4, T3FREE, THYROIDAB in the last 72 hours. Anemia Panel: No results for input(s): VITAMINB12, FOLATE, FERRITIN, TIBC, IRON, RETICCTPCT in the last 72 hours. Sepsis Labs: No results for input(s): PROCALCITON, LATICACIDVEN in the last 168 hours.  Recent Results (from the past 240 hour(s))  Urine Culture     Status: None   Collection Time: 12/22/16  9:26 AM  Result Value Ref Range Status   MICRO NUMBER: [id number]  Final   SPECIMEN QUALITY: ADEQUATE  Final   Sample Source NOT GIVEN  Final   STATUS: FINAL  Final   Result:   Final    Three or more organisms present, each greater than 10,000 cu/mL. May represent normal flora contamination from external genitalia. No further testing  is required.  Culture, Urine     Status: Abnormal   Collection Time: 12/24/16  1:06 AM  Result Value Ref Range Status   Specimen Description URINE, CLEAN CATCH  Final   Special Requests NONE  Final   Culture MULTIPLE SPECIES PRESENT, SUGGEST RECOLLECTION (A)  Final   Report Status 12/25/2016 FINAL  Final         Radiology Studies: No results found.      Scheduled Meds: . atenolol  50 mg Oral Daily  . diltiazem  180 mg Oral Daily  . furosemide  40 mg Intravenous  TID  . insulin aspart  0-9 Units Subcutaneous TID WC  . levothyroxine  62.5 mcg Oral QAC breakfast  . sodium chloride flush  3 mL Intravenous Q12H  . warfarin  2 mg Oral ONCE-1800  . Warfarin - Pharmacist Dosing Inpatient   Does not apply q1800   Continuous Infusions: . sodium chloride       LOS: 5 days    Time spent: 35 minutes    Domenic Polite, MD [facility] Page via [email or web address] password [email or web address]  If 7PM-7AM, please contact night-coverage  12/28/2016, 1:49 PM

## 2016-12-28 NOTE — Progress Notes (Signed)
Physical Therapy Treatment Patient Details Name: Michelle Herrera MRN: [id number] DOB: 1930-03-10 Today's Date: 12/28/2016    History of Present Illness  Patient is a 83 year old female history of atrial fibrillation on chronic anticoagulation with Coumadin, coronary artery disease, history of CVA, hypertension, mitral valve regurgitation, diabetes mellitus presented to the ED with worsening lower extremity edema, paroxysmal nocturnal dyspnea, shortness of breath on minimal exertion, cough productive of thick whitish sputum.  And also noted to have not taken her diuretics 4-5 days prior to admission.  Workup on admission concerning for an acute CHF exacerbation.    PT Comments    Pt tolerated increased activity level today, she ambulated 20' in room with RW and min/guard assist for balance.   Follow Up Recommendations  Home health PT;Supervision/Assistance - 24 hour     Equipment Recommendations  None recommended by PT    Recommendations for Other Services       Precautions / Restrictions Precautions Precautions: Fall Restrictions Weight Bearing Restrictions: No    Mobility  Bed Mobility Overal bed mobility: Needs Assistance Bed Mobility: Supine to Sit     Supine to sit: Min assist        Transfers Overall transfer level: Needs assistance Equipment used: Rolling walker (2 wheeled) Transfers: Sit to/from Stand Sit to Stand: Mod assist         General transfer comment: assist to rise and steady, pt held out her hands for HHA despite RW placement, cues to hold RW upon standing  Ambulation/Gait Ambulation/Gait assistance: Min guard Ambulation Distance (Feet): 24 Feet Assistive device: Rolling walker (2 wheeled) Gait Pattern/deviations: Step-through pattern;Decreased stride length;Trunk flexed   Gait velocity interpretation: Below normal speed for age/gender General Gait Details: VCs for posture and positioning in RW, distance limited by fatigue, no loss of  balance   Stairs            Wheelchair Mobility    Modified Rankin (Stroke Patients Only)       Balance Overall balance assessment: Needs assistance   Sitting balance-Leahy Scale: Fair     Standing balance support: Bilateral upper extremity supported Standing balance-Leahy Scale: Poor                              Cognition Arousal/Alertness: Awake/alert Behavior During Therapy: WFL for tasks assessed/performed Overall Cognitive Status: No family/caregiver present to determine baseline cognitive functioning                                 General Comments: pt following commands      Exercises      General Comments        Pertinent Vitals/Pain Pain Assessment: No/denies pain    Home Living                      Prior Function            PT Goals (current goals can now be found in the care plan section) Acute Rehab PT Goals Patient Stated Goal: None stated PT Goal Formulation: Patient unable to participate in goal setting Time For Goal Achievement: 12/31/16 Potential to Achieve Goals: Good Progress towards PT goals: Progressing toward goals    Frequency    Min 3X/week      PT Plan Current plan remains appropriate    Co-evaluation  AM-PAC PT "6 Clicks" Daily Activity  Outcome Measure  Difficulty turning over in bed (including adjusting bedclothes, sheets and blankets)?: A Lot Difficulty moving from lying on back to sitting on the side of the bed? : A Lot Difficulty sitting down on and standing up from a chair with arms (e.g., wheelchair, bedside commode, etc,.)?: Unable Help needed moving to and from a bed to chair (including a wheelchair)?: A Little Help needed walking in hospital room?: A Little Help needed climbing 3-5 steps with a railing? : A Lot 6 Click Score: 13    End of Session Equipment Utilized During Treatment: Gait belt Activity Tolerance: Patient limited by  fatigue Patient left: with call bell/phone within reach;in chair;with chair alarm set Nurse Communication: Mobility status PT Visit Diagnosis: Difficulty in walking, not elsewhere classified (R26.2);Muscle weakness (generalized) (M62.81)     Time: 5929-2446 PT Time Calculation (min) (ACUTE ONLY): 20 min  Charges:  $Gait Training: 8-22 mins                    G Codes:          [name] 12/28/2016, 1:54 PM [phone]

## 2016-12-29 ENCOUNTER — Telehealth: Payer: Self-pay | Admitting: Pulmonary Disease

## 2016-12-29 ENCOUNTER — Telehealth: Payer: Self-pay | Admitting: *Deleted

## 2016-12-29 LAB — CBC
HCT: 37.4 % (ref 36.0–46.0)
Hemoglobin: 12.3 g/dL (ref 12.0–15.0)
MCH: 31.2 pg (ref 26.0–34.0)
MCHC: 32.9 g/dL (ref 30.0–36.0)
MCV: 94.9 fL (ref 78.0–100.0)
Platelets: 236 10*3/uL (ref 150–400)
RBC: 3.94 MIL/uL (ref 3.87–5.11)
RDW: 15.8 % — AB (ref 11.5–15.5)
WBC: 5.8 10*3/uL (ref 4.0–10.5)

## 2016-12-29 LAB — BASIC METABOLIC PANEL
Anion gap: 10 (ref 5–15)
BUN: 25 mg/dL — AB (ref 6–20)
CALCIUM: 8.7 mg/dL — AB (ref 8.9–10.3)
CO2: 29 mmol/L (ref 22–32)
CREATININE: 1.1 mg/dL — AB (ref 0.44–1.00)
Chloride: 99 mmol/L — ABNORMAL LOW (ref 101–111)
GFR calc non Af Amer: 45 mL/min — ABNORMAL LOW (ref >60)
GFR, EST AFRICAN AMERICAN: 52 mL/min — AB (ref >60)
Glucose, Bld: 134 mg/dL — ABNORMAL HIGH (ref 65–99)
Potassium: 3.4 mmol/L — ABNORMAL LOW (ref 3.5–5.1)
SODIUM: 138 mmol/L (ref 135–145)

## 2016-12-29 LAB — PROTIME-INR
INR: 1.85
PROTHROMBIN TIME: 21.2 s — AB (ref 11.4–15.2)

## 2016-12-29 LAB — GLUCOSE, CAPILLARY
GLUCOSE-CAPILLARY: 134 mg/dL — AB (ref 65–99)
GLUCOSE-CAPILLARY: 185 mg/dL — AB (ref 65–99)
Glucose-Capillary: 196 mg/dL — ABNORMAL HIGH (ref 65–99)

## 2016-12-29 MED ORDER — CEFAZOLIN SODIUM-DEXTROSE 1-4 GM/50ML-% IV SOLN
1.0000 g | Freq: Once | INTRAVENOUS | Status: AC
  Administered 2016-12-29: 1 g via INTRAVENOUS
  Filled 2016-12-29: qty 50

## 2016-12-29 MED ORDER — WARFARIN SODIUM 3 MG PO TABS
3.0000 mg | ORAL_TABLET | Freq: Once | ORAL | Status: AC
  Administered 2016-12-29: 3 mg via ORAL
  Filled 2016-12-29: qty 1

## 2016-12-29 NOTE — Progress Notes (Addendum)
Progress Note  Patient Name: Michelle Herrera Date of Encounter: 12/29/2016  Primary Cardiologist: Dr. Cristopher Peru  Subjective   She denies any chest pain or SOB  Inpatient Medications    Scheduled Meds: . atenolol  50 mg Oral Daily  . diltiazem  180 mg Oral Daily  . insulin aspart  0-9 Units Subcutaneous TID WC  . levothyroxine  62.5 mcg Oral QAC breakfast  . sodium chloride flush  3 mL Intravenous Q12H  . warfarin  3 mg Oral ONCE-1800  . Warfarin - Pharmacist Dosing Inpatient   Does not apply q1800   Continuous Infusions: . sodium chloride     PRN Meds: sodium chloride, acetaminophen, bismuth subsalicylate, ondansetron (ZOFRAN) IV, sodium chloride flush, tetrahydrozoline   Vital Signs    Vitals:   12/29/16 0523 12/29/16 0757 12/29/16 0758 12/29/16 1034  BP:  (!) 103/53  115/67  Pulse:  75    Resp:      Temp:      TempSrc:      SpO2:  (!) 87% 93%   Weight: 126 lb 1.7 oz (57.2 kg)     Height:        Intake/Output Summary (Last 24 hours) at 12/29/2016 1101 Last data filed at 12/29/2016 1058 Gross per 24 hour  Intake 120 ml  Output 1150 ml  Net -1030 ml   Filed Weights   12/27/16 0446 12/28/16 0449 12/29/16 0523  Weight: 130 lb 1.1 oz (59 kg) 123 lb 7.3 oz (56 kg) 126 lb 1.7 oz (57.2 kg)    Telemetry    Atrial fibrillation with CVR - Personally Reviewed  ECG    No new EKG to review - Personally Reviewed  Physical Exam   GEN: No acute distress.   Neck: No JVD Cardiac: irregularly irregular, no murmurs, rubs, or gallops.  Respiratory: Clear to auscultation bilaterally. GI: Soft, nontender, non-distended  MS: No edema; No deformity. Neuro:  Nonfocal  Psych: Normal affect   Labs    Chemistry Recent Labs  Lab 12/22/16 1109 12/23/16 1558  12/27/16 0431 12/28/16 0352 12/29/16 0401  NA 142 142   < > 139 139 138  K 3.4* 4.4   < > 3.7 3.7 3.4*  CL 101 103   < > 102 100* 99*  CO2 28 25   < > 25 29 29   GLUCOSE 161* 145*   < > 133* 130*  134*  BUN 18 23*   < > 24* 26* 25*  CREATININE 1.07 1.23*   < > 1.13* 1.15* 1.10*  CALCIUM 8.9 9.1   < > 8.6* 8.6* 8.7*  PROT 6.5 6.8  --   --   --   --   ALBUMIN 3.8 3.5  --   --   --   --   AST 14 35  --   --   --   --   ALT 9 10*  --   --   --   --   ALKPHOS 78 90  --   --   --   --   BILITOT 1.3* 1.5*  --   --   --   --   GFRNONAA  --  39*   < > 43* 42* 45*  GFRAA  --  45*   < > 50* 49* 52*  ANIONGAP  --  14   < > 12 10 10    < > = values in this interval not displayed.     Hematology  Recent Labs  Lab 12/25/16 0558 12/26/16 0733 12/29/16 0401  WBC 7.4 5.9 5.8  RBC 3.94 3.78* 3.94  HGB 12.8 11.8* 12.3  HCT 37.2 35.6* 37.4  MCV 94.4 94.2 94.9  MCH 32.5 31.2 31.2  MCHC 34.4 33.1 32.9  RDW 16.8* 16.3* 15.8*  PLT 256 221 236    Cardiac Enzymes Recent Labs  Lab 12/23/16 1817 12/23/16 2305 12/24/16 0409  TROPONINI 1.05* 0.93* 0.85*   No results for input(s): TROPIPOC in the last 168 hours.   BNP Recent Labs  Lab 12/22/16 1109 12/23/16 1558  BNP  --  1,582.2*  PROBNP 2,994.0*  --      DDimer No results for input(s): DDIMER in the last 168 hours.   Radiology    No results found.  Cardiac Studies   Echocardiogram 12/24/2016: Study Conclusions  - Left ventricle: The cavity size was normal. Wall thickness was normal. Systolic function was mildly reduced. The estimated ejection fraction was in the range of 45% to 50%. Diffuse hypokinesis. The study is not technically sufficient to allow evaluation of LV diastolic function. - Ventricular septum: The contour showed diastolic flattening and systolic flattening. - Aortic valve: There was trivial regurgitation. - Mitral valve: Mildly thickened leaflets with mild prolapse of the posterior leaflet. There was moderate to severe regurgitation. Regurgitant volume (PISA): 50 ml. - Left atrium: The atrium was severely dilated. - Right ventricle: The cavity size was mildly dilated.  Systolic function was mildly reduced. - Right atrium: The atrium was severely dilated. Central venous pressure (est): 15 mm Hg. - Atrial septum: A patent foramen ovale cannot be excluded. - Tricuspid valve: There was severe regurgitation. - Pulmonary arteries: PA peak pressure: 58 mm Hg (S). - Pericardium, extracardiac: There was a left pleural effusion.    Patient Profile     83 y.o. female with a history ofchronic atrial fibrillation, CAD, hypertension, previous stroke, diastolic heart failure, and pulmonary hypertension.She presents with acute on chronic combined heart failure   Assessment & Plan    1.Acute on chronic combinedsystolic/diastolicheart failure.LVEF 45-50% by follow-up echocardiogram. She also has evidence of mild RV dysfunction with pulmonary hypertension, PA SP 58 mmHg and severe tricuspid regurgitation which is more chronic.  - pulmonary HTN likely Group 2 secondary to pulmonary venous HTN from CHF. - she put out 550cc yesterday and is net neg 4.2L.  - weight down 4lbs from admit (she gained 3lbs overnight but suspect inaccurate). - creatinine stable at 1.1 - not sure how much the MR is playing a role in her CHF but she is not a candidate for MV repair due to advanced age, DNR status and comorbidities - compression hose now on with no LE edema - would send out on Lasix 20mg  daily  2.Chronic atrial fibrillation- HR well controlled in 70-80's. - continue Atenolol 50mg  daily and Cardizem CD 180mg  daily - continue warfarin per pharmacy - INR subtherapeutic at1.85 - pharmacy managing  3.Troponin I elevation and flat pattern, peak 1.05.Still suspect secondary to demand ischemia with heart failure rather than definitive ACS. She does not report active chest pain.  Since she has not had any CP and is DNR would not pursue further ischemic workup at this time.  4.CADwith history of BMS to the RCA in 2014 and recurrentNSTEMI in 2015 in the setting of  nonobstructive CAD. - no ASA due to warfarin - continue BB  No other recs at this time.  Will sign off.  Call with any questions. We will have  patient followup with Dr. Lovena Le as outpt.  For questions or updates, please contact [place] Please consult [email or web address] for contact info under Cardiology/STEMI.      Signed, Fransico Him, MD  12/29/2016, 11:01 AM

## 2016-12-29 NOTE — Progress Notes (Signed)
[place] for warfarin Indication: hx atrial fibrillation  Allergies  Allergen Reactions  . Peanut-Containing Drug Products Anaphylaxis and Swelling  . Atorvastatin     "MAKES HER FEEL BAD"  . Azithromycin Other (See Comments)    REACTION: pt states INTOL to ZPak  . Flomax [Tamsulosin Hcl] Swelling  . Pioglitazone Other (See Comments)    edema  . Pregabalin Other (See Comments)    REACTION: pt states INTOL to Lyrica  . Sulfonamide Derivatives Swelling   Patient Measurements: Height: 5\' 5"  (165.1 cm) Weight: 126 lb 1.7 oz (57.2 kg) IBW/kg (Calculated) : 57  Vital Signs: Temp: 97.4 F (36.3 C) (12/20 0447) Temp Source: Oral (12/20 0447) BP: 103/53 (12/20 0757) Pulse Rate: 75 (12/20 0757)  Labs: Recent Labs    12/27/16 0431 12/28/16 0352 12/29/16 0401  HGB  --   --  12.3  HCT  --   --  37.4  PLT  --   --  236  LABPROT 26.6* 23.7* 21.2*  INR 2.48 2.14 1.85  CREATININE 1.13* 1.15* 1.10*   Estimated Creatinine Clearance: 34.3 mL/min (A) (by C-G formula based on SCr of 1.1 mg/dL (H)).  Medications:  PTA warfarin regimen: 2mg  daily, except Mon and Thurs (verified with pt's son. He reported that Dr. Jeannine Kitten office called and gave him this new warfarin regimen on 12/14)  Assessment: Patient is an 83 y.o with hx chronic diastolic heart failure and afib on warfarin PTA, presented to the ED on 12/23/16 with c/o SOB.  Warfarin resumed for patient on admission.  Today, 12/29/2016:  INR below therapeutic range, 1.85  CBC good  No reported bleeding  Goal of Therapy:  INR 2-3 Monitor platelets by anticoagulation protocol: Yes   Plan:   Warfarin 3mg  today  Daily INR  Minda Ditto PharmD Pager [phone] 12/29/2016, 9:48 AM

## 2016-12-29 NOTE — Progress Notes (Addendum)
Occupational Therapy Treatment Patient Details Name: Michelle Herrera MRN: [id number] DOB: Dec 13, 1930 Today's Date: 12/29/2016    History of present illness  Patient is a 83 year old female history of atrial fibrillation on chronic anticoagulation with Coumadin, coronary artery disease, history of CVA, hypertension, mitral valve regurgitation, diabetes mellitus presented to the ED with worsening lower extremity edema, paroxysmal nocturnal dyspnea, shortness of breath on minimal exertion, cough productive of thick whitish sputum.  And also noted to have not taken her diuretics 4-5 days prior to admission.  Workup on admission concerning for an acute CHF exacerbation.   OT comments  Pt progressing towards acute goals. Focus of session was functional transfers/mobility. Cognition, balance, strength, and activity tolerance impairments still impacting ADLs. D/c recommendations still appropriate.   Follow Up Recommendations  Home health OT;Supervision/Assistance - 24 hour    Equipment Recommendations  Other (comment)    Recommendations for Other Services      Precautions / Restrictions Precautions Precautions: Fall Restrictions Weight Bearing Restrictions: No       Mobility Bed Mobility Overal bed mobility: Needs Assistance Bed Mobility: Supine to Sit     Supine to sit: Min assist;HOB elevated     General bed mobility comments: pt reaching for assist from therapist to powerup trunk to EOB.  Transfers Overall transfer level: Needs assistance Equipment used: Rolling walker (2 wheeled) Transfers: Sit to/from Stand Sit to Stand: Min assist         General transfer comment: Assist to steady. cues for technique with rw.     Balance Overall balance assessment: Needs assistance Sitting-balance support: Bilateral upper extremity supported;No upper extremity supported;Feet supported Sitting balance-Leahy Scale: Fair     Standing balance support: Bilateral upper extremity  supported Standing balance-Leahy Scale: Poor Standing balance comment: trunk flexion. When asked pt reports she cannot stand up straighter.                           ADL either performed or assessed with clinical judgement   ADL Overall ADL's : Needs assistance/impaired                                     Functional mobility during ADLs: Min guard;Minimal assistance;Rolling walker General ADL Comments: pt will need A at home for ADL activity. Bed mobility, in-room functional mobility and transferred to recliner simulating BSC. Cues for ssafety and technique with rw.      Vision       Perception     Praxis      Cognition Arousal/Alertness: Awake/alert Behavior During Therapy: Flat affect Overall Cognitive Status: No family/caregiver present to determine baseline cognitive functioning Area of Impairment: Orientation;Safety/judgement;Problem solving                 Orientation Level: Time;Situation("[place]")       Safety/Judgement: Decreased awareness of deficits;Decreased awareness of safety   Problem Solving: Slow processing;Requires verbal cues General Comments: pt following commands        Exercises     Shoulder Instructions       General Comments  Pt noted to remove nasal cannula, NT placed back on.     Pertinent Vitals/ Pain       Pain Assessment: Faces Faces Pain Scale: Hurts a little bit  Home Living  Prior Functioning/Environment              Frequency  Min 2X/week        Progress Toward Goals  OT Goals(current goals can now be found in the care plan section)  Progress towards OT goals: Progressing toward goals  Acute Rehab OT Goals Patient Stated Goal: None stated ADL Goals Pt Will Perform Grooming: with supervision;standing Pt Will Perform Upper Body Dressing: with supervision;standing;sitting Pt Will Perform Lower Body Dressing: sit  to/from stand;with supervision Pt Will Transfer to Toilet: with supervision;ambulating;regular height toilet Pt Will Perform Toileting - Clothing Manipulation and hygiene: with supervision;sit to/from stand  Plan Discharge plan needs to be updated    Co-evaluation                 AM-PAC PT "6 Clicks" Daily Activity     Outcome Measure   Help from another person eating meals?: A Little Help from another person taking care of personal grooming?: A Little Help from another person toileting, which includes using toliet, bedpan, or urinal?: A Lot Help from another person bathing (including washing, rinsing, drying)?: A Lot Help from another person to put on and taking off regular upper body clothing?: A Little Help from another person to put on and taking off regular lower body clothing?: A Lot 6 Click Score: 15    End of Session Equipment Utilized During Treatment: Rolling walker  OT Visit Diagnosis: Unsteadiness on feet (R26.81);Muscle weakness (generalized) (M62.81)   Activity Tolerance Patient tolerated treatment well   Patient Left in chair;with call bell/phone within reach;with chair alarm set   Nurse Communication          Time: [phone] OT Time Calculation (min): 20 min  Charges: OT General Charges $OT Visit: 1 Visit OT Treatments $Self Care/Home Management : 8-22 mins     Michelle Herrera 12/29/2016, 10:18 AM

## 2016-12-29 NOTE — Progress Notes (Signed)
PROGRESS NOTE    Michelle Herrera  HLK:[id number] DOB: [birth year] DOA: 12/23/2016 PCP: Noralee Space, MD   Brief Narrative:   Patient is a 83 year old female history of atrial fibrillation on chronic anticoagulation with Coumadin, coronary artery disease, history of CVA, hypertension, mitral valve regurgitation, diabetes mellitus presented to the ED with worsening lower extremity edema, paroxysmal nocturnal dyspnea, shortness of breath on minimal exertion, cough productive of thick whitish sputum.  And also noted to have not taken her diuretics 4-5 days prior to admission.  Workup on admission concerning for an acute CHF exacerbation.  Patient admitted placed on IV Lasix and diuretics.  Cardiology consulted.   Assessment & Plan:   Acute on chronic combined systolic and diastolic heart failure -History of poor compliance with diuretics recently -Echo with EF of 45-50% also has pulmonary hypertension and severe tricuspid regurgitation -diuresed with IV lasix, negtaive 3.7L -BP soft today, stop lasix and continue BB and Cardizem with holding parameters -Cardiology following -home tomorrow on PO lasix as BP tolerates  Elevated troponin -due to demand ischemia, flat trend, ECHO EF 45-50% and diffuse hypokinesis -no plan for ischemia workup due to advanced age in absence of symptoms   Hypertension -bp soft today, stop IV lasix -continue atenolol and Cardizem as BP tolerates   Atrial fibrillation In sinus rhythm, continue atenolol and Cardizem -Continue Coumadin for anticoagulation per pharmacy.   Hematuria -Urinalysis with RBCs.  Urine cultures negative. H&H stable.  -Urine clearing, monitor   Diabetes mellitus II Hemoglobin A1c was 6.9.  CBGs ranging from 79-138. Continue to hold oral hypoglycemic agents.  Sliding scale insulin.  Hypothyroidism TSH of 6.902.  Increased home dose Synthroid to 62.5 MCG's daily.  Will need repeat thyroid function studies done in 4-6 weeks.   Outpatient follow-up with PCP.     Memory loss/probable early dementia Currently stable.  Follow.  DVT prophylaxis: Coumadin Code Status: DNR Family Communication: Updated patient.  No family at bedside. Disposition Plan:Home with home health services tomorrow if stable  Consultants:   Cardiology: Dr. Domenic Polite 12/24/2016  Procedures:   2D echo 12/24/2016   Chest x-ray 12/23/2016    Antimicrobials:   None   Subjective: -feels ok, breathing continues to improve, BP soft today  Objective: Vitals:   12/29/16 0757 12/29/16 0758 12/29/16 1034 12/29/16 1148  BP: (!) 103/53  115/67 121/66  Pulse: 75   72  Resp:      Temp:      TempSrc:      SpO2: (!) 87% 93%    Weight:      Height:        Intake/Output Summary (Last 24 hours) at 12/29/2016 1419 Last data filed at 12/29/2016 1058 Gross per 24 hour  Intake 120 ml  Output 1150 ml  Net -1030 ml   Filed Weights   12/27/16 0446 12/28/16 0449 12/29/16 0523  Weight: 59 kg (130 lb 1.1 oz) 56 kg (123 lb 7.3 oz) 57.2 kg (126 lb 1.7 oz)    Examination:  Gen: Awake, Alert, Oriented X 2 HEENT: PERRLA, Neck supple, no JVD Lungs: Good air movement bilaterally, CTAB CVS: RRR,No Gallops,Rubs or new Murmurs Abd: soft, Non tender, non distended, BS present Extremities: trace ankle edema Skin: no new rashes  Data Reviewed: I have personally reviewed following labs and imaging studies  CBC: Recent Labs  Lab 12/23/16 1558 12/23/16 1608 12/25/16 0558 12/26/16 0733 12/29/16 0401  WBC 8.2  --  7.4 5.9 5.8  NEUTROABS 6.8  --   --   --   --  HGB 12.6 14.3 12.8 11.8* 12.3  HCT 37.0 42.0 37.2 35.6* 37.4  MCV 94.1  --  94.4 94.2 94.9  PLT 284  --  256 221 267   Basic Metabolic Panel: Recent Labs  Lab 12/23/16 1822 12/24/16 0409 12/25/16 0558 12/25/16 0954 12/26/16 0733 12/27/16 0431 12/28/16 0352 12/29/16 0401  NA  --  140 142  --  142 139 139 138  K  --  4.1 5.9* 4.0 3.7 3.7 3.7 3.4*  CL  --  105 105  --   102 102 100* 99*  CO2  --  22 24  --  28 25 29 29   GLUCOSE  --  144* 87  --  129* 133* 130* 134*  BUN  --  22* 23*  --  23* 24* 26* 25*  CREATININE  --  1.13* 1.15*  --  1.13* 1.13* 1.15* 1.10*  CALCIUM  --  8.9 9.1  --  8.8* 8.6* 8.6* 8.7*  MG 1.3* 2.5*  --   --   --   --   --   --    GFR: Estimated Creatinine Clearance: 34.3 mL/min (A) (by C-G formula based on SCr of 1.1 mg/dL (H)). Liver Function Tests: Recent Labs  Lab 12/23/16 1558  AST 35  ALT 10*  ALKPHOS 90  BILITOT 1.5*  PROT 6.8  ALBUMIN 3.5   No results for input(s): LIPASE, AMYLASE in the last 168 hours. No results for input(s): AMMONIA in the last 168 hours. Coagulation Profile: Recent Labs  Lab 12/25/16 0831 12/26/16 0733 12/27/16 0431 12/28/16 0352 12/29/16 0401  INR 2.48 2.58 2.48 2.14 1.85   Cardiac Enzymes: Recent Labs  Lab 12/23/16 1817 12/23/16 2305 12/24/16 0409  TROPONINI 1.05* 0.93* 0.85*   BNP (last 3 results) Recent Labs    12/22/16 1109  PROBNP 2,994.0*   HbA1C: No results for input(s): HGBA1C in the last 72 hours. CBG: Recent Labs  Lab 12/28/16 1202 12/28/16 1626 12/28/16 2233 12/29/16 0718 12/29/16 1126  GLUCAP 208* 138* 133* 134* 185*   Lipid Profile: No results for input(s): CHOL, HDL, LDLCALC, TRIG, CHOLHDL, LDLDIRECT in the last 72 hours. Thyroid Function Tests: No results for input(s): TSH, T4TOTAL, FREET4, T3FREE, THYROIDAB in the last 72 hours. Anemia Panel: No results for input(s): VITAMINB12, FOLATE, FERRITIN, TIBC, IRON, RETICCTPCT in the last 72 hours. Sepsis Labs: No results for input(s): PROCALCITON, LATICACIDVEN in the last 168 hours.  Recent Results (from the past 240 hour(s))  Urine Culture     Status: None   Collection Time: 12/22/16  9:26 AM  Result Value Ref Range Status   MICRO NUMBER: [id number]  Final   SPECIMEN QUALITY: ADEQUATE  Final   Sample Source NOT GIVEN  Final   STATUS: FINAL  Final   Result:   Final    Three or more organisms  present, each greater than 10,000 cu/mL. May represent normal flora contamination from external genitalia. No further testing is required.  Culture, Urine     Status: Abnormal   Collection Time: 12/24/16  1:06 AM  Result Value Ref Range Status   Specimen Description URINE, CLEAN CATCH  Final   Special Requests NONE  Final   Culture MULTIPLE SPECIES PRESENT, SUGGEST RECOLLECTION (A)  Final   Report Status 12/25/2016 FINAL  Final         Radiology Studies: No results found.      Scheduled Meds: . atenolol  50 mg Oral Daily  . diltiazem  180 mg  Oral Daily  . insulin aspart  0-9 Units Subcutaneous TID WC  . levothyroxine  62.5 mcg Oral QAC breakfast  . sodium chloride flush  3 mL Intravenous Q12H  . warfarin  3 mg Oral ONCE-1800  . Warfarin - Pharmacist Dosing Inpatient   Does not apply q1800   Continuous Infusions: . sodium chloride       LOS: 6 days    Time spent: 35 minutes    Domenic Polite, MD [facility] Page via [email or web address] password [email or web address]  If 7PM-7AM, please contact night-coverage  12/29/2016, 2:19 PM

## 2016-12-29 NOTE — Telephone Encounter (Signed)
Per Dr. Lenna Gilford, because pt is still admitted will cancel OV tomorrow morning.  Called and spoke to [place], pt's son. Informed him of the appt cancellation. Shanon Brow verbalized understanding and denied any further questions or concerns at this time.

## 2016-12-30 ENCOUNTER — Ambulatory Visit: Payer: Medicare Other | Admitting: Pulmonary Disease

## 2016-12-30 ENCOUNTER — Encounter (HOSPITAL_COMMUNITY): Payer: Self-pay

## 2016-12-30 LAB — GLUCOSE, CAPILLARY
GLUCOSE-CAPILLARY: 128 mg/dL — AB (ref 65–99)
GLUCOSE-CAPILLARY: 96 mg/dL (ref 65–99)
GLUCOSE-CAPILLARY: 233 mg/dL — AB (ref 65–99)
Glucose-Capillary: 110 mg/dL — ABNORMAL HIGH (ref 65–99)
Glucose-Capillary: 106 mg/dL — ABNORMAL HIGH (ref 65–99)

## 2016-12-30 LAB — BASIC METABOLIC PANEL
ANION GAP: 12 (ref 5–15)
BUN: 26 mg/dL — ABNORMAL HIGH (ref 6–20)
CALCIUM: 8.6 mg/dL — AB (ref 8.9–10.3)
CO2: 28 mmol/L (ref 22–32)
Chloride: 100 mmol/L — ABNORMAL LOW (ref 101–111)
Creatinine, Ser: 1.13 mg/dL — ABNORMAL HIGH (ref 0.44–1.00)
GFR, EST AFRICAN AMERICAN: 50 mL/min — AB (ref >60)
GFR, EST NON AFRICAN AMERICAN: 43 mL/min — AB (ref >60)
Glucose, Bld: 102 mg/dL — ABNORMAL HIGH (ref 65–99)
POTASSIUM: 3.6 mmol/L (ref 3.5–5.1)
SODIUM: 140 mmol/L (ref 135–145)

## 2016-12-30 LAB — CBC
HCT: 35.2 % — ABNORMAL LOW (ref 36.0–46.0)
Hemoglobin: 11.8 g/dL — ABNORMAL LOW (ref 12.0–15.0)
MCH: 31.4 pg (ref 26.0–34.0)
MCHC: 33.5 g/dL (ref 30.0–36.0)
MCV: 93.6 fL (ref 78.0–100.0)
PLATELETS: 230 10*3/uL (ref 150–400)
RBC: 3.76 MIL/uL — AB (ref 3.87–5.11)
RDW: 16 % — AB (ref 11.5–15.5)
WBC: 5.4 10*3/uL (ref 4.0–10.5)

## 2016-12-30 LAB — PROTIME-INR
INR: 1.71
PROTHROMBIN TIME: 19.9 s — AB (ref 11.4–15.2)

## 2016-12-30 MED ORDER — LEVOTHYROXINE SODIUM 125 MCG PO TABS: 62.5000 ug | ORAL_TABLET | Freq: Every day | ORAL | 0 refills | Status: AC

## 2016-12-30 MED ORDER — WARFARIN SODIUM 4 MG PO TABS
4.0000 mg | ORAL_TABLET | Freq: Once | ORAL | Status: AC
  Administered 2016-12-30: 4 mg via ORAL
  Filled 2016-12-30: qty 1

## 2016-12-30 NOTE — Discharge Summary (Signed)
Physician Discharge Summary  Michelle Herrera OZH:[id number] DOB: 25-Jun-1930 DOA: 12/23/2016  PCP: Noralee Space, MD  Admit date: 12/23/2016 Discharge date: 12/30/2016  Time spent: 45 minutes  1. PCP in one week 2. Cardiology Amber Sieler NP 1/11 at 8:40am 2. Please check B met at follow-up 3. Home health PT and RN   Discharge Diagnoses:  Principal Problem:   Acute on chronic combined systolic and diastolic CHF (congestive heart failure) (HCC) Active Problems:   Essential hypertension   Memory loss   Permanent atrial fibrillation (HCC)   Diabetes mellitus with diabetic neuropathy (HCC)   Hypothyroidism   Edema   Hematuria   Discharge Condition: Stable  Diet recommendation: Diabetic low sodium heart healthy  Filed Weights   12/28/16 0449 12/29/16 0523 12/30/16 0457  Weight: 56 kg (123 lb 7.3 oz) 57.2 kg (126 lb 1.7 oz) 55.1 kg (121 lb 7.6 oz)    History of present illness:  Patient is a 82 year old female history of atrial fibrillation on chronic anticoagulation with Coumadin, coronary artery disease, history of CVA, hypertension, mitral valve regurgitation, diabetes mellitus presented to the ED with worsening lower extremity edema, paroxysmal nocturnal dyspnea, shortness of breath on minimal exertion, cough productive of thick whitish sputum.  Hospital Course:    Acute on chronic combined systolic and diastolic heart failure -History of poor compliance with diuretics recently -Echo with EF of 45-50% also has pulmonary hypertension and severe tricuspid regurgitation -diuresed with IV lasix, negtaive >4L -Transitioned to oral Lasix 20 mg daily and continue BB and Cardizem  -Followed by cardiology this admission clinically improved and felt to be close to euvolemic at discharge  Elevated troponin -due to demand ischemia, flat trend, ECHO EF 45-50% and diffuse hypokinesis -no plan for ischemia workup due to advanced age in absence of symptoms   Hypertension -Now  stable, continue beta blocker, Cardizem and low-dose Lasix  Atrial fibrillation In sinus rhythm, continue atenolol and Cardizem -ContinueCoumadin  -Needs INR recheck in one week  Hematuria -Transient, resolved   Diabetes mellitus II Hemoglobin A1c was 6.9.   - resuming metformin at discharge  Hypothyroidism TSH of 6.902.  Increased home dose Synthroid to 62.5 MCG's daily.  Will need repeat thyroid function studies done in 4-6 weeks.  Outpatient follow-up with PCP.     Memory loss/probable early dementia Currently stable.  Follow.   Code Status: DNR   Consultations:  Cardiology  Discharge Exam: Vitals:   12/30/16 0457 12/30/16 0911  BP: 110/70 (!) 108/56  Pulse: 76 77  Resp: 20 16  Temp: (!) 97.5 F (36.4 C) 97.6 F (36.4 C)  SpO2: 92% 92%    General: Alert awake oriented 3 Cardiovascular: S1-S2 regular rate rhythm Respiratory: Clear bilaterally  Discharge Instructions   Discharge Instructions    Diet - low sodium heart healthy   Complete by:  As directed    Diet Carb Modified   Complete by:  As directed    Increase activity slowly   Complete by:  As directed      Allergies as of 12/30/2016      Reactions   Peanut-containing Drug Products Anaphylaxis, Swelling   Atorvastatin    "MAKES HER FEEL BAD"   Azithromycin Other (See Comments)   REACTION: pt states INTOL to ZPak   Flomax [tamsulosin Hcl] Swelling   Pioglitazone Other (See Comments)   edema   Pregabalin Other (See Comments)   REACTION: pt states INTOL to Lyrica   Sulfonamide Derivatives Swelling  Medication List    STOP taking these medications   atorvastatin 40 MG tablet Commonly known as:  LIPITOR   ciprofloxacin 250 MG tablet Commonly known as:  CIPRO   famotidine 20 MG tablet Commonly known as:  PEPCID   LANCETS ULTRA FINE Misc   omeprazole 20 MG capsule Commonly known as:  PRILOSEC     TAKE these medications   ACCU-CHEK AVIVA PLUS w/Device Kit Test  blood sugar up to two times daily   acetaminophen 500 MG tablet Commonly known as:  TYLENOL Take 500 mg by mouth every 6 (six) hours as needed for mild pain.   atenolol 50 MG tablet Commonly known as:  TENORMIN TAKE ONE TABLET BY MOUTH ONCE DAILY   bismuth subsalicylate 030 DT/14HO suspension Commonly known as:  PEPTO BISMOL Take 30 mLs by mouth daily as needed for indigestion.   diltiazem 120 MG 24 hr capsule Commonly known as:  CARTIA XT Take 1 capsule (120 mg total) by mouth daily.   feeding supplement (ENSURE ENLIVE) Liqd Take 237 mLs by mouth 2 (two) times daily between meals.   furosemide 20 MG tablet Commonly known as:  LASIX Take 1 tablet (20 mg total) daily by mouth.   glipiZIDE 10 MG tablet Commonly known as:  GLUCOTROL Take 1 tablet (10 mg total) by mouth 2 (two) times daily.   glucose blood test strip Commonly known as:  ACCU-CHEK AVIVA PLUS Test blood sugar up to two times daily   levothyroxine 125 MCG tablet Commonly known as:  SYNTHROID, LEVOTHROID Take 0.5 tablets (62.5 mcg total) by mouth daily before breakfast. What changed:    medication strength  how much to take   metFORMIN 500 MG tablet Commonly known as:  GLUCOPHAGE Take 500 mg by mouth 2 (two) times daily with a meal.   nitroGLYCERIN 0.4 MG SL tablet Commonly known as:  NITROSTAT Place 1 tablet (0.4 mg total) under the tongue every 5 (five) minutes as needed for chest pain (up to 3 doses).   potassium chloride 10 MEQ CR capsule Commonly known as:  MICRO-K Take 2 capsules (20 mEq total) by mouth daily.   VISINE OP Place 2 drops into both eyes daily as needed (DRY EYES).   vitamin B-12 1000 MCG tablet Commonly known as:  CYANOCOBALAMIN Take 1,000 mcg by mouth every morning.   warfarin 4 MG tablet Commonly known as:  COUMADIN Take 1 tablet (4 mg total) by mouth daily. What changed:    how much to take  additional instructions      Allergies  Allergen Reactions  .  Peanut-Containing Drug Products Anaphylaxis and Swelling  . Atorvastatin     "MAKES HER FEEL BAD"  . Azithromycin Other (See Comments)    REACTION: pt states INTOL to ZPak  . Flomax [Tamsulosin Hcl] Swelling  . Pioglitazone Other (See Comments)    edema  . Pregabalin Other (See Comments)    REACTION: pt states INTOL to Lyrica  . Sulfonamide Derivatives Swelling   Follow-up Information    Evans Lance, MD Follow up.   Specialty:  Cardiology Why:  You have a cardiology hospital follow up appointment scheduled for January 11th at 8:40 am with Dr. Tanna Furry PA, Amber Sieler.  Contact information: [address] [place] [address] [phone]        Noralee Space, MD. Schedule an appointment as soon as possible for a visit in 1 week(s).   Specialty:  Pulmonary Disease Contact information: [address] [place] [address] [phone]            The results of significant diagnostics from this hospitalization (including imaging, microbiology, ancillary and laboratory) are listed below for reference.    Significant Diagnostic Studies: Dg Chest 2 View  Result Date: 12/23/2016 CLINICAL DATA:  Lower extremity swelling. EXAM: CHEST  2 VIEW COMPARISON:  12/22/2016.  02/29/2016. FINDINGS: Cardiomegaly. Bilateral effusions, larger on the left than the right, with basilar volume loss. Venous hypertension with mild interstitial edema. No acute bone finding. IMPRESSION: Fluid overload/ congestive heart failure with bilateral effusions left larger than right, dependent atelectasis, cardiomegaly and mild interstitial edema. Electronically Signed   By: Nelson Chimes M.D.   On: 12/23/2016 15:02   Dg Chest 2 View  Result Date: 12/22/2016 CLINICAL DATA:  Bilateral lower extremity swelling. EXAM: CHEST  2 VIEW COMPARISON:  Radiographs of February 29, 2016. FINDINGS: Stable cardiomegaly. Atherosclerosis of thoracic aorta is noted. No pneumothorax is noted. Interval  development of moderate left pleural effusion with probable underlying atelectasis or infiltrate. Mild right basilar atelectasis or infiltrate is noted. Bony thorax is unremarkable. IMPRESSION: Moderate left pleural effusion with probable underlying atelectasis or infiltrate. Mild right basilar atelectasis or infiltrate is noted. Electronically Signed   By: Marijo Conception, M.D.   On: 12/22/2016 13:09    Microbiology: Recent Results (from the past 240 hour(s))  Urine Culture     Status: None   Collection Time: 12/22/16  9:26 AM  Result Value Ref Range Status   MICRO NUMBER: [id number]  Final   SPECIMEN QUALITY: ADEQUATE  Final   Sample Source NOT GIVEN  Final   STATUS: FINAL  Final   Result:   Final    Three or more organisms present, each greater than 10,000 cu/mL. May represent normal flora contamination from external genitalia. No further testing is required.  Culture, Urine     Status: Abnormal   Collection Time: 12/24/16  1:06 AM  Result Value Ref Range Status   Specimen Description URINE, CLEAN CATCH  Final   Special Requests NONE  Final   Culture MULTIPLE SPECIES PRESENT, SUGGEST RECOLLECTION (A)  Final   Report Status 12/25/2016 FINAL  Final     Labs: Basic Metabolic Panel: Recent Labs  Lab 12/23/16 1822 12/24/16 0409  12/26/16 0733 12/27/16 0431 12/28/16 0352 12/29/16 0401 12/30/16 0448  NA  --  140   < > 142 139 139 138 140  K  --  4.1   < > 3.7 3.7 3.7 3.4* 3.6  CL  --  105   < > 102 102 100* 99* 100*  CO2  --  22   < > _0 GLUCOSE  --  144*   < > 129* 133* 130* 134* 102*  BUN  --  22*   < > 23* 24* 26* 25* 26*  CREATININE  --  1.13*   < > 1.13* 1.13* 1.15* 1.10* 1.13*  CALCIUM  --  8.9   < > 8.8* 8.6* 8.6* 8.7* 8.6*  MG 1.3* 2.5*  --   --   --   --   --   --    < > = values in this interval not displayed.   Liver Function Tests: Recent Labs  Lab 12/23/16 1558  AST 35  ALT 10*  ALKPHOS 90  BILITOT 1.5*  PROT 6.8  ALBUMIN 3.5   No results  for input(s): LIPASE, AMYLASE in the  last 168 hours. No results for input(s): AMMONIA in the last 168 hours. CBC: Recent Labs  Lab 12/23/16 1558 12/23/16 1608 12/25/16 0558 12/26/16 0733 12/29/16 0401 12/30/16 0448  WBC 8.2  --  7.4 5.9 5.8 5.4  NEUTROABS 6.8  --   --   --   --   --   HGB 12.6 14.3 12.8 11.8* 12.3 11.8*  HCT 37.0 42.0 37.2 35.6* 37.4 35.2*  MCV 94.1  --  94.4 94.2 94.9 93.6  PLT 284  --  256 221 236 230   Cardiac Enzymes: Recent Labs  Lab 12/23/16 1817 12/23/16 2305 12/24/16 0409  TROPONINI 1.05* 0.93* 0.85*   BNP: BNP (last 3 results) Recent Labs    12/23/16 1558  BNP 1,582.2*    ProBNP (last 3 results) Recent Labs    12/22/16 1109  PROBNP 2,994.0*    CBG: Recent Labs  Lab 12/29/16 1632 12/29/16 2247 12/30/16 0723 12/30/16 0924 12/30/16 1152  GLUCAP 196* 128* 110* 96 106*       Signed:  Domenic Polite MD.  [facility] 12/30/2016, 12:47 PM

## 2016-12-30 NOTE — Telephone Encounter (Signed)
No message, will close encounter.

## 2016-12-30 NOTE — Progress Notes (Signed)
Physical Therapy Treatment Patient Details Name: Michelle Herrera MRN: [id number] DOB: 06/16/1930 Today's Date: 12/30/2016    History of Present Illness  Patient is a 83 year old female history of atrial fibrillation on chronic anticoagulation with Coumadin, coronary artery disease, history of CVA, hypertension, mitral valve regurgitation, diabetes mellitus presented to the ED with worsening lower extremity edema, paroxysmal nocturnal dyspnea, shortness of breath on minimal exertion, cough productive of thick whitish sputum.  And also noted to have not taken her diuretics 4-5 days prior to admission.  Workup on admission concerning for an acute CHF exacerbation.    PT Comments    Pt ambulated 70' with RW and min/guard assist, distance limited by fatigue. Pt oriented to self and location but not to month/year.   Follow Up Recommendations  Home health PT;Supervision/Assistance - 24 hour     Equipment Recommendations  None recommended by PT    Recommendations for Other Services       Precautions / Restrictions Precautions Precautions: Fall Restrictions Weight Bearing Restrictions: No    Mobility  Bed Mobility Overal bed mobility: Needs Assistance Bed Mobility: Supine to Sit     Supine to sit: Min assist;HOB elevated     General bed mobility comments: pt reaching for assist from therapist to power up trunk to EOB.  Transfers Overall transfer level: Needs assistance Equipment used: Rolling walker (2 wheeled) Transfers: Sit to/from Stand Sit to Stand: Min assist         General transfer comment: Assist to steady. cues for technique with rw.   Ambulation/Gait Ambulation/Gait assistance: Min guard Ambulation Distance (Feet): 26 Feet Assistive device: Rolling walker (2 wheeled) Gait Pattern/deviations: Step-through pattern;Decreased stride length;Trunk flexed   Gait velocity interpretation: Below normal speed for age/gender General Gait Details: VCs for posture and  positioning in RW, distance limited by fatigue, no loss of balance   Stairs            Wheelchair Mobility    Modified Rankin (Stroke Patients Only)       Balance Overall balance assessment: Needs assistance Sitting-balance support: Bilateral upper extremity supported;No upper extremity supported;Feet supported Sitting balance-Leahy Scale: Fair     Standing balance support: Bilateral upper extremity supported Standing balance-Leahy Scale: Poor Standing balance comment: flexed posture, can partially correct                            Cognition Arousal/Alertness: Awake/alert Behavior During Therapy: Flat affect Overall Cognitive Status: No family/caregiver present to determine baseline cognitive functioning Area of Impairment: Orientation;Safety/judgement;Problem solving                 Orientation Level: Time;Situation("[place]")       Safety/Judgement: Decreased awareness of deficits;Decreased awareness of safety   Problem Solving: Slow processing;Requires verbal cues General Comments: pt following commands, oriented to self and location, not to month/year      Exercises      General Comments        Pertinent Vitals/Pain Pain Assessment: No/denies pain    Home Living                      Prior Function            PT Goals (current goals can now be found in the care plan section) Acute Rehab PT Goals Patient Stated Goal: None stated PT Goal Formulation: With patient Time For Goal Achievement: 12/31/16 Potential to Achieve Goals: Good Progress  towards PT goals: Progressing toward goals    Frequency    Min 3X/week      PT Plan Current plan remains appropriate    Co-evaluation              AM-PAC PT "6 Clicks" Daily Activity  Outcome Measure  Difficulty turning over in bed (including adjusting bedclothes, sheets and blankets)?: A Lot Difficulty moving from lying on back to sitting on the side of the  bed? : A Lot Difficulty sitting down on and standing up from a chair with arms (e.g., wheelchair, bedside commode, etc,.)?: Unable Help needed moving to and from a bed to chair (including a wheelchair)?: A Little Help needed walking in hospital room?: A Little Help needed climbing 3-5 steps with a railing? : A Lot 6 Click Score: 13    End of Session Equipment Utilized During Treatment: Gait belt Activity Tolerance: Patient limited by fatigue Patient left: with call bell/phone within reach;in chair;with chair alarm set Nurse Communication: Mobility status PT Visit Diagnosis: Difficulty in walking, not elsewhere classified (R26.2);Muscle weakness (generalized) (M62.81)     Time: 5498-2641 PT Time Calculation (min) (ACUTE ONLY): 18 min  Charges:  $Gait Training: 8-22 mins                    G Codes:          Michelle Herrera 12/30/2016, 12:22 PM [phone]

## 2016-12-30 NOTE — Progress Notes (Signed)
Michelle Herrera for warfarin Indication: hx atrial fibrillation  Allergies  Allergen Reactions  . Peanut-Containing Drug Products Anaphylaxis and Swelling  . Atorvastatin     "MAKES HER FEEL BAD"  . Azithromycin Other (See Comments)    REACTION: pt states INTOL to ZPak  . Flomax [Tamsulosin Hcl] Swelling  . Pioglitazone Other (See Comments)    edema  . Pregabalin Other (See Comments)    REACTION: pt states INTOL to Lyrica  . Sulfonamide Derivatives Swelling   Patient Measurements: Height: 5\' 5"  (165.1 cm) Weight: 121 lb 7.6 oz (55.1 kg) IBW/kg (Calculated) : 57  Vital Signs: Temp: 97.5 F (36.4 C) (12/21 0457) Temp Source: Oral (12/21 0457) BP: 110/70 (12/21 0457) Pulse Rate: 76 (12/21 0457)  Labs: Recent Labs    12/28/16 0352 12/29/16 0401 12/30/16 0448  HGB  --  12.3 11.8*  HCT  --  37.4 35.2*  PLT  --  236 230  LABPROT 23.7* 21.2* 19.9*  INR 2.14 1.85 1.71  CREATININE 1.15* 1.10* 1.13*   Estimated Creatinine Clearance: 32.2 mL/min (A) (by C-G formula based on SCr of 1.13 mg/dL (H)).  Medications:  PTA warfarin regimen: 2mg  daily, except Mon and Thurs (verified with pt's son. He reported that Dr. Jeannine Kitten office called and gave him this new warfarin regimen on 12/14)  Assessment: Patient is an 83 y.o with hx chronic diastolic heart failure and afib on warfarin PTA, presented to the ED on 12/23/16 with c/o SOB.  Warfarin resumed for patient on admission.  Today, 12/30/2016:  INR below therapeutic range and decreased from yesterday, 1.71  CBC stable  No reported bleeding  Goal of Therapy:  INR 2-3 Monitor platelets by anticoagulation protocol: Yes   Plan:   Warfarin 4mg  today  Daily INR  Minda Ditto PharmD Pager [phone] 12/30/2016, 7:26 AM

## 2017-01-02 ENCOUNTER — Telehealth: Payer: Self-pay | Admitting: Pulmonary Disease

## 2017-01-04 ENCOUNTER — Telehealth: Payer: Self-pay | Admitting: Pulmonary Disease

## 2017-01-05 NOTE — Telephone Encounter (Signed)
SN is aware. 

## 2017-01-05 NOTE — Telephone Encounter (Signed)
Will forward to SN to make him aware.

## 2017-01-10 NOTE — Telephone Encounter (Signed)
Called pt's son, Shanon Brow and scheduled pt for a HFU with SN 01/17/16 at 10:00. Shanon Brow expressed understanding. Nothing further needed.

## 2017-01-10 DEATH — deceased

## 2017-01-11 ENCOUNTER — Telehealth: Payer: Self-pay

## 2017-01-11 NOTE — Telephone Encounter (Signed)
On 01/11/17 I received a d/c from [place] (original). The d/c is for burial. The patient is a patient of Doctor Lenna Gilford. The d/c will be taken to Pulmonary Unit @ [facility] this am for signature.  On 01/11/17 I received the d/c back from Doctor Lenna Gilford. I got the d/c ready and called the funeral home to let them know the d/c was mailed to vital records per the funeral home request.

## 2017-01-16 ENCOUNTER — Inpatient Hospital Stay: Payer: Medicare Other | Admitting: Pulmonary Disease

## 2017-01-20 ENCOUNTER — Ambulatory Visit: Payer: Medicare Other | Admitting: Nurse Practitioner

## 2018-04-02 IMAGING — CR DG CHEST 2V
2 series · 2 of 2 positions shown · non-contrast
Comparison: 04/24/2014

CLINICAL DATA: Epigastric abdominal pain for 3 days.

EXAM:
CHEST  2 VIEW

[w chest pa]
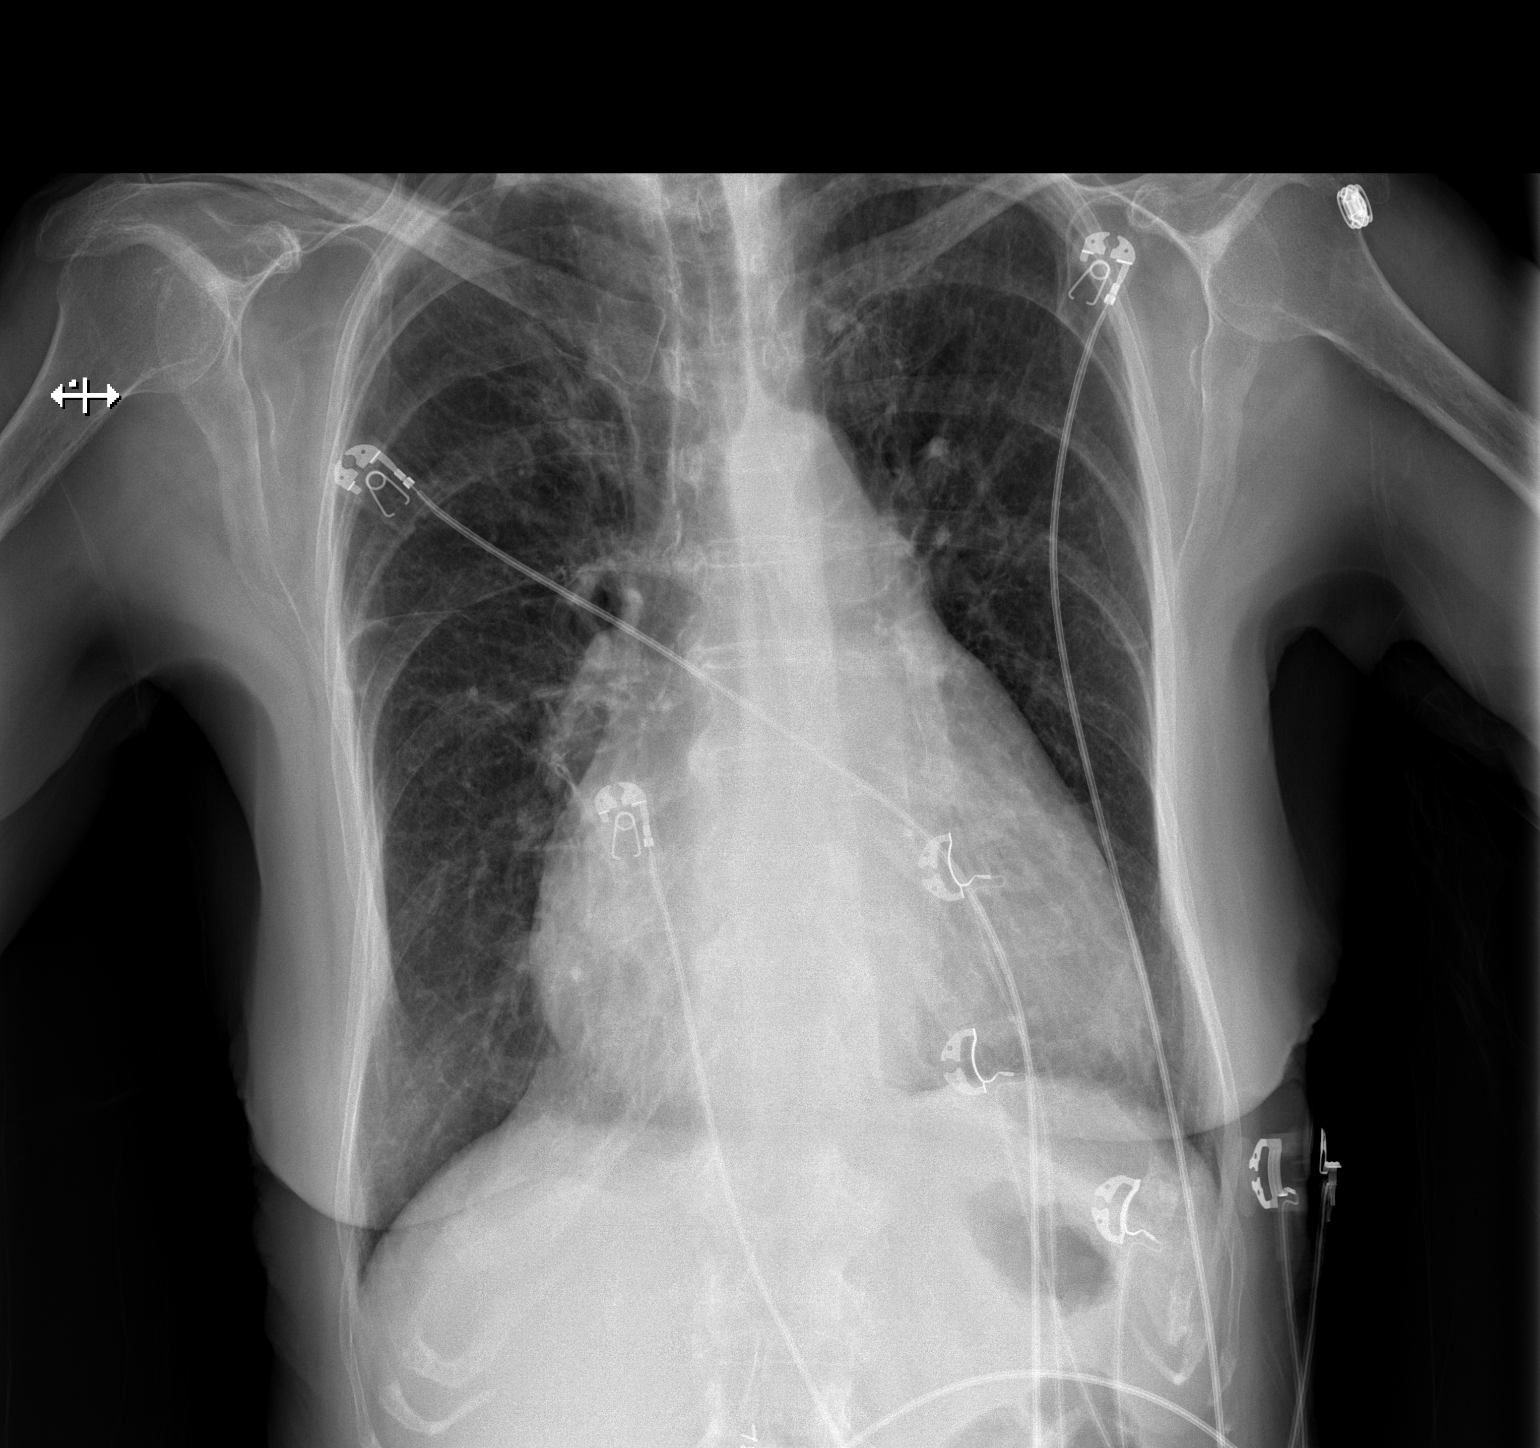

[w chest lat]
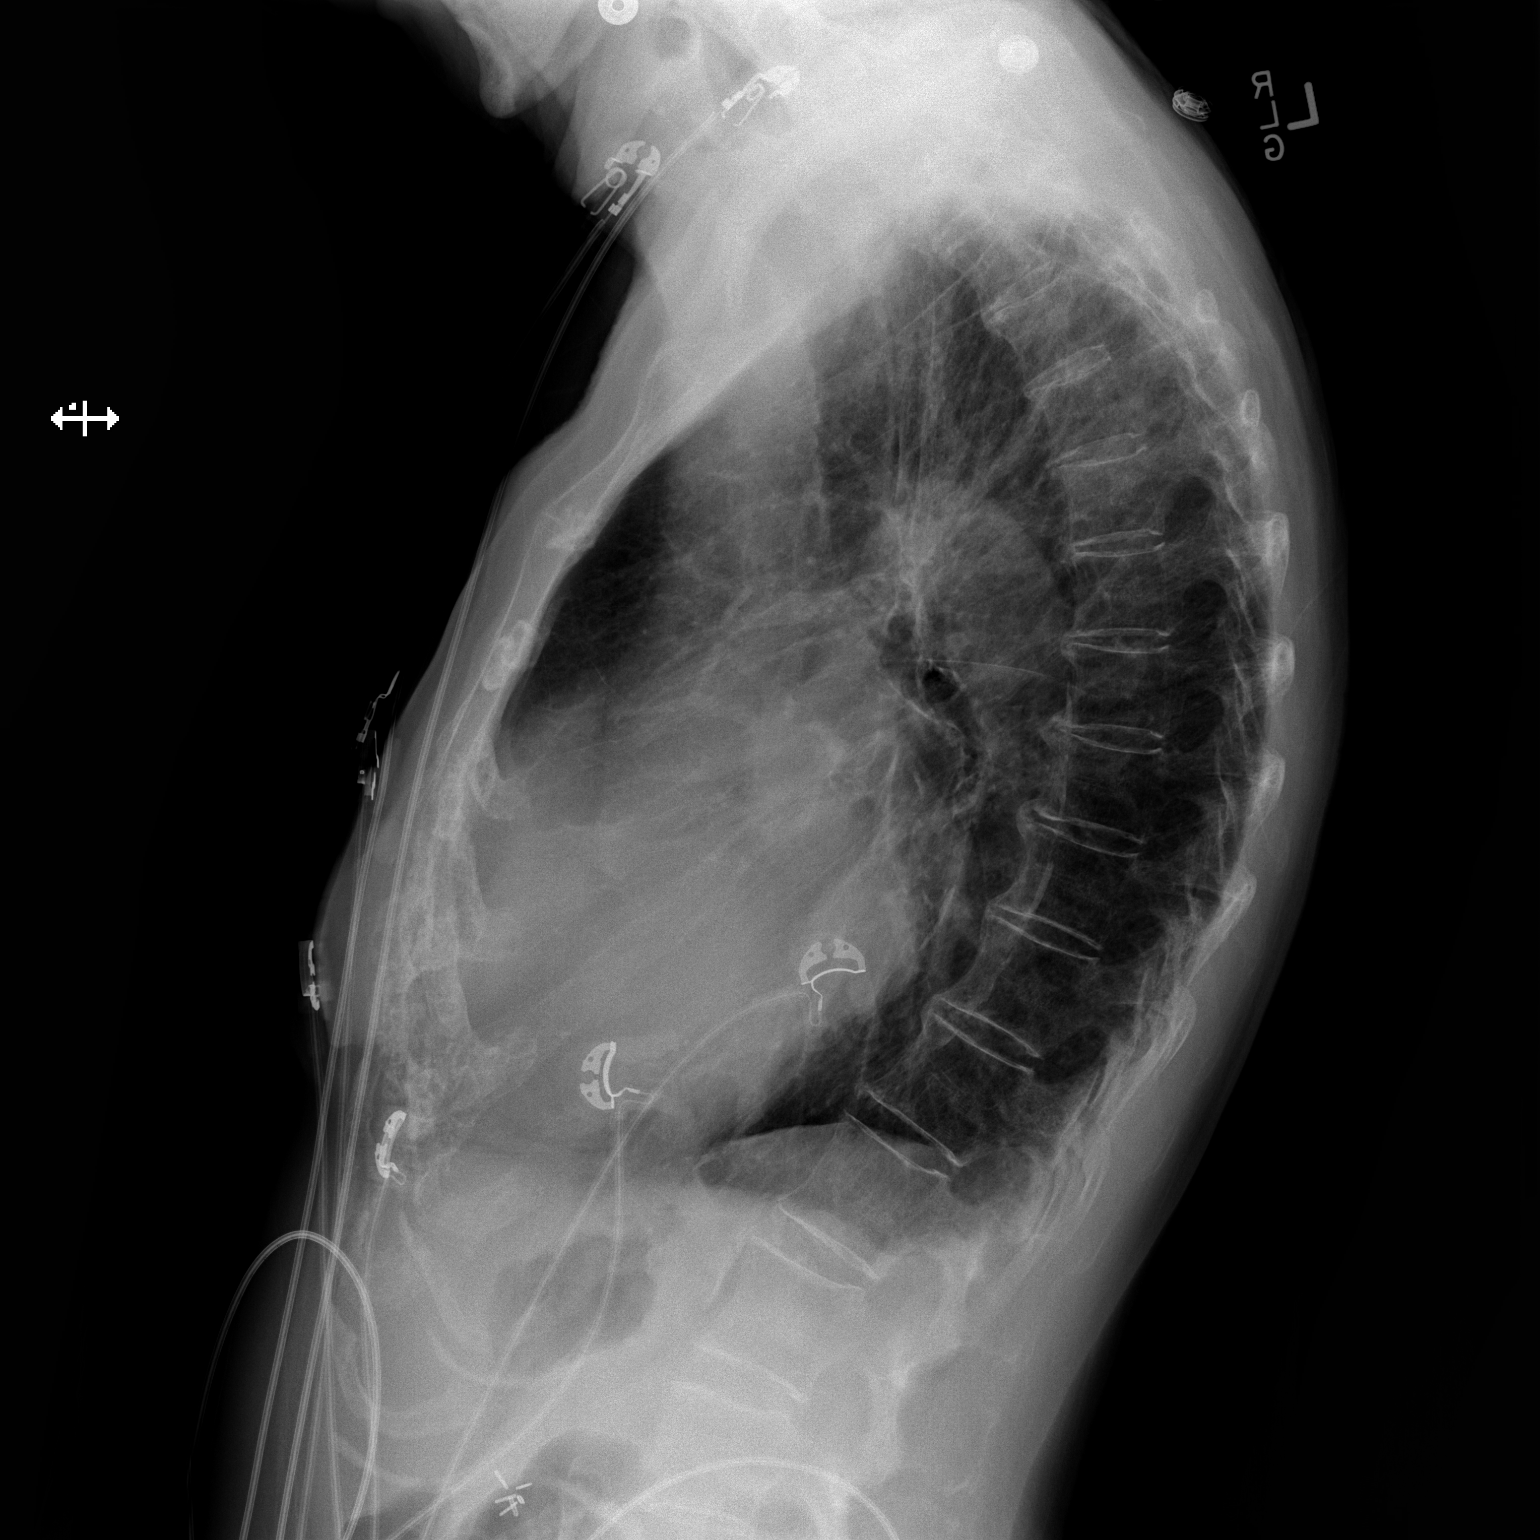

[2 of 2 positions shown; findings below may reference images not displayed]

FINDINGS: The heart is enlarged but stable. Stable tortuosity of the thoracic
aorta. There are chronic lung changes and emphysema but no acute
overlying pulmonary findings. The bony thorax is intact.
IMPRESSION: Stable cardiac enlargement and chronic lung changes with emphysema
but no definite acute overlying pulmonary process.

## 2019-01-25 IMAGING — CR DG CHEST 2V
3 series · 3 of 3 positions shown · non-contrast
Comparison: 12/22/2016.  02/29/2016.

CLINICAL DATA: Lower extremity swelling.

EXAM:
CHEST  2 VIEW

[w chest lat]
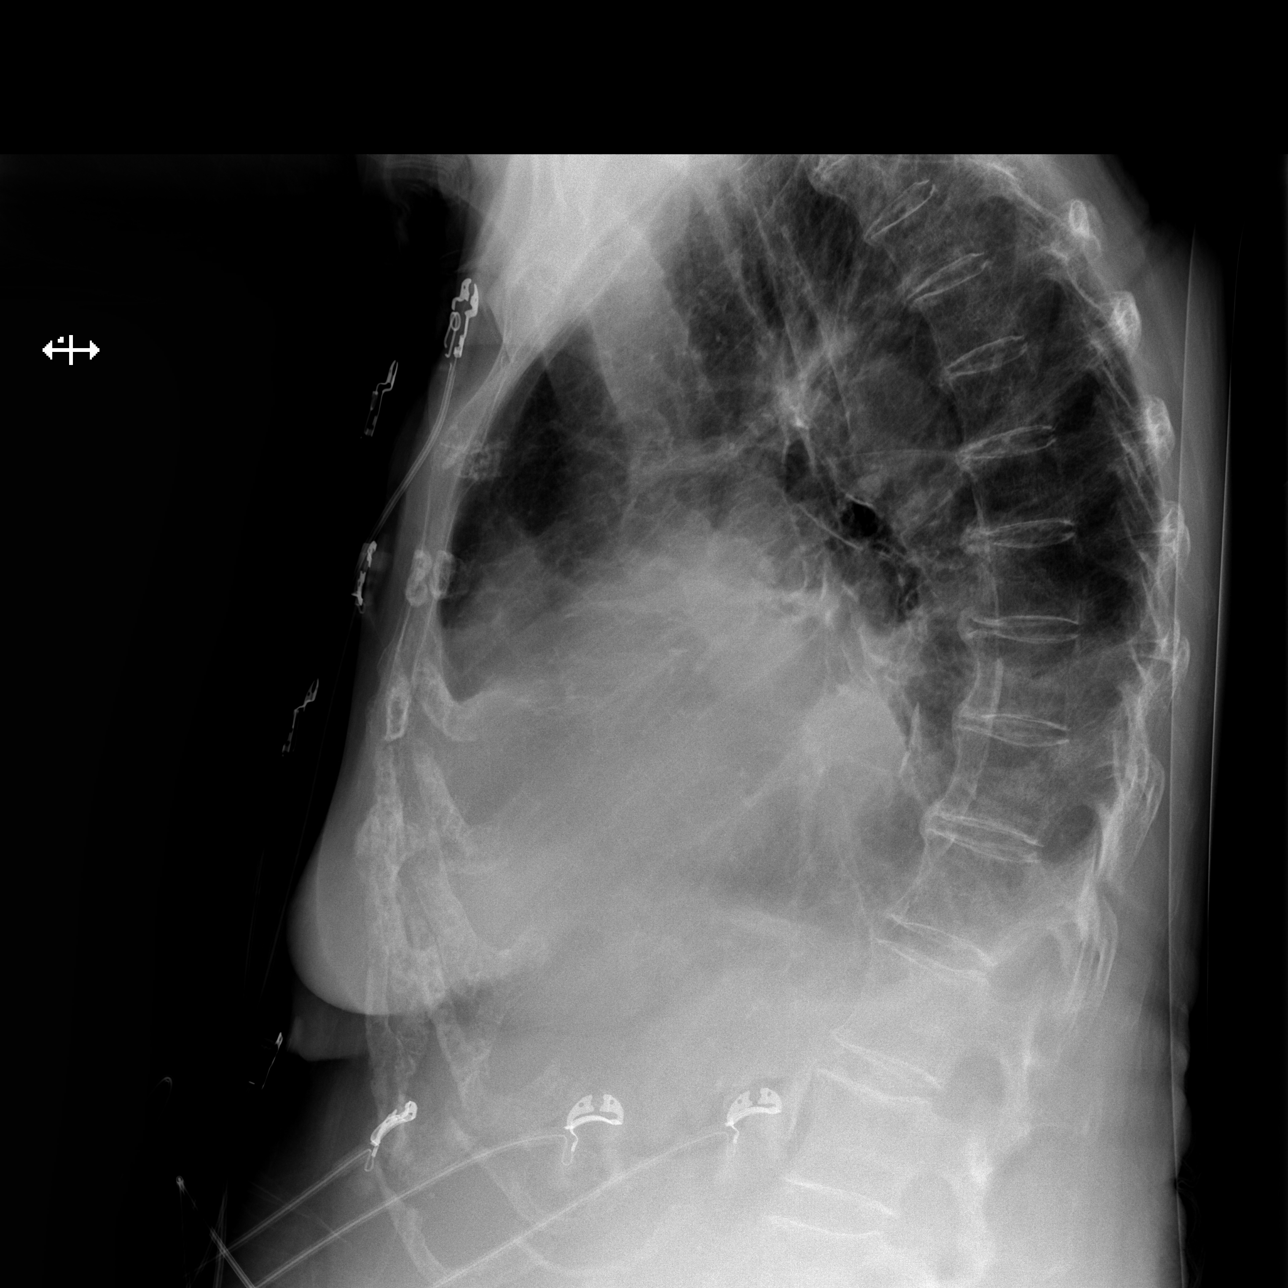

[x chest ap (1 of 2)]
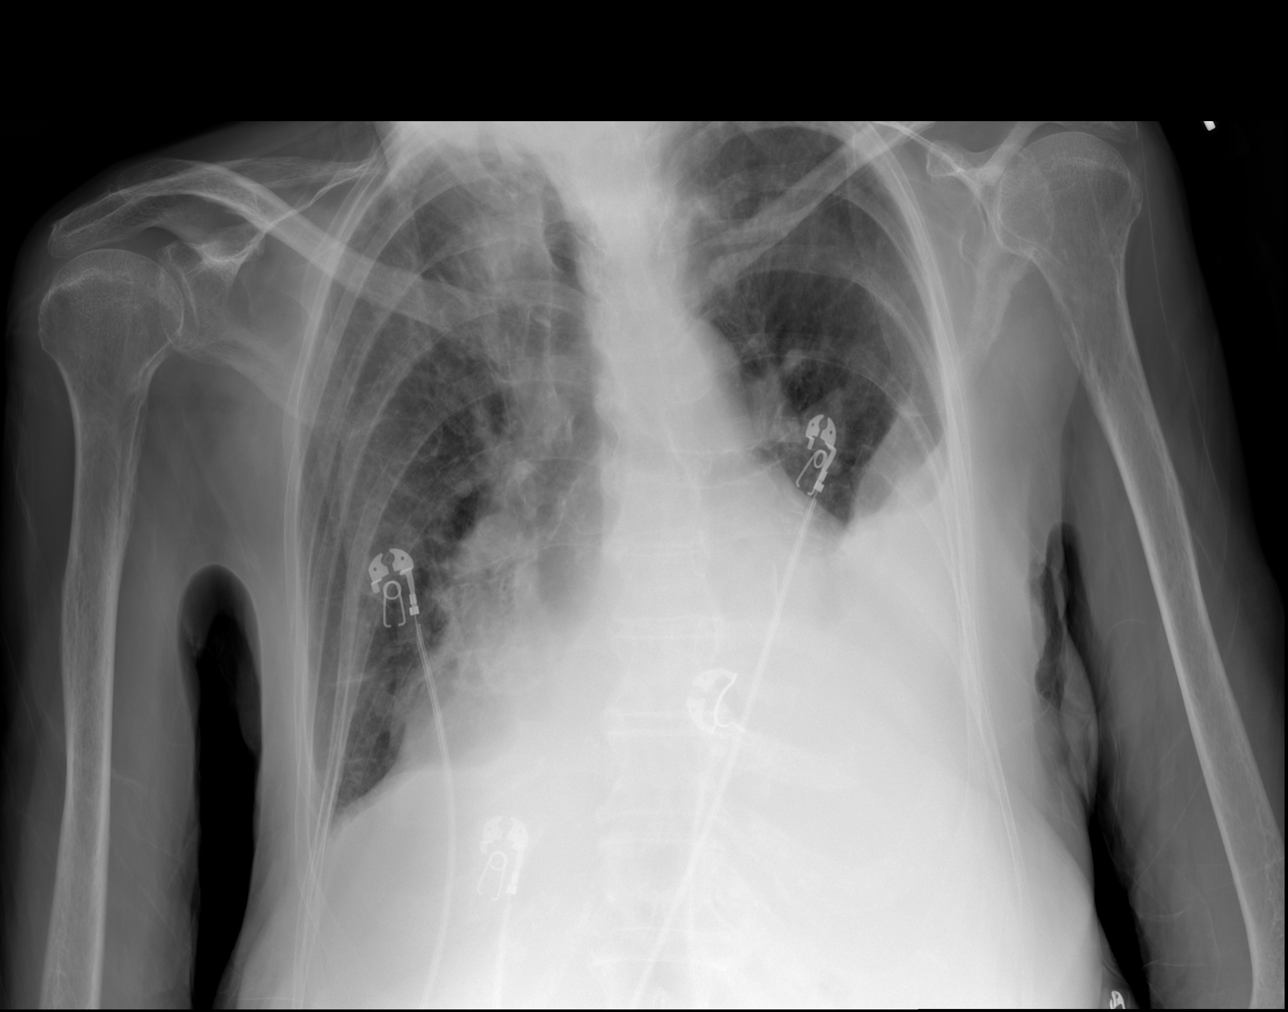

[x chest ap (2 of 2)]
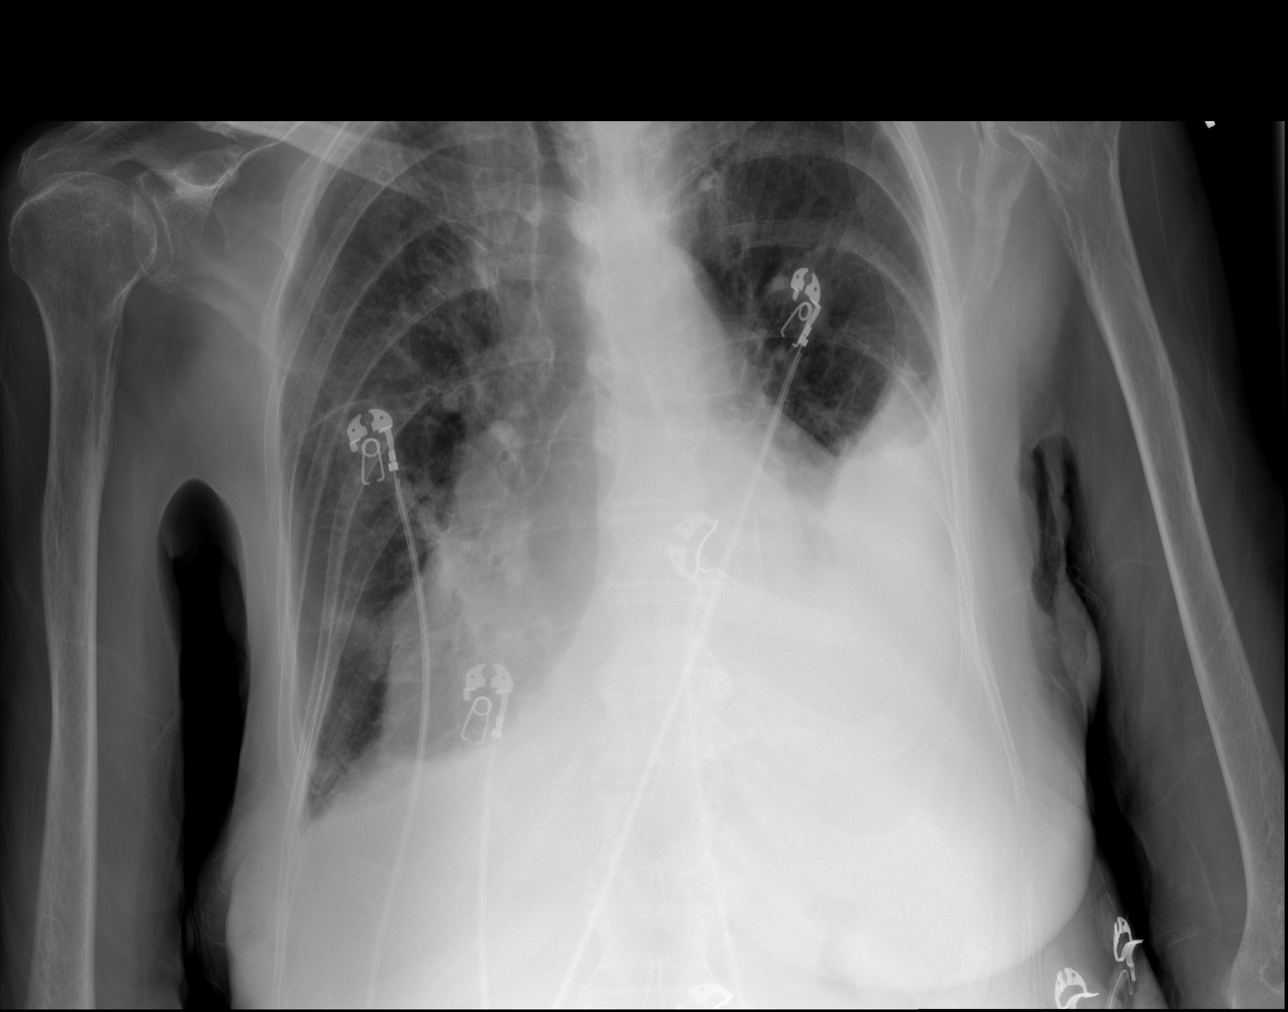

[3 of 3 positions shown; findings below may reference images not displayed]

FINDINGS: Cardiomegaly. Bilateral effusions, larger on the left than the
right, with basilar volume loss. Venous hypertension with mild
interstitial edema. No acute bone finding.
IMPRESSION: Fluid overload/ congestive heart failure with bilateral effusions
left larger than right, dependent atelectasis, cardiomegaly and mild
interstitial edema.
